# Patient Record
Sex: Female | Born: 1950
Health system: Southern US, Community
[De-identification: ages and names within clinical notes are randomized; demographics above are authoritative.]

## PROBLEM LIST (undated history)

## (undated) DIAGNOSIS — N3946 Mixed incontinence: Secondary | ICD-10-CM

## (undated) DIAGNOSIS — K259 Gastric ulcer, unspecified as acute or chronic, without hemorrhage or perforation: Secondary | ICD-10-CM

## (undated) DIAGNOSIS — R1310 Dysphagia, unspecified: Secondary | ICD-10-CM

## (undated) DIAGNOSIS — F419 Anxiety disorder, unspecified: Secondary | ICD-10-CM

## (undated) DIAGNOSIS — R011 Cardiac murmur, unspecified: Secondary | ICD-10-CM

## (undated) DIAGNOSIS — M4800 Spinal stenosis, site unspecified: Secondary | ICD-10-CM

## (undated) DIAGNOSIS — M1990 Unspecified osteoarthritis, unspecified site: Secondary | ICD-10-CM

## (undated) DIAGNOSIS — Z5189 Encounter for other specified aftercare: Secondary | ICD-10-CM

## (undated) DIAGNOSIS — E7800 Pure hypercholesterolemia, unspecified: Secondary | ICD-10-CM

## (undated) DIAGNOSIS — M5430 Sciatica, unspecified side: Secondary | ICD-10-CM

## (undated) DIAGNOSIS — E785 Hyperlipidemia, unspecified: Secondary | ICD-10-CM

## (undated) DIAGNOSIS — K219 Gastro-esophageal reflux disease without esophagitis: Secondary | ICD-10-CM

## (undated) DIAGNOSIS — I10 Essential (primary) hypertension: Secondary | ICD-10-CM

## (undated) HISTORY — PX: EYE SURGERY: SHX253

## (undated) HISTORY — PX: JOINT REPLACEMENT: SHX530

## (undated) HISTORY — PX: TOTAL HIP ARTHROPLASTY: SHX124

## (undated) HISTORY — DX: Sciatica, unspecified side: M54.30

## (undated) HISTORY — DX: Pure hypercholesterolemia, unspecified: E78.00

## (undated) HISTORY — PX: CARPAL TUNNEL RELEASE: SHX101

## (undated) HISTORY — DX: Hyperlipidemia, unspecified: E78.5

## (undated) HISTORY — DX: Dysphagia, unspecified: R13.10

## (undated) HISTORY — PX: OTHER SURGICAL HISTORY: SHX169

## (undated) HISTORY — DX: Mixed incontinence: N39.46

## (undated) HISTORY — DX: Encounter for other specified aftercare: Z51.89

## (undated) HISTORY — DX: Unspecified osteoarthritis, unspecified site: M19.90

---

## 1980-03-13 DIAGNOSIS — Z5189 Encounter for other specified aftercare: Secondary | ICD-10-CM

## 1980-03-13 HISTORY — DX: Encounter for other specified aftercare: Z51.89

## 1999-01-06 ENCOUNTER — Encounter: Payer: Self-pay | Admitting: Obstetrics and Gynecology

## 1999-01-06 ENCOUNTER — Encounter: Admission: RE | Admit: 1999-01-06 | Discharge: 1999-01-06 | Payer: Self-pay | Admitting: Obstetrics and Gynecology

## 2000-01-20 ENCOUNTER — Encounter: Admission: RE | Admit: 2000-01-20 | Discharge: 2000-01-20 | Payer: Self-pay | Admitting: Obstetrics and Gynecology

## 2000-01-20 ENCOUNTER — Encounter: Payer: Self-pay | Admitting: Obstetrics and Gynecology

## 2001-02-04 ENCOUNTER — Encounter: Admission: RE | Admit: 2001-02-04 | Discharge: 2001-02-04 | Payer: Self-pay | Admitting: Obstetrics and Gynecology

## 2001-02-04 ENCOUNTER — Encounter: Payer: Self-pay | Admitting: Obstetrics and Gynecology

## 2002-06-02 ENCOUNTER — Encounter: Payer: Self-pay | Admitting: Obstetrics and Gynecology

## 2002-06-02 ENCOUNTER — Encounter: Admission: RE | Admit: 2002-06-02 | Discharge: 2002-06-02 | Payer: Self-pay | Admitting: Obstetrics and Gynecology

## 2003-06-10 ENCOUNTER — Encounter: Admission: RE | Admit: 2003-06-10 | Discharge: 2003-06-10 | Payer: Self-pay | Admitting: Family Medicine

## 2004-08-02 ENCOUNTER — Encounter: Admission: RE | Admit: 2004-08-02 | Discharge: 2004-08-02 | Payer: Self-pay | Admitting: Family Medicine

## 2005-08-10 ENCOUNTER — Encounter: Admission: RE | Admit: 2005-08-10 | Discharge: 2005-08-10 | Payer: Self-pay | Admitting: Family Medicine

## 2006-08-13 ENCOUNTER — Encounter: Admission: RE | Admit: 2006-08-13 | Discharge: 2006-08-13 | Payer: Self-pay | Admitting: Family Medicine

## 2006-12-03 ENCOUNTER — Ambulatory Visit: Payer: Self-pay | Admitting: Internal Medicine

## 2006-12-19 ENCOUNTER — Other Ambulatory Visit: Admission: RE | Admit: 2006-12-19 | Discharge: 2006-12-19 | Payer: Self-pay | Admitting: Obstetrics and Gynecology

## 2006-12-25 ENCOUNTER — Ambulatory Visit: Payer: Self-pay | Admitting: Internal Medicine

## 2007-08-14 ENCOUNTER — Encounter: Admission: RE | Admit: 2007-08-14 | Discharge: 2007-08-14 | Payer: Self-pay | Admitting: Family Medicine

## 2007-08-16 ENCOUNTER — Ambulatory Visit (HOSPITAL_COMMUNITY): Admission: RE | Admit: 2007-08-16 | Discharge: 2007-08-16 | Payer: Self-pay | Admitting: Family Medicine

## 2008-02-20 ENCOUNTER — Encounter: Admission: RE | Admit: 2008-02-20 | Discharge: 2008-02-20 | Payer: Self-pay | Admitting: Orthopedic Surgery

## 2008-08-31 ENCOUNTER — Encounter: Admission: RE | Admit: 2008-08-31 | Discharge: 2008-08-31 | Payer: Self-pay | Admitting: Family Medicine

## 2009-01-11 ENCOUNTER — Other Ambulatory Visit: Admission: RE | Admit: 2009-01-11 | Discharge: 2009-01-11 | Payer: Self-pay | Admitting: Obstetrics and Gynecology

## 2009-01-12 ENCOUNTER — Encounter: Admission: RE | Admit: 2009-01-12 | Discharge: 2009-01-12 | Payer: Self-pay | Admitting: Orthopedic Surgery

## 2009-09-01 ENCOUNTER — Encounter: Admission: RE | Admit: 2009-09-01 | Discharge: 2009-09-01 | Payer: Self-pay | Admitting: Family Medicine

## 2010-03-10 ENCOUNTER — Other Ambulatory Visit
Admission: RE | Admit: 2010-03-10 | Discharge: 2010-03-10 | Payer: Self-pay | Source: Home / Self Care | Admitting: Obstetrics and Gynecology

## 2010-04-03 ENCOUNTER — Encounter: Payer: Self-pay | Admitting: Family Medicine

## 2010-10-24 ENCOUNTER — Other Ambulatory Visit: Payer: Self-pay | Admitting: Family Medicine

## 2010-10-24 DIAGNOSIS — Z1231 Encounter for screening mammogram for malignant neoplasm of breast: Secondary | ICD-10-CM

## 2010-11-10 ENCOUNTER — Ambulatory Visit
Admission: RE | Admit: 2010-11-10 | Discharge: 2010-11-10 | Disposition: A | Discharge status: Home / Self Care | Payer: 59 | Source: Ambulatory Visit | Attending: Family Medicine | Admitting: Family Medicine

## 2010-11-10 DIAGNOSIS — Z1231 Encounter for screening mammogram for malignant neoplasm of breast: Secondary | ICD-10-CM

## 2011-01-26 DIAGNOSIS — E669 Obesity, unspecified: Secondary | ICD-10-CM | POA: Insufficient documentation

## 2011-06-09 DIAGNOSIS — M1611 Unilateral primary osteoarthritis, right hip: Secondary | ICD-10-CM | POA: Insufficient documentation

## 2011-08-30 DIAGNOSIS — Z471 Aftercare following joint replacement surgery: Secondary | ICD-10-CM | POA: Insufficient documentation

## 2011-11-27 ENCOUNTER — Encounter: Payer: Self-pay | Admitting: Internal Medicine

## 2011-12-04 ENCOUNTER — Other Ambulatory Visit: Payer: Self-pay | Admitting: Family Medicine

## 2011-12-04 DIAGNOSIS — Z1231 Encounter for screening mammogram for malignant neoplasm of breast: Secondary | ICD-10-CM

## 2011-12-25 ENCOUNTER — Encounter: Payer: Self-pay | Admitting: Internal Medicine

## 2012-01-02 ENCOUNTER — Ambulatory Visit
Admission: RE | Admit: 2012-01-02 | Discharge: 2012-01-02 | Disposition: A | Discharge status: Home / Self Care | Payer: 59 | Source: Ambulatory Visit | Attending: Family Medicine | Admitting: Family Medicine

## 2012-01-02 DIAGNOSIS — Z1231 Encounter for screening mammogram for malignant neoplasm of breast: Secondary | ICD-10-CM

## 2012-01-23 ENCOUNTER — Ambulatory Visit (AMBULATORY_SURGERY_CENTER): Payer: 59 | Admitting: *Deleted

## 2012-01-23 ENCOUNTER — Encounter: Payer: Self-pay | Admitting: Internal Medicine

## 2012-01-23 VITALS — Ht 63.0 in | Wt 176.4 lb

## 2012-01-23 DIAGNOSIS — Z1211 Encounter for screening for malignant neoplasm of colon: Secondary | ICD-10-CM

## 2012-01-23 MED ORDER — MOVIPREP 100 G PO SOLR: ORAL | Status: DC

## 2012-02-13 ENCOUNTER — Encounter: Payer: Self-pay | Admitting: Internal Medicine

## 2012-02-13 ENCOUNTER — Ambulatory Visit (AMBULATORY_SURGERY_CENTER): Payer: 59 | Admitting: Internal Medicine

## 2012-02-13 VITALS — BP 126/64 | HR 65 | Temp 98.2°F | Resp 18 | Ht 66.0 in | Wt 176.0 lb

## 2012-02-13 DIAGNOSIS — Z800 Family history of malignant neoplasm of digestive organs: Secondary | ICD-10-CM

## 2012-02-13 DIAGNOSIS — Z1211 Encounter for screening for malignant neoplasm of colon: Secondary | ICD-10-CM

## 2012-02-13 MED ORDER — SODIUM CHLORIDE 0.9 % IV SOLN: 500.0000 mL | INTRAVENOUS | Status: DC

## 2012-02-13 NOTE — Progress Notes (Signed)
Awake Stable To PACU report to RN

## 2012-02-13 NOTE — Patient Instructions (Signed)
HIGH FIBER DIET  YOU HAD AN ENDOSCOPIC PROCEDURE TODAY AT THE [facility]: Refer to the procedure report that was given to you for any specific questions about what was found during the examination.  If the procedure report does not answer your questions, please call your gastroenterologist to clarify.  If you requested that your care partner not be given the details of your procedure findings, then the procedure report has been included in a sealed envelope for you to review at your convenience later.  YOU SHOULD EXPECT: Some feelings of bloating in the abdomen. Passage of more gas than usual.  Walking can help get rid of the air that was put into your GI tract during the procedure and reduce the bloating. If you had a lower endoscopy (such as a colonoscopy or flexible sigmoidoscopy) you may notice spotting of blood in your stool or on the toilet paper. If you underwent a bowel prep for your procedure, then you may not have a normal bowel movement for a few days.  DIET: Your first meal following the procedure should be a light meal and then it is ok to progress to your normal diet.  A half-sandwich or bowl of soup is an example of a good first meal.  Heavy or fried foods are harder to digest and may make you feel nauseous or bloated.  Likewise meals heavy in dairy and vegetables can cause extra gas to form and this can also increase the bloating.  Drink plenty of fluids but you should avoid alcoholic beverages for 24 hours.  ACTIVITY: Your care partner should take you home directly after the procedure.  You should plan to take it easy, moving slowly for the rest of the day.  You can resume normal activity the day after the procedure however you should NOT DRIVE or use heavy machinery for 24 hours (because of the sedation medicines used during the test).    SYMPTOMS TO REPORT IMMEDIATELY: A gastroenterologist can be reached at any hour.  During normal business hours, 8:30 AM to 5:00 PM Monday  through Friday, call [phone].  After hours and on weekends, please call the GI answering service at [phone] who will take a message and have the physician on call contact you.   Following lower endoscopy (colonoscopy or flexible sigmoidoscopy):  Excessive amounts of blood in the stool  Significant tenderness or worsening of abdominal pains  Swelling of the abdomen that is new, acute  Fever of 100F or higher FOLLOW UP: If any biopsies were taken you will be contacted by phone or by letter within the next 1-3 weeks.  Call your gastroenterologist if you have not heard about the biopsies in 3 weeks.  Our staff will call the home number listed on your records the next business day following your procedure to check on you and address any questions or concerns that you may have at that time regarding the information given to you following your procedure. This is a courtesy call and so if there is no answer at the home number and we have not heard from you through the emergency physician on call, we will assume that you have returned to your regular daily activities without incident.  SIGNATURES/CONFIDENTIALITY: You and/or your care partner have signed paperwork which will be entered into your electronic medical record.  These signatures attest to the fact that that the information above on your After Visit Summary has been reviewed and is understood.  Full responsibility of the   confidentiality of this discharge information lies with you and/or your care-partner.  
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
## 2012-02-13 NOTE — Op Note (Signed)
[facility] [address] [place], [address]   COLONOSCOPY PROCEDURE REPORT  PATIENT: Cynthia Cochran, Cynthia Cochran  MR#: [id number] BIRTHDATE: 13-Apr-1950 , 61  yrs. old GENDER: Female ENDOSCOPIST: Hart Carwin, MD REFERRED BY:  Oval Linsey, M.D. PROCEDURE DATE:  02/13/2012 PROCEDURE:   Colonoscopy, screening ASA CLASS:   Class II INDICATIONS:Patient's immediate family history of colon cancer and mother and a sibbling withy colon cancer, last exam 2008. MEDICATIONS: MAC sedation, administered by CRNA and propofol (Diprivan) 400mg  IV  DESCRIPTION OF PROCEDURE:   After the risks and benefits and of the procedure were explained, informed consent was obtained.  A digital rectal exam revealed no abnormalities of the rectum.    The LB [id number]  endoscope was introduced through the anus and advanced to the cecum, which was identified by both the appendix and ileocecal valve .  The quality of the prep was good, using MoviPrep .  The instrument was then slowly withdrawn as the colon was fully examined.     COLON FINDINGS: Mild diverticulosis was noted throughout the entire examined colon.     Retroflexed views revealed no abnormalities. The scope was then withdrawn from the patient and the procedure completed.  COMPLICATIONS: There were no complications. ENDOSCOPIC IMPRESSION: Mild diverticulosis was noted throughout the entire examined colon  RECOMMENDATIONS: High fiber diet   REPEAT EXAM: In 5 year(s)  for Colonoscopy.  cc:  _______________________________ eSignedHart Carwin, MD 02/13/2012 11:20 AM

## 2012-02-14 ENCOUNTER — Telehealth: Payer: Self-pay | Admitting: *Deleted

## 2012-02-14 NOTE — Telephone Encounter (Signed)
  Follow up Call-  Call back number 02/13/2012  Post procedure Call Back phone  # [phone]  Permission to leave phone message Yes     Patient questions:  Do you have a fever, pain , or abdominal swelling? no Pain Score  0 *  Have you tolerated food without any problems? yes  Have you been able to return to your normal activities? yes  Do you have any questions about your discharge instructions: Diet   no Medications  no Follow up visit  no  Do you have questions or concerns about your Care? no  Actions: * If pain score is 4 or above: No action needed, pain <4.

## 2013-01-29 ENCOUNTER — Other Ambulatory Visit: Payer: Self-pay

## 2013-01-29 DIAGNOSIS — Z1231 Encounter for screening mammogram for malignant neoplasm of breast: Secondary | ICD-10-CM

## 2013-03-03 ENCOUNTER — Ambulatory Visit
Admission: RE | Admit: 2013-03-03 | Discharge: 2013-03-03 | Disposition: A | Discharge status: Home / Self Care | Payer: 59 | Source: Ambulatory Visit

## 2013-03-03 DIAGNOSIS — Z1231 Encounter for screening mammogram for malignant neoplasm of breast: Secondary | ICD-10-CM

## 2013-08-14 ENCOUNTER — Other Ambulatory Visit: Payer: Self-pay | Admitting: Sports Medicine

## 2013-08-14 DIAGNOSIS — M545 Low back pain, unspecified: Secondary | ICD-10-CM

## 2013-08-22 ENCOUNTER — Other Ambulatory Visit: Payer: 59

## 2013-09-02 ENCOUNTER — Ambulatory Visit
Admission: RE | Admit: 2013-09-02 | Discharge: 2013-09-02 | Disposition: A | Discharge status: Home / Self Care | Payer: 59 | Source: Ambulatory Visit | Attending: Sports Medicine | Admitting: Sports Medicine

## 2013-09-02 DIAGNOSIS — M545 Low back pain, unspecified: Secondary | ICD-10-CM

## 2014-05-19 ENCOUNTER — Other Ambulatory Visit: Payer: Self-pay

## 2014-05-19 DIAGNOSIS — Z1231 Encounter for screening mammogram for malignant neoplasm of breast: Secondary | ICD-10-CM

## 2014-06-03 ENCOUNTER — Ambulatory Visit: Payer: Self-pay

## 2014-06-08 ENCOUNTER — Ambulatory Visit
Admission: RE | Admit: 2014-06-08 | Discharge: 2014-06-08 | Disposition: A | Discharge status: Home / Self Care | Payer: 59 | Source: Ambulatory Visit

## 2014-06-08 ENCOUNTER — Other Ambulatory Visit: Payer: Self-pay

## 2014-06-08 DIAGNOSIS — Z1231 Encounter for screening mammogram for malignant neoplasm of breast: Secondary | ICD-10-CM

## 2015-06-28 ENCOUNTER — Other Ambulatory Visit: Payer: Self-pay

## 2015-06-28 DIAGNOSIS — Z1231 Encounter for screening mammogram for malignant neoplasm of breast: Secondary | ICD-10-CM

## 2015-07-15 ENCOUNTER — Ambulatory Visit
Admission: RE | Admit: 2015-07-15 | Discharge: 2015-07-15 | Disposition: A | Discharge status: Home / Self Care | Payer: 59 | Source: Ambulatory Visit

## 2015-07-15 DIAGNOSIS — Z1231 Encounter for screening mammogram for malignant neoplasm of breast: Secondary | ICD-10-CM

## 2015-09-09 ENCOUNTER — Ambulatory Visit (INDEPENDENT_AMBULATORY_CARE_PROVIDER_SITE_OTHER): Payer: 59 | Admitting: Adult Health

## 2015-09-09 ENCOUNTER — Other Ambulatory Visit (HOSPITAL_COMMUNITY)
Admission: RE | Admit: 2015-09-09 | Discharge: 2015-09-09 | Disposition: A | Discharge status: Home / Self Care | Payer: 59 | Source: Ambulatory Visit | Attending: Adult Health | Admitting: Adult Health

## 2015-09-09 ENCOUNTER — Encounter: Payer: Self-pay | Admitting: Adult Health

## 2015-09-09 VITALS — BP 120/80 | HR 58 | Ht 62.25 in | Wt 159.0 lb

## 2015-09-09 DIAGNOSIS — Z01419 Encounter for gynecological examination (general) (routine) without abnormal findings: Secondary | ICD-10-CM

## 2015-09-09 DIAGNOSIS — Z1151 Encounter for screening for human papillomavirus (HPV): Secondary | ICD-10-CM | POA: Insufficient documentation

## 2015-09-09 DIAGNOSIS — Z01411 Encounter for gynecological examination (general) (routine) with abnormal findings: Secondary | ICD-10-CM | POA: Diagnosis not present

## 2015-09-09 DIAGNOSIS — Z1211 Encounter for screening for malignant neoplasm of colon: Secondary | ICD-10-CM | POA: Diagnosis not present

## 2015-09-09 DIAGNOSIS — N3946 Mixed incontinence: Secondary | ICD-10-CM | POA: Diagnosis not present

## 2015-09-09 HISTORY — DX: Mixed incontinence: N39.46

## 2015-09-09 LAB — HEMOCCULT GUIAC POC 1CARD (OFFICE): Fecal Occult Blood, POC: NEGATIVE

## 2015-09-09 NOTE — Patient Instructions (Signed)
Physical in 1 year,pap in 3 if normal Mammogram yearly Labs with PCP Colonoscopy per GI Kegel Exercises The goal of Kegel exercises is to isolate and exercise your pelvic floor muscles. These muscles act as a hammock that supports the rectum, vagina, small intestine, and uterus. As the muscles weaken, the hammock sags and these organs are displaced from their normal positions. Kegel exercises can strengthen your pelvic floor muscles and help you to improve bladder and bowel control, improve sexual response, and help reduce many problems and some discomfort during pregnancy. Kegel exercises can be done anywhere and at any time. HOW TO PERFORM KEGEL EXERCISES 1. Locate your pelvic floor muscles. To do this, squeeze (contract) the muscles that you use when you try to stop the flow of urine. You will feel a tightness in the vaginal area (women) and a tight lift in the rectal area (men and women). 2. When you begin, contract your pelvic muscles tight for 2-5 seconds, then relax them for 2-5 seconds. This is one set. Do 4-5 sets with a short pause in between. 3. Contract your pelvic muscles for 8-10 seconds, then relax them for 8-10 seconds. Do 4-5 sets. If you cannot contract your pelvic muscles for 8-10 seconds, try 5-7 seconds and work your way up to 8-10 seconds. Your goal is 4-5 sets of 10 contractions each day. Keep your stomach, buttocks, and legs relaxed during the exercises. Perform sets of both short and long contractions. Vary your positions. Perform these contractions 3-4 times per day. Perform sets while you are:   Lying in bed in the morning.  Standing at lunch.  Sitting in the late afternoon.  Lying in bed at night. You should do 40-50 contractions per day. Do not perform more Kegel exercises per day than recommended. Overexercising can cause muscle fatigue. Continue these exercises for for at least 15-20 weeks or as directed by your caregiver.   This information is not intended to  replace advice given to you by your health care provider. Make sure you discuss any questions you have with your health care provider.   Document Released: 02/14/2012 Document Revised: 03/20/2014 Document Reviewed: 02/14/2012 Elsevier Interactive Patient Education [employer]2016 Elsevier Inc.

## 2015-09-09 NOTE — Progress Notes (Signed)
Patient ID: Cynthia HumbleKathy K Cochran, female   DOB: 05/27/1950, 65 y.o.   MRN: [id number] History of Present Illness: Cynthia Cochran is a 65 year old white female, widowed, in for a well woman gyn exam and pap.She has some stress and urge incontinence at times, not every day. PCP is Dr Janna ArchonDiego.  Current Medications, Allergies, Past Medical History, Past Surgical History, Family History and Social History were reviewed in [facility] electronic medical record.     Review of Systems: Patient denies any headaches, hearing loss, fatigue, blurred vision, shortness of breath, chest pain, abdominal pain, problems with bowel movements,  or intercourse(nothaving sex).  No joint pain or mood swings. See HPI for positives.   Physical Exam:BP 120/80 mmHg  Pulse 58  Ht 5' 2.25" (1.581 m)  Wt 159 lb (72.122 kg)  BMI 28.85 kg/m2 General:  Well developed, well nourished, no acute distress Skin:  Warm and dry Neck:  Midline trachea, normal thyroid, good ROM, no lymphadenopathy,no carotid bruits heard Lungs; Clear to auscultation bilaterally Breast:  No dominant palpable mass, retraction, or nipple discharge Cardiovascular: Regular rate and rhythm Abdomen:  Soft, non tender, no hepatosplenomegaly Pelvic:  External genitalia is normal in appearance, no lesions.  The vagina is pale with decreased moisture and rugae. Urethra has no lesions or masses. The cervix is smooth, pap with HPV performed.  Uterus is felt to be normal size, shape, and contour.  No adnexal masses or tenderness noted.Bladder is non tender, no masses felt. Rectal: Good sphincter tone, no polyps, or hemorrhoids felt.  Hemoccult negative. Extremities/musculoskeletal:  No swelling or varicosities noted, no clubbing or cyanosis Psych:  No mood changes, alert and cooperative,seems happy   Impression: Well woman gyn exam and pap Mixed UI    Plan: Do kegels,review handout on kegels Physical in 1 year, pap in 3 if normal Mammogram yearly Colonoscopy  per GI Labs with PCP

## 2015-09-10 LAB — CYTOLOGY - PAP

## 2015-11-04 ENCOUNTER — Encounter (HOSPITAL_COMMUNITY): Payer: Self-pay | Admitting: Emergency Medicine

## 2015-11-04 ENCOUNTER — Emergency Department (HOSPITAL_COMMUNITY)
Admission: EM | Admit: 2015-11-04 | Discharge: 2015-11-04 | Disposition: A | Discharge status: Home / Self Care | Payer: 59 | Attending: Emergency Medicine | Admitting: Emergency Medicine

## 2015-11-04 ENCOUNTER — Emergency Department (HOSPITAL_COMMUNITY): Payer: 59

## 2015-11-04 DIAGNOSIS — M549 Dorsalgia, unspecified: Secondary | ICD-10-CM | POA: Insufficient documentation

## 2015-11-04 DIAGNOSIS — Y929 Unspecified place or not applicable: Secondary | ICD-10-CM | POA: Diagnosis not present

## 2015-11-04 DIAGNOSIS — S161XXA Strain of muscle, fascia and tendon at neck level, initial encounter: Secondary | ICD-10-CM | POA: Diagnosis not present

## 2015-11-04 DIAGNOSIS — Z7982 Long term (current) use of aspirin: Secondary | ICD-10-CM | POA: Insufficient documentation

## 2015-11-04 DIAGNOSIS — Y939 Activity, unspecified: Secondary | ICD-10-CM | POA: Diagnosis not present

## 2015-11-04 DIAGNOSIS — Y999 Unspecified external cause status: Secondary | ICD-10-CM | POA: Diagnosis not present

## 2015-11-04 DIAGNOSIS — Z79899 Other long term (current) drug therapy: Secondary | ICD-10-CM | POA: Diagnosis not present

## 2015-11-04 DIAGNOSIS — S199XXA Unspecified injury of neck, initial encounter: Secondary | ICD-10-CM | POA: Diagnosis present

## 2015-11-04 HISTORY — DX: Anxiety disorder, unspecified: F41.9

## 2015-11-04 MED ORDER — METHOCARBAMOL 500 MG PO TABS: 500.0000 mg | ORAL_TABLET | Freq: Two times a day (BID) | ORAL | 0 refills | Status: DC

## 2015-11-04 MED ORDER — NAPROXEN 500 MG PO TABS
500.0000 mg | ORAL_TABLET | Freq: Two times a day (BID) | ORAL | 0 refills | Status: DC
Start: 2015-11-04 — End: 2016-04-25

## 2015-11-04 NOTE — ED Notes (Signed)
Patient with no complaints at this time. Respirations even and unlabored. Skin warm/dry. Discharge instructions reviewed with patient at this time. Patient given opportunity to voice concerns/ask questions. Patient discharged at this time and left Emergency Department with steady gait.   
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
## 2015-11-04 NOTE — ED Notes (Signed)
[facility].

## 2016-03-20 DIAGNOSIS — E784 Other hyperlipidemia: Secondary | ICD-10-CM | POA: Diagnosis not present

## 2016-03-20 DIAGNOSIS — D510 Vitamin B12 deficiency anemia due to intrinsic factor deficiency: Secondary | ICD-10-CM | POA: Diagnosis not present

## 2016-03-20 DIAGNOSIS — E559 Vitamin D deficiency, unspecified: Secondary | ICD-10-CM | POA: Diagnosis not present

## 2016-03-20 DIAGNOSIS — I110 Hypertensive heart disease with heart failure: Secondary | ICD-10-CM | POA: Diagnosis not present

## 2016-04-04 DIAGNOSIS — E559 Vitamin D deficiency, unspecified: Secondary | ICD-10-CM | POA: Diagnosis not present

## 2016-04-04 DIAGNOSIS — E784 Other hyperlipidemia: Secondary | ICD-10-CM | POA: Diagnosis not present

## 2016-04-04 DIAGNOSIS — K219 Gastro-esophageal reflux disease without esophagitis: Secondary | ICD-10-CM | POA: Diagnosis not present

## 2016-04-04 DIAGNOSIS — R5382 Chronic fatigue, unspecified: Secondary | ICD-10-CM | POA: Diagnosis not present

## 2016-04-11 ENCOUNTER — Encounter (INDEPENDENT_AMBULATORY_CARE_PROVIDER_SITE_OTHER): Payer: Self-pay | Admitting: Internal Medicine

## 2016-04-11 ENCOUNTER — Encounter (INDEPENDENT_AMBULATORY_CARE_PROVIDER_SITE_OTHER): Payer: Self-pay

## 2016-04-24 DIAGNOSIS — H02834 Dermatochalasis of left upper eyelid: Secondary | ICD-10-CM | POA: Diagnosis not present

## 2016-04-24 DIAGNOSIS — H02132 Senile ectropion of right lower eyelid: Secondary | ICD-10-CM | POA: Diagnosis not present

## 2016-04-24 DIAGNOSIS — H0279 Other degenerative disorders of eyelid and periocular area: Secondary | ICD-10-CM | POA: Diagnosis not present

## 2016-04-24 DIAGNOSIS — H02831 Dermatochalasis of right upper eyelid: Secondary | ICD-10-CM | POA: Diagnosis not present

## 2016-04-24 DIAGNOSIS — H02135 Senile ectropion of left lower eyelid: Secondary | ICD-10-CM | POA: Diagnosis not present

## 2016-04-24 DIAGNOSIS — H53483 Generalized contraction of visual field, bilateral: Secondary | ICD-10-CM | POA: Diagnosis not present

## 2016-04-24 DIAGNOSIS — H02413 Mechanical ptosis of bilateral eyelids: Secondary | ICD-10-CM | POA: Diagnosis not present

## 2016-04-24 DIAGNOSIS — H02423 Myogenic ptosis of bilateral eyelids: Secondary | ICD-10-CM | POA: Diagnosis not present

## 2016-04-25 ENCOUNTER — Ambulatory Visit (INDEPENDENT_AMBULATORY_CARE_PROVIDER_SITE_OTHER): Payer: Medicare Other | Admitting: Internal Medicine

## 2016-04-25 ENCOUNTER — Encounter (INDEPENDENT_AMBULATORY_CARE_PROVIDER_SITE_OTHER): Payer: Self-pay | Admitting: Internal Medicine

## 2016-04-25 ENCOUNTER — Encounter (INDEPENDENT_AMBULATORY_CARE_PROVIDER_SITE_OTHER): Payer: Self-pay | Admitting: *Deleted

## 2016-04-25 ENCOUNTER — Other Ambulatory Visit (INDEPENDENT_AMBULATORY_CARE_PROVIDER_SITE_OTHER): Payer: Self-pay | Admitting: Internal Medicine

## 2016-04-25 VITALS — BP 160/94 | HR 98 | Temp 97.6°F | Ht 62.0 in | Wt 153.5 lb

## 2016-04-25 DIAGNOSIS — R1319 Other dysphagia: Secondary | ICD-10-CM

## 2016-04-25 DIAGNOSIS — R1310 Dysphagia, unspecified: Secondary | ICD-10-CM | POA: Diagnosis not present

## 2016-04-25 DIAGNOSIS — E7800 Pure hypercholesterolemia, unspecified: Secondary | ICD-10-CM

## 2016-04-25 HISTORY — DX: Pure hypercholesterolemia, unspecified: E78.00

## 2016-04-25 HISTORY — DX: Dysphagia, unspecified: R13.10

## 2016-04-25 NOTE — Patient Instructions (Signed)
The risks and benefits such as perforation, bleeding, and infection were reviewed with the patient and is agreeable. 

## 2016-04-25 NOTE — Progress Notes (Signed)
   Subjective:    Patient ID: Cynthia Cochran, female    DOB: 04/13/1950, 66 y.o.   MRN: [id number]  HPI Referred by Dr. Janna Archondiego for dysphagia. Symptoms for about 2 months. Feels like foods are slow to go down.  If she drinks a coke and takes a large swallow, it burns. The Protonix has helped with the pain. No foods in particular bother her. There is no abdominal pain. She does burp frequently. She has a BM x 1 a day with stool softener or a gentle laxative every few weeks. Family hx of colon cancer in Father and Brother. Her last was 4 yrs ago at [facility] and was normal.  Denies any hx of GERD.  Review of Systems    Past Medical History:  Diagnosis Date  . Anxiety   . Arthritis   . Blood transfusion without reported diagnosis 1982   after c section  . Dysphagia 04/25/2016  . High cholesterol 04/25/2016  . Hyperlipidemia   . Mixed stress and urge urinary incontinence 09/09/2015  . Sciatic nerve pain     Past Surgical History:  Procedure Laterality Date  . CARPAL TUNNEL RELEASE     right; 1998, left 1998  . CESAREAN SECTION  1982  . TOTAL HIP ARTHROPLASTY     right; March 2013; left March 2011    No Known Allergies  Current Outpatient Prescriptions on File Prior to Visit  Medication Sig Dispense Refill  . aspirin 81 MG tablet Take 81 mg by mouth daily.    Marland Kitchen. atorvastatin (LIPITOR) 80 MG tablet Take 80 mg by mouth daily.     . Choline Fenofibrate (FENOFIBRIC ACID) 135 MG CPDR daily.     Marland Kitchen. ezetimibe (ZETIA) 10 MG tablet Take 10 mg by mouth daily.    Marland Kitchen. LORazepam (ATIVAN) 2 MG tablet Take 2 mg by mouth at bedtime.  2  . Multiple Vitamin (MULTIVITAMIN) tablet Take 1 tablet by mouth daily.     No current facility-administered medications on file prior to visit.       Objective:   Physical Exam Blood pressure (!) 160/94, pulse 98, temperature 97.6 F (36.4 C), height 5\' 2"  (1.575 m), weight 153 lb 8 oz (69.6 kg). Alert and oriented. Skin warm and dry. Oral mucosa is moist.    . Sclera anicteric, conjunctivae is pink. Thyroid not enlarged. No cervical lymphadenopathy. Lungs clear. Heart regular rate and rhythm.  Abdomen is soft. Bowel sounds are positive. No hepatomegaly. No abdominal masses felt. No tenderness.  No edema to lower extremities.        Assessment & Plan:  Dysphagia. Esophageal stricture,web needs to be ruled out.  EGD/ED.

## 2016-05-12 ENCOUNTER — Ambulatory Visit (HOSPITAL_COMMUNITY)
Admission: RE | Admit: 2016-05-12 | Discharge: 2016-05-12 | Disposition: A | Discharge status: Home / Self Care | Payer: Medicare Other | Source: Ambulatory Visit | Attending: Internal Medicine | Admitting: Internal Medicine

## 2016-05-12 ENCOUNTER — Encounter (HOSPITAL_COMMUNITY)
Admission: RE | Disposition: A | Discharge status: Home / Self Care | Payer: Self-pay | Source: Ambulatory Visit | Attending: Internal Medicine

## 2016-05-12 ENCOUNTER — Encounter (HOSPITAL_COMMUNITY): Payer: Self-pay | Admitting: *Deleted

## 2016-05-12 DIAGNOSIS — F419 Anxiety disorder, unspecified: Secondary | ICD-10-CM | POA: Insufficient documentation

## 2016-05-12 DIAGNOSIS — E785 Hyperlipidemia, unspecified: Secondary | ICD-10-CM | POA: Diagnosis not present

## 2016-05-12 DIAGNOSIS — E7800 Pure hypercholesterolemia, unspecified: Secondary | ICD-10-CM | POA: Insufficient documentation

## 2016-05-12 DIAGNOSIS — K449 Diaphragmatic hernia without obstruction or gangrene: Secondary | ICD-10-CM | POA: Diagnosis not present

## 2016-05-12 DIAGNOSIS — Z7982 Long term (current) use of aspirin: Secondary | ICD-10-CM | POA: Diagnosis not present

## 2016-05-12 DIAGNOSIS — Z96643 Presence of artificial hip joint, bilateral: Secondary | ICD-10-CM | POA: Diagnosis not present

## 2016-05-12 DIAGNOSIS — K2900 Acute gastritis without bleeding: Secondary | ICD-10-CM | POA: Diagnosis not present

## 2016-05-12 DIAGNOSIS — R1314 Dysphagia, pharyngoesophageal phase: Secondary | ICD-10-CM | POA: Diagnosis not present

## 2016-05-12 DIAGNOSIS — R1310 Dysphagia, unspecified: Secondary | ICD-10-CM

## 2016-05-12 DIAGNOSIS — R1319 Other dysphagia: Secondary | ICD-10-CM | POA: Insufficient documentation

## 2016-05-12 DIAGNOSIS — K3189 Other diseases of stomach and duodenum: Secondary | ICD-10-CM | POA: Diagnosis not present

## 2016-05-12 DIAGNOSIS — K259 Gastric ulcer, unspecified as acute or chronic, without hemorrhage or perforation: Secondary | ICD-10-CM | POA: Diagnosis not present

## 2016-05-12 DIAGNOSIS — Z79899 Other long term (current) drug therapy: Secondary | ICD-10-CM | POA: Insufficient documentation

## 2016-05-12 DIAGNOSIS — K2950 Unspecified chronic gastritis without bleeding: Secondary | ICD-10-CM | POA: Diagnosis not present

## 2016-05-12 HISTORY — PX: ESOPHAGEAL DILATION: SHX303

## 2016-05-12 HISTORY — PX: BIOPSY: SHX5522

## 2016-05-12 HISTORY — PX: ESOPHAGOGASTRODUODENOSCOPY: SHX5428

## 2016-05-12 SURGERY — EGD (ESOPHAGOGASTRODUODENOSCOPY)
Anesthesia: Moderate Sedation

## 2016-05-12 MED ORDER — MEPERIDINE HCL 50 MG/ML IJ SOLN
INTRAMUSCULAR | Status: DC | PRN
  Administered 2016-05-12 (×2): 25 mg via INTRAVENOUS

## 2016-05-12 MED ORDER — LIDOCAINE VISCOUS 2 % MT SOLN
OROMUCOSAL | Status: AC
  Filled 2016-05-12: qty 15

## 2016-05-12 MED ORDER — LIDOCAINE VISCOUS 2 % MT SOLN
OROMUCOSAL | Status: DC | PRN
  Administered 2016-05-12: 5 mL via OROMUCOSAL

## 2016-05-12 MED ORDER — STERILE WATER FOR IRRIGATION IR SOLN
Status: DC | PRN
  Administered 2016-05-12: 10:00:00

## 2016-05-12 MED ORDER — MEPERIDINE HCL 50 MG/ML IJ SOLN
INTRAMUSCULAR | Status: AC
  Filled 2016-05-12: qty 1

## 2016-05-12 MED ORDER — MIDAZOLAM HCL 5 MG/5ML IJ SOLN
INTRAMUSCULAR | Status: DC | PRN
  Administered 2016-05-12 (×6): 2 mg via INTRAVENOUS

## 2016-05-12 MED ORDER — SODIUM CHLORIDE 0.9 % IV SOLN
INTRAVENOUS | Status: DC
  Administered 2016-05-12: 09:00:00 via INTRAVENOUS

## 2016-05-12 MED ORDER — MIDAZOLAM HCL 5 MG/5ML IJ SOLN
INTRAMUSCULAR | Status: AC
  Filled 2016-05-12: qty 5

## 2016-05-12 MED ORDER — MIDAZOLAM HCL 5 MG/5ML IJ SOLN
INTRAMUSCULAR | Status: AC
  Filled 2016-05-12: qty 10

## 2016-05-12 NOTE — Op Note (Signed)
[facility] Patient Name: Cynthia Cochran Procedure Date: 05/12/2016 9:23 AM MRN: [id number] Date of Birth: 05-05-1950 Attending MD: Lionel December , MD CSN: [id number] Age: 66 Admit Type: Outpatient Procedure:                Upper GI endoscopy Indications:              Esophageal dysphagia Providers:                Lionel December, MD, Criselda Peaches. Patsy Lager, RN, Edrick Kins, RN Referring MD:             Duke Salvia. Dondiego, MD Medicines:                Viscous Lidocaine for topical anesthesia,                            Meperidine 50 mg IV, Midazolam 12 mg IV Complications:            No immediate complications. Estimated Blood Loss:     Estimated blood loss was minimal. Estimated blood                            loss was minimal. Procedure:                Pre-Anesthesia Assessment:                           - Prior to the procedure, a History and Physical                            was performed, and patient medications and                            allergies were reviewed. The patient's tolerance of                            previous anesthesia was also reviewed. The risks                            and benefits of the procedure and the sedation                            options and risks were discussed with the patient.                            All questions were answered, and informed consent                            was obtained. Prior Anticoagulants: The patient                            last took aspirin 3 days prior to the procedure.  ASA Grade Assessment: II - A patient with mild                            systemic disease. After reviewing the risks and                            benefits, the patient was deemed in satisfactory                            condition to undergo the procedure.                           After obtaining informed consent, the endoscope was                            passed under direct  vision. Throughout the                            procedure, the patient's blood pressure, pulse, and                            oxygen saturations were monitored continuously. The                            EG-[id number] ([id number]) scope was introduced through the                            mouth, and advanced to the second part of duodenum.                            The upper GI endoscopy was accomplished without                            difficulty. The patient tolerated the procedure                            well. Scope In: 9:56:32 AM Scope Out: 10:08:43 AM Total Procedure Duration: 0 hours 12 minutes 11 seconds  Findings:      The examined esophagus was normal.      The Z-line was regular and was found 35 cm from the incisors.      A 2 cm hiatal hernia was present.      A few dispersed erosions were found in the gastric antrum and in the       prepyloric region of the stomach. Biopsies were taken with a cold       forceps for histology.      A healed ulcer was found on the posterior wall of the gastric antrum.      The exam of the stomach was otherwise normal.      The duodenal bulb and second portion of the duodenum were normal.      No endoscopic abnormality was evident in the esophagus to explain the       patient's complaint of dysphagia. It was decided, however, to proceed       with dilation of the entire  esophagus. The scope was withdrawn. Dilation       was performed with a Maloney dilator with no resistance at 54 Fr. The       dilation site was examined following endoscope reinsertion and showed no       change, no bleeding and no mucosal tear. Impression:               - Normal esophagus.                           - Z-line regular, 35 cm from the incisors.                           - 2 cm hiatal hernia.                           - Erosive gastropathy. Biopsied.                           - Scar in the gastric antrum (posterior wall).                           - Esophagus  dilated by passing 54 French Maloney                            dilator but no mucosal disruption noted.                           - Normal duodenal bulb and second portion of the                            duodenum. Moderate Sedation:      Moderate (conscious) sedation was administered by the endoscopy nurse       and supervised by the endoscopist. The following parameters were       monitored: oxygen saturation, heart rate, blood pressure, CO2       capnography and response to care. Total physician intraservice time was       22 minutes. Recommendation:           - Patient has a contact number available for                            emergencies. The signs and symptoms of potential                            delayed complications were discussed with the                            patient. Return to normal activities tomorrow.                            Written discharge instructions were provided to the                            patient.                           -  Resume previous diet today.                           - Continue present medications.                           - Await pathology results. Procedure Code(s):        --- Professional ---                           [id number], Esophagogastroduodenoscopy, flexible,                            transoral; with biopsy, single or multiple                           43450, Dilation of esophagus, by unguided sound or                            bougie, single or multiple passes                           99152, Moderate sedation services provided by the                            same physician or other qualified health care                            professional performing the diagnostic or                            therapeutic service that the sedation supports,                            requiring the presence of an independent trained                            observer to assist in the monitoring of the                             patient's level of consciousness and physiological                            status; initial 15 minutes of intraservice time,                            patient age 87 years or older Diagnosis Code(s):        --- Professional ---                           K44.9, Diaphragmatic hernia without obstruction or                            gangrene  K31.89, Other diseases of stomach and duodenum                           R13.14, Dysphagia, pharyngoesophageal phase CPT copyright 2016 [employer]. All rights reserved. The codes documented in this report are preliminary and upon coder review may  be revised to meet current compliance requirements. Lionel December, MD Lionel December, MD 05/12/2016 10:20:20 AM This report has been signed electronically. Number of Addenda: 0

## 2016-05-12 NOTE — Discharge Instructions (Signed)
Resume usual medications and diet. Take pantoprazole 30 minutes before breakfast and not at bedtime. No driving for 24 hours. Physician will call with biopsy results.  Esophagogastroduodenoscopy, Care After Refer to this sheet in the next few weeks. These instructions provide you with information about caring for yourself after your procedure. Your health care provider may also give you more specific instructions. Your treatment has been planned according to current medical practices, but problems sometimes occur. Call your health care provider if you have any problems or questions after your procedure. What can I expect after the procedure? After the procedure, it is common to have:  A sore throat.  Nausea.  Bloating.  Dizziness.  Fatigue. Follow these instructions at home:  Do not eat or drink anything until the numbing medicine (local anesthetic) has worn off and your gag reflex has returned. You will know that the local anesthetic has worn off when you can swallow comfortably.  Do not drive for 24 hours if you received a medicine to help you relax (sedative).  If your health care provider took a tissue sample for testing during the procedure, make sure to get your test results. This is your responsibility. Ask your health care provider or the department performing the test when your results will be ready.  Keep all follow-up visits as told by your health care provider. This is important. Contact a health care provider if:  You cannot stop coughing.  You are not urinating.  You are urinating less than usual. Get help right away if:  You have trouble swallowing.  You cannot eat or drink.  You have throat or chest pain that gets worse.  You are dizzy or light-headed.  You faint.  You have nausea or vomiting.  You have chills.  You have a fever.  You have severe abdominal pain.  You have black, tarry, or bloody stools. This information is not intended to  replace advice given to you by your health care provider. Make sure you discuss any questions you have with your health care provider. Document Released: 02/14/2012 Document Revised: 08/05/2015 Document Reviewed: 01/21/2015 Elsevier Interactive Patient Education  2017 [employer]

## 2016-05-12 NOTE — H&P (Signed)
Cynthia Cochran is an 66 y.o. female.   Chief Complaint: Patient is here for EGD and ED. HPI: Patient is 66 year old Caucasian female who presents with two-month history of solid food dysphagia. She points to upper sternal areas side of bolus obstruction. She says food takes a while to go down. If she drinks sip of water liquids. Helps facilitate swallowing. She has never had heartburn. She has noted more burping lately. She has been pantoprazole for about a month can tell a difference. She is taking this medication at bedtime. She denies nausea vomiting melena or rectal bleeding. She states she has lost 50 pounds since she lost her husband 18 months ago. Weight loss nor has leveled off. No history of recent antibiotic use and she does not take any medication for bone loss.  Past Medical History:  Diagnosis Date  . Anxiety   . Arthritis   . Blood transfusion without reported diagnosis 1982   after c section    04/25/2016  . High cholesterol 04/25/2016  . Hyperlipidemia   . Mixed stress and urge urinary incontinence 09/09/2015  . Sciatic nerve pain     Past Surgical History:  Procedure Laterality Date  . CARPAL TUNNEL RELEASE     right; 1998, left 1998  . CESAREAN SECTION  1982  . Colonscopy    . TOTAL HIP ARTHROPLASTY     right; March 2013; left March 2011    Family History  Problem Relation Age of Onset  . Colon cancer Father 4164  . Colon cancer Brother 50  . Other Mother     heavy smoker; died from arterial sclerosis  . Hypertension Sister   . Stomach cancer Neg Hx    Social History:  reports that she has never smoked. She has never used smokeless tobacco. She reports that she drinks alcohol. She reports that she does not use drugs.  Allergies: No Known Allergies  Medications Prior to Admission  Medication Sig Dispense Refill  . aspirin 81 MG tablet Take 81 mg by mouth at bedtime.     Marland Kitchen. atorvastatin (LIPITOR) 80 MG tablet Take 80 mg by mouth at bedtime.     . Biotin 1 MG CAPS  Take 1 tablet by mouth at bedtime.     . Cholecalciferol 5000 units TABS Take 5,000 Units by mouth at bedtime.     . Choline Fenofibrate (FENOFIBRIC ACID) 135 MG CPDR Take 1 tablet by mouth at bedtime.     Marland Kitchen. ezetimibe (ZETIA) 10 MG tablet Take 10 mg by mouth at bedtime.     Marland Kitchen. LORazepam (ATIVAN) 2 MG tablet Take 2 mg by mouth at bedtime.  2  . Multiple Vitamin (MULTIVITAMIN) tablet Take 1 tablet by mouth at bedtime.     . pantoprazole (PROTONIX) 40 MG tablet Take 40 mg by mouth at bedtime.       No results found for this or any previous visit (from the past 48 hour(s)). No results found.  ROS  Blood pressure (!) 140/94, pulse 65, temperature 98.3 F (36.8 C), temperature source Oral, resp. rate 19, height 5\' 2"  (1.575 m), weight 153 lb (69.4 kg), SpO2 100 %. Physical Exam  Constitutional: She appears well-developed and well-nourished.  HENT:  Mouth/Throat: Oropharynx is clear and moist.  Eyes: Conjunctivae are normal. No scleral icterus.  Neck: No thyromegaly present.  Cardiovascular: Normal rate, regular rhythm and normal heart sounds.   No murmur heard. Respiratory: Effort normal and breath sounds normal.  GI: Soft. She exhibits  no distension and no mass. There is no tenderness.  Musculoskeletal: She exhibits no edema.  Lymphadenopathy:    She has no cervical adenopathy.  Neurological: She is alert.  Skin: Skin is warm and dry.     Assessment/Plan  Solid food dysphagia. EGD with ED.  Lionel December, MD 05/12/2016, 9:42 AM

## 2016-05-17 ENCOUNTER — Encounter (HOSPITAL_COMMUNITY): Payer: Self-pay | Admitting: Internal Medicine

## 2016-05-18 ENCOUNTER — Other Ambulatory Visit (INDEPENDENT_AMBULATORY_CARE_PROVIDER_SITE_OTHER): Payer: Self-pay | Admitting: *Deleted

## 2016-05-18 DIAGNOSIS — R1319 Other dysphagia: Secondary | ICD-10-CM

## 2016-05-23 ENCOUNTER — Ambulatory Visit (HOSPITAL_COMMUNITY)
Admission: RE | Admit: 2016-05-23 | Discharge: 2016-05-23 | Disposition: A | Discharge status: Home / Self Care | Payer: Medicare Other | Source: Ambulatory Visit | Attending: Internal Medicine | Admitting: Internal Medicine

## 2016-05-23 DIAGNOSIS — R5382 Chronic fatigue, unspecified: Secondary | ICD-10-CM | POA: Diagnosis not present

## 2016-05-23 DIAGNOSIS — R1319 Other dysphagia: Secondary | ICD-10-CM | POA: Diagnosis not present

## 2016-05-23 DIAGNOSIS — E784 Other hyperlipidemia: Secondary | ICD-10-CM | POA: Diagnosis not present

## 2016-05-23 DIAGNOSIS — K224 Dyskinesia of esophagus: Secondary | ICD-10-CM | POA: Insufficient documentation

## 2016-05-23 DIAGNOSIS — J309 Allergic rhinitis, unspecified: Secondary | ICD-10-CM | POA: Diagnosis not present

## 2016-05-23 DIAGNOSIS — E559 Vitamin D deficiency, unspecified: Secondary | ICD-10-CM | POA: Diagnosis not present

## 2016-06-22 DIAGNOSIS — F4323 Adjustment disorder with mixed anxiety and depressed mood: Secondary | ICD-10-CM | POA: Diagnosis not present

## 2016-07-11 DIAGNOSIS — H02413 Mechanical ptosis of bilateral eyelids: Secondary | ICD-10-CM | POA: Diagnosis not present

## 2016-07-11 DIAGNOSIS — H02132 Senile ectropion of right lower eyelid: Secondary | ICD-10-CM | POA: Diagnosis not present

## 2016-07-11 DIAGNOSIS — H02135 Senile ectropion of left lower eyelid: Secondary | ICD-10-CM | POA: Diagnosis not present

## 2016-07-20 DIAGNOSIS — F4323 Adjustment disorder with mixed anxiety and depressed mood: Secondary | ICD-10-CM | POA: Diagnosis not present

## 2016-08-10 DIAGNOSIS — H02115 Cicatricial ectropion of left lower eyelid: Secondary | ICD-10-CM | POA: Diagnosis not present

## 2016-08-10 DIAGNOSIS — H11421 Conjunctival edema, right eye: Secondary | ICD-10-CM | POA: Diagnosis not present

## 2016-08-10 DIAGNOSIS — H0289 Other specified disorders of eyelid: Secondary | ICD-10-CM | POA: Diagnosis not present

## 2016-08-10 DIAGNOSIS — Z09 Encounter for follow-up examination after completed treatment for conditions other than malignant neoplasm: Secondary | ICD-10-CM | POA: Diagnosis not present

## 2016-08-10 DIAGNOSIS — H02125 Mechanical ectropion of left lower eyelid: Secondary | ICD-10-CM | POA: Diagnosis not present

## 2016-08-17 DIAGNOSIS — F4323 Adjustment disorder with mixed anxiety and depressed mood: Secondary | ICD-10-CM | POA: Diagnosis not present

## 2016-08-21 ENCOUNTER — Other Ambulatory Visit: Payer: Self-pay | Admitting: Family Medicine

## 2016-08-21 DIAGNOSIS — Z1231 Encounter for screening mammogram for malignant neoplasm of breast: Secondary | ICD-10-CM

## 2016-09-01 DIAGNOSIS — F4323 Adjustment disorder with mixed anxiety and depressed mood: Secondary | ICD-10-CM | POA: Diagnosis not present

## 2016-09-04 ENCOUNTER — Ambulatory Visit
Admission: RE | Admit: 2016-09-04 | Discharge: 2016-09-04 | Disposition: A | Discharge status: Home / Self Care | Payer: Medicare Other | Source: Ambulatory Visit | Attending: Family Medicine | Admitting: Family Medicine

## 2016-09-04 DIAGNOSIS — Z1231 Encounter for screening mammogram for malignant neoplasm of breast: Secondary | ICD-10-CM

## 2016-10-05 DIAGNOSIS — F4323 Adjustment disorder with mixed anxiety and depressed mood: Secondary | ICD-10-CM | POA: Diagnosis not present

## 2016-10-20 DIAGNOSIS — F4323 Adjustment disorder with mixed anxiety and depressed mood: Secondary | ICD-10-CM | POA: Diagnosis not present

## 2016-11-02 DIAGNOSIS — F4323 Adjustment disorder with mixed anxiety and depressed mood: Secondary | ICD-10-CM | POA: Diagnosis not present

## 2016-11-06 DIAGNOSIS — H02135 Senile ectropion of left lower eyelid: Secondary | ICD-10-CM | POA: Diagnosis not present

## 2016-11-30 DIAGNOSIS — F4323 Adjustment disorder with mixed anxiety and depressed mood: Secondary | ICD-10-CM | POA: Diagnosis not present

## 2016-12-21 DIAGNOSIS — F4323 Adjustment disorder with mixed anxiety and depressed mood: Secondary | ICD-10-CM | POA: Diagnosis not present

## 2017-01-04 DIAGNOSIS — F4323 Adjustment disorder with mixed anxiety and depressed mood: Secondary | ICD-10-CM | POA: Diagnosis not present

## 2017-01-19 DIAGNOSIS — F4323 Adjustment disorder with mixed anxiety and depressed mood: Secondary | ICD-10-CM | POA: Diagnosis not present

## 2017-02-22 DIAGNOSIS — E559 Vitamin D deficiency, unspecified: Secondary | ICD-10-CM | POA: Diagnosis not present

## 2017-02-22 DIAGNOSIS — D510 Vitamin B12 deficiency anemia due to intrinsic factor deficiency: Secondary | ICD-10-CM | POA: Diagnosis not present

## 2017-02-22 DIAGNOSIS — I110 Hypertensive heart disease with heart failure: Secondary | ICD-10-CM | POA: Diagnosis not present

## 2017-02-22 DIAGNOSIS — E7849 Other hyperlipidemia: Secondary | ICD-10-CM | POA: Diagnosis not present

## 2017-02-22 DIAGNOSIS — D513 Other dietary vitamin B12 deficiency anemia: Secondary | ICD-10-CM | POA: Diagnosis not present

## 2017-02-22 DIAGNOSIS — F4323 Adjustment disorder with mixed anxiety and depressed mood: Secondary | ICD-10-CM | POA: Diagnosis not present

## 2017-02-22 DIAGNOSIS — R5383 Other fatigue: Secondary | ICD-10-CM | POA: Diagnosis not present

## 2017-02-27 DIAGNOSIS — J309 Allergic rhinitis, unspecified: Secondary | ICD-10-CM | POA: Diagnosis not present

## 2017-02-27 DIAGNOSIS — M2550 Pain in unspecified joint: Secondary | ICD-10-CM | POA: Diagnosis not present

## 2017-02-27 DIAGNOSIS — R5382 Chronic fatigue, unspecified: Secondary | ICD-10-CM | POA: Diagnosis not present

## 2017-02-27 DIAGNOSIS — M545 Low back pain: Secondary | ICD-10-CM | POA: Diagnosis not present

## 2017-07-10 DIAGNOSIS — M9902 Segmental and somatic dysfunction of thoracic region: Secondary | ICD-10-CM | POA: Diagnosis not present

## 2017-07-10 DIAGNOSIS — M5442 Lumbago with sciatica, left side: Secondary | ICD-10-CM | POA: Diagnosis not present

## 2017-07-10 DIAGNOSIS — M9905 Segmental and somatic dysfunction of pelvic region: Secondary | ICD-10-CM | POA: Diagnosis not present

## 2017-07-10 DIAGNOSIS — M9903 Segmental and somatic dysfunction of lumbar region: Secondary | ICD-10-CM | POA: Diagnosis not present

## 2017-07-13 DIAGNOSIS — M9903 Segmental and somatic dysfunction of lumbar region: Secondary | ICD-10-CM | POA: Diagnosis not present

## 2017-07-13 DIAGNOSIS — M9905 Segmental and somatic dysfunction of pelvic region: Secondary | ICD-10-CM | POA: Diagnosis not present

## 2017-07-13 DIAGNOSIS — E7849 Other hyperlipidemia: Secondary | ICD-10-CM | POA: Diagnosis not present

## 2017-07-13 DIAGNOSIS — R5382 Chronic fatigue, unspecified: Secondary | ICD-10-CM | POA: Diagnosis not present

## 2017-07-13 DIAGNOSIS — M9902 Segmental and somatic dysfunction of thoracic region: Secondary | ICD-10-CM | POA: Diagnosis not present

## 2017-07-13 DIAGNOSIS — E559 Vitamin D deficiency, unspecified: Secondary | ICD-10-CM | POA: Diagnosis not present

## 2017-07-13 DIAGNOSIS — M545 Low back pain: Secondary | ICD-10-CM | POA: Diagnosis not present

## 2017-07-13 DIAGNOSIS — M5442 Lumbago with sciatica, left side: Secondary | ICD-10-CM | POA: Diagnosis not present

## 2017-07-16 DIAGNOSIS — M9905 Segmental and somatic dysfunction of pelvic region: Secondary | ICD-10-CM | POA: Diagnosis not present

## 2017-07-16 DIAGNOSIS — M9903 Segmental and somatic dysfunction of lumbar region: Secondary | ICD-10-CM | POA: Diagnosis not present

## 2017-07-16 DIAGNOSIS — M5442 Lumbago with sciatica, left side: Secondary | ICD-10-CM | POA: Diagnosis not present

## 2017-07-16 DIAGNOSIS — M9902 Segmental and somatic dysfunction of thoracic region: Secondary | ICD-10-CM | POA: Diagnosis not present

## 2017-07-26 DIAGNOSIS — M9903 Segmental and somatic dysfunction of lumbar region: Secondary | ICD-10-CM | POA: Diagnosis not present

## 2017-07-26 DIAGNOSIS — M5442 Lumbago with sciatica, left side: Secondary | ICD-10-CM | POA: Diagnosis not present

## 2017-07-26 DIAGNOSIS — M9902 Segmental and somatic dysfunction of thoracic region: Secondary | ICD-10-CM | POA: Diagnosis not present

## 2017-07-26 DIAGNOSIS — M9905 Segmental and somatic dysfunction of pelvic region: Secondary | ICD-10-CM | POA: Diagnosis not present

## 2017-09-06 DIAGNOSIS — M9905 Segmental and somatic dysfunction of pelvic region: Secondary | ICD-10-CM | POA: Diagnosis not present

## 2017-09-06 DIAGNOSIS — M9902 Segmental and somatic dysfunction of thoracic region: Secondary | ICD-10-CM | POA: Diagnosis not present

## 2017-09-06 DIAGNOSIS — M5442 Lumbago with sciatica, left side: Secondary | ICD-10-CM | POA: Diagnosis not present

## 2017-09-06 DIAGNOSIS — M9903 Segmental and somatic dysfunction of lumbar region: Secondary | ICD-10-CM | POA: Diagnosis not present

## 2017-09-07 ENCOUNTER — Other Ambulatory Visit (HOSPITAL_COMMUNITY): Payer: Self-pay | Admitting: Family Medicine

## 2017-09-07 ENCOUNTER — Ambulatory Visit (HOSPITAL_COMMUNITY)
Admission: RE | Admit: 2017-09-07 | Discharge: 2017-09-07 | Disposition: A | Discharge status: Home / Self Care | Payer: Medicare Other | Source: Ambulatory Visit | Attending: Family Medicine | Admitting: Family Medicine

## 2017-09-07 DIAGNOSIS — M25552 Pain in left hip: Secondary | ICD-10-CM | POA: Diagnosis present

## 2017-09-07 DIAGNOSIS — Z96643 Presence of artificial hip joint, bilateral: Secondary | ICD-10-CM | POA: Diagnosis not present

## 2017-09-07 DIAGNOSIS — G8929 Other chronic pain: Secondary | ICD-10-CM | POA: Insufficient documentation

## 2017-09-07 DIAGNOSIS — R52 Pain, unspecified: Secondary | ICD-10-CM

## 2017-09-07 DIAGNOSIS — Z471 Aftercare following joint replacement surgery: Secondary | ICD-10-CM | POA: Diagnosis not present

## 2017-09-07 DIAGNOSIS — M5136 Other intervertebral disc degeneration, lumbar region: Secondary | ICD-10-CM | POA: Diagnosis not present

## 2017-09-07 DIAGNOSIS — M25551 Pain in right hip: Secondary | ICD-10-CM

## 2017-09-07 DIAGNOSIS — M8588 Other specified disorders of bone density and structure, other site: Secondary | ICD-10-CM | POA: Insufficient documentation

## 2017-09-17 ENCOUNTER — Other Ambulatory Visit (HOSPITAL_COMMUNITY): Payer: Self-pay | Admitting: Family Medicine

## 2017-09-17 DIAGNOSIS — M545 Low back pain: Secondary | ICD-10-CM

## 2017-10-02 ENCOUNTER — Ambulatory Visit (HOSPITAL_COMMUNITY)
Admission: RE | Admit: 2017-10-02 | Discharge: 2017-10-02 | Disposition: A | Discharge status: Home / Self Care | Payer: Medicare Other | Source: Ambulatory Visit | Attending: Family Medicine | Admitting: Family Medicine

## 2017-10-02 DIAGNOSIS — M48061 Spinal stenosis, lumbar region without neurogenic claudication: Secondary | ICD-10-CM | POA: Insufficient documentation

## 2017-10-02 DIAGNOSIS — M545 Low back pain: Secondary | ICD-10-CM | POA: Diagnosis not present

## 2017-10-02 DIAGNOSIS — I700 Atherosclerosis of aorta: Secondary | ICD-10-CM | POA: Diagnosis not present

## 2017-10-02 LAB — POCT I-STAT CREATININE: Creatinine, Ser: 0.9 mg/dL (ref 0.44–1.00)

## 2017-10-02 MED ORDER — IOHEXOL 300 MG/ML  SOLN: 75.0000 mL | Freq: Once | INTRAMUSCULAR | Status: DC | PRN

## 2017-10-02 MED ORDER — IOHEXOL 300 MG/ML  SOLN: 100.0000 mL | Freq: Once | INTRAMUSCULAR | Status: DC | PRN

## 2017-10-02 MED ORDER — IOPAMIDOL (ISOVUE-300) INJECTION 61%
100.0000 mL | Freq: Once | INTRAVENOUS | Status: AC | PRN
  Administered 2017-10-02: 100 mL via INTRAVENOUS

## 2017-10-05 ENCOUNTER — Other Ambulatory Visit: Payer: Self-pay | Admitting: Family Medicine

## 2017-10-05 DIAGNOSIS — Z1231 Encounter for screening mammogram for malignant neoplasm of breast: Secondary | ICD-10-CM

## 2017-10-05 DIAGNOSIS — M2550 Pain in unspecified joint: Secondary | ICD-10-CM | POA: Diagnosis not present

## 2017-10-05 DIAGNOSIS — M79662 Pain in left lower leg: Secondary | ICD-10-CM | POA: Diagnosis not present

## 2017-10-05 DIAGNOSIS — M79661 Pain in right lower leg: Secondary | ICD-10-CM | POA: Diagnosis not present

## 2017-10-05 DIAGNOSIS — M545 Low back pain: Secondary | ICD-10-CM | POA: Diagnosis not present

## 2017-10-08 ENCOUNTER — Other Ambulatory Visit (HOSPITAL_COMMUNITY): Payer: Self-pay | Admitting: Family Medicine

## 2017-10-08 DIAGNOSIS — Z1231 Encounter for screening mammogram for malignant neoplasm of breast: Secondary | ICD-10-CM

## 2017-10-08 DIAGNOSIS — M818 Other osteoporosis without current pathological fracture: Secondary | ICD-10-CM

## 2017-10-17 ENCOUNTER — Other Ambulatory Visit: Payer: 59 | Admitting: Adult Health

## 2017-10-26 ENCOUNTER — Other Ambulatory Visit (HOSPITAL_COMMUNITY): Payer: Medicare Other

## 2017-10-26 ENCOUNTER — Inpatient Hospital Stay: Admission: RE | Admit: 2017-10-26 | Payer: Medicare Other | Source: Ambulatory Visit

## 2017-10-26 ENCOUNTER — Ambulatory Visit (HOSPITAL_COMMUNITY): Payer: Medicare Other

## 2017-10-29 ENCOUNTER — Ambulatory Visit (HOSPITAL_COMMUNITY)
Admission: RE | Admit: 2017-10-29 | Discharge: 2017-10-29 | Disposition: A | Discharge status: Home / Self Care | Payer: Medicare Other | Source: Ambulatory Visit | Attending: Family Medicine | Admitting: Family Medicine

## 2017-10-29 DIAGNOSIS — Z1231 Encounter for screening mammogram for malignant neoplasm of breast: Secondary | ICD-10-CM

## 2017-10-29 DIAGNOSIS — M818 Other osteoporosis without current pathological fracture: Secondary | ICD-10-CM | POA: Diagnosis not present

## 2017-10-29 DIAGNOSIS — Z780 Asymptomatic menopausal state: Secondary | ICD-10-CM | POA: Diagnosis not present

## 2017-11-06 ENCOUNTER — Ambulatory Visit (INDEPENDENT_AMBULATORY_CARE_PROVIDER_SITE_OTHER): Payer: Medicare Other | Admitting: Adult Health

## 2017-11-06 ENCOUNTER — Encounter: Payer: Self-pay | Admitting: Adult Health

## 2017-11-06 VITALS — BP 150/74 | HR 54 | Ht 61.5 in | Wt 158.0 lb

## 2017-11-06 DIAGNOSIS — Z01419 Encounter for gynecological examination (general) (routine) without abnormal findings: Secondary | ICD-10-CM | POA: Diagnosis not present

## 2017-11-06 DIAGNOSIS — Z1212 Encounter for screening for malignant neoplasm of rectum: Secondary | ICD-10-CM

## 2017-11-06 DIAGNOSIS — Z1211 Encounter for screening for malignant neoplasm of colon: Secondary | ICD-10-CM | POA: Insufficient documentation

## 2017-11-06 LAB — HEMOCCULT GUIAC POC 1CARD (OFFICE): Fecal Occult Blood, POC: NEGATIVE

## 2017-11-06 NOTE — Progress Notes (Signed)
Patient ID: Cynthia Cochran, female   DOB: 01/28/1951, 67 y.o.   MRN: [id number] History of Present Illness: Cynthia Cochran is a 67 year old white female, widowed in for well woman gyn exam,she had a normal pap with negative HPV 09/09/15. PCP is Dr Janna ArchonDiego.   Current Medications, Allergies, Past Medical History, Past Surgical History, Family History and Social History were reviewed in [facility] electronic medical record.     Review of Systems: Patient denies any headaches, hearing loss, fatigue, blurred vision, shortness of breath, chest pain, abdominal pain, problems with bowel movements, urination, or intercourse(not active). No joint pain or mood swings.Has sciatic pain, and will smell cigarette smoke at times(well see neurologist,Der Cabell,in near future), feels anxious at times, but stays active and is traveling    Physical Exam:BP (!) 150/74 (BP Location: Right Arm, Patient Position: Sitting, Cuff Size: Normal)   Pulse (!) 54   Ht 5' 1.5" (1.562 m)   Wt 158 lb (71.7 kg)   BMI 29.37 kg/m  General:  Well developed, well nourished, no acute distress Skin:  Warm and dry Neck:  Midline trachea, normal thyroid, good ROM, no lymphadenopathy,no carotid bruits heard Lungs; Clear to auscultation bilaterally Breast:  No dominant palpable mass, retraction, or nipple discharge Cardiovascular: Regular rate and rhythm Abdomen:  Soft, non tender, no hepatosplenomegaly Pelvic:  External genitalia is normal in appearance, no lesions.  The vagina is normal in appearance for age with loss of color,moisture and rugae. Urethra has no lesions or masses. The cervix is smooth.  Uterus is felt to be normal size, shape, and contour.  No adnexal masses or tenderness noted.Bladder is non tender, no masses felt. Rectal: Good sphincter tone, no polyps, or hemorrhoids felt.  Hemoccult negative. Extremities/musculoskeletal:  No swelling or varicosities noted, no clubbing or cyanosis Psych:  No mood changes, alert and  cooperative,seems happy PHQ 9 score 4. Denies being suicidal, declines meds, Reviewed DEXA with her and was normal.CT showed DJD. Stay active, take MV with calcium and vitamin D, I would not start fosamax at this time.  Impression: 1. Encounter for well woman exam with routine gynecological exam   2. Screening for colorectal cancer       Plan: Pap and physical in 2 years, can stop paps at 70 if desired Labs with PCP Mammogram yearly

## 2017-11-08 DIAGNOSIS — Z6828 Body mass index (BMI) 28.0-28.9, adult: Secondary | ICD-10-CM | POA: Diagnosis not present

## 2017-11-08 DIAGNOSIS — R030 Elevated blood-pressure reading, without diagnosis of hypertension: Secondary | ICD-10-CM | POA: Diagnosis not present

## 2017-11-08 DIAGNOSIS — M4726 Other spondylosis with radiculopathy, lumbar region: Secondary | ICD-10-CM | POA: Diagnosis not present

## 2017-11-08 DIAGNOSIS — I10 Essential (primary) hypertension: Secondary | ICD-10-CM | POA: Diagnosis not present

## 2017-11-08 DIAGNOSIS — Z6829 Body mass index (BMI) 29.0-29.9, adult: Secondary | ICD-10-CM | POA: Diagnosis not present

## 2017-11-08 DIAGNOSIS — R438 Other disturbances of smell and taste: Secondary | ICD-10-CM | POA: Diagnosis not present

## 2017-11-13 ENCOUNTER — Other Ambulatory Visit: Payer: Self-pay | Admitting: Neurosurgery

## 2017-11-13 DIAGNOSIS — M4720 Other spondylosis with radiculopathy, site unspecified: Secondary | ICD-10-CM

## 2017-11-22 DIAGNOSIS — I10 Essential (primary) hypertension: Secondary | ICD-10-CM | POA: Diagnosis not present

## 2017-11-22 DIAGNOSIS — Z6828 Body mass index (BMI) 28.0-28.9, adult: Secondary | ICD-10-CM | POA: Diagnosis not present

## 2017-11-22 DIAGNOSIS — R438 Other disturbances of smell and taste: Secondary | ICD-10-CM | POA: Diagnosis not present

## 2017-11-23 ENCOUNTER — Ambulatory Visit
Admission: RE | Admit: 2017-11-23 | Discharge: 2017-11-23 | Disposition: A | Discharge status: Home / Self Care | Payer: Medicare Other | Source: Ambulatory Visit | Attending: Neurosurgery | Admitting: Neurosurgery

## 2017-11-23 DIAGNOSIS — M48061 Spinal stenosis, lumbar region without neurogenic claudication: Secondary | ICD-10-CM | POA: Diagnosis not present

## 2017-11-23 DIAGNOSIS — M4720 Other spondylosis with radiculopathy, site unspecified: Secondary | ICD-10-CM

## 2017-11-26 DIAGNOSIS — M4726 Other spondylosis with radiculopathy, lumbar region: Secondary | ICD-10-CM | POA: Diagnosis not present

## 2017-11-26 DIAGNOSIS — Z6829 Body mass index (BMI) 29.0-29.9, adult: Secondary | ICD-10-CM | POA: Diagnosis not present

## 2017-11-26 DIAGNOSIS — R030 Elevated blood-pressure reading, without diagnosis of hypertension: Secondary | ICD-10-CM | POA: Diagnosis not present

## 2017-11-27 DIAGNOSIS — E782 Mixed hyperlipidemia: Secondary | ICD-10-CM | POA: Diagnosis not present

## 2017-11-27 DIAGNOSIS — F411 Generalized anxiety disorder: Secondary | ICD-10-CM | POA: Diagnosis not present

## 2017-11-27 DIAGNOSIS — M545 Low back pain: Secondary | ICD-10-CM | POA: Diagnosis not present

## 2017-11-27 DIAGNOSIS — R439 Unspecified disturbances of smell and taste: Secondary | ICD-10-CM | POA: Diagnosis not present

## 2017-11-27 DIAGNOSIS — Z0001 Encounter for general adult medical examination with abnormal findings: Secondary | ICD-10-CM | POA: Diagnosis not present

## 2017-11-27 DIAGNOSIS — Z23 Encounter for immunization: Secondary | ICD-10-CM | POA: Diagnosis not present

## 2017-11-27 DIAGNOSIS — M25552 Pain in left hip: Secondary | ICD-10-CM | POA: Diagnosis not present

## 2017-12-13 ENCOUNTER — Ambulatory Visit (INDEPENDENT_AMBULATORY_CARE_PROVIDER_SITE_OTHER): Payer: Medicare Other | Admitting: Otolaryngology

## 2017-12-13 ENCOUNTER — Other Ambulatory Visit (INDEPENDENT_AMBULATORY_CARE_PROVIDER_SITE_OTHER): Payer: Self-pay | Admitting: Otolaryngology

## 2017-12-13 DIAGNOSIS — R442 Other hallucinations: Secondary | ICD-10-CM

## 2017-12-13 DIAGNOSIS — R438 Other disturbances of smell and taste: Secondary | ICD-10-CM

## 2017-12-17 ENCOUNTER — Other Ambulatory Visit (INDEPENDENT_AMBULATORY_CARE_PROVIDER_SITE_OTHER): Payer: Self-pay | Admitting: Otolaryngology

## 2017-12-17 DIAGNOSIS — R442 Other hallucinations: Secondary | ICD-10-CM

## 2017-12-18 ENCOUNTER — Ambulatory Visit
Admission: RE | Admit: 2017-12-18 | Discharge: 2017-12-18 | Disposition: A | Discharge status: Home / Self Care | Payer: Medicare Other | Source: Ambulatory Visit | Attending: Otolaryngology | Admitting: Otolaryngology

## 2017-12-18 DIAGNOSIS — R430 Anosmia: Secondary | ICD-10-CM | POA: Diagnosis not present

## 2017-12-18 DIAGNOSIS — R442 Other hallucinations: Secondary | ICD-10-CM

## 2017-12-18 MED ORDER — GADOBENATE DIMEGLUMINE 529 MG/ML IV SOLN
15.0000 mL | Freq: Once | INTRAVENOUS | Status: AC | PRN
  Administered 2017-12-18: 15 mL via INTRAVENOUS

## 2018-01-07 DIAGNOSIS — M4726 Other spondylosis with radiculopathy, lumbar region: Secondary | ICD-10-CM | POA: Diagnosis not present

## 2018-01-07 DIAGNOSIS — M5416 Radiculopathy, lumbar region: Secondary | ICD-10-CM | POA: Diagnosis not present

## 2018-01-21 ENCOUNTER — Ambulatory Visit (INDEPENDENT_AMBULATORY_CARE_PROVIDER_SITE_OTHER): Payer: Medicare Other | Admitting: Otolaryngology

## 2018-01-21 DIAGNOSIS — R438 Other disturbances of smell and taste: Secondary | ICD-10-CM | POA: Diagnosis not present

## 2018-01-21 DIAGNOSIS — J342 Deviated nasal septum: Secondary | ICD-10-CM

## 2018-02-01 DIAGNOSIS — M1380 Other specified arthritis, unspecified site: Secondary | ICD-10-CM | POA: Diagnosis not present

## 2018-02-01 DIAGNOSIS — R202 Paresthesia of skin: Secondary | ICD-10-CM | POA: Diagnosis not present

## 2018-02-04 ENCOUNTER — Encounter (INDEPENDENT_AMBULATORY_CARE_PROVIDER_SITE_OTHER): Payer: Self-pay | Admitting: *Deleted

## 2018-02-06 DIAGNOSIS — M5416 Radiculopathy, lumbar region: Secondary | ICD-10-CM | POA: Diagnosis not present

## 2018-02-06 DIAGNOSIS — M4726 Other spondylosis with radiculopathy, lumbar region: Secondary | ICD-10-CM | POA: Diagnosis not present

## 2018-02-22 DIAGNOSIS — M1380 Other specified arthritis, unspecified site: Secondary | ICD-10-CM | POA: Diagnosis not present

## 2018-02-22 DIAGNOSIS — R202 Paresthesia of skin: Secondary | ICD-10-CM | POA: Diagnosis not present

## 2018-02-22 DIAGNOSIS — M5430 Sciatica, unspecified side: Secondary | ICD-10-CM | POA: Diagnosis not present

## 2018-02-22 DIAGNOSIS — R439 Unspecified disturbances of smell and taste: Secondary | ICD-10-CM | POA: Diagnosis not present

## 2018-02-25 DIAGNOSIS — Z6829 Body mass index (BMI) 29.0-29.9, adult: Secondary | ICD-10-CM | POA: Diagnosis not present

## 2018-02-25 DIAGNOSIS — M4726 Other spondylosis with radiculopathy, lumbar region: Secondary | ICD-10-CM | POA: Diagnosis not present

## 2018-02-25 DIAGNOSIS — R030 Elevated blood-pressure reading, without diagnosis of hypertension: Secondary | ICD-10-CM | POA: Diagnosis not present

## 2018-02-27 ENCOUNTER — Other Ambulatory Visit (INDEPENDENT_AMBULATORY_CARE_PROVIDER_SITE_OTHER): Payer: Self-pay | Admitting: *Deleted

## 2018-02-27 DIAGNOSIS — Z800 Family history of malignant neoplasm of digestive organs: Secondary | ICD-10-CM | POA: Insufficient documentation

## 2018-04-07 ENCOUNTER — Other Ambulatory Visit: Payer: Self-pay

## 2018-04-07 ENCOUNTER — Encounter (HOSPITAL_COMMUNITY): Payer: Self-pay

## 2018-04-07 ENCOUNTER — Inpatient Hospital Stay (HOSPITAL_COMMUNITY)
Admission: EM | Admit: 2018-04-07 | Discharge: 2018-04-10 | DRG: 392 | Disposition: A | Discharge status: Home / Self Care | Payer: Medicare Other | Attending: Internal Medicine | Admitting: Internal Medicine

## 2018-04-07 ENCOUNTER — Emergency Department (HOSPITAL_COMMUNITY): Payer: Medicare Other

## 2018-04-07 DIAGNOSIS — E7800 Pure hypercholesterolemia, unspecified: Secondary | ICD-10-CM | POA: Diagnosis present

## 2018-04-07 DIAGNOSIS — F419 Anxiety disorder, unspecified: Secondary | ICD-10-CM | POA: Diagnosis not present

## 2018-04-07 DIAGNOSIS — R1310 Dysphagia, unspecified: Secondary | ICD-10-CM | POA: Diagnosis present

## 2018-04-07 DIAGNOSIS — E785 Hyperlipidemia, unspecified: Secondary | ICD-10-CM | POA: Diagnosis not present

## 2018-04-07 DIAGNOSIS — R109 Unspecified abdominal pain: Secondary | ICD-10-CM | POA: Diagnosis present

## 2018-04-07 DIAGNOSIS — R030 Elevated blood-pressure reading, without diagnosis of hypertension: Secondary | ICD-10-CM | POA: Diagnosis present

## 2018-04-07 DIAGNOSIS — E876 Hypokalemia: Secondary | ICD-10-CM | POA: Diagnosis present

## 2018-04-07 DIAGNOSIS — R1084 Generalized abdominal pain: Principal | ICD-10-CM | POA: Diagnosis present

## 2018-04-07 DIAGNOSIS — R51 Headache: Secondary | ICD-10-CM | POA: Diagnosis present

## 2018-04-07 DIAGNOSIS — Z8711 Personal history of peptic ulcer disease: Secondary | ICD-10-CM

## 2018-04-07 DIAGNOSIS — M1990 Unspecified osteoarthritis, unspecified site: Secondary | ICD-10-CM | POA: Diagnosis not present

## 2018-04-07 DIAGNOSIS — Z96643 Presence of artificial hip joint, bilateral: Secondary | ICD-10-CM | POA: Diagnosis present

## 2018-04-07 DIAGNOSIS — R112 Nausea with vomiting, unspecified: Secondary | ICD-10-CM | POA: Diagnosis not present

## 2018-04-07 DIAGNOSIS — K7689 Other specified diseases of liver: Secondary | ICD-10-CM | POA: Diagnosis not present

## 2018-04-07 DIAGNOSIS — G8929 Other chronic pain: Secondary | ICD-10-CM | POA: Diagnosis present

## 2018-04-07 DIAGNOSIS — R1033 Periumbilical pain: Secondary | ICD-10-CM | POA: Diagnosis present

## 2018-04-07 DIAGNOSIS — D649 Anemia, unspecified: Secondary | ICD-10-CM | POA: Diagnosis present

## 2018-04-07 DIAGNOSIS — R1032 Left lower quadrant pain: Secondary | ICD-10-CM | POA: Diagnosis not present

## 2018-04-07 DIAGNOSIS — M5430 Sciatica, unspecified side: Secondary | ICD-10-CM | POA: Diagnosis present

## 2018-04-07 DIAGNOSIS — Z7982 Long term (current) use of aspirin: Secondary | ICD-10-CM

## 2018-04-07 DIAGNOSIS — Z79899 Other long term (current) drug therapy: Secondary | ICD-10-CM

## 2018-04-07 DIAGNOSIS — Z8249 Family history of ischemic heart disease and other diseases of the circulatory system: Secondary | ICD-10-CM

## 2018-04-07 LAB — CBC WITH DIFFERENTIAL/PLATELET
Abs Immature Granulocytes: 0.04 10*3/uL (ref 0.00–0.07)
Basophils Absolute: 0 10*3/uL (ref 0.0–0.1)
Basophils Relative: 0 %
Eosinophils Absolute: 0 10*3/uL (ref 0.0–0.5)
Eosinophils Relative: 0 %
HCT: 37.9 % (ref 36.0–46.0)
Hemoglobin: 12.5 g/dL (ref 12.0–15.0)
Immature Granulocytes: 0 %
Lymphocytes Relative: 7 %
Lymphs Abs: 0.8 10*3/uL (ref 0.7–4.0)
MCH: 29.9 pg (ref 26.0–34.0)
MCHC: 33 g/dL (ref 30.0–36.0)
MCV: 90.7 fL (ref 80.0–100.0)
Monocytes Absolute: 0.9 10*3/uL (ref 0.1–1.0)
Monocytes Relative: 8 %
Neutro Abs: 9.8 10*3/uL — ABNORMAL HIGH (ref 1.7–7.7)
Neutrophils Relative %: 85 %
Platelets: 242 10*3/uL (ref 150–400)
RBC: 4.18 MIL/uL (ref 3.87–5.11)
RDW: 12.9 % (ref 11.5–15.5)
WBC: 11.6 10*3/uL — ABNORMAL HIGH (ref 4.0–10.5)
nRBC: 0 % (ref 0.0–0.2)

## 2018-04-07 LAB — COMPREHENSIVE METABOLIC PANEL
ALT: 15 U/L (ref 0–44)
AST: 20 U/L (ref 15–41)
Albumin: 4.3 g/dL (ref 3.5–5.0)
Alkaline Phosphatase: 31 U/L — ABNORMAL LOW (ref 38–126)
Anion gap: 10 (ref 5–15)
BUN: 14 mg/dL (ref 8–23)
CO2: 23 mmol/L (ref 22–32)
Calcium: 9.5 mg/dL (ref 8.9–10.3)
Chloride: 106 mmol/L (ref 98–111)
Creatinine, Ser: 0.74 mg/dL (ref 0.44–1.00)
GFR calc Af Amer: >60 mL/min (ref >60)
GFR calc non Af Amer: >60 mL/min (ref >60)
Glucose, Bld: 124 mg/dL — ABNORMAL HIGH (ref 70–99)
Potassium: 3.3 mmol/L — ABNORMAL LOW (ref 3.5–5.1)
Sodium: 139 mmol/L (ref 135–145)
Total Bilirubin: 1.2 mg/dL (ref 0.3–1.2)
Total Protein: 7.7 g/dL (ref 6.5–8.1)

## 2018-04-07 LAB — LIPASE, BLOOD: Lipase: 26 U/L (ref 11–51)

## 2018-04-07 LAB — URINALYSIS, ROUTINE W REFLEX MICROSCOPIC
Bilirubin Urine: NEGATIVE
Glucose, UA: NEGATIVE mg/dL
Hgb urine dipstick: NEGATIVE
Ketones, ur: NEGATIVE mg/dL
Leukocytes, UA: NEGATIVE
Nitrite: NEGATIVE
Protein, ur: NEGATIVE mg/dL
Specific Gravity, Urine: >1.046 — ABNORMAL HIGH (ref 1.005–1.030)
pH: 7 (ref 5.0–8.0)

## 2018-04-07 LAB — TROPONIN I: Troponin I: <0.03 ng/mL (ref <0.03)

## 2018-04-07 MED ORDER — FENTANYL CITRATE (PF) 100 MCG/2ML IJ SOLN
50.0000 ug | Freq: Once | INTRAMUSCULAR | Status: AC
  Administered 2018-04-07: 50 ug via INTRAVENOUS
  Filled 2018-04-07: qty 2

## 2018-04-07 MED ORDER — SODIUM CHLORIDE 0.9 % IV BOLUS
500.0000 mL | Freq: Once | INTRAVENOUS | Status: AC
  Administered 2018-04-07: 500 mL via INTRAVENOUS

## 2018-04-07 MED ORDER — ONDANSETRON HCL 4 MG/2ML IJ SOLN
4.0000 mg | Freq: Once | INTRAMUSCULAR | Status: AC
  Administered 2018-04-07: 4 mg via INTRAVENOUS
  Filled 2018-04-07: qty 2

## 2018-04-07 MED ORDER — PANTOPRAZOLE SODIUM 40 MG IV SOLR
40.0000 mg | Freq: Once | INTRAVENOUS | Status: DC
  Filled 2018-04-07: qty 40

## 2018-04-07 MED ORDER — FENTANYL CITRATE (PF) 100 MCG/2ML IJ SOLN
50.0000 ug | INTRAMUSCULAR | Status: DC | PRN
  Administered 2018-04-07 – 2018-04-08 (×4): 50 ug via INTRAVENOUS
  Filled 2018-04-07 (×4): qty 2

## 2018-04-07 MED ORDER — NITROGLYCERIN 0.4 MG SL SUBL: 0.4000 mg | SUBLINGUAL_TABLET | SUBLINGUAL | Status: DC | PRN

## 2018-04-07 MED ORDER — ACETAMINOPHEN 650 MG RE SUPP: 650.0000 mg | Freq: Four times a day (QID) | RECTAL | Status: DC | PRN

## 2018-04-07 MED ORDER — MORPHINE SULFATE (PF) 2 MG/ML IV SOLN
2.0000 mg | Freq: Once | INTRAVENOUS | Status: DC
  Filled 2018-04-07: qty 1

## 2018-04-07 MED ORDER — ONDANSETRON HCL 4 MG/2ML IJ SOLN
4.0000 mg | Freq: Four times a day (QID) | INTRAMUSCULAR | Status: DC | PRN
  Administered 2018-04-07 (×2): 4 mg via INTRAVENOUS
  Filled 2018-04-07 (×2): qty 2

## 2018-04-07 MED ORDER — ACETAMINOPHEN 325 MG PO TABS
650.0000 mg | ORAL_TABLET | Freq: Four times a day (QID) | ORAL | Status: DC | PRN
  Administered 2018-04-08 – 2018-04-10 (×5): 650 mg via ORAL
  Filled 2018-04-07 (×4): qty 2

## 2018-04-07 MED ORDER — PANTOPRAZOLE SODIUM 40 MG IV SOLR
40.0000 mg | Freq: Two times a day (BID) | INTRAVENOUS | Status: DC
  Administered 2018-04-07 – 2018-04-10 (×7): 40 mg via INTRAVENOUS
  Filled 2018-04-07 (×6): qty 40

## 2018-04-07 MED ORDER — IOPAMIDOL (ISOVUE-300) INJECTION 61%
100.0000 mL | Freq: Once | INTRAVENOUS | Status: AC | PRN
  Administered 2018-04-07: 100 mL via INTRAVENOUS

## 2018-04-07 MED ORDER — LORAZEPAM 2 MG/ML IJ SOLN
1.0000 mg | Freq: Every evening | INTRAMUSCULAR | Status: DC | PRN
  Administered 2018-04-07 – 2018-04-09 (×3): 1 mg via INTRAVENOUS
  Filled 2018-04-07 (×3): qty 1

## 2018-04-07 MED ORDER — POTASSIUM CHLORIDE 10 MEQ/100ML IV SOLN
10.0000 meq | Freq: Once | INTRAVENOUS | Status: AC
  Administered 2018-04-07: 10 meq via INTRAVENOUS
  Filled 2018-04-07: qty 100

## 2018-04-07 MED ORDER — SODIUM CHLORIDE 0.9 % IV BOLUS
1000.0000 mL | Freq: Once | INTRAVENOUS | Status: AC
  Administered 2018-04-07: 1000 mL via INTRAVENOUS

## 2018-04-07 MED ORDER — SODIUM CHLORIDE 0.9 % IV SOLN
INTRAVENOUS | Status: DC
  Administered 2018-04-07: 11:00:00 via INTRAVENOUS

## 2018-04-07 MED ORDER — SODIUM CHLORIDE 0.9 % IV SOLN
INTRAVENOUS | Status: DC
  Administered 2018-04-07 – 2018-04-08 (×2): via INTRAVENOUS
  Filled 2018-04-07 (×5): qty 1000

## 2018-04-07 NOTE — ED Triage Notes (Signed)
Pt reports mid-abdominal pain and vomiting that started yesterday. Pt reports vomiting about three times and unable to keep down food/liquids. Denies diarrhea. No fever. Last BM was yesterday.

## 2018-04-07 NOTE — H&P (Signed)
History and Physical    Cynthia Cochran:[id number] DOB: 1950/03/31 DOA: 04/07/2018  PCP: Benita Stabile, MD  Patient coming from: Home  I have personally briefly reviewed patient's old medical records in [facility]  Chief Complaint: Abdominal pain  HPI: Cynthia Cochran is a 68 y.o. female with medical history significant of chronic back pain and arthritis who was previously taking NSAIDs, presents to the hospital with complaints of nausea, periumbilical abdominal pain and 2-3 episodes of vomiting preceding the pain.  She denies any fever.  She had normal bowel movement last night.  She is not had any dysuria.  She is unable to eat or drink anything.  She is feeling generally weak.  She does not have any shortness of breath.  She does describe some left-sided chest pain, feels this usually occurs when she gets stressed out.  Upon arrival to the emergency room, she received a dose of IV fentanyl which did improve some of her symptoms.  She underwent CT scanning of the abdomen pelvis that showed pneumatosis in the gastric wall.  Gastroenterology was consulted who recommended admission to the hospital for observation.  Review of Systems: As per HPI otherwise 10 point review of systems negative.    Past Medical History:  Diagnosis Date  . Anxiety   . Arthritis   . Blood transfusion without reported diagnosis 1982   after c section  . Dysphagia 04/25/2016  . High cholesterol 04/25/2016  . Hyperlipidemia   . Mixed stress and urge urinary incontinence 09/09/2015  . Sciatic nerve pain     Past Surgical History:  Procedure Laterality Date  . BIOPSY  05/12/2016   Procedure: BIOPSY;  Surgeon: Malissa Hippo, MD;  [facility];  Service: Endoscopy;;  gastric  . CARPAL TUNNEL RELEASE     right; 1998, left 1998  . CESAREAN SECTION  1982  . Colonscopy    . ESOPHAGEAL DILATION N/A 05/12/2016   Procedure: ESOPHAGEAL DILATION;  Surgeon: Malissa Hippo, MD;  [facility];   Service: Endoscopy;  Laterality: N/A;  . ESOPHAGOGASTRODUODENOSCOPY N/A 05/12/2016   Procedure: ESOPHAGOGASTRODUODENOSCOPY (EGD);  Surgeon: Malissa Hippo, MD;  [facility];  Service: Endoscopy;  Laterality: N/A;  9:30  . TOTAL HIP ARTHROPLASTY     right; March 2013; left March 2011    Social History:  reports that she has never smoked. She has never used smokeless tobacco. She reports current alcohol use. She reports that she does not use drugs.  No Known Allergies  Family History  Problem Relation Age of Onset  . Colon cancer Father 26  . Colon cancer Brother 50  . Other Mother        heavy smoker; died from arterial sclerosis  . Hypertension Sister   . Stomach cancer Neg Hx     Prior to Admission medications   Medication Sig Start Date End Date Taking? Authorizing Provider  acetaminophen (TYLENOL) 325 MG tablet Take 650 mg by mouth every 6 (six) hours as needed.   Yes [provider]  aspirin 81 MG tablet Take 81 mg by mouth at bedtime.    Yes [provider]  atorvastatin (LIPITOR) 80 MG tablet Take 80 mg by mouth at bedtime.  12/14/11  Yes [provider]  Biotin 1 MG CAPS Take 1 tablet by mouth at bedtime.    Yes [provider]  Calcium Carb-Cholecalciferol (CALCIUM 500+D3 PO) Take by mouth daily.   Yes [provider]  celecoxib (CELEBREX) 200 MG capsule Take 200 mg by mouth every morning.   Yes [provider]  Cholecalciferol 5000 units TABS Take 5,000 Units by mouth at bedtime. Vit D3   Yes [provider]  Docusate Sodium (COLACE PO) Take by mouth daily.   Yes [provider]  ezetimibe (ZETIA) 10 MG tablet Take 10 mg by mouth at bedtime.    Yes [provider]  LORazepam (ATIVAN) 2 MG tablet Take 2 mg by mouth at bedtime. 08/18/15  Yes [provider]  fenofibrate 160 MG tablet Take 160 mg by mouth daily.    [provider]    Physical Exam: Vitals:   04/07/18  0623 04/07/18 0624 04/07/18 1146 04/07/18 1200  BP: (!) 159/64  (!) 185/75 (!) 190/87  Pulse: (!) 57  (!) 55 65  Resp: 18  19   Temp: (!) 97.5 F (36.4 C)     TempSrc: Oral     SpO2: 99%  99% 99%  Weight:  68 kg    Height:  5\' 2"  (1.575 m)      Constitutional: NAD, calm, comfortable Eyes: PERRL, lids and conjunctivae normal ENMT: Mucous membranes are moist. Posterior pharynx clear of any exudate or lesions.Normal dentition.  Neck: normal, supple, no masses, no thyromegaly Respiratory: clear to auscultation bilaterally, no wheezing, no crackles. Normal respiratory effort. No accessory muscle use.  Cardiovascular: Regular rate and rhythm, no murmurs / rubs / gallops. No extremity edema. 2+ pedal pulses. No carotid bruits.  Abdomen: Tenderness in left lower quadrant around the waistline, no guarding, no rebound no masses palpated. No hepatosplenomegaly. Bowel sounds positive.  Musculoskeletal: no clubbing / cyanosis. No joint deformity upper and lower extremities. Good ROM, no contractures. Normal muscle tone.  Skin: no rashes, lesions, ulcers. No induration Neurologic: CN 2-12 grossly intact. Sensation intact, DTR normal. Strength 5/5 in all 4.  Psychiatric: Normal judgment and insight. Alert and oriented x 3. Normal mood.    Labs on Admission: I have personally reviewed following labs and imaging studies  CBC: Recent Labs  Lab 04/07/18 0645  WBC 11.6*  NEUTROABS 9.8*  HGB 12.5  HCT 37.9  MCV 90.7  PLT 242   Basic Metabolic Panel: Recent Labs  Lab 04/07/18 0645  NA 139  K 3.3*  CL 106  CO2 23  GLUCOSE 124*  BUN 14  CREATININE 0.74  CALCIUM 9.5   GFR: Estimated Creatinine Clearance: 61.7 mL/min (by C-G formula based on SCr of 0.74 mg/dL). Liver Function Tests: Recent Labs  Lab 04/07/18 0645  AST 20  ALT 15  ALKPHOS 31*  BILITOT 1.2  PROT 7.7  ALBUMIN 4.3   Recent Labs  Lab 04/07/18 0645  LIPASE 26   No results for input(s): AMMONIA in the last 168  hours. Coagulation Profile: No results for input(s): INR, PROTIME in the last 168 hours. Cardiac Enzymes: No results for input(s): CKTOTAL, CKMB, CKMBINDEX, TROPONINI in the last 168 hours. BNP (last 3 results) No results for input(s): PROBNP in the last 8760 hours. HbA1C: No results for input(s): HGBA1C in the last 72 hours. CBG: No results for input(s): GLUCAP in the last 168 hours. Lipid Profile: No results for input(s): CHOL, HDL, LDLCALC, TRIG, CHOLHDL, LDLDIRECT in the last 72 hours. Thyroid Function Tests: No results for input(s): TSH, T4TOTAL, FREET4, T3FREE, THYROIDAB in the last 72 hours. Anemia Panel: No results for input(s): VITAMINB12, FOLATE, FERRITIN, TIBC, IRON, RETICCTPCT in the last 72 hours. Urine analysis:  Component Value Date/Time   COLORURINE STRAW (A) 04/07/2018 0638   APPEARANCEUR CLEAR 04/07/2018 0638   LABSPEC >1.046 (H) 04/07/2018 0638   PHURINE 7.0 04/07/2018 0638   GLUCOSEU NEGATIVE 04/07/2018 0638   HGBUR NEGATIVE 04/07/2018 0638   BILIRUBINUR NEGATIVE 04/07/2018 0638   KETONESUR NEGATIVE 04/07/2018 0638   PROTEINUR NEGATIVE 04/07/2018 0638   NITRITE NEGATIVE 04/07/2018 0638   LEUKOCYTESUR NEGATIVE 04/07/2018 9741    Radiological Exams on Admission: Ct Abdomen Pelvis W Contrast  Result Date: 04/07/2018 CLINICAL DATA:  Periumbilical pain with vomiting EXAM: CT ABDOMEN AND PELVIS WITH CONTRAST TECHNIQUE: Multidetector CT imaging of the abdomen and pelvis was performed using the standard protocol following bolus administration of intravenous contrast. CONTRAST:  ISOVUE-300 IOPAMIDOL (ISOVUE-300) INJECTION 61% COMPARISON:  None. FINDINGS: Lower chest:  No acute finding.  Lad atherosclerotic calcification. Hepatobiliary: Small hepatic cystic densities.No evidence of biliary obstruction or stone. Pancreas: Unremarkable. Spleen: Unremarkable. Adrenals/Urinary Tract: Negative adrenals. No hydronephrosis or stone. Unremarkable bladder. Stomach/Bowel:  Marked submucosal low-density thickening of the stomach wall with patchy submucosal pneumatosis. Suspect this is peptic ulcer disease, there was scarring probably from prior peptic ulcer disease on a EGD in 2018. Updated EGD or follow-up imaging will be needed to exclude neoplastic process. There is mild fat haziness around the affected area with enlarged Peri antral gastric lymph nodes. Vascular/Lymphatic: Atherosclerotic calcification. No acute vascular finding . Reproductive:Benign right adnexal calcification. 15 mm right adnexal simple appearing cystic density, stable from lumbar MRI 09/02/2013 Other: No ascites or pneumoperitoneum. Musculoskeletal: Diffuse advanced degenerative disease of the lumbar in thoracic spine. There is bulky facet spurring at T9-10 and T10-11, with presumed cord impingement. Bilateral hip arthroplasty. IMPRESSION: 1. Marked submucosal low-density thickening of the distal stomach with pneumatosis, likely inflammatory and from peptic ulcer disease. No extraluminal perforation. Recommend updated EGD or CT after convalescence. 2. Notably advanced spinal degeneration. Facet spurs at T9-10 and T10-11 likely compress the cord. Electronically Signed   By: Marnee Spring M.D.   On: 04/07/2018 09:32    Assessment/Plan Principal Problem:   Abdominal pain Active Problems:   High cholesterol   Hypokalemia   Osteoarthritis   Elevated blood pressure reading     1. Abdominal pain/abnormal CT findings.  Patient noted to have gastric wall thickening and pneumatosis on CT abdomen.  Concern for peptic ulcer disease with impending perforation.  Recommendations are for PPI twice daily, bowel rest with n.p.o. status, serial exams.  If she deteriorates, will need surgical evaluation.  Gastroenterology will follow.  Hold any further NSAIDs. 2. Upper lipidemia.  Chronically on statin/Zetia.  Resume when able. 3. Hypokalemia.  Replace 4. Elevated blood pressure.  Suspect this is related to  pain/situational.  Continue to monitor.  DVT prophylaxis: SCDs Code Status: Full code Family Communication: Discussed with family at the bedside Disposition Plan: Discharge home once improved Consults called: Gastroenterology Admission status: Observation, MedSurg  Erick Blinks MD [facility]   If 7PM-7AM, please contact night-coverage [email or web address]   04/07/2018, 4:41 PM

## 2018-04-07 NOTE — Consult Note (Signed)
Referring Provider: No ref. provider found Primary Care Physician:  Benita StabileHall, John Z, MD Primary Gastroenterologist:  Dr.Rehman  Reason for Consultation: Abnormal CT; abdominal pain  HPI: Cynthia 68 year old Cochran presents the emergency department today with acute onset nausea, vomiting and periumbilical abdominal pain yesterday.  Evaluation in ED including a CT scan diffuse demonstrated significant thickening of the antrum and pneumatosis (gasric wall).  No pneumoperitoneum seen. Patient readily admits to taking nearly a 4-week course of meloxicam recently for neck and back pain.  She developed similar, mild symptoms about 3 or 4 weeks ago.  Meloxicam was changed to Celebrex while continuing a baby aspirin daily.  Protonix 40 mg once daily was added to her regimen along with Celebrex.  States she did okay until yesterday when her acute symptoms developed. She has been receiving periodic IV fentanyl in the ED which blunts but does not totally relieve her pain which she states at times is severe 7-8/ 10. She has not had any melena or hematochezia.  No hematemesis.  She underwent an EGD for dysphagia 2 years ago (Dr. Karilyn Cotaehman); empiric dilation performed.  Gastric erosions and scar suggesting old peptic ulcer disease also seen at that time.  Biopsies demonstrated nonspecific inflammation.  No evidence of H. Pylori.  Persisting dysphagia led to a barium pill esophagram which revealed only presbyesophagus. Other pertinent GI history significant for family history of colon cancer in both her father and brother.  Her last colonoscopy was reported to be negative at [facility] (Dr. Juanda ChanceBrodie) about 5 years ago.  She is scheduled to see Dr. Karilyn Cotaehman at some point in the near future for an updated examination. Aside from above-mentioned stigmata of peptic ulcer disease suggested on prior EGD, she denies ever having a diagnosis of active peptic ulcer disease. Other recent past medical history significant for chronic  Phantosomia (odor of cigarette smoke) for which he is followed by ENT.   Past Medical History:  Diagnosis Date  . Anxiety   . Arthritis   . Blood transfusion without reported diagnosis 1982   after c section  . Dysphagia 04/25/2016  . High cholesterol 04/25/2016  . Hyperlipidemia   . Mixed stress and urge urinary incontinence 09/09/2015  . Sciatic nerve pain     Past Surgical History:  Procedure Laterality Date  . BIOPSY  05/12/2016   Procedure: BIOPSY;  Surgeon: Malissa HippoNajeeb U Rehman, MD;  [facility];  Service: Endoscopy;;  gastric  . CARPAL TUNNEL RELEASE     right; 1998, left 1998  . CESAREAN SECTION  1982  . Colonscopy    . ESOPHAGEAL DILATION N/A 05/12/2016   Procedure: ESOPHAGEAL DILATION;  Surgeon: Malissa HippoNajeeb U Rehman, MD;  [facility];  Service: Endoscopy;  Laterality: N/A;  . ESOPHAGOGASTRODUODENOSCOPY N/A 05/12/2016   Procedure: ESOPHAGOGASTRODUODENOSCOPY (EGD);  Surgeon: Malissa HippoNajeeb U Rehman, MD;  [facility];  Service: Endoscopy;  Laterality: N/A;  9:30  . TOTAL HIP ARTHROPLASTY     right; March 2013; left March 2011    Prior to Admission medications   Medication Sig Start Date End Date Taking? Authorizing Provider  acetaminophen (TYLENOL) 325 MG tablet Take 650 mg by mouth every 6 (six) hours as needed.   Yes [provider]  aspirin 81 MG tablet Take 81 mg by mouth at bedtime.    Yes [provider]  atorvastatin (LIPITOR) 80 MG tablet Take 80 mg by mouth at bedtime.  12/14/11  Yes [provider]  Biotin 1 MG  CAPS Take 1 tablet by mouth at bedtime.    Yes [provider]  Calcium Carb-Cholecalciferol (CALCIUM 500+D3 PO) Take by mouth daily.   Yes [provider]  celecoxib (CELEBREX) 200 MG capsule Take 200 mg by mouth every morning.   Yes [provider]  Cholecalciferol 5000 units TABS Take 5,000 Units by mouth at bedtime. Vit D3   Yes [provider]  Docusate Sodium (COLACE PO) Take  by mouth daily.   Yes [provider]  ezetimibe (ZETIA) 10 MG tablet Take 10 mg by mouth at bedtime.    Yes [provider]  LORazepam (ATIVAN) 2 MG tablet Take 2 mg by mouth at bedtime. 08/18/15  Yes [provider]  fenofibrate 160 MG tablet Take 160 mg by mouth daily.    [provider]    Current Facility-Administered Medications  Medication Dose Route Frequency Provider Last Rate Last Dose  . 0.9 %  sodium chloride infusion   Intravenous Continuous Doug Sou, MD 125 mL/hr at 04/07/18 1249    . fentaNYL (SUBLIMAZE) injection 50 mcg  50 mcg Intravenous Q3H PRN Erick Blinks, MD   50 mcg at 04/07/18 1151  . ondansetron (ZOFRAN) injection 4 mg  4 mg Intravenous Q6H PRN Erick Blinks, MD      . pantoprazole (PROTONIX) injection 40 mg  40 mg Intravenous Once Doug Sou, MD      . pantoprazole (PROTONIX) injection 40 mg  40 mg Intravenous Q12H Erick Blinks, MD   40 mg at 04/07/18 1046   Current Outpatient Medications  Medication Sig Dispense Refill  . acetaminophen (TYLENOL) 325 MG tablet Take 650 mg by mouth every 6 (six) hours as needed.    Marland Kitchen aspirin 81 MG tablet Take 81 mg by mouth at bedtime.     Marland Kitchen atorvastatin (LIPITOR) 80 MG tablet Take 80 mg by mouth at bedtime.     . Biotin 1 MG CAPS Take 1 tablet by mouth at bedtime.     . Calcium Carb-Cholecalciferol (CALCIUM 500+D3 PO) Take by mouth daily.    . celecoxib (CELEBREX) 200 MG capsule Take 200 mg by mouth every morning.    . Cholecalciferol 5000 units TABS Take 5,000 Units by mouth at bedtime. Vit D3    . Docusate Sodium (COLACE PO) Take by mouth daily.    Marland Kitchen ezetimibe (ZETIA) 10 MG tablet Take 10 mg by mouth at bedtime.     Marland Kitchen LORazepam (ATIVAN) 2 MG tablet Take 2 mg by mouth at bedtime.  2  . fenofibrate 160 MG tablet Take 160 mg by mouth daily.      Allergies as of 04/07/2018  . (No Known Allergies)    Family History  Problem Relation Age of Onset  . Colon cancer Father 66    . Colon cancer Brother 50  . Other Mother        heavy smoker; died from arterial sclerosis  . Hypertension Sister   . Stomach cancer Neg Hx     Social History   Socioeconomic History  . Marital status: Widowed    Spouse name: Not on file  . Number of children: Not on file  . Years of education: Not on file  . Highest education level: Not on file  Occupational History  . Not on file  Social Needs  . Financial resource strain: Not on file  . Food insecurity:    Worry: Not on file    Inability: Not on file  . Transportation  needs:    Medical: Not on file    Non-medical: Not on file  Tobacco Use  . Smoking status: Never Smoker  . Smokeless tobacco: Never Used  Substance and Sexual Activity  . Alcohol use: Yes    Comment: wine once a week  . Drug use: No  . Sexual activity: Not Currently    Birth control/protection: Post-menopausal  Lifestyle  . Physical activity:    Days per week: Not on file    Minutes per session: Not on file  . Stress: Not on file  Relationships  . Social connections:    Talks on phone: Not on file    Gets together: Not on file    Attends religious service: Not on file    Active member of club or organization: Not on file    Attends meetings of clubs or organizations: Not on file    Relationship status: Not on file  . Intimate partner violence:    Fear of current or ex partner: Not on file    Emotionally abused: Not on file    Physically abused: Not on file    Forced sexual activity: Not on file  Other Topics Concern  . Not on file  Social History Narrative  . Not on file    Review of Systems: As in history of present illness.   Physical Exam: Vital signs in last 24 hours: Temp:  [97.5 F (36.4 C)] 97.5 F (36.4 C) (01/26 0623) Pulse Rate:  [55-57] 55 (01/26 1146) Resp:  [18-19] 19 (01/26 1146) BP: (159-185)/(64-75) 185/75 (01/26 1146) SpO2:  [99 %] 99 % (01/26 1146) Weight:  [68 kg] 68 kg (01/26 0624)   General:   Patient is  alert and comfortable.  Accompanied by her nephew.  She was seen and examined in ED room 19   Eyes:  Sclera clear, no icterus.   Conjunctiva pink. Lungs:  Clear throughout to auscultation.   No wheezes, crackles, or rhonchi. No acute distress. Heart:  Regular rate and rhythm; no murmurs, clicks, rubs,  or gallops. Abdomen: Nondistended.  Positive bowel sounds.  She has localized tenderness to the left of the umbilicus and in the medial left lower quadrant.  No guarding or rebound at this time.  No obvious mass organomegaly.  Intake/Output from previous day: No intake/output data recorded. Intake/Output this shift: Total I/O In: 363.6 [I.V.:263.3; IV Piggyback:100.2] Out: -   Lab Results: Recent Labs    04/07/18 0645  WBC 11.6*  HGB 12.5  HCT 37.9  PLT 242   BMET Recent Labs    04/07/18 0645  NA 139  K 3.3*  CL 106  CO2 23  GLUCOSE 124*  BUN 14  CREATININE 0.74  CALCIUM 9.5   LFT Recent Labs    04/07/18 0645  PROT 7.7  ALBUMIN 4.3  AST 20  ALT 15  ALKPHOS 31*  BILITOT 1.2   PT/INR No results for input(s): LABPROT, INR in the last 72 hours. Hepatitis Panel No results for input(s): HEPBSAG, HCVAB, HEPAIGM, HEPBIGM in the last 72 hours. C-Diff No results for input(s): CDIFFTOX in the last 72 hours.  Studies/Results: Ct Abdomen Pelvis W Contrast  Result Date: 04/07/2018 CLINICAL DATA:  Periumbilical pain with vomiting EXAM: CT ABDOMEN AND PELVIS WITH CONTRAST TECHNIQUE: Multidetector CT imaging of the abdomen and pelvis was performed using the standard protocol following bolus administration of intravenous contrast. CONTRAST:  ISOVUE-300 IOPAMIDOL (ISOVUE-300) INJECTION 61% COMPARISON:  None. FINDINGS: Lower chest:  No acute  finding.  Lad atherosclerotic calcification. Hepatobiliary: Small hepatic cystic densities.No evidence of biliary obstruction or stone. Pancreas: Unremarkable. Spleen: Unremarkable. Adrenals/Urinary Tract: Negative adrenals. No  hydronephrosis or stone. Unremarkable bladder. Stomach/Bowel: Marked submucosal low-density thickening of the stomach wall with patchy submucosal pneumatosis. Suspect this is peptic ulcer disease, there was scarring probably from prior peptic ulcer disease on a EGD in 2018. Updated EGD or follow-up imaging will be needed to exclude neoplastic process. There is mild fat haziness around the affected area with enlarged Peri antral gastric lymph nodes. Vascular/Lymphatic: Atherosclerotic calcification. No acute vascular finding . Reproductive:Benign right adnexal calcification. 15 mm right adnexal simple appearing cystic density, stable from lumbar MRI 09/02/2013 Other: No ascites or pneumoperitoneum. Musculoskeletal: Diffuse advanced degenerative disease of the lumbar in thoracic spine. There is bulky facet spurring at T9-10 and T10-11, with presumed cord impingement. Bilateral hip arthroplasty. IMPRESSION: 1. Marked submucosal low-density thickening of the distal stomach with pneumatosis, likely inflammatory and from peptic ulcer disease. No extraluminal perforation. Recommend updated EGD or CT after convalescence. 2. Notably advanced spinal degeneration. Facet spurs at T9-10 and T10-11 likely compress the cord. Electronically Signed   By: Marnee SpringJonathon  Watts M.D.   On: 04/07/2018 09:32   Impression: Cynthia 68 year old Cochran presenting with acute onset of nausea, vomiting and abdominal pain.  Significant NSAID exposure.  I personally reviewed CT scan images which reveals an abnormal stomach with thickening of the gastric wall and pneumatosis.  In the context of this clinical presentation, we are likely dealing with active peptic ulcer disease with impending perforation.  However, no free air seen on the scan.  Less likely would be a chronic infiltrating process.  Recommendations:   Admit.  N.p.o.  Twice daily IV PPI therapy.  Will hold off on NG tube and antibiotics at this time.  Follow closely clinically.  Withhold  all NSAIDs.  Repeat CBC tomorrow morning.  Judicious use of analgesics taking care not to mask degree of abdominal pain.  If she deteriorates clinically, would go directly to surgical consultation.  EGD relatively contraindicated at this time (although she will need one at some point in the future).  Replete potassium per attending.  She will be seen in the morning by Dr. Karilyn Cotaehman.  I have discussed with Dr. Kerry HoughMemon earlier today.      Notice:  This dictation was prepared with Dragon dictation along with smaller phrase technology. Any transcriptional errors that result from this process are unintentional and may not be corrected upon review.

## 2018-04-07 NOTE — Progress Notes (Signed)
Pt refuses SCD's.  Pt educated on risks involved, continues to refuse.  SCD's not placed per pt request.  [id number]

## 2018-04-07 NOTE — ED Provider Notes (Signed)
MSE was initiated and I personally evaluated the patient and placed orders (if any) at  6:21 AM on April 07, 2018.  The patient appears stable so that the remainder of the MSE may be completed by another provider.  Patient states at noon on January 25 she started having periumbilical abdominal pain that would come and go but mainly it was constant.  She states when it went away it was only brief.  She states the pain is aching and has intermittent episodes of sharpness.  She has had persistent nausea but is only vomited 3 times.  She denies diarrhea.  She denies feeling bloated.  She states she cannot stand to have the pants waistband around her abdomen.  She denies anything different yesterday as far as what she ate or what she was exposed to.  She states she is never had this pain before.  She does states she had a C-section in the past but no other abdominal surgery.  Patient is awake and looks distress, she is pale.  Her tongue is dry.  When I examine her abdomen she has bowel sounds, her abdomen is soft without guarding or rebound.  She has her hand over her umbilicus area.  She does not appear to be distended.  IV fluids, IV pain and nausea medicine was ordered, lab work was ordered and CT abdomen was done to further evaluate her complaints of pain.   Devoria Albe, MD 04/07/18 [phone]

## 2018-04-07 NOTE — Progress Notes (Signed)
Patient states pain is still 8/10 but does not want to take morphine at this time.

## 2018-04-07 NOTE — ED Provider Notes (Signed)
[facility] EMERGENCY DEPARTMENT Provider Note   CSN: [id number] Arrival date & time: 04/07/18  0609     History   Chief Complaint Chief Complaint  Patient presents with  . Abdominal Pain    vomiting    HPI Cynthia Cochran is a 68 y.o. female.  Complains of mid abdominal pain at approximately her waistline started 11:30 AM yesterday preceded by 3 episodes of vomiting a few minutes prior to onset of pain.  Last bowel movement was yesterday, normal she denies any urinary symptoms she treated herself with Pepto-Bismol without relief.  On arrival she complained of nausea and abdominal pain.  Nothing makes symptoms better or worse.  She feels much improved since treatment with intravenous fentanyl, intravenous Zofran and IV normal saline prior to my exam.  HPI  Past Medical History:  Diagnosis Date  . Anxiety   . Arthritis   . Blood transfusion without reported diagnosis 1982   after c section  . Dysphagia 04/25/2016  . High cholesterol 04/25/2016  . Hyperlipidemia   . Mixed stress and urge urinary incontinence 09/09/2015  . Sciatic nerve pain     Patient Active Problem List   Diagnosis Date Noted  . Family hx of colon cancer 02/27/2018  . Screening for colorectal cancer 11/06/2017  . Encounter for well woman exam with routine gynecological exam 11/06/2017  . High cholesterol 04/25/2016  . Dysphagia 04/25/2016  . Esophageal dysphagia 04/25/2016  . Mixed stress and urge urinary incontinence 09/09/2015    Past Surgical History:  Procedure Laterality Date  . BIOPSY  05/12/2016   Procedure: BIOPSY;  Surgeon: Malissa Hippo, MD;  Location: [facility];  Service: Endoscopy;;  gastric  . CARPAL TUNNEL RELEASE     right; 1998, left 1998  . CESAREAN SECTION  1982  . Colonscopy    . ESOPHAGEAL DILATION N/A 05/12/2016   Procedure: ESOPHAGEAL DILATION;  Surgeon: Malissa Hippo, MD;  Location: [facility];  Service: Endoscopy;  Laterality: N/A;  . ESOPHAGOGASTRODUODENOSCOPY N/A  05/12/2016   Procedure: ESOPHAGOGASTRODUODENOSCOPY (EGD);  Surgeon: Malissa Hippo, MD;  Location: [facility];  Service: Endoscopy;  Laterality: N/A;  9:30  . TOTAL HIP ARTHROPLASTY     right; March 2013; left March 2011     OB History    Gravida  3   Para  1   Term      Preterm  1   AB  2   Living  0     SAB  2   TAB      Ectopic      Multiple      Live Births               Home Medications    Prior to Admission medications   Medication Sig Start Date End Date Taking? Authorizing Provider  aspirin 81 MG tablet Take 81 mg by mouth at bedtime.     [provider]  atorvastatin (LIPITOR) 80 MG tablet Take 80 mg by mouth at bedtime.  12/14/11   [provider]  Biotin 1 MG CAPS Take 1 tablet by mouth at bedtime.     [provider]  Calcium Carb-Cholecalciferol (CALCIUM 500+D3 PO) Take by mouth daily.    [provider]  Cholecalciferol 5000 units TABS Take 5,000 Units by mouth at bedtime. Vit D3    [provider]  Docusate Sodium (COLACE PO) Take by mouth daily.    [provider]  ezetimibe (  ZETIA) 10 MG tablet Take 10 mg by mouth at bedtime.     [provider]  fenofibrate 160 MG tablet Take 160 mg by mouth daily.    [provider]  LORazepam (ATIVAN) 2 MG tablet Take 2 mg by mouth at bedtime. 08/18/15   [provider]  Multiple Vitamin (MULTIVITAMIN) tablet Take 1 tablet by mouth at bedtime.     [provider]    Family History Family History  Problem Relation Age of Onset  . Colon cancer Father 5164  . Colon cancer Brother 50  . Other Mother        heavy smoker; died from arterial sclerosis  . Hypertension Sister   . Stomach cancer Neg Hx     Social History Social History   Tobacco Use  . Smoking status: Never Smoker  . Smokeless tobacco: Never Used  Substance Use Topics  . Alcohol use: Yes    Comment: wine once a week  . Drug use: No     Allergies    Patient has no known allergies.   Review of Systems Review of Systems  Constitutional: Negative.   HENT: Negative.   Respiratory: Negative.   Cardiovascular: Negative.   Gastrointestinal: Positive for abdominal pain, nausea and vomiting.  Musculoskeletal: Negative.   Skin: Negative.   Neurological: Negative.   Psychiatric/Behavioral: Negative.   All other systems reviewed and are negative.    Physical Exam Updated Vital Signs BP (!) 159/64 (BP Location: Right Arm)   Pulse (!) 57   Temp (!) 97.5 F (36.4 C) (Oral)   Resp 18   Ht 5\' 2"  (1.575 m)   Wt 68 kg   SpO2 99%   BMI 27.44 kg/m   Physical Exam Vitals signs and nursing note reviewed.  Constitutional:      Appearance: Normal appearance. She is well-developed. She is not ill-appearing.  HENT:     Head: Normocephalic and atraumatic.  Eyes:     Conjunctiva/sclera: Conjunctivae normal.     Pupils: Pupils are equal, round, and reactive to light.  Neck:     Musculoskeletal: Neck supple.     Thyroid: No thyromegaly.     Trachea: No tracheal deviation.  Cardiovascular:     Rate and Rhythm: Normal rate and regular rhythm.     Heart sounds: No murmur.  Pulmonary:     Effort: Pulmonary effort is normal.     Breath sounds: Normal breath sounds.  Abdominal:     General: Bowel sounds are normal. There is no distension.     Palpations: Abdomen is soft. There is no mass.     Tenderness: There is abdominal tenderness. There is no guarding.     Comments: Minimally tender at periumbilical area  Musculoskeletal: Normal range of motion.        General: No tenderness.  Skin:    General: Skin is warm and dry.     Findings: No rash.  Neurological:     Mental Status: She is alert.     Coordination: Coordination normal.    Results for orders placed or performed during the hospital encounter of 04/07/18  Comprehensive metabolic panel  Result Value Ref Range   Sodium 139 135 - 145 mmol/L   Potassium 3.3 (L) 3.5 - 5.1  mmol/L   Chloride 106 98 - 111 mmol/L   CO2 23 22 - 32 mmol/L   Glucose, Bld 124 (H) 70 - 99 mg/dL   BUN 14 8 - 23 mg/dL  Creatinine, Ser 0.74 0.44 - 1.00 mg/dL   Calcium 9.5 8.9 - 19.1 mg/dL   Total Protein 7.7 6.5 - 8.1 g/dL   Albumin 4.3 3.5 - 5.0 g/dL   AST 20 15 - 41 U/L   ALT 15 0 - 44 U/L   Alkaline Phosphatase 31 (L) 38 - 126 U/L   Total Bilirubin 1.2 0.3 - 1.2 mg/dL   GFR calc non Af Amer >60 >60 mL/min   GFR calc Af Amer >60 >60 mL/min   Anion gap 10 5 - 15  CBC with Differential  Result Value Ref Range   WBC 11.6 (H) 4.0 - 10.5 K/uL   RBC 4.18 3.87 - 5.11 MIL/uL   Hemoglobin 12.5 12.0 - 15.0 g/dL   HCT 47.8 29.5 - 62.1 %   MCV 90.7 80.0 - 100.0 fL   MCH 29.9 26.0 - 34.0 pg   MCHC 33.0 30.0 - 36.0 g/dL   RDW 30.8 65.7 - 84.6 %   Platelets 242 150 - 400 K/uL   nRBC 0.0 0.0 - 0.2 %   Neutrophils Relative % 85 %   Neutro Abs 9.8 (H) 1.7 - 7.7 K/uL   Lymphocytes Relative 7 %   Lymphs Abs 0.8 0.7 - 4.0 K/uL   Monocytes Relative 8 %   Monocytes Absolute 0.9 0.1 - 1.0 K/uL   Eosinophils Relative 0 %   Eosinophils Absolute 0.0 0.0 - 0.5 K/uL   Basophils Relative 0 %   Basophils Absolute 0.0 0.0 - 0.1 K/uL   Immature Granulocytes 0 %   Abs Immature Granulocytes 0.04 0.00 - 0.07 K/uL  Lipase, blood  Result Value Ref Range   Lipase 26 11 - 51 U/L  Urinalysis, Routine w reflex microscopic  Result Value Ref Range   Color, Urine STRAW (A) YELLOW   APPearance CLEAR CLEAR   Specific Gravity, Urine >1.046 (H) 1.005 - 1.030   pH 7.0 5.0 - 8.0   Glucose, UA NEGATIVE NEGATIVE mg/dL   Hgb urine dipstick NEGATIVE NEGATIVE   Bilirubin Urine NEGATIVE NEGATIVE   Ketones, ur NEGATIVE NEGATIVE mg/dL   Protein, ur NEGATIVE NEGATIVE mg/dL   Nitrite NEGATIVE NEGATIVE   Leukocytes, UA NEGATIVE NEGATIVE   Ct Abdomen Pelvis W Contrast  Result Date: 04/07/2018 CLINICAL DATA:  Periumbilical pain with vomiting EXAM: CT ABDOMEN AND PELVIS WITH CONTRAST TECHNIQUE: Multidetector CT  imaging of the abdomen and pelvis was performed using the standard protocol following bolus administration of intravenous contrast. CONTRAST:  ISOVUE-300 IOPAMIDOL (ISOVUE-300) INJECTION 61% COMPARISON:  None. FINDINGS: Lower chest:  No acute finding.  Lad atherosclerotic calcification. Hepatobiliary: Small hepatic cystic densities.No evidence of biliary obstruction or stone. Pancreas: Unremarkable. Spleen: Unremarkable. Adrenals/Urinary Tract: Negative adrenals. No hydronephrosis or stone. Unremarkable bladder. Stomach/Bowel: Marked submucosal low-density thickening of the stomach wall with patchy submucosal pneumatosis. Suspect this is peptic ulcer disease, there was scarring probably from prior peptic ulcer disease on a EGD in 2018. Updated EGD or follow-up imaging will be needed to exclude neoplastic process. There is mild fat haziness around the affected area with enlarged Peri antral gastric lymph nodes. Vascular/Lymphatic: Atherosclerotic calcification. No acute vascular finding . Reproductive:Benign right adnexal calcification. 15 mm right adnexal simple appearing cystic density, stable from lumbar MRI 09/02/2013 Other: No ascites or pneumoperitoneum. Musculoskeletal: Diffuse advanced degenerative disease of the lumbar in thoracic spine. There is bulky facet spurring at T9-10 and T10-11, with presumed cord impingement. Bilateral hip arthroplasty. IMPRESSION: 1. Marked submucosal low-density thickening of the distal stomach  with pneumatosis, likely inflammatory and from peptic ulcer disease. No extraluminal perforation. Recommend updated EGD or CT after convalescence. 2. Notably advanced spinal degeneration. Facet spurs at T9-10 and T10-11 likely compress the cord. Electronically Signed   By: Marnee SpringJonathon  Watts M.D.   On: 04/07/2018 09:32  Lab work consistent with mild hypokalemia and mild leukocytosis otherwise normal  ED Treatments / Results  Labs (all labs ordered are listed, but only abnormal  results are displayed) Labs Reviewed  COMPREHENSIVE METABOLIC PANEL - Abnormal; Notable for the following components:      Result Value   Potassium 3.3 (*)    Glucose, Bld 124 (*)    Alkaline Phosphatase 31 (*)    All other components within normal limits  CBC WITH DIFFERENTIAL/PLATELET - Abnormal; Notable for the following components:   WBC 11.6 (*)    Neutro Abs 9.8 (*)    All other components within normal limits  LIPASE, BLOOD  URINALYSIS, ROUTINE W REFLEX MICROSCOPIC    EKG None  Radiology No results found.  Procedures Procedures (including critical care time) Intravenous potassium supplementation ordered Medications Ordered in ED Medications  sodium chloride 0.9 % bolus 1,000 mL (1,000 mLs Intravenous New Bag/Given 04/07/18 0650)  sodium chloride 0.9 % bolus 500 mL (has no administration in time range)  fentaNYL (SUBLIMAZE) injection 50 mcg (50 mcg Intravenous Given 04/07/18 0651)  ondansetron (ZOFRAN) injection 4 mg (4 mg Intravenous Given 04/07/18 0650)     Initial Impression / Assessment and Plan / ED Course  I have reviewed the triage vital signs and the nursing notes.  Pertinent labs & imaging results that were available during my care of the patient were reviewed by me and considered in my medical decision making (see chart for details).     930 am. patient requesting additional pain medicine.  Additional IV fentanyl ordered.  At 10:15 AM pain is well controlled.  I consulted Dr.Rourk patient on CT scan findings and clinical findings.  He is suggesting n.p.o.  Except for ice chips IV Protonix 40 mg twice daily.  He will consult on patient and see patient in the hospital.  He is requesting 23-hour observation with hospitalist service I consulted Dr. Kerry HoughMemon from hospitalist service who will arrange for overnight stay Final Clinical Impressions(s) / ED Diagnoses  Diagnoses #1 generalized abdominal pain Final diagnoses:  None  #2 hypokalemia  ED Discharge Orders      None       Doug SouJacubowitz, Kialee Kham, MD 04/07/18 1047

## 2018-04-08 DIAGNOSIS — D649 Anemia, unspecified: Secondary | ICD-10-CM | POA: Diagnosis present

## 2018-04-08 DIAGNOSIS — Z8711 Personal history of peptic ulcer disease: Secondary | ICD-10-CM | POA: Diagnosis not present

## 2018-04-08 DIAGNOSIS — E7800 Pure hypercholesterolemia, unspecified: Secondary | ICD-10-CM | POA: Diagnosis present

## 2018-04-08 DIAGNOSIS — G8929 Other chronic pain: Secondary | ICD-10-CM | POA: Diagnosis present

## 2018-04-08 DIAGNOSIS — R51 Headache: Secondary | ICD-10-CM | POA: Diagnosis present

## 2018-04-08 DIAGNOSIS — Z8249 Family history of ischemic heart disease and other diseases of the circulatory system: Secondary | ICD-10-CM | POA: Diagnosis not present

## 2018-04-08 DIAGNOSIS — R1033 Periumbilical pain: Secondary | ICD-10-CM | POA: Diagnosis present

## 2018-04-08 DIAGNOSIS — R030 Elevated blood-pressure reading, without diagnosis of hypertension: Secondary | ICD-10-CM | POA: Diagnosis present

## 2018-04-08 DIAGNOSIS — Z96643 Presence of artificial hip joint, bilateral: Secondary | ICD-10-CM | POA: Diagnosis present

## 2018-04-08 DIAGNOSIS — R1310 Dysphagia, unspecified: Secondary | ICD-10-CM | POA: Diagnosis present

## 2018-04-08 DIAGNOSIS — R1032 Left lower quadrant pain: Secondary | ICD-10-CM | POA: Diagnosis not present

## 2018-04-08 DIAGNOSIS — F419 Anxiety disorder, unspecified: Secondary | ICD-10-CM | POA: Diagnosis present

## 2018-04-08 DIAGNOSIS — Z79899 Other long term (current) drug therapy: Secondary | ICD-10-CM | POA: Diagnosis not present

## 2018-04-08 DIAGNOSIS — M1990 Unspecified osteoarthritis, unspecified site: Secondary | ICD-10-CM | POA: Diagnosis present

## 2018-04-08 DIAGNOSIS — M5430 Sciatica, unspecified side: Secondary | ICD-10-CM | POA: Diagnosis present

## 2018-04-08 DIAGNOSIS — Z7982 Long term (current) use of aspirin: Secondary | ICD-10-CM | POA: Diagnosis not present

## 2018-04-08 DIAGNOSIS — E785 Hyperlipidemia, unspecified: Secondary | ICD-10-CM | POA: Diagnosis present

## 2018-04-08 DIAGNOSIS — R1084 Generalized abdominal pain: Secondary | ICD-10-CM | POA: Diagnosis present

## 2018-04-08 DIAGNOSIS — E876 Hypokalemia: Secondary | ICD-10-CM | POA: Diagnosis present

## 2018-04-08 LAB — COMPREHENSIVE METABOLIC PANEL
ALT: 14 U/L (ref 0–44)
AST: 17 U/L (ref 15–41)
Albumin: 3.5 g/dL (ref 3.5–5.0)
Alkaline Phosphatase: 27 U/L — ABNORMAL LOW (ref 38–126)
Anion gap: 6 (ref 5–15)
BUN: 8 mg/dL (ref 8–23)
CO2: 25 mmol/L (ref 22–32)
Calcium: 8.8 mg/dL — ABNORMAL LOW (ref 8.9–10.3)
Chloride: 108 mmol/L (ref 98–111)
Creatinine, Ser: 0.71 mg/dL (ref 0.44–1.00)
GFR calc Af Amer: >60 mL/min (ref >60)
GFR calc non Af Amer: >60 mL/min (ref >60)
Glucose, Bld: 112 mg/dL — ABNORMAL HIGH (ref 70–99)
Potassium: 3.8 mmol/L (ref 3.5–5.1)
Sodium: 139 mmol/L (ref 135–145)
Total Bilirubin: 1 mg/dL (ref 0.3–1.2)
Total Protein: 6.5 g/dL (ref 6.5–8.1)

## 2018-04-08 LAB — TROPONIN I
Troponin I: <0.03 ng/mL (ref <0.03)
Troponin I: <0.03 ng/mL (ref <0.03)

## 2018-04-08 LAB — CBC
HCT: 35.5 % — ABNORMAL LOW (ref 36.0–46.0)
Hemoglobin: 11.2 g/dL — ABNORMAL LOW (ref 12.0–15.0)
MCH: 29.6 pg (ref 26.0–34.0)
MCHC: 31.5 g/dL (ref 30.0–36.0)
MCV: 93.7 fL (ref 80.0–100.0)
Platelets: 187 10*3/uL (ref 150–400)
RBC: 3.79 MIL/uL — ABNORMAL LOW (ref 3.87–5.11)
RDW: 13.1 % (ref 11.5–15.5)
WBC: 7.6 10*3/uL (ref 4.0–10.5)
nRBC: 0 % (ref 0.0–0.2)

## 2018-04-08 MED ORDER — POTASSIUM CHLORIDE IN NACL 40-0.9 MEQ/L-% IV SOLN
INTRAVENOUS | Status: DC
  Administered 2018-04-08 – 2018-04-09 (×2): 100 mL/h via INTRAVENOUS

## 2018-04-08 NOTE — Care Management Obs Status (Signed)
MEDICARE OBSERVATION STATUS NOTIFICATION   Patient Details  Name: Cynthia Cochran MRN: [id number] Date of Birth: 09/22/1950   Medicare Observation Status Notification Given:  Other (see comment)    Renie Ora 04/08/2018, 10:48 AM

## 2018-04-08 NOTE — Progress Notes (Signed)
  Subjective:  Patient complains of headache.  She denies abdominal pain.  She also denies nausea.  She has been passing flatus but has not had a bowel movement.  She states she was fine the evening of 04/05/2018.  All of her pain started on the morning of 04/06/2018.  She had multiple episodes of vomiting but no hematemesis.  Objective: Blood pressure (!) 156/72, pulse (!) 58, temperature 99.1 F (37.3 C), temperature source Oral, resp. rate 18, height '5\' 2"'$  (1.575 m), weight 72 kg, SpO2 100 %. Patient is alert and in no acute distress. Abdomen is symmetrical.  Bowel sounds are normal.  On palpation she has mild midepigastric tenderness without guarding or rebound.  Labs/studies Results:  CBC Latest Ref Rng & Units 04/08/2018 04/07/2018  WBC 4.0 - 10.5 K/uL 7.6 11.6(H)  Hemoglobin 12.0 - 15.0 g/dL 11.2(L) 12.5  Hematocrit 36.0 - 46.0 % 35.5(L) 37.9  Platelets 150 - 400 K/uL 187 242    CMP Latest Ref Rng & Units 04/08/2018 04/07/2018 10/02/2017  Glucose 70 - 99 mg/dL 112(H) 124(H) -  BUN 8 - 23 mg/dL 8 14 -  Creatinine 0.44 - 1.00 mg/dL 0.71 0.74 0.90  Sodium 135 - 145 mmol/L 139 139 -  Potassium 3.5 - 5.1 mmol/L 3.8 3.3(L) -  Chloride 98 - 111 mmol/L 108 106 -  CO2 22 - 32 mmol/L 25 23 -  Calcium 8.9 - 10.3 mg/dL 8.8(L) 9.5 -  Total Protein 6.5 - 8.1 g/dL 6.5 7.7 -  Total Bilirubin 0.3 - 1.2 mg/dL 1.0 1.2 -  Alkaline Phos 38 - 126 U/L 27(L) 31(L) -  AST 15 - 41 U/L 17 20 -  ALT 0 - 44 U/L 14 15 -    Hepatic Function Latest Ref Rng & Units 04/08/2018 04/07/2018  Total Protein 6.5 - 8.1 g/dL 6.5 7.7  Albumin 3.5 - 5.0 g/dL 3.5 4.3  AST 15 - 41 U/L 17 20  ALT 0 - 44 U/L 14 15  Alk Phosphatase 38 - 126 U/L 27(L) 31(L)  Total Bilirubin 0.3 - 1.2 mg/dL 1.0 1.2     Assessment:  #1.  Epigastric pain secondary to continued/penetrating gastric ulcer.  CT reveals antral gastric wall thickening with pneumatosis but no extraluminal gas.  Patient had a EGD in March 2018 revealing erosive  gastritis and biopsy was negative for H. pylori infection.  Peptic ulcer disease felt to be secondary to Celebrex and aspirin use.  Will reevaluate for H. pylori infection.  Symptomatic improvement in last 24 hours.  Since there is no doubt  about diagnosis may delay EGD until 10 weeks or so.  #2.  Mild anemia.  No evidence of active bleed.  Will do Hemoccults.  Recommendations:  Clear liquids today. H. pylori serology. Hemoccults x1. Continue pantoprazole 40 mg IV every 12 hours. CBC in a.m.

## 2018-04-08 NOTE — Progress Notes (Signed)
PROGRESS NOTE    Cynthia Cochran  JGO:[id number] DOB: 01-May-1950 DOA: 04/07/2018 PCP: Benita Stabile, MD    Brief Narrative:  68 year old female admitted to the hospital with abdominal pain.  Imaging indicated gastric wall thickening and pneumatosis on CT abdomen.  There was concern for gastric ulcer with impending perforation.  Started on proton pump inhibitors.  Gastroenterology following.  Advancing diet as tolerated.   Assessment & Plan:   Principal Problem:   Abdominal pain Active Problems:   High cholesterol   Hypokalemia   Osteoarthritis   Elevated blood pressure reading   1. Abdominal pain secondary to penetrating gastric ulcer.  CT of the abdomen indicated gastric wall thickening with pneumatosis but no extraluminal gas.  She is currently on PPI.  She was taking aspirin and Celebrex prior to admission.  She is currently on clear liquids and appears to be tolerating this.  Gastroenterology following.  Advance diet tomorrow if she continues to improve. 2. Hyperlipidemia.  Continue on statin/Zetia. 3. Hypokalemia.  Replaced 4. Elevated blood pressure.  She is not chronically on antihypertensive medication.  Continue to monitor for now.   DVT prophylaxis: SCDs Code Status: Full code Family Communication: Discussed with daughter at the bedside Disposition Plan: Discharge home once improved   Consultants:   Gastroenterology  Procedures:     Antimicrobials:       Subjective: Less abdominal pain today.  No nausea or vomiting.  Objective: Vitals:   04/07/18 1500 04/07/18 1648 04/08/18 0931 04/08/18 1422  BP:  (!) 156/72  (!) 158/75  Pulse:  (!) 58  (!) 58  Resp:  18  18  Temp:  99.1 F (37.3 C)  98.3 F (36.8 C)  TempSrc:  Oral  Oral  SpO2:  99% 100% 99%  Weight: 72 kg     Height:        Intake/Output Summary (Last 24 hours) at 04/08/2018 1909 Last data filed at 04/08/2018 1700 Gross per 24 hour  Intake 1178.33 ml  Output -  Net 1178.33 ml   Filed  Weights   04/07/18 0624 04/07/18 1500  Weight: 68 kg 72 kg    Examination:  General exam: Appears calm and comfortable  Respiratory system: Clear to auscultation. Respiratory effort normal. Cardiovascular system: S1 & S2 heard, RRR. No JVD, murmurs, rubs, gallops or clicks. No pedal edema. Gastrointestinal system: Abdomen is nondistended, soft and nontender. No organomegaly or masses felt. Normal bowel sounds heard. Central nervous system: Alert and oriented. No focal neurological deficits. Extremities: Symmetric 5 x 5 power. Skin: No rashes, lesions or ulcers Psychiatry: Judgement and insight appear normal. Mood & affect appropriate.     Data Reviewed: I have personally reviewed following labs and imaging studies  CBC: Recent Labs  Lab 04/07/18 0645 04/08/18 0415  WBC 11.6* 7.6  NEUTROABS 9.8*  --   HGB 12.5 11.2*  HCT 37.9 35.5*  MCV 90.7 93.7  PLT 242 187   Basic Metabolic Panel: Recent Labs  Lab 04/07/18 0645 04/08/18 0415  NA 139 139  K 3.3* 3.8  CL 106 108  CO2 23 25  GLUCOSE 124* 112*  BUN 14 8  CREATININE 0.74 0.71  CALCIUM 9.5 8.8*   GFR: Estimated Creatinine Clearance: 63.5 mL/min (by C-G formula based on SCr of 0.71 mg/dL). Liver Function Tests: Recent Labs  Lab 04/07/18 0645 04/08/18 0415  AST 20 17  ALT 15 14  ALKPHOS 31* 27*  BILITOT 1.2 1.0  PROT 7.7 6.5  ALBUMIN 4.3  3.5   Recent Labs  Lab 04/07/18 0645  LIPASE 26   No results for input(s): AMMONIA in the last 168 hours. Coagulation Profile: No results for input(s): INR, PROTIME in the last 168 hours. Cardiac Enzymes: Recent Labs  Lab 04/07/18 2158 04/08/18 0415 04/08/18 0925  TROPONINI <0.03 <0.03 <0.03   BNP (last 3 results) No results for input(s): PROBNP in the last 8760 hours. HbA1C: No results for input(s): HGBA1C in the last 72 hours. CBG: No results for input(s): GLUCAP in the last 168 hours. Lipid Profile: No results for input(s): CHOL, HDL, LDLCALC, TRIG,  CHOLHDL, LDLDIRECT in the last 72 hours. Thyroid Function Tests: No results for input(s): TSH, T4TOTAL, FREET4, T3FREE, THYROIDAB in the last 72 hours. Anemia Panel: No results for input(s): VITAMINB12, FOLATE, FERRITIN, TIBC, IRON, RETICCTPCT in the last 72 hours. Sepsis Labs: No results for input(s): PROCALCITON, LATICACIDVEN in the last 168 hours.  No results found for this or any previous visit (from the past 240 hour(s)).       Radiology Studies: Ct Abdomen Pelvis W Contrast  Result Date: 04/07/2018 CLINICAL DATA:  Periumbilical pain with vomiting EXAM: CT ABDOMEN AND PELVIS WITH CONTRAST TECHNIQUE: Multidetector CT imaging of the abdomen and pelvis was performed using the standard protocol following bolus administration of intravenous contrast. CONTRAST:  ISOVUE-300 IOPAMIDOL (ISOVUE-300) INJECTION 61% COMPARISON:  None. FINDINGS: Lower chest:  No acute finding.  Lad atherosclerotic calcification. Hepatobiliary: Small hepatic cystic densities.No evidence of biliary obstruction or stone. Pancreas: Unremarkable. Spleen: Unremarkable. Adrenals/Urinary Tract: Negative adrenals. No hydronephrosis or stone. Unremarkable bladder. Stomach/Bowel: Marked submucosal low-density thickening of the stomach wall with patchy submucosal pneumatosis. Suspect this is peptic ulcer disease, there was scarring probably from prior peptic ulcer disease on a EGD in 2018. Updated EGD or follow-up imaging will be needed to exclude neoplastic process. There is mild fat haziness around the affected area with enlarged Peri antral gastric lymph nodes. Vascular/Lymphatic: Atherosclerotic calcification. No acute vascular finding . Reproductive:Benign right adnexal calcification. 15 mm right adnexal simple appearing cystic density, stable from lumbar MRI 09/02/2013 Other: No ascites or pneumoperitoneum. Musculoskeletal: Diffuse advanced degenerative disease of the lumbar in thoracic spine. There is bulky facet spurring  at T9-10 and T10-11, with presumed cord impingement. Bilateral hip arthroplasty. IMPRESSION: 1. Marked submucosal low-density thickening of the distal stomach with pneumatosis, likely inflammatory and from peptic ulcer disease. No extraluminal perforation. Recommend updated EGD or CT after convalescence. 2. Notably advanced spinal degeneration. Facet spurs at T9-10 and T10-11 likely compress the cord. Electronically Signed   By: Marnee Spring M.D.   On: 04/07/2018 09:32        Scheduled Meds: .  morphine injection  2 mg Intravenous Once  . pantoprazole (PROTONIX) IV  40 mg Intravenous Once  . pantoprazole (PROTONIX) IV  40 mg Intravenous Q12H   Continuous Infusions: . 0.9 % NaCl with KCl 40 mEq / L 100 mL/hr (04/08/18 0737)     LOS: 0 days    Time spent:    Erick Blinks, MD [facility]   If 7PM-7AM, please contact night-coverage [email or web address]  04/08/2018, 7:09 PM

## 2018-04-09 LAB — H. PYLORI ANTIBODY, IGG: H Pylori IgG: 0.25 Index Value (ref 0.00–0.79)

## 2018-04-09 LAB — CBC
HCT: 32.5 % — ABNORMAL LOW (ref 36.0–46.0)
Hemoglobin: 10.3 g/dL — ABNORMAL LOW (ref 12.0–15.0)
MCH: 30 pg (ref 26.0–34.0)
MCHC: 31.7 g/dL (ref 30.0–36.0)
MCV: 94.8 fL (ref 80.0–100.0)
Platelets: 180 10*3/uL (ref 150–400)
RBC: 3.43 MIL/uL — ABNORMAL LOW (ref 3.87–5.11)
RDW: 12.8 % (ref 11.5–15.5)
WBC: 4.9 10*3/uL (ref 4.0–10.5)
nRBC: 0 % (ref 0.0–0.2)

## 2018-04-09 LAB — HIV ANTIBODY (ROUTINE TESTING W REFLEX): HIV Screen 4th Generation wRfx: NONREACTIVE

## 2018-04-09 NOTE — Progress Notes (Signed)
Patient had no pain or nausea with full liquids. H. pylori serology is negative. We will advance diet to heart healthy. IV fluid rate changed to 50 mL/h.

## 2018-04-09 NOTE — Progress Notes (Signed)
  Subjective:  Patient continues to complain of headache for which she is getting Tylenol.  She is trying to eat full liquids.  She has noted mild discomfort but no nausea.  She continues to complain of left sided chest pain.  She states pain eases when she receives pantoprazole infusion.  Objective: Blood pressure (!) 150/72, pulse (!) 58, temperature 98.6 F (37 C), temperature source Oral, resp. rate 18, height _0  (1.575 m), weight 72 kg, SpO2 98 %. Patient is alert and in no acute distress. Cardiac exam with regular rhythm normal S1 and S2.  She has faint systolic murmur best heard at left sternal border. Auscultation lungs reveal vesicular breath sounds bilaterally. Abdomen is symmetrical.  Bowel sounds are normal.  On palpation abdomen is soft.  She has mild midepigastric tenderness.  No organomegaly or masses  Labs/studies Results:   CBC Latest Ref Rng & Units 04/09/2018 04/08/2018 04/07/2018  WBC 4.0 - 10.5 K/uL 4.9 7.6 11.6(H)  Hemoglobin 12.0 - 15.0 g/dL 10.3(L) 11.2(L) 12.5  Hematocrit 36.0 - 46.0 % 32.5(L) 35.5(L) 37.9  Platelets 150 - 400 K/uL 180 187 242    CMP Latest Ref Rng & Units 04/08/2018 04/07/2018 10/02/2017  Glucose 70 - 99 mg/dL 112(H) 124(H) -  BUN 8 - 23 mg/dL 8 14 -  Creatinine 0.44 - 1.00 mg/dL 0.71 0.74 0.90  Sodium 135 - 145 mmol/L 139 139 -  Potassium 3.5 - 5.1 mmol/L 3.8 3.3(L) -  Chloride 98 - 111 mmol/L 108 106 -  CO2 22 - 32 mmol/L 25 23 -  Calcium 8.9 - 10.3 mg/dL 8.8(L) 9.5 -  Total Protein 6.5 - 8.1 g/dL 6.5 7.7 -  Total Bilirubin 0.3 - 1.2 mg/dL 1.0 1.2 -  Alkaline Phos 38 - 126 U/L 27(L) 31(L) -  AST 15 - 41 U/L 17 20 -  ALT 0 - 44 U/L 14 15 -    Hepatic Function Latest Ref Rng & Units 04/08/2018 04/07/2018  Total Protein 6.5 - 8.1 g/dL 6.5 7.7  Albumin 3.5 - 5.0 g/dL 3.5 4.3  AST 15 - 41 U/L 17 20  ALT 0 - 44 U/L 14 15  Alk Phosphatase 38 - 126 U/L 27(L) 31(L)  Total Bilirubin 0.3 - 1.2 mg/dL 1.0 1.2     Assessment:  #1.  Acute onset  of epigastric pain with nausea and vomiting secondary to deep penetrating antral ulcer with pneumatosis but no free perforation.  She is tolerating clear liquids.  H. pylori serology is pending.  Gastric ulcer suspected to be due to NSAID use.  She has been on celecoxib and low-dose aspirin until this admission.  #2.  Mild anemia.  No evidence of active bleed.  Hemoccult is still pending.  #3.  Atypical chest pain felt to be due to peptic ulcer disease and secondary GERD.  Troponin levels have been normal.  Recommendations:  Diet advanced to full liquids.  If she does well will advance diet to heart healthy diet this evening. Continue pantoprazole 40 mg IV every 12 hours while hospitalized.  She will be transitioned to oral route at the time of discharge.  CBC in a.m. If she does well she should be able to go home tomorrow.  H Will plan EGD in 10 weeks or so to document complete healing.

## 2018-04-09 NOTE — Progress Notes (Signed)
PROGRESS NOTE    Cynthia Cochran  CBJ:[id number] DOB: [birth year] DOA: 04/07/2018 PCP: Benita Stabile, MD    Brief Narrative:  68 year old female admitted to the hospital with abdominal pain.  Imaging indicated gastric wall thickening and pneumatosis on CT abdomen.  There was concern for gastric ulcer with impending perforation.  Started on proton pump inhibitors.  Gastroenterology following.  Advancing diet as tolerated.   Assessment & Plan:   Principal Problem:   Abdominal pain Active Problems:   High cholesterol   Hypokalemia   Osteoarthritis   Elevated blood pressure reading   1. Abdominal pain secondary to penetrating gastric ulcer.  CT of the abdomen indicated gastric wall thickening with pneumatosis but no extraluminal gas.  She is currently on PPI.  She was taking aspirin and Celebrex prior to admission.  She is currently on full liquids and appears to be tolerating this.  Gastroenterology following.  Advance diet to solid food, and if she tolerates this, can likely discharge home in a.m. 2. Hyperlipidemia.  Continue on statin/Zetia. 3. Hypokalemia.  Replaced 4. Elevated blood pressure.  She is not chronically on antihypertensive medication.  Continue to monitor for now.   DVT prophylaxis: SCDs Code Status: Full code Family Communication: Discussed patient Disposition Plan: Discharge home once improved, likely in a.m.   Consultants:   Gastroenterology  Procedures:     Antimicrobials:       Subjective: Tolerating full liquids.  No vomiting.  Abdominal pain is better.  Objective: Vitals:   04/08/18 1422 04/08/18 2100 04/08/18 2131 04/09/18 0538  BP: (!) 158/75  (!) 161/78 (!) 150/72  Pulse: (!) 58  (!) 59 (!) 58  Resp: 18     Temp: 98.3 F (36.8 C)  98.4 F (36.9 C) 98.6 F (37 C)  TempSrc: Oral  Oral Oral  SpO2: 99% 96% 98% 98%  Weight:      Height:        Intake/Output Summary (Last 24 hours) at 04/09/2018 1847 Last data filed at 04/09/2018  1700 Gross per 24 hour  Intake 960 ml  Output -  Net 960 ml   Filed Weights   04/07/18 0624 04/07/18 1500  Weight: 68 kg 72 kg    Examination:  General exam: Alert, awake, oriented x 3 Respiratory system: Clear to auscultation. Respiratory effort normal. Cardiovascular system:RRR. No murmurs, rubs, gallops. Gastrointestinal system: Abdomen is nondistended, soft and nontender. No organomegaly or masses felt. Normal bowel sounds heard. Central nervous system: Alert and oriented. No focal neurological deficits. Extremities: No C/C/E, +pedal pulses Skin: No rashes, lesions or ulcers Psychiatry: Judgement and insight appear normal. Mood & affect appropriate.    Data Reviewed: I have personally reviewed following labs and imaging studies  CBC: Recent Labs  Lab 04/07/18 0645 04/08/18 0415 04/09/18 0452  WBC 11.6* 7.6 4.9  NEUTROABS 9.8*  --   --   HGB 12.5 11.2* 10.3*  HCT 37.9 35.5* 32.5*  MCV 90.7 93.7 94.8  PLT 242 187 180   Basic Metabolic Panel: Recent Labs  Lab 04/07/18 0645 04/08/18 0415  NA 139 139  K 3.3* 3.8  CL 106 108  CO2 23 25  GLUCOSE 124* 112*  BUN 14 8  CREATININE 0.74 0.71  CALCIUM 9.5 8.8*   GFR: Estimated Creatinine Clearance: 63.5 mL/min (by C-G formula based on SCr of 0.71 mg/dL). Liver Function Tests: Recent Labs  Lab 04/07/18 0645 04/08/18 0415  AST 20 17  ALT 15 14  ALKPHOS 31* 27*  BILITOT 1.2 1.0  PROT 7.7 6.5  ALBUMIN 4.3 3.5   Recent Labs  Lab 04/07/18 0645  LIPASE 26   No results for input(s): AMMONIA in the last 168 hours. Coagulation Profile: No results for input(s): INR, PROTIME in the last 168 hours. Cardiac Enzymes: Recent Labs  Lab 04/07/18 2158 04/08/18 0415 04/08/18 0925  TROPONINI <0.03 <0.03 <0.03   BNP (last 3 results) No results for input(s): PROBNP in the last 8760 hours. HbA1C: No results for input(s): HGBA1C in the last 72 hours. CBG: No results for input(s): GLUCAP in the last 168  hours. Lipid Profile: No results for input(s): CHOL, HDL, LDLCALC, TRIG, CHOLHDL, LDLDIRECT in the last 72 hours. Thyroid Function Tests: No results for input(s): TSH, T4TOTAL, FREET4, T3FREE, THYROIDAB in the last 72 hours. Anemia Panel: No results for input(s): VITAMINB12, FOLATE, FERRITIN, TIBC, IRON, RETICCTPCT in the last 72 hours. Sepsis Labs: No results for input(s): PROCALCITON, LATICACIDVEN in the last 168 hours.  No results found for this or any previous visit (from the past 240 hour(s)).       Radiology Studies: No results found.      Scheduled Meds: .  morphine injection  2 mg Intravenous Once  . pantoprazole (PROTONIX) IV  40 mg Intravenous Once  . pantoprazole (PROTONIX) IV  40 mg Intravenous Q12H   Continuous Infusions: . 0.9 % NaCl with KCl 40 mEq / L 50 mL/hr (04/09/18 1440)     LOS: 1 day    Time spent:    Erick Blinks, MD [facility]   If 7PM-7AM, please contact night-coverage [email or web address]  04/09/2018, 6:47 PM

## 2018-04-10 DIAGNOSIS — R1084 Generalized abdominal pain: Principal | ICD-10-CM

## 2018-04-10 MED ORDER — PANTOPRAZOLE SODIUM 40 MG PO TBEC: 40.0000 mg | DELAYED_RELEASE_TABLET | Freq: Two times a day (BID) | ORAL | 1 refills | Status: DC

## 2018-04-10 MED ORDER — DICLOFENAC SODIUM 1 % TD GEL: 2.0000 g | Freq: Four times a day (QID) | TRANSDERMAL | 0 refills | Status: DC

## 2018-04-10 MED ORDER — FAMOTIDINE 20 MG PO TABS: 20.0000 mg | ORAL_TABLET | Freq: Two times a day (BID) | ORAL | 1 refills | Status: DC | PRN

## 2018-04-10 NOTE — Progress Notes (Addendum)
Patient ID: Cynthia Cochran, female   DOB: Aug 05, 1950, 68 y.o.   MRN: [id number] Sitting up in chair. States feels better. Protonix is helping.  Mild epigastric tenderness. May be discharged from our stand point. Will need Protonix 40mg  BID. OV in 4 weeks.   GI attending note:  Patient has no complaints. Patient advised to call office if she has epigastric pain nausea vomiting. Patient is aware not to take aspirin or other NSAIDs until further notice. We will plan EGD in about 10 weeks.

## 2018-04-10 NOTE — Discharge Summary (Signed)
Physician Discharge Summary  Cynthia HumbleKathy K Cochran WUJ:[id number] DOB: 11/02/1950 DOA: 04/07/2018  PCP: Cynthia StabileHall, John Z, MD  Admit date: 04/07/2018 Discharge date: 04/10/2018  Admitted From: Home Disposition: Home  Recommendations for Outpatient Follow-up:  1. Follow up with PCP in 1-2 weeks 2. Please obtain BMP/CBC in one week 3. Patient will follow-up with GI in the next 4 weeks.  Plan is for EGD in the next 10 weeks.  Discharge Condition: Stable CODE STATUS: Full code Diet recommendation: Heart healthy  Brief/Interim Summary: 68 year old female admitted to the hospital with abdominal pain.  She had been taking aspirin and Celebrex as an outpatient.  Imaging indicated gastric wall thickening and pneumatosis on CT abdomen.  There was concern for underlying gastric ulcer with impending perforation.  Patient was kept n.p.o. and started on proton pump inhibitors.  Her overall abdominal pain has improved.  She was seen by GI.  H. pylori antigen was found to be negative.  Her diet was slowly advanced to solid food and she appears to be tolerating this.  Overall abdominal pain is better.  She is felt stable for discharge home.  She will follow-up with GI in the next 4 weeks with plans for EGD in 10 weeks to ensure improvement of gastric ulcer.  Discharge Diagnoses:  Principal Problem:   Abdominal pain Active Problems:   High cholesterol   Hypokalemia   Osteoarthritis   Elevated blood pressure reading    Discharge Instructions  Discharge Instructions    Diet - low sodium heart healthy   Complete by:  As directed    Increase activity slowly   Complete by:  As directed      Allergies as of 04/10/2018   No Known Allergies     Medication List    STOP taking these medications   aspirin 81 MG tablet   Biotin 1 MG Caps   CALCIUM 500+D3 PO   celecoxib 200 MG capsule Commonly known as:  CELEBREX   Cholecalciferol 125 MCG (5000 UT) Tabs     TAKE these medications   acetaminophen 325 MG  tablet Commonly known as:  TYLENOL Take 650 mg by mouth every 6 (six) hours as needed.   atorvastatin 80 MG tablet Commonly known as:  LIPITOR Take 80 mg by mouth at bedtime.   COLACE PO Take by mouth daily.   diclofenac sodium 1 % Gel Commonly known as:  VOLTAREN Apply 2 g topically 4 (four) times daily.   ezetimibe 10 MG tablet Commonly known as:  ZETIA Take 10 mg by mouth at bedtime.   famotidine 20 MG tablet Commonly known as:  PEPCID Take 1 tablet (20 mg total) by mouth 2 (two) times daily as needed for heartburn or indigestion.   fenofibrate 160 MG tablet Take 160 mg by mouth daily.   LORazepam 2 MG tablet Commonly known as:  ATIVAN Take 2 mg by mouth at bedtime.   pantoprazole 40 MG tablet Commonly known as:  PROTONIX Take 1 tablet (40 mg total) by mouth 2 (two) times daily before a meal.       No Known Allergies  Consultations:  Gastroenterology   Procedures/Studies: Ct Abdomen Pelvis W Contrast  Result Date: 04/07/2018 CLINICAL DATA:  Periumbilical pain with vomiting EXAM: CT ABDOMEN AND PELVIS WITH CONTRAST TECHNIQUE: Multidetector CT imaging of the abdomen and pelvis was performed using the standard protocol following bolus administration of intravenous contrast. CONTRAST:  100mL ISOVUE-300 IOPAMIDOL (ISOVUE-300) INJECTION 61% COMPARISON:  None. FINDINGS: Lower chest:  No  acute finding.  Lad atherosclerotic calcification. Hepatobiliary: Small hepatic cystic densities.No evidence of biliary obstruction or stone. Pancreas: Unremarkable. Spleen: Unremarkable. Adrenals/Urinary Tract: Negative adrenals. No hydronephrosis or stone. Unremarkable bladder. Stomach/Bowel: Marked submucosal low-density thickening of the stomach wall with patchy submucosal pneumatosis. Suspect this is peptic ulcer disease, there was scarring probably from prior peptic ulcer disease on a EGD in 2018. Updated EGD or follow-up imaging will be needed to exclude neoplastic process. There is  mild fat haziness around the affected area with enlarged Peri antral gastric lymph nodes. Vascular/Lymphatic: Atherosclerotic calcification. No acute vascular finding . Reproductive:Benign right adnexal calcification. 15 mm right adnexal simple appearing cystic density, stable from lumbar MRI 09/02/2013 Other: No ascites or pneumoperitoneum. Musculoskeletal: Diffuse advanced degenerative disease of the lumbar in thoracic spine. There is bulky facet spurring at T9-10 and T10-11, with presumed cord impingement. Bilateral hip arthroplasty. IMPRESSION: 1. Marked submucosal low-density thickening of the distal stomach with pneumatosis, likely inflammatory and from peptic ulcer disease. No extraluminal perforation. Recommend updated EGD or CT after convalescence. 2. Notably advanced spinal degeneration. Facet spurs at T9-10 and T10-11 likely compress the cord. Electronically Signed   By: Cynthia Cochran M.D.   On: 04/07/2018 09:32       Subjective: Abdominal pain is better.  No vomiting.  Discharge Exam: Vitals:   04/09/18 0538 04/09/18 2029 04/09/18 2109 04/10/18 0507  BP: (!) 150/72  (!) 168/78 136/72  Pulse: (!) 58  (!) 59 63  Resp:    16  Temp: 98.6 F (37 C)  98.5 F (36.9 C) 98.1 F (36.7 C)  TempSrc: Oral  Oral Oral  SpO2: 98% 97% 98% 99%  Weight:      Height:        General: Pt is alert, awake, not in acute distress Cardiovascular: RRR, S1/S2 +, no rubs, no gallops Respiratory: CTA bilaterally, no wheezing, no rhonchi Abdominal: Soft, NT, ND, bowel sounds + Extremities: no edema, no cyanosis    The results of significant diagnostics from this hospitalization (including imaging, microbiology, ancillary and laboratory) are listed below for reference.     Microbiology: No results found for this or any previous visit (from the past 240 hour(s)).   Labs: BNP (last 3 results) No results for input(s): BNP in the last 8760 hours. Basic Metabolic Panel: Recent Labs  Lab  04/07/18 0645 04/08/18 0415  NA 139 139  K 3.3* 3.8  CL 106 108  CO2 23 25  GLUCOSE 124* 112*  BUN 14 8  CREATININE 0.74 0.71  CALCIUM 9.5 8.8*   Liver Function Tests: Recent Labs  Lab 04/07/18 0645 04/08/18 0415  AST 20 17  ALT 15 14  ALKPHOS 31* 27*  BILITOT 1.2 1.0  PROT 7.7 6.5  ALBUMIN 4.3 3.5   Recent Labs  Lab 04/07/18 0645  LIPASE 26   No results for input(s): AMMONIA in the last 168 hours. CBC: Recent Labs  Lab 04/07/18 0645 04/08/18 0415 04/09/18 0452  WBC 11.6* 7.6 4.9  NEUTROABS 9.8*  --   --   HGB 12.5 11.2* 10.3*  HCT 37.9 35.5* 32.5*  MCV 90.7 93.7 94.8  PLT 242 187 180   Cardiac Enzymes: Recent Labs  Lab 04/07/18 2158 04/08/18 0415 04/08/18 0925  TROPONINI <0.03 <0.03 <0.03   BNP: Invalid input(s): POCBNP CBG: No results for input(s): GLUCAP in the last 168 hours. D-Dimer No results for input(s): DDIMER in the last 72 hours. Hgb A1c No results for input(s): HGBA1C in the last  72 hours. Lipid Profile No results for input(s): CHOL, HDL, LDLCALC, TRIG, CHOLHDL, LDLDIRECT in the last 72 hours. Thyroid function studies No results for input(s): TSH, T4TOTAL, T3FREE, THYROIDAB in the last 72 hours.  Invalid input(s): FREET3 Anemia work up No results for input(s): VITAMINB12, FOLATE, FERRITIN, TIBC, IRON, RETICCTPCT in the last 72 hours. Urinalysis    Component Value Date/Time   COLORURINE STRAW (A) 04/07/2018 0638   APPEARANCEUR CLEAR 04/07/2018 0638   LABSPEC >1.046 (H) 04/07/2018 0638   PHURINE 7.0 04/07/2018 0638   GLUCOSEU NEGATIVE 04/07/2018 0638   HGBUR NEGATIVE 04/07/2018 0638   BILIRUBINUR NEGATIVE 04/07/2018 0638   KETONESUR NEGATIVE 04/07/2018 0638   PROTEINUR NEGATIVE 04/07/2018 0638   NITRITE NEGATIVE 04/07/2018 0638   LEUKOCYTESUR NEGATIVE 04/07/2018 0638   Sepsis Labs Invalid input(s): PROCALCITONIN,  WBC,  LACTICIDVEN Microbiology No results found for this or any previous visit (from the past 240  hour(s)).   Time coordinating discharge:  SIGNED:   Erick Blinks, MD  [facility] 04/10/2018, 11:36 AM   If 7PM-7AM, please contact night-coverage [email or web address]

## 2018-04-10 NOTE — Progress Notes (Signed)
Pt discharged to home in stable condition, all discharge instructions reviewed with and given to pt.  Pt transported off unit via wheelchair with transporters x 1 at chairside.  [email or web address] 

## 2018-04-15 ENCOUNTER — Telehealth (INDEPENDENT_AMBULATORY_CARE_PROVIDER_SITE_OTHER): Payer: Self-pay | Admitting: *Deleted

## 2018-04-15 ENCOUNTER — Encounter (INDEPENDENT_AMBULATORY_CARE_PROVIDER_SITE_OTHER): Payer: Self-pay | Admitting: *Deleted

## 2018-04-15 NOTE — Telephone Encounter (Signed)
Patient needs trilyte 

## 2018-04-17 DIAGNOSIS — K259 Gastric ulcer, unspecified as acute or chronic, without hemorrhage or perforation: Secondary | ICD-10-CM | POA: Diagnosis not present

## 2018-04-18 MED ORDER — PEG 3350-KCL-NA BICARB-NACL 420 G PO SOLR: 4000.0000 mL | Freq: Once | ORAL | 0 refills | Status: AC

## 2018-04-19 ENCOUNTER — Encounter (INDEPENDENT_AMBULATORY_CARE_PROVIDER_SITE_OTHER): Payer: Self-pay | Admitting: Internal Medicine

## 2018-04-29 ENCOUNTER — Encounter (INDEPENDENT_AMBULATORY_CARE_PROVIDER_SITE_OTHER): Payer: Self-pay

## 2018-05-09 ENCOUNTER — Ambulatory Visit (INDEPENDENT_AMBULATORY_CARE_PROVIDER_SITE_OTHER): Payer: Medicare Other | Admitting: Internal Medicine

## 2018-05-09 ENCOUNTER — Encounter (INDEPENDENT_AMBULATORY_CARE_PROVIDER_SITE_OTHER): Payer: Self-pay | Admitting: Internal Medicine

## 2018-05-09 VITALS — BP 160/88 | HR 80 | Temp 98.3°F | Resp 18 | Ht 62.0 in | Wt 155.2 lb

## 2018-05-09 DIAGNOSIS — D649 Anemia, unspecified: Secondary | ICD-10-CM | POA: Diagnosis not present

## 2018-05-09 DIAGNOSIS — K279 Peptic ulcer, site unspecified, unspecified as acute or chronic, without hemorrhage or perforation: Secondary | ICD-10-CM

## 2018-05-09 NOTE — Progress Notes (Signed)
Presenting complaint;  Follow-up to recent hospitalization for peptic ulcer disease.  Database sent subjective:  Patient is 68 year old Caucasian female who was hospitalized last month at [facility] for 3 days with acute onset of abdominal pain.  CT revealed gastric wall thickening and pneumatosis in the wall.  It was felt patient had deep penetrating ulcer.  Patient had been on Celebrex.  She was treated with IV PPI and rapidly improved.  Recommended that she should have follow-up EGD in 8 to 10 weeks to make sure that ulcer has healed.  He has been scheduled for high rescreening colonoscopy on 05/22/2018.  It was recommended to delay colonoscopy so that she can have both procedures at the same time.  She is here for scheduled visit.  She feels much better.  At times she has mild epigastric periumbilical pain which she describes to be hunger pains.  She generally feels better with food.  She has noted some burping and at times she feels as if fullness in her chest.  She feels as if food is sitting there.  She denies dysphagia.  When she has the sensation in retrosternal area she may have very mild discomfort in left pectoral region.  Since she has been on a PPI both of these symptoms have decrease in frequency.  She denies nausea vomiting melena or rectal bleeding.  Her appetite is good and her weight has been stable.  She states her bowels have always been irregular.  She is using Colace on as-needed basis.  She has not taken NSAIDs since recent hospitalization. Hemoglobin at the time of discharge was 10.3  Current Medications: Outpatient Encounter Medications as of 05/09/2018  Medication Sig  . acetaminophen (TYLENOL) 325 MG tablet Take 650 mg by mouth every 6 (six) hours as needed.  Marland Kitchen atorvastatin (LIPITOR) 80 MG tablet Take 80 mg by mouth at bedtime.   . diclofenac sodium (VOLTAREN) 1 % GEL Apply 2 g topically 4 (four) times daily.  Mariane Baumgarten Sodium (COLACE PO) Take by mouth as needed.   . ezetimibe  (ZETIA) 10 MG tablet Take 10 mg by mouth at bedtime.   . famotidine (PEPCID) 20 MG tablet Take 1 tablet (20 mg total) by mouth 2 (two) times daily as needed for heartburn or indigestion.  . fenofibrate 160 MG tablet Take 160 mg by mouth daily.  Marland Kitchen LORazepam (ATIVAN) 2 MG tablet Take 2 mg by mouth at bedtime.  . pantoprazole (PROTONIX) 40 MG tablet Take 1 tablet (40 mg total) by mouth 2 (two) times daily before a meal.   No facility-administered encounter medications on file as of 05/09/2018.      Objective: Blood pressure (!) 160/88, pulse 80, temperature 98.3 F (36.8 C), temperature source Oral, resp. rate 18, height '5\' 2"'  (1.575 m), weight 155 lb 3.2 oz (70.4 kg). Patient is  alert and in no acute distress. Conjunctiva is pink. Sclera is nonicteric Oropharyngeal mucosa is normal. No neck masses or thyromegaly noted. Cardiac exam with regular rhythm normal S1 and S2. No murmur or gallop noted. Lungs are clear to auscultation. Abdomen is symmetrical soft and nontender with organomegaly or masses. No LE edema or clubbing noted.  Labs/studies Results:  CBC Latest Ref Rng & Units 04/09/2018 04/08/2018 04/07/2018  WBC 4.0 - 10.5 K/uL 4.9 7.6 11.6(H)  Hemoglobin 12.0 - 15.0 g/dL 10.3(L) 11.2(L) 12.5  Hematocrit 36.0 - 46.0 % 32.5(L) 35.5(L) 37.9  Platelets 150 - 400 K/uL 180 187 242    CMP Latest Ref Rng &  Units 04/08/2018 04/07/2018 10/02/2017  Glucose 70 - 99 mg/dL 112(H) 124(H) -  BUN 8 - 23 mg/dL 8 14 -  Creatinine 0.44 - 1.00 mg/dL 0.71 0.74 0.90  Sodium 135 - 145 mmol/L 139 139 -  Potassium 3.5 - 5.1 mmol/L 3.8 3.3(L) -  Chloride 98 - 111 mmol/L 108 106 -  CO2 22 - 32 mmol/L 25 23 -  Calcium 8.9 - 10.3 mg/dL 8.8(L) 9.5 -  Total Protein 6.5 - 8.1 g/dL 6.5 7.7 -  Total Bilirubin 0.3 - 1.2 mg/dL 1.0 1.2 -  Alkaline Phos 38 - 126 U/L 27(L) 31(L) -  AST 15 - 41 U/L 17 20 -  ALT 0 - 44 U/L 14 15 -    Hepatic Function Latest Ref Rng & Units 04/08/2018 04/07/2018  Total Protein 6.5 -  8.1 g/dL 6.5 7.7  Albumin 3.5 - 5.0 g/dL 3.5 4.3  AST 15 - 41 U/L 17 20  ALT 0 - 44 U/L 14 15  Alk Phosphatase 38 - 126 U/L 27(L) 31(L)  Total Bilirubin 0.3 - 1.2 mg/dL 1.0 1.2      Assessment:  #1.  Peptic ulcer disease.  She was recently hospitalized for acute pain and had pneumatosis and felt to be due to deep peptic ulcer disease.  She responded to IV PPI.  She had a EGD back in March 2018 for dysphagia and she was noted to have gastric scar but H. pylori testing was negative.  Peptic ulcer disease appears to be due to NSAID use which she is not taking anymore.    #2.  Patient is high risk for colorectal carcinoma.  Last colonoscopy was in December 2013.  Family history is positive for CRC in father who was in his 80s and brother was 53 at the time of diagnosis.  She will undergo colonoscopy at the time of EGD.  #3.  Anemia.  He developed mild anemia during recent hospitalization.  He did not experience GI bleed.  Drop may have been due to acute illness.  Plan:  Continue pantoprazole at 40 mg by mouth twice daily. Patient will go to the lab for CBC. Diagnostic EGD and higher screening colonoscopy to be performed and 1 month or so. Office visit on as-needed basis.

## 2018-05-09 NOTE — Patient Instructions (Addendum)
Physician will call with results of blood test when completed.  Esophagogastroduodenoscopy and colonoscopy to be scheduled in  4 weeks.

## 2018-05-10 ENCOUNTER — Other Ambulatory Visit (INDEPENDENT_AMBULATORY_CARE_PROVIDER_SITE_OTHER): Payer: Self-pay | Admitting: *Deleted

## 2018-05-10 ENCOUNTER — Encounter (INDEPENDENT_AMBULATORY_CARE_PROVIDER_SITE_OTHER): Payer: Self-pay | Admitting: *Deleted

## 2018-05-10 DIAGNOSIS — D649 Anemia, unspecified: Secondary | ICD-10-CM | POA: Insufficient documentation

## 2018-05-10 DIAGNOSIS — K279 Peptic ulcer, site unspecified, unspecified as acute or chronic, without hemorrhage or perforation: Secondary | ICD-10-CM

## 2018-05-10 DIAGNOSIS — Z1211 Encounter for screening for malignant neoplasm of colon: Secondary | ICD-10-CM

## 2018-05-10 LAB — CBC
HCT: 38.4 % (ref 35.0–45.0)
Hemoglobin: 12.6 g/dL (ref 11.7–15.5)
MCH: 29.3 pg (ref 27.0–33.0)
MCHC: 32.8 g/dL (ref 32.0–36.0)
MCV: 89.3 fL (ref 80.0–100.0)
MPV: 12.5 fL (ref 7.5–12.5)
Platelets: 211 10*3/uL (ref 140–400)
RBC: 4.3 10*6/uL (ref 3.80–5.10)
RDW: 12.2 % (ref 11.0–15.0)
WBC: 4.4 10*3/uL (ref 3.8–10.8)

## 2018-05-22 ENCOUNTER — Encounter (HOSPITAL_COMMUNITY): Payer: Self-pay

## 2018-05-22 ENCOUNTER — Ambulatory Visit (HOSPITAL_COMMUNITY): Admit: 2018-05-22 | Payer: Medicare Other | Admitting: Internal Medicine

## 2018-05-22 SURGERY — COLONOSCOPY
Anesthesia: Moderate Sedation

## 2018-05-28 DIAGNOSIS — I10 Essential (primary) hypertension: Secondary | ICD-10-CM | POA: Diagnosis not present

## 2018-05-28 DIAGNOSIS — Z6828 Body mass index (BMI) 28.0-28.9, adult: Secondary | ICD-10-CM | POA: Diagnosis not present

## 2018-05-28 DIAGNOSIS — E782 Mixed hyperlipidemia: Secondary | ICD-10-CM | POA: Diagnosis not present

## 2018-05-30 ENCOUNTER — Other Ambulatory Visit (INDEPENDENT_AMBULATORY_CARE_PROVIDER_SITE_OTHER): Payer: Self-pay | Admitting: *Deleted

## 2018-05-30 DIAGNOSIS — K279 Peptic ulcer, site unspecified, unspecified as acute or chronic, without hemorrhage or perforation: Secondary | ICD-10-CM

## 2018-05-31 ENCOUNTER — Encounter (HOSPITAL_COMMUNITY): Admission: RE | Admit: 2018-05-31 | Payer: Medicare Other | Source: Ambulatory Visit

## 2018-06-05 ENCOUNTER — Other Ambulatory Visit: Payer: Self-pay

## 2018-06-05 ENCOUNTER — Encounter (HOSPITAL_COMMUNITY)
Admission: RE | Admit: 2018-06-05 | Discharge: 2018-06-05 | Disposition: A | Discharge status: Home / Self Care | Payer: Medicare Other | Source: Ambulatory Visit | Attending: Internal Medicine | Admitting: Internal Medicine

## 2018-06-05 ENCOUNTER — Other Ambulatory Visit (HOSPITAL_COMMUNITY): Payer: Medicare Other

## 2018-06-05 DIAGNOSIS — K277 Chronic peptic ulcer, site unspecified, without hemorrhage or perforation: Secondary | ICD-10-CM | POA: Diagnosis not present

## 2018-06-05 DIAGNOSIS — M545 Low back pain: Secondary | ICD-10-CM | POA: Diagnosis not present

## 2018-06-05 DIAGNOSIS — E782 Mixed hyperlipidemia: Secondary | ICD-10-CM | POA: Diagnosis not present

## 2018-06-05 DIAGNOSIS — M25552 Pain in left hip: Secondary | ICD-10-CM | POA: Diagnosis not present

## 2018-06-05 DIAGNOSIS — F411 Generalized anxiety disorder: Secondary | ICD-10-CM | POA: Diagnosis not present

## 2018-06-05 DIAGNOSIS — R7301 Impaired fasting glucose: Secondary | ICD-10-CM | POA: Diagnosis not present

## 2018-06-05 DIAGNOSIS — R945 Abnormal results of liver function studies: Secondary | ICD-10-CM | POA: Diagnosis not present

## 2018-06-07 ENCOUNTER — Encounter (HOSPITAL_COMMUNITY): Admission: RE | Payer: Self-pay | Source: Home / Self Care

## 2018-06-07 ENCOUNTER — Ambulatory Visit (HOSPITAL_COMMUNITY): Admission: RE | Admit: 2018-06-07 | Payer: Medicare Other | Source: Home / Self Care | Admitting: Internal Medicine

## 2018-06-07 SURGERY — ESOPHAGOGASTRODUODENOSCOPY (EGD) WITH PROPOFOL
Anesthesia: Monitor Anesthesia Care

## 2018-08-19 DIAGNOSIS — M5416 Radiculopathy, lumbar region: Secondary | ICD-10-CM | POA: Diagnosis not present

## 2018-09-02 ENCOUNTER — Encounter (INDEPENDENT_AMBULATORY_CARE_PROVIDER_SITE_OTHER): Payer: Self-pay | Admitting: *Deleted

## 2018-09-02 ENCOUNTER — Ambulatory Visit (INDEPENDENT_AMBULATORY_CARE_PROVIDER_SITE_OTHER): Payer: Medicare Other | Admitting: Internal Medicine

## 2018-09-02 ENCOUNTER — Other Ambulatory Visit (INDEPENDENT_AMBULATORY_CARE_PROVIDER_SITE_OTHER): Payer: Self-pay | Admitting: Internal Medicine

## 2018-09-02 ENCOUNTER — Other Ambulatory Visit: Payer: Self-pay

## 2018-09-02 ENCOUNTER — Encounter (INDEPENDENT_AMBULATORY_CARE_PROVIDER_SITE_OTHER): Payer: Self-pay | Admitting: Internal Medicine

## 2018-09-02 VITALS — BP 158/74 | HR 69 | Temp 98.5°F | Ht 62.0 in | Wt 153.0 lb

## 2018-09-02 DIAGNOSIS — K279 Peptic ulcer, site unspecified, unspecified as acute or chronic, without hemorrhage or perforation: Secondary | ICD-10-CM

## 2018-09-02 DIAGNOSIS — Z1211 Encounter for screening for malignant neoplasm of colon: Secondary | ICD-10-CM

## 2018-09-02 DIAGNOSIS — Z800 Family history of malignant neoplasm of digestive organs: Secondary | ICD-10-CM

## 2018-09-02 DIAGNOSIS — R1310 Dysphagia, unspecified: Secondary | ICD-10-CM

## 2018-09-02 NOTE — Progress Notes (Signed)
Subjective:    Patient ID: Cynthia Cochran, female    DOB: 07/06/1950, 68 y.o.   MRN: [id number]  HPIHere today for f/u. Last seen b y Dr. Karilyn Cotaehman in February.  She was scheduled for an EGD/Colonoscopy but was cancelled due to COVID-19 virus. Hx of PUD. She was recently hospitalized for acute abdominal pain and had a pneumatosis felt to be due to PUD in January of this year.   She had a EGD back in 2018 for dysphagia and she was noted to have gastric scar but H. Pylori was negative. Family hx of CRC in father who was in his 4360s and a brother was in his 4450s at the time of diagnosis.  Her last colonoscopy was in 2013.  She tells me she earlier this year, when her ulcer flared up. Had been on Celebrex. Off of the Celebrex now. Sometimes when she eats, it feels like foods sit in her chest.  GERD controlled with Protonix. Appetite is okay.  No weight loss.  BMs move okay.     Review of Systems Past Medical History:  Diagnosis Date  . Anxiety   . Arthritis   . Blood transfusion without reported diagnosis 1982   after c section  . Dysphagia 04/25/2016  . High cholesterol 04/25/2016  . Hyperlipidemia   . Mixed stress and urge urinary incontinence 09/09/2015  . Sciatic nerve pain     Past Surgical History:  Procedure Laterality Date  . BIOPSY  05/12/2016   Procedure: BIOPSY;  Surgeon: Malissa HippoNajeeb U Rehman, MD;  Location: [facility];  Service: Endoscopy;;  gastric  . CARPAL TUNNEL RELEASE     right; 1998, left 1998  . CESAREAN SECTION  1982  . Colonscopy    . ESOPHAGEAL DILATION N/A 05/12/2016   Procedure: ESOPHAGEAL DILATION;  Surgeon: Malissa HippoNajeeb U Rehman, MD;  Location: [facility];  Service: Endoscopy;  Laterality: N/A;  . ESOPHAGOGASTRODUODENOSCOPY N/A 05/12/2016   Procedure: ESOPHAGOGASTRODUODENOSCOPY (EGD);  Surgeon: Malissa HippoNajeeb U Rehman, MD;  Location: [facility];  Service: Endoscopy;  Laterality: N/A;  9:30  . TOTAL HIP ARTHROPLASTY     right; March 2013; left March 2011    Allergies   Allergen Reactions  . Nsaids     Irritates stomach  *MOBIC      Current Outpatient Medications on File Prior to Visit  Medication Sig Dispense Refill  . acetaminophen (TYLENOL) 650 MG CR tablet Take 1,300 mg by mouth 2 (two) times daily as needed for pain.    Marland Kitchen. atorvastatin (LIPITOR) 80 MG tablet Take 80 mg by mouth at bedtime.     . diclofenac sodium (VOLTAREN) 1 % GEL Apply 2 g topically 4 (four) times daily. (Patient taking differently: Apply 2 g topically 3 (three) times daily as needed (pain). ) 100 g 0  . docusate sodium (COLACE) 100 MG capsule Take 100 mg by mouth daily as needed for moderate constipation.     Marland Kitchen. ezetimibe (ZETIA) 10 MG tablet Take 10 mg by mouth at bedtime.     . famotidine (PEPCID) 20 MG tablet Take 1 tablet (20 mg total) by mouth 2 (two) times daily as needed for heartburn or indigestion. 60 tablet 1  . LORazepam (ATIVAN) 2 MG tablet Take 2 mg by mouth at bedtime.  2  . OVER THE COUNTER MEDICATION Take 12 drops by mouth at bedtime. CBD Oil    . pantoprazole (PROTONIX) 40 MG tablet Take 1 tablet (40 mg total) by mouth 2 (two)  times daily before a meal. 60 tablet 1   No current facility-administered medications on file prior to visit.         Objective:   Physical Exam Blood pressure (!) 158/74, pulse 69, temperature 98.5 F (36.9 C), height 5\' 2"  (1.575 m), weight 153 lb (69.4 kg). Alert and oriented. Skin warm and dry. Oral mucosa is moist.   . Sclera anicteric, conjunctivae is pink. Thyroid not enlarged. No cervical lymphadenopathy. Lungs clear. Heart regular rate and rhythm.  Abdomen is soft. Bowel sounds are positive. No hepatomegaly. No abdominal masses felt. No tenderness.  No edema to lower extremities.         Assessment & Plan:  PUD, Dysphagia. Will schedule and EGD possible ED with propofol.  Family hx of colon cancer: Screening colonoscopy with propofol.  The risks of bleeding, perforation and infection were reviewed with patient.

## 2018-09-02 NOTE — Patient Instructions (Signed)
The risks of bleeding, perforation and infection were reviewed with patient.  

## 2018-09-11 DIAGNOSIS — M5442 Lumbago with sciatica, left side: Secondary | ICD-10-CM | POA: Diagnosis not present

## 2018-09-11 DIAGNOSIS — Z8711 Personal history of peptic ulcer disease: Secondary | ICD-10-CM | POA: Diagnosis not present

## 2018-09-23 DIAGNOSIS — M4726 Other spondylosis with radiculopathy, lumbar region: Secondary | ICD-10-CM | POA: Diagnosis not present

## 2018-10-01 ENCOUNTER — Encounter (HOSPITAL_COMMUNITY): Payer: Self-pay

## 2018-10-01 ENCOUNTER — Encounter (HOSPITAL_COMMUNITY)
Admission: RE | Admit: 2018-10-01 | Discharge: 2018-10-01 | Disposition: A | Discharge status: Home / Self Care | Payer: Medicare Other | Source: Ambulatory Visit | Attending: Internal Medicine | Admitting: Internal Medicine

## 2018-10-01 ENCOUNTER — Other Ambulatory Visit: Payer: Self-pay

## 2018-10-01 ENCOUNTER — Other Ambulatory Visit (HOSPITAL_COMMUNITY)
Admission: RE | Admit: 2018-10-01 | Discharge: 2018-10-01 | Disposition: A | Discharge status: Home / Self Care | Payer: Medicare Other | Source: Ambulatory Visit | Attending: Internal Medicine | Admitting: Internal Medicine

## 2018-10-01 DIAGNOSIS — Z1159 Encounter for screening for other viral diseases: Secondary | ICD-10-CM | POA: Insufficient documentation

## 2018-10-01 LAB — SARS CORONAVIRUS 2 (TAT 6-24 HRS): SARS Coronavirus 2: NEGATIVE

## 2018-10-04 ENCOUNTER — Encounter (HOSPITAL_COMMUNITY)
Admission: RE | Disposition: A | Discharge status: Home / Self Care | Payer: Self-pay | Source: Home / Self Care | Attending: Internal Medicine

## 2018-10-04 ENCOUNTER — Ambulatory Visit (HOSPITAL_COMMUNITY)
Admission: RE | Admit: 2018-10-04 | Discharge: 2018-10-04 | Disposition: A | Discharge status: Home / Self Care | Payer: Medicare Other | Attending: Internal Medicine | Admitting: Internal Medicine

## 2018-10-04 ENCOUNTER — Ambulatory Visit (HOSPITAL_COMMUNITY): Payer: Medicare Other | Admitting: Anesthesiology

## 2018-10-04 ENCOUNTER — Other Ambulatory Visit: Payer: Self-pay

## 2018-10-04 ENCOUNTER — Encounter (HOSPITAL_COMMUNITY): Payer: Self-pay | Admitting: Anesthesiology

## 2018-10-04 DIAGNOSIS — K449 Diaphragmatic hernia without obstruction or gangrene: Secondary | ICD-10-CM | POA: Diagnosis not present

## 2018-10-04 DIAGNOSIS — K319 Disease of stomach and duodenum, unspecified: Secondary | ICD-10-CM | POA: Insufficient documentation

## 2018-10-04 DIAGNOSIS — Z800 Family history of malignant neoplasm of digestive organs: Secondary | ICD-10-CM | POA: Diagnosis not present

## 2018-10-04 DIAGNOSIS — F419 Anxiety disorder, unspecified: Secondary | ICD-10-CM | POA: Insufficient documentation

## 2018-10-04 DIAGNOSIS — E7800 Pure hypercholesterolemia, unspecified: Secondary | ICD-10-CM | POA: Insufficient documentation

## 2018-10-04 DIAGNOSIS — E785 Hyperlipidemia, unspecified: Secondary | ICD-10-CM | POA: Diagnosis not present

## 2018-10-04 DIAGNOSIS — K3189 Other diseases of stomach and duodenum: Secondary | ICD-10-CM | POA: Diagnosis not present

## 2018-10-04 DIAGNOSIS — K6289 Other specified diseases of anus and rectum: Secondary | ICD-10-CM | POA: Diagnosis not present

## 2018-10-04 DIAGNOSIS — Z1211 Encounter for screening for malignant neoplasm of colon: Secondary | ICD-10-CM

## 2018-10-04 DIAGNOSIS — Z8719 Personal history of other diseases of the digestive system: Secondary | ICD-10-CM | POA: Diagnosis not present

## 2018-10-04 DIAGNOSIS — K644 Residual hemorrhoidal skin tags: Secondary | ICD-10-CM | POA: Diagnosis not present

## 2018-10-04 DIAGNOSIS — R0789 Other chest pain: Secondary | ICD-10-CM | POA: Insufficient documentation

## 2018-10-04 DIAGNOSIS — Z09 Encounter for follow-up examination after completed treatment for conditions other than malignant neoplasm: Secondary | ICD-10-CM | POA: Diagnosis not present

## 2018-10-04 DIAGNOSIS — Z79899 Other long term (current) drug therapy: Secondary | ICD-10-CM | POA: Diagnosis not present

## 2018-10-04 DIAGNOSIS — K5730 Diverticulosis of large intestine without perforation or abscess without bleeding: Secondary | ICD-10-CM | POA: Diagnosis not present

## 2018-10-04 DIAGNOSIS — K279 Peptic ulcer, site unspecified, unspecified as acute or chronic, without hemorrhage or perforation: Secondary | ICD-10-CM | POA: Insufficient documentation

## 2018-10-04 DIAGNOSIS — K228 Other specified diseases of esophagus: Secondary | ICD-10-CM | POA: Diagnosis not present

## 2018-10-04 DIAGNOSIS — K259 Gastric ulcer, unspecified as acute or chronic, without hemorrhage or perforation: Secondary | ICD-10-CM | POA: Diagnosis not present

## 2018-10-04 DIAGNOSIS — K2970 Gastritis, unspecified, without bleeding: Secondary | ICD-10-CM

## 2018-10-04 DIAGNOSIS — R1310 Dysphagia, unspecified: Secondary | ICD-10-CM

## 2018-10-04 HISTORY — PX: BIOPSY: SHX5522

## 2018-10-04 HISTORY — PX: ESOPHAGOGASTRODUODENOSCOPY (EGD) WITH PROPOFOL: SHX5813

## 2018-10-04 HISTORY — PX: COLONOSCOPY WITH PROPOFOL: SHX5780

## 2018-10-04 SURGERY — COLONOSCOPY WITH PROPOFOL
Anesthesia: General

## 2018-10-04 MED ORDER — PROPOFOL 10 MG/ML IV BOLUS
INTRAVENOUS | Status: DC | PRN
  Administered 2018-10-04: 15 mg via INTRAVENOUS

## 2018-10-04 MED ORDER — PROMETHAZINE HCL 25 MG/ML IJ SOLN: 6.2500 mg | INTRAMUSCULAR | Status: DC | PRN

## 2018-10-04 MED ORDER — HYDROCODONE-ACETAMINOPHEN 7.5-325 MG PO TABS: 1.0000 | ORAL_TABLET | Freq: Once | ORAL | Status: DC | PRN

## 2018-10-04 MED ORDER — PROPOFOL 10 MG/ML IV BOLUS
INTRAVENOUS | Status: AC
  Filled 2018-10-04: qty 40

## 2018-10-04 MED ORDER — HYDROMORPHONE HCL 1 MG/ML IJ SOLN: 0.2500 mg | INTRAMUSCULAR | Status: DC | PRN

## 2018-10-04 MED ORDER — CHLORHEXIDINE GLUCONATE CLOTH 2 % EX PADS: 6.0000 | MEDICATED_PAD | Freq: Once | CUTANEOUS | Status: DC

## 2018-10-04 MED ORDER — KETAMINE HCL 10 MG/ML IJ SOLN
INTRAMUSCULAR | Status: DC | PRN
  Administered 2018-10-04: 15 mg via INTRAVENOUS
  Administered 2018-10-04: 10 mg via INTRAVENOUS

## 2018-10-04 MED ORDER — MIDAZOLAM HCL 2 MG/2ML IJ SOLN
INTRAMUSCULAR | Status: AC
  Filled 2018-10-04: qty 2

## 2018-10-04 MED ORDER — PROPOFOL 500 MG/50ML IV EMUL
INTRAVENOUS | Status: DC | PRN
  Administered 2018-10-04: 08:00:00 via INTRAVENOUS
  Administered 2018-10-04: 125 ug/kg/min via INTRAVENOUS

## 2018-10-04 MED ORDER — MIDAZOLAM HCL 5 MG/5ML IJ SOLN
INTRAMUSCULAR | Status: DC | PRN
  Administered 2018-10-04: 2 mg via INTRAVENOUS

## 2018-10-04 MED ORDER — LACTATED RINGERS IV SOLN
INTRAVENOUS | Status: DC
  Administered 2018-10-04: 07:00:00 via INTRAVENOUS

## 2018-10-04 MED ORDER — KETAMINE HCL 50 MG/5ML IJ SOSY
PREFILLED_SYRINGE | INTRAMUSCULAR | Status: AC
  Filled 2018-10-04: qty 5

## 2018-10-04 MED ORDER — MIDAZOLAM HCL 2 MG/2ML IJ SOLN: 0.5000 mg | Freq: Once | INTRAMUSCULAR | Status: DC | PRN

## 2018-10-04 NOTE — Anesthesia Postprocedure Evaluation (Signed)
Anesthesia Post Note  Patient: RASHANDA MAGLOIRE  Procedure(s) Performed: COLONOSCOPY WITH PROPOFOL (N/A ) ESOPHAGOGASTRODUODENOSCOPY (EGD) WITH PROPOFOL (N/A ) BIOPSY  Patient location during evaluation: PACU Anesthesia Type: General Level of consciousness: awake, oriented and patient cooperative Pain management: pain level controlled Vital Signs Assessment: post-procedure vital signs reviewed and stable Respiratory status: spontaneous breathing, respiratory function stable and nonlabored ventilation Cardiovascular status: blood pressure returned to baseline Postop Assessment: no apparent nausea or vomiting Anesthetic complications: no     Last Vitals:  Vitals:   10/04/18 0810 10/04/18 0815  BP: (!) 126/59 137/69  Pulse: (!) 57 (!) 53  Resp: 18 12  Temp: 36.6 C   SpO2: 98% 98%    Last Pain:  Vitals:   10/04/18 0810  TempSrc:   PainSc: 0-No pain                 Avalyn Molino J

## 2018-10-04 NOTE — H&P (Addendum)
Cynthia Cochran is an 68 y.o. female.   Chief Complaint: Patient is here for EGD and colonoscopy. HPI: Patient is 68 year old Caucasian female who was admitted to this facility in January 2020 with epigastric pain and found to have a large deep gastric ulcer.  This diagnosis was based on CT.  She had been taking NSAIDs.  She has been treated with PPI.  H. pylori serology was negative.  Patient states lately she has had postprandial chest pressure which does not last long.  She says she is really not having swallowing difficulty as is mentioned in the consent.  She also does not have heartburn.  She denies nausea vomiting epigastric pain melena or rectal bleeding.  Patient states she has been taking pantoprazole only once a day.  She did not realize she is supposed to be on a twice a day. Patient is also undergoing higher screening colonoscopy.  Her last exam was in December 2013.  Her father was diagnosed with colon carcinoma at 68 and died within few months of metastatic disease.  Her brother was in his 9s at the time of diagnosis and only received chemotherapy because his disease was too far gone. Patient is not taking OTC NSAIDs anymore.  Past Medical History:  Diagnosis Date  . Anxiety   . Arthritis   . Blood transfusion without reported diagnosis 1982   after c section  . Dysphagia 04/25/2016  . High cholesterol 04/25/2016  . Hyperlipidemia   . Mixed stress and urge urinary incontinence 09/09/2015  . Sciatic nerve pain     Past Surgical History:  Procedure Laterality Date  . BIOPSY  05/12/2016   Procedure: BIOPSY;  Surgeon: Rogene Houston, MD;  Location: [facility];  Service: Endoscopy;;  gastric  . CARPAL TUNNEL RELEASE     right; 1998, left 1998  . Media  . Colonscopy    . ESOPHAGEAL DILATION N/A 05/12/2016   Procedure: ESOPHAGEAL DILATION;  Surgeon: Rogene Houston, MD;  Location: [facility];  Service: Endoscopy;  Laterality: N/A;  . ESOPHAGOGASTRODUODENOSCOPY  N/A 05/12/2016   Procedure: ESOPHAGOGASTRODUODENOSCOPY (EGD);  Surgeon: Rogene Houston, MD;  Location: [facility];  Service: Endoscopy;  Laterality: N/A;  9:30  . TOTAL HIP ARTHROPLASTY     right; March 2013; left March 2011    Family History  Problem Relation Age of Onset  . Colon cancer Father 36  . Colon cancer Brother 62  . Other Mother        heavy smoker; died from arterial sclerosis  . Hypertension Sister   . Stomach cancer Neg Hx    Social History:  reports that she has never smoked. She has never used smokeless tobacco. She reports current alcohol use. She reports that she does not use drugs.  Allergies:  Allergies  Allergen Reactions  . Gabapentin Other (See Comments)    Feet went numb  . Mobic [Meloxicam] Other (See Comments)    Irritates stomach  . Nsaids Other (See Comments)    Irritates stomach - MOBIC      Medications Prior to Admission  Medication Sig Dispense Refill  . acetaminophen (TYLENOL) 650 MG CR tablet Take 1,300 mg by mouth 2 (two) times daily as needed for pain.    Marland Kitchen atorvastatin (LIPITOR) 80 MG tablet Take 80 mg by mouth at bedtime.     Marland Kitchen ezetimibe (ZETIA) 10 MG tablet Take 10 mg by mouth at bedtime.     Marland Kitchen LORazepam (ATIVAN)  2 MG tablet Take 2 mg by mouth at bedtime.  2  . pantoprazole (PROTONIX) 40 MG tablet Take 1 tablet (40 mg total) by mouth 2 (two) times daily before a meal. 60 tablet 1  . pregabalin (LYRICA) 50 MG capsule Take 50 mg by mouth 3 (three) times daily.    . traMADol (ULTRAM) 50 MG tablet Take 50 mg by mouth every 6 (six) hours as needed for moderate pain.    Marland Kitchen. diclofenac sodium (VOLTAREN) 1 % GEL Apply 2 g topically 4 (four) times daily. (Patient not taking: Reported on 09/25/2018) 100 g 0  . famotidine (PEPCID) 20 MG tablet Take 1 tablet (20 mg total) by mouth 2 (two) times daily as needed for heartburn or indigestion. (Patient not taking: Reported on 09/25/2018) 60 tablet 1    No results found for this or any previous visit  (from the past 48 hour(s)). No results found.  ROS  Blood pressure (!) 162/72, pulse 60, temperature 98.2 F (36.8 C), temperature source Oral, resp. rate 18, height 5\' 2"  (1.575 m), weight 68 kg, SpO2 95 %. Physical Exam  Constitutional: She appears well-developed and well-nourished.  HENT:  Mouth/Throat: Oropharynx is clear and moist.  Eyes: Conjunctivae are normal.  Neck: No thyromegaly present.  Cardiovascular: Normal rate, regular rhythm and normal heart sounds.  No murmur heard. Respiratory: Effort normal and breath sounds normal.  GI: Soft. She exhibits no distension and no mass. There is no abdominal tenderness.  She has lower midline scar.  Musculoskeletal:        General: No edema.  Lymphadenopathy:    She has no cervical adenopathy.  Neurological: She is alert.  Skin: Skin is warm.     Assessment/Plan History of gastric ulcer. Diagnostic EGD and high risk screening colonoscopy.  Lionel DecemberNajeeb Meriam Chojnowski, MD 10/04/2018, 7:16 AM

## 2018-10-04 NOTE — Op Note (Signed)
[facility] Patient Name: Cynthia BaltimoreKathy Cochran Procedure Date: 10/04/2018 7:45 AM MRN: [id number] Date of Birth: 05/07/1950 Attending MD: Cynthia DecemberNajeeb Jillian Cochran , MD CSN: [id number] Age: [age] Admit Type: Outpatient Procedure:                Colonoscopy Indications:              Screening in patient at increased risk: Colorectal                            cancer in father 8860 or older, Screening in patient                            at increased risk: Colorectal cancer in brother 6868                            or older Providers:                Cynthia DecemberNajeeb Cynthia Beaudin, MD, Cynthia MessingLurae B. Patsy LagerAlbert RN, Cochran, Cynthia Leiteravid                            Cochran Tech., Technician, Cynthia Ruddleonya Cochran Referring MD:             Cynthia PizzaZach Hall, MD Medicines:                Propofol per Anesthesia Complications:            No immediate complications. Estimated Blood Loss:     Estimated blood loss: none. Procedure:                Pre-Anesthesia Assessment:                           - Prior to the procedure, a History and Physical                            was performed, and patient medications and                            allergies were reviewed. The patient's tolerance of                            previous anesthesia was also reviewed. The risks                            and benefits of the procedure and the sedation                            options and risks were discussed with the patient.                            All questions were answered, and informed consent                            was obtained. Prior Anticoagulants: The patient has  taken no previous anticoagulant or antiplatelet                            agents. ASA Grade Assessment: II - A patient with                            mild systemic disease. After reviewing the risks                            and benefits, the patient was deemed in                            satisfactory condition to undergo the procedure.                           - Prior to  the procedure, a History and Physical                            was performed, and patient medications and                            allergies were reviewed. The patient's tolerance of                            previous anesthesia was also reviewed. The risks                            and benefits of the procedure and the sedation                            options and risks were discussed with the patient.                            All questions were answered, and informed consent                            was obtained. ASA Grade Assessment: II - A patient                            with mild systemic disease. After reviewing the                            risks and benefits, the patient was deemed in                            satisfactory condition to undergo the procedure.                           After obtaining informed consent, the colonoscope                            was passed under direct vision. Throughout the  procedure, the patient's blood pressure, pulse, and                            oxygen saturations were monitored continuously. The                            [id number]-[id number] ([id number] scope was introduced through                            the anus and advanced to the the cecum, identified                            by appendiceal orifice and ileocecal valve. The                            colonoscopy was performed without difficulty. The                            patient tolerated the procedure well. The quality                            of the bowel preparation was excellent. The                            ileocecal valve, appendiceal orifice, and rectum                            were photographed. Scope In: 7:48:00 AM Scope Out: 8:02:08 AM Scope Withdrawal Time: 0 hours 6 minutes 33 seconds  Total Procedure Duration: 0 hours 14 minutes 8 seconds  Findings:      The perianal and digital rectal examinations were normal.      Scattered  diverticula were found in the sigmoid colon and hepatic       flexure.      The exam was otherwise normal throughout the examined colon.      External hemorrhoids were found during retroflexion. The hemorrhoids       were small.      Anal papilla(e) were hypertrophied. Impression:               - Diverticulosis in the sigmoid colon and at the                            hepatic flexure.                           - External hemorrhoids.                           - Anal papilla(e) were hypertrophied.                           - No specimens collected. Moderate Sedation:      Per Anesthesia Care Recommendation:           - Patient has a contact number available for  emergencies. The signs and symptoms of potential                            delayed complications were discussed with the                            patient. Return to normal activities tomorrow.                            Written discharge instructions were provided to the                            patient.                           - High fiber diet today.                           - Continue present medications.                           - Repeat colonoscopy in 5 years for screening                            purposes. Procedure Code(s):        --- Professional ---                           [id number], Colonoscopy, flexible; diagnostic, including                            collection of specimen(s) by brushing or washing,                            when performed (separate procedure) Diagnosis Code(s):        --- Professional ---                           K64.4, Residual hemorrhoidal skin tags                           K62.89, Other specified diseases of anus and rectum                           Z80.0, Family history of malignant neoplasm of                            digestive organs                           K57.30, Diverticulosis of large intestine without                            perforation or  abscess without bleeding CPT copyright 2019 American Medical Association. All rights reserved. The codes documented in this report are preliminary and upon coder review may  be revised to meet current compliance requirements.  Cynthia December, MD Cynthia December, MD 10/04/2018 8:18:03 AM This report has been signed electronically. Number of Addenda: 0

## 2018-10-04 NOTE — Transfer of Care (Signed)
Immediate Anesthesia Transfer of Care Note  Patient: AZARIE CORIZ  Procedure(s) Performed: COLONOSCOPY WITH PROPOFOL (N/A ) ESOPHAGOGASTRODUODENOSCOPY (EGD) WITH PROPOFOL (N/A ) BIOPSY  Patient Location: PACU  Anesthesia Type:General  Level of Consciousness: awake and patient cooperative  Airway & Oxygen Therapy: Patient Spontanous Breathing and Patient connected to nasal cannula oxygen  Post-op Assessment: Report given to RN, Post -op Vital signs reviewed and stable and Patient moving all extremities  Post vital signs: Reviewed and stable  Last Vitals:  Vitals Value Taken Time  BP    Temp    Pulse 75 10/04/18 0808  Resp    SpO2 81 % 10/04/18 0808  Vitals shown include unvalidated device data.  Last Pain:  Vitals:   10/04/18 0729  TempSrc:   PainSc: 0-No pain         Complications: No apparent anesthesia complications

## 2018-10-04 NOTE — Discharge Instructions (Signed)
No aspirin or NSAIDs for 24 hours. Resume other medications as before. Resume usual medications. No driving for 24 hours. Physician will call with biopsy results. Next colonoscopy in 5 years.    Colonoscopy, Adult, Care After This sheet gives you information about how to care for yourself after your procedure. Your doctor may also give you more specific instructions. If you have problems or questions, call your doctor. What can I expect after the procedure? After the procedure, it is common to have:  A small amount of blood in your poop for 24 hours.  Some gas.  Mild cramping or bloating in your belly. Follow these instructions at home: General instructions  For the first 24 hours after the procedure: ? Do not drive or use machinery. ? Do not sign important documents. ? Do not drink alcohol. ? Do your daily activities more slowly than normal. ? Eat foods that are soft and easy to digest.  Take over-the-counter or prescription medicines only as told by your doctor. To help cramping and bloating:   Try walking around.  Put heat on your belly (abdomen) as told by your doctor. Use a heat source that your doctor recommends, such as a moist heat pack or a heating pad. ? Put a towel between your skin and the heat source. ? Leave the heat on for 20-30 minutes. ? Remove the heat if your skin turns bright red. This is especially important if you cannot feel pain, heat, or cold. You can get burned. Eating and drinking   Drink enough fluid to keep your pee (urine) clear or pale yellow.  Return to your normal diet as told by your doctor. Avoid heavy or fried foods that are hard to digest.  Avoid drinking alcohol for as long as told by your doctor. Contact a doctor if:  You have blood in your poop (stool) 2-3 days after the procedure. Get help right away if:  You have more than a small amount of blood in your poop.  You see large clumps of tissue (blood clots) in your  poop.  Your belly is swollen.  You feel sick to your stomach (nauseous).  You throw up (vomit).  You have a fever.  You have belly pain that gets worse, and medicine does not help your pain. Summary  After the procedure, it is common to have a small amount of blood in your poop. You may also have mild cramping and bloating in your belly.  For the first 24 hours after the procedure, do not drive or use machinery, do not sign important documents, and do not drink alcohol.  Get help right away if you have a lot of blood in your poop, feel sick to your stomach, have a fever, or have more belly pain. This information is not intended to replace advice given to you by your health care provider. Make sure you discuss any questions you have with your health care provider. Document Released: 04/01/2010 Document Revised: 12/28/2016 Document Reviewed: 11/22/2015 Elsevier Patient Education  2020 [place] After These instructions provide you with information about caring for yourself after your procedure. Your health care provider may also give you more specific instructions. Your treatment has been planned according to current medical practices, but problems sometimes occur. Call your health care provider if you have any problems or questions after your procedure. What can I expect after the procedure? After your procedure, you may:  Feel sleepy for several hours.  Feel  clumsy and have poor balance for several hours.  Feel forgetful about what happened after the procedure.  Have poor judgment for several hours.  Feel nauseous or vomit.  Have a sore throat if you had a breathing tube during the procedure. Follow these instructions at home: For at least 24 hours after the procedure:      Have a responsible adult stay with you. It is important to have someone help care for you until you are awake and alert.  Rest as needed.  Do  not: ? Participate in activities in which you could fall or become injured. ? Drive. ? Use heavy machinery. ? Drink alcohol. ? Take sleeping pills or medicines that cause drowsiness. ? Make important decisions or sign legal documents. ? Take care of children on your own. Eating and drinking  Follow the diet that is recommended by your health care provider.  If you vomit, drink water, juice, or soup when you can drink without vomiting.  Make sure you have little or no nausea before eating solid foods. General instructions  Take over-the-counter and prescription medicines only as told by your health care provider.  If you have sleep apnea, surgery and certain medicines can increase your risk for breathing problems. Follow instructions from your health care provider about wearing your sleep device: ? Anytime you are sleeping, including during daytime naps. ? While taking prescription pain medicines, sleeping medicines, or medicines that make you drowsy.  If you smoke, do not smoke without supervision.  Keep all follow-up visits as told by your health care provider. This is important. Contact a health care provider if:  You keep feeling nauseous or you keep vomiting.  You feel light-headed.  You develop a rash.  You have a fever. Get help right away if:  You have trouble breathing. Summary  For several hours after your procedure, you may feel sleepy and have poor judgment.  Have a responsible adult stay with you for at least 24 hours or until you are awake and alert. This information is not intended to replace advice given to you by your health care provider. Make sure you discuss any questions you have with your health care provider. Document Released: 06/20/2015 Document Revised: 05/28/2017 Document Reviewed: 06/20/2015 Elsevier Patient Education  2020 [employer]

## 2018-10-04 NOTE — Anesthesia Preprocedure Evaluation (Signed)
Anesthesia Evaluation  Patient identified by MRN, date of birth, ID band Patient awake    Reviewed: Allergy & Precautions, NPO status , Patient's Chart, lab work & pertinent test results  Airway Mallampati: III  TM Distance: >3 FB Neck ROM: Full    Dental no notable dental hx. (+) Teeth Intact   Pulmonary neg pulmonary ROS,    Pulmonary exam normal breath sounds clear to auscultation       Cardiovascular Exercise Tolerance: Good negative cardio ROS Normal cardiovascular examI Rhythm:Regular Rate:Normal     Neuro/Psych Anxiety Chronic sciatica issues on meds   Neuromuscular disease negative psych ROS   GI/Hepatic negative GI ROS, Neg liver ROS, Reports h/o Gastric ulcer in past -never had bleeding Denies GERD  Or Sx today  On protonix q day   Endo/Other  negative endocrine ROS  Renal/GU negative Renal ROS  negative genitourinary   Musculoskeletal  (+) Arthritis , Osteoarthritis,    Abdominal   Peds negative pediatric ROS (+)  Hematology negative hematology ROS (+) anemia ,   Anesthesia Other Findings   Reproductive/Obstetrics negative OB ROS                             Anesthesia Physical Anesthesia Plan  ASA: II  Anesthesia Plan: General   Post-op Pain Management:    Induction: Intravenous  PONV Risk Score and Plan: 3 and TIVA, Propofol infusion, Ondansetron and Treatment may vary due to age or medical condition  Airway Management Planned: Nasal Cannula and Simple Face Mask  Additional Equipment:   Intra-op Plan:   Post-operative Plan:   Informed Consent: I have reviewed the patients History and Physical, chart, labs and discussed the procedure including the risks, benefits and alternatives for the proposed anesthesia with the patient or authorized representative who has indicated his/her understanding and acceptance.     Dental advisory given  Plan Discussed with:  CRNA  Anesthesia Plan Comments: (Plan Full PPE use  Plan GA with GETA as needed -d/w pt -WTP with same after Q&A)        Anesthesia Quick Evaluation

## 2018-10-04 NOTE — Op Note (Signed)
[facility] Patient Name: Cynthia Cochran Procedure Date: 10/04/2018 7:02 AM MRN: [id number] Date of Birth: 12/31/1950 Attending MD: Hildred Laser , MD CSN: [id number] Age: 68 Admit Type: Outpatient Procedure:                Upper GI endoscopy Indications:              Follow-up of gastric ulcer Providers:                Hildred Laser, MD, Otis Peak B. Sharon Seller, RN, Raphael Gibney, Technician Referring MD:             Delphina Cahill, MD Medicines:                Propofol per Anesthesia Complications:            No immediate complications. Estimated Blood Loss:     Estimated blood loss was minimal. Procedure:                Pre-Anesthesia Assessment:                           - Prior to the procedure, a History and Physical                            was performed, and patient medications and                            allergies were reviewed. The patient's tolerance of                            previous anesthesia was also reviewed. The risks                            and benefits of the procedure and the sedation                            options and risks were discussed with the patient.                            All questions were answered, and informed consent                            was obtained. Prior Anticoagulants: The patient has                            taken no previous anticoagulant or antiplatelet                            agents. ASA Grade Assessment: II - A patient with                            mild systemic disease. After reviewing the risks  and benefits, the patient was deemed in                            satisfactory condition to undergo the procedure.                           After obtaining informed consent, the endoscope was                            passed under direct vision. Throughout the                            procedure, the patient's blood pressure, pulse, and                            oxygen  saturations were monitored continuously. The                            [id number]-H190 ([id number] scope was introduced through the                            mouth, and advanced to the second part of duodenum.                            The upper GI endoscopy was accomplished without                            difficulty. The patient tolerated the procedure                            well. Scope In: 7:36:24 AM Scope Out: 7:43:57 AM Total Procedure Duration: 0 hours 7 minutes 33 seconds  Findings:      The examined esophagus was normal.      The Z-line was irregular and was found 33 cm from the incisors.      A 2 cm hiatal hernia was present.      The cardia, gastric fundus, gastric body and pylorus were normal.      A healed ulcer was found on the posterior wall of the gastric antrum.      Patchy mild inflammation characterized by congestion (edema), erosions       and erythema was found in the gastric antrum. Biopsies were taken with a       cold forceps for histology.      The duodenal bulb was normal.      Patchy mildly erythematous mucosa was found at angle of duodenum.      The second portion of the duodenum was normal. Impression:               - Normal esophagus.                           - Z-line irregular, 33 cm from the incisors.                           - 2 cm hiatal hernia.                           -  Normal cardia, gastric fundus, gastric body and                            pylorus.                           - Scar in the gastric antrum (posterior wall).                            Healed ulcer.                           - Gastritis. Biopsied.                           - Normal duodenal bulb.                           - Post-bulbar erythema..                           - Normal second portion of the duodenum. Moderate Sedation:      Per Anesthesia Care Recommendation:           - Patient has a contact number available for                            emergencies. The signs and  symptoms of potential                            delayed complications were discussed with the                            patient. Return to normal activities tomorrow.                            Written discharge instructions were provided to the                            patient.                           - Resume previous diet today.                           - Continue present medications.                           - No aspirin, ibuprofen, naproxen, or other                            non-steroidal anti-inflammatory drugs for 1 day.                           - Await pathology results. Procedure Code(s):        --- Professional ---  [id number], Esophagogastroduodenoscopy, flexible,                            transoral; with biopsy, single or multiple Diagnosis Code(s):        --- Professional ---                           K22.8, Other specified diseases of esophagus                           K44.9, Diaphragmatic hernia without obstruction or                            gangrene                           K31.89, Other diseases of stomach and duodenum                           K29.70, Gastritis, unspecified, without bleeding                           K25.9, Gastric ulcer, unspecified as acute or                            chronic, without hemorrhage or perforation CPT copyright 2019 American Medical Association. All rights reserved. The codes documented in this report are preliminary and upon coder review may  be revised to meet current compliance requirements. Lionel DecemberNajeeb Joice Nazario, MD Lionel DecemberNajeeb Danzig Macgregor, MD 10/04/2018 8:14:29 AM This report has been signed electronically. Number of Addenda: 0

## 2018-10-11 ENCOUNTER — Encounter (HOSPITAL_COMMUNITY): Payer: Self-pay | Admitting: Internal Medicine

## 2018-10-17 ENCOUNTER — Other Ambulatory Visit (INDEPENDENT_AMBULATORY_CARE_PROVIDER_SITE_OTHER): Payer: Self-pay | Admitting: *Deleted

## 2018-10-17 ENCOUNTER — Other Ambulatory Visit (INDEPENDENT_AMBULATORY_CARE_PROVIDER_SITE_OTHER): Payer: Self-pay | Admitting: Internal Medicine

## 2018-10-17 MED ORDER — PANTOPRAZOLE SODIUM 40 MG PO TBEC: 40.0000 mg | DELAYED_RELEASE_TABLET | Freq: Two times a day (BID) | ORAL | 1 refills | Status: DC

## 2018-10-28 DIAGNOSIS — M5416 Radiculopathy, lumbar region: Secondary | ICD-10-CM | POA: Diagnosis not present

## 2018-10-30 DIAGNOSIS — F330 Major depressive disorder, recurrent, mild: Secondary | ICD-10-CM | POA: Diagnosis not present

## 2018-10-30 DIAGNOSIS — E785 Hyperlipidemia, unspecified: Secondary | ICD-10-CM | POA: Diagnosis not present

## 2018-10-30 DIAGNOSIS — M545 Low back pain: Secondary | ICD-10-CM | POA: Diagnosis not present

## 2018-10-30 DIAGNOSIS — R438 Other disturbances of smell and taste: Secondary | ICD-10-CM | POA: Diagnosis not present

## 2018-10-30 DIAGNOSIS — R2689 Other abnormalities of gait and mobility: Secondary | ICD-10-CM | POA: Diagnosis not present

## 2018-10-30 DIAGNOSIS — R945 Abnormal results of liver function studies: Secondary | ICD-10-CM | POA: Diagnosis not present

## 2018-10-30 DIAGNOSIS — I10 Essential (primary) hypertension: Secondary | ICD-10-CM | POA: Diagnosis not present

## 2018-10-30 DIAGNOSIS — M5442 Lumbago with sciatica, left side: Secondary | ICD-10-CM | POA: Diagnosis not present

## 2018-10-30 DIAGNOSIS — R413 Other amnesia: Secondary | ICD-10-CM | POA: Diagnosis not present

## 2018-10-30 DIAGNOSIS — F411 Generalized anxiety disorder: Secondary | ICD-10-CM | POA: Diagnosis not present

## 2018-10-30 DIAGNOSIS — Z8711 Personal history of peptic ulcer disease: Secondary | ICD-10-CM | POA: Diagnosis not present

## 2018-10-30 DIAGNOSIS — R42 Dizziness and giddiness: Secondary | ICD-10-CM | POA: Diagnosis not present

## 2018-10-30 DIAGNOSIS — E782 Mixed hyperlipidemia: Secondary | ICD-10-CM | POA: Diagnosis not present

## 2018-10-30 DIAGNOSIS — M25552 Pain in left hip: Secondary | ICD-10-CM | POA: Diagnosis not present

## 2018-10-30 DIAGNOSIS — R7301 Impaired fasting glucose: Secondary | ICD-10-CM | POA: Diagnosis not present

## 2018-10-30 DIAGNOSIS — Z6828 Body mass index (BMI) 28.0-28.9, adult: Secondary | ICD-10-CM | POA: Diagnosis not present

## 2018-10-30 DIAGNOSIS — R202 Paresthesia of skin: Secondary | ICD-10-CM | POA: Diagnosis not present

## 2018-11-04 ENCOUNTER — Telehealth: Payer: Self-pay | Admitting: Neurology

## 2018-11-04 ENCOUNTER — Encounter: Payer: Self-pay | Admitting: Neurology

## 2018-11-04 ENCOUNTER — Other Ambulatory Visit: Payer: Self-pay

## 2018-11-04 ENCOUNTER — Ambulatory Visit (INDEPENDENT_AMBULATORY_CARE_PROVIDER_SITE_OTHER): Payer: Medicare Other | Admitting: Neurology

## 2018-11-04 VITALS — BP 139/78 | HR 68 | Temp 97.1°F | Ht 62.0 in | Wt 151.5 lb

## 2018-11-04 DIAGNOSIS — R202 Paresthesia of skin: Secondary | ICD-10-CM | POA: Diagnosis not present

## 2018-11-04 DIAGNOSIS — R200 Anesthesia of skin: Secondary | ICD-10-CM | POA: Diagnosis not present

## 2018-11-04 DIAGNOSIS — R32 Unspecified urinary incontinence: Secondary | ICD-10-CM

## 2018-11-04 DIAGNOSIS — M5416 Radiculopathy, lumbar region: Secondary | ICD-10-CM | POA: Diagnosis not present

## 2018-11-04 DIAGNOSIS — M4802 Spinal stenosis, cervical region: Secondary | ICD-10-CM | POA: Insufficient documentation

## 2018-11-04 DIAGNOSIS — R269 Unspecified abnormalities of gait and mobility: Secondary | ICD-10-CM | POA: Insufficient documentation

## 2018-11-04 NOTE — Progress Notes (Signed)
[facility]  PATIENT: Cynthia HumbleKathy K Cochran DOB: 06/06/1950  REFERRING DOCTOR OR PCP: Cynthia SellsJohn Hall, Cochran SOURCE: Patient, notes from primary care  _________________________________   HISTORICAL  CHIEF COMPLAINT:  Chief Complaint  Patient presents with  . New Patient (Initial Visit)    RM 12, alone. Paper referral from Dr. Margo Cochran for loss of balance, numbness/tingling in extremities.     HISTORY OF PRESENT ILLNESS:  I had the pleasure of seeing your patient, Cynthia Cochran, [facility] for [facility] neurologic consultation regarding loss of balance, numbness and tingling.  She is a 68 year old woman who has noted issues with stumbling over the last 2 weeks.   She notes that she was swaying some when she was tested at her PCP's office.   She has noted reduced sensation in both feet since last year.  Of note, she had been placed on gabapentin for olfactory symptoms (phantosmia).    However, when the dose was increased, she began to note numbness and tingling in her feet and that has persisted.     More recently she was placed on Lyrica and she notes that many symptoms seemed to worsen since it was started in mid-July.  Last week, she had the onset of left arm numbness and tingling.   This was worse last Wednesday and Thursday but is still bothering her some now.   She also has some pain in the left shoulder.   She has left lumbar radiculopathy and had a lumbar ESI last week by Dr. Ollen Cochran.    She has had some urinary incontinence a couple times while on Lyrica but not other times (still has some urgency).  She also noted that she was not retaining information or processing as well/  This occured while she was on Lyrica.  These symptoms are better but not yet at baseline.   I personally reviewed multiple MRI scans:  MRI of the brain from 12/18/2017 shows some scattered subcortical and deep white matter T2/flair hyperintense foci consistent with mild chronic microvascular  ischemic changes.  There is no atrophy., MRI of the lumbar spine 11/23/2017 and MRI of the cervical spine from 01/12/2009.  MRI of the lumbar spine shows spinal stenosis at T10-T11 due to congenitally short pedicles and a synovial cyst and disc bulge.  There is spinal stenosis and severe lateral recess stenosis at L3-L4 with possible compression of the L4 nerve roots, worse on the left, and encroachment upon the L3 nerve roots.  At L4-L5, there is right greater than left foraminal and lateral recess stenosis and possible right L4 nerve root compression.  MRI of the cervical spine dated 01/12/2009 shows spinal stenosis at C4-C5, C5-C6 and C6-C7.  At C4-C5, there is a right paramedian disc herniation indenting the thecal sac and impinging on both C5 nerve roots    REVIEW OF SYSTEMS: Constitutional: No fevers, chills, sweats, or change in appetite Eyes: No visual changes, double vision, eye pain Ear, nose and throat: No hearing loss, ear pain, nasal congestion, sore throat Cardiovascular: No chest pain, palpitations Respiratory: No shortness of breath at rest or with exertion.   No wheezes GastrointestinaI: No nausea, vomiting, diarrhea, abdominal pain, fecal incontinence Genitourinary: No dysuria, urinary retention or frequency.  No nocturia. Musculoskeletal:As above  integumentary: No rash, pruritus, skin lesions Neurological: as above Psychiatric: No depression at this time.  No anxiety Endocrine: No palpitations, diaphoresis, change in appetite, change in weigh or increased thirst Hematologic/Lymphatic: No anemia, purpura, petechiae. Allergic/Immunologic:  No itchy/runny eyes, nasal congestion, recent allergic reactions, rashes  ALLERGIES: Allergies  Allergen Reactions  . Gabapentin Other (See Comments)    Feet went numb  . Lyrica [Pregabalin]   . Mobic [Meloxicam] Other (See Comments)    Irritates stomach  . Nsaids Other (See Comments)    Irritates stomach - MOBIC      HOME  MEDICATIONS:  Current Outpatient Medications:  .  acetaminophen (TYLENOL) 650 MG CR tablet, Take 1,300 mg by mouth 2 (two) times daily as needed for pain., Disp: , Rfl:  .  atorvastatin (LIPITOR) 80 MG tablet, Take 80 mg by mouth at bedtime. , Disp: , Rfl:  .  docusate sodium (COLACE) 100 MG capsule, Take 100 mg by mouth daily., Disp: , Rfl:  .  ezetimibe (ZETIA) 10 MG tablet, Take 10 mg by mouth at bedtime. , Disp: , Rfl:  .  LORazepam (ATIVAN) 2 MG tablet, Take 2 mg by mouth at bedtime., Disp: , Rfl: 2 .  pantoprazole (PROTONIX) 40 MG tablet, TAKE 1 TABLET(40 MG) BY MOUTH TWICE DAILY BEFORE A MEAL, Disp: 180 tablet, Rfl: 3  PAST MEDICAL HISTORY: Past Medical History:  Diagnosis Date  . Anxiety   . Arthritis   . Blood transfusion without reported diagnosis 1982   after c section  . Dysphagia 04/25/2016  . High cholesterol 04/25/2016  . Hyperlipidemia   . Mixed stress and urge urinary incontinence 09/09/2015  . Sciatic nerve pain     PAST SURGICAL HISTORY: Past Surgical History:  Procedure Laterality Date  . BIOPSY  05/12/2016   Procedure: BIOPSY;  Surgeon: Cynthia HippoNajeeb Cochran Cynthia Cochran;  [facility];  Service: Endoscopy;;  gastric  . BIOPSY  10/04/2018   Procedure: BIOPSY;  Surgeon: Cynthia Hippoehman, Cynthia Cochran, Cochran;  [facility];  Service: Endoscopy;;  gastric   . CARPAL TUNNEL RELEASE     right; 1998, left 1998  . CESAREAN SECTION  1982  . COLONOSCOPY WITH PROPOFOL N/A 10/04/2018   Procedure: COLONOSCOPY WITH PROPOFOL;  Surgeon: Cynthia Hippoehman, Cynthia Cochran, Cochran;  [facility];  Service: Endoscopy;  Laterality: N/A;  7:30  . Colonscopy    . ESOPHAGEAL DILATION N/A 05/12/2016   Procedure: ESOPHAGEAL DILATION;  Surgeon: Cynthia HippoNajeeb Cochran Cynthia Cochran;  [facility];  Service: Endoscopy;  Laterality: N/A;  . ESOPHAGOGASTRODUODENOSCOPY N/A 05/12/2016   Procedure: ESOPHAGOGASTRODUODENOSCOPY (EGD);  Surgeon: Cynthia HippoNajeeb Cochran Cynthia Cochran;  [facility];  Service: Endoscopy;  Laterality:  N/A;  9:30  . ESOPHAGOGASTRODUODENOSCOPY (EGD) WITH PROPOFOL N/A 10/04/2018   Procedure: ESOPHAGOGASTRODUODENOSCOPY (EGD) WITH PROPOFOL;  Surgeon: Cynthia Hippoehman, Cynthia Cochran, Cochran;  [facility];  Service: Endoscopy;  Laterality: N/A;  . TOTAL HIP ARTHROPLASTY     right; March 2013; left March 2011    FAMILY HISTORY: Family History  Problem Relation Age of Onset  . Colon cancer Father 5064  . Colon cancer Brother 50  . Other Mother        heavy smoker; died from arterial sclerosis  . Hypertension Sister   . Stomach cancer Neg Hx     SOCIAL HISTORY:  Social History   Socioeconomic History  . Marital status: Widowed    Spouse name: Not on file  . Number of children: 0  . Years of education: some college  . Highest education level: Not on file  Occupational History  . Occupation: Retired  Engineer, productionocial Needs  . Financial resource strain: Not on file  . Food insecurity  Worry: Not on file    Inability: Not on file  . Transportation needs    Medical: Not on file    Non-medical: Not on file  Tobacco Use  . Smoking status: Never Smoker  . Smokeless tobacco: Never Used  Substance and Sexual Activity  . Alcohol use: Yes    Comment: wine once a week  . Drug use: No  . Sexual activity: Not Currently    Birth control/protection: Post-menopausal  Lifestyle  . Physical activity    Days per week: Not on file    Minutes per session: Not on file  . Stress: Not on file  Relationships  . Social Musicianconnections    Talks on phone: Not on file    Gets together: Not on file    Attends religious service: Not on file    Active member of club or organization: Not on file    Attends meetings of clubs or organizations: Not on file    Relationship status: Not on file  . Intimate partner violence    Fear of current or ex partner: Not on file    Emotionally abused: Not on file    Physically abused: Not on file    Forced sexual activity: Not on file  Other Topics Concern  . Not on file  Social  History Narrative   Lives alone   Caffeine use: no more than 2 cups per day    16 oz soda per day   Right handed      PHYSICAL EXAM  Vitals:   11/04/18 0812  BP: 139/78  Pulse: 68  Temp: (!) 97.1 F (36.2 C)  Weight: 151 lb 8 oz (68.7 kg)  Height: 5\' 2"  (1.575 m)    Body mass index is 27.71 kg/m.   General: The patient is well-developed and well-nourished and in no acute distress  HEENT:  Head is [place]/AT.  Sclera are anicteric.  Funduscopic exam shows normal optic discs and retinal vessels.  Neck: No carotid bruits are noted.  The neck is nontender.  Cardiovascular: The heart has a regular rate and rhythm with a normal S1 and S2. There were no murmurs, gallops or rubs.    Skin: Extremities are without rash or  edema.  Musculoskeletal:  Back is nontender  Neurologic Exam  Mental status: The patient is alert and oriented x 3 at the time of the examination. The patient has apparent normal recent and remote memory, with an apparently normal attention span and concentration ability.   Speech is normal.  Cranial nerves: Extraocular movements are full. Pupils are equal, round, and reactive to light and accomodation.  Visual fields are full.  Facial symmetry is present. There is good facial sensation to soft touch bilaterally.Facial strength is normal.  Trapezius and sternocleidomastoid strength is normal. No dysarthria is noted.  The tongue is midline, and the patient has symmetric elevation of the soft palate. No obvious hearing deficits are noted.  Motor:  Muscle bulk is normal.   Tone is normal. Strength is  5 / 5 in all 4 extremities.   Sensory: Sensory testing is intact to pinprick, soft touch and vibration sensation in all 4 extremities except for mild reduction of vibratory sensation in the toes.  Coordination: Cerebellar testing reveals good finger-nose-finger and heel-to-shin bilaterally.  Gait and station: Station is normal.   Gait is normal.  Tandem gait is wide..  Romberg is negative.   Reflexes: Deep tendon reflexes are symmetric and increased at knees.  Plantar responses are flexor.    DIAGNOSTIC DATA (LABS, IMAGING, TESTING) - I reviewed patient records, labs, notes, testing and imaging myself where available.  Lab Results  Component Value Date   WBC 4.4 05/10/2018   HGB 12.6 05/10/2018   HCT 38.4 05/10/2018   MCV 89.3 05/10/2018   PLT 211 05/10/2018      Component Value Date/Time   NA 139 04/08/2018 0415   K 3.8 04/08/2018 0415   CL 108 04/08/2018 0415   CO2 25 04/08/2018 0415   GLUCOSE 112 (H) 04/08/2018 0415   BUN 8 04/08/2018 0415   CREATININE 0.71 04/08/2018 0415   CALCIUM 8.8 (L) 04/08/2018 0415   PROT 6.5 04/08/2018 0415   ALBUMIN 3.5 04/08/2018 0415   AST 17 04/08/2018 0415   ALT 14 04/08/2018 0415   ALKPHOS 27 (L) 04/08/2018 0415   BILITOT 1.0 04/08/2018 0415   GFRNONAA >60 04/08/2018 0415   GFRAA >60 04/08/2018 0415       ASSESSMENT AND PLAN  1. Gait disturbance   2. Urinary incontinence, unspecified type   3. Numbness and tingling   4. Lumbar radiculopathy   5. Cervical stenosis of spinal canal     In summary, Ms. Hara is a 68 year old woman with gait/balance disturbance and increased numbness over the past couple of weeks.  On examination, tandem gait was wide and reflexes were increased at the knees.  Additionally, she had some urinary incontinence recently.  I am most concerned about the possibility of a significant cervical spine stenosis.  MRI 10 years ago showed spinal stenosis at 3 levels with a small disc herniation at C4-C5.  The degenerative changes might have worsened over the last decade.  We will check an MRI of the cervical spine.  She would prefer this to be on the wide bore magnet.  Based on the results consider referral to neurosurgery or physical therapy.  She will return to see me based on the results of the MRI.  She should call sooner if any new or worsening neurologic symptoms.  Thank  you for asking me to see Ms. Zettlemoyer.  Please let me know if I can be of further assistance with her or other patients in the future.  Richard A. Felecia Shelling, Cochran, [facility] 7/82/4235, 3:61 PM Certified in Neurology, Clinical Neurophysiology, Sleep Medicine and Neuroimaging  [facility] [address] [place], [place] [address] [phone]

## 2018-11-04 NOTE — Telephone Encounter (Signed)
Medicare/aarp order sent to GI. No auth they will reach out to the patient to schedule.  

## 2018-11-06 ENCOUNTER — Telehealth: Payer: Self-pay | Admitting: Neurology

## 2018-11-06 NOTE — Telephone Encounter (Signed)
I spoke to the patient and informed her I can send the order to [facility] for the open MRI. She asked for their number and I gave it to her of [phone] because she wanted to see what they had available.  She stated she would get back to me.

## 2018-11-06 NOTE — Telephone Encounter (Signed)
Pt called stating that she is wanting to have an open MRI and [facility] is booked till late Sept. She would like to know if she can be referred to another location. Please advise.

## 2018-11-06 NOTE — Telephone Encounter (Signed)
I spoke to patient she would like to go to [facility]. I faxed the order they will reach out to the patient to schedule.

## 2018-11-06 NOTE — Telephone Encounter (Signed)
Pt is returning the call to [facility], please call

## 2018-11-06 NOTE — Telephone Encounter (Signed)
Patient is scheduled at [facility] for 11/07/18.

## 2018-11-06 NOTE — Telephone Encounter (Signed)
Patient is scheduled at [facility] for 11/07/18.  °

## 2018-11-07 DIAGNOSIS — M47812 Spondylosis without myelopathy or radiculopathy, cervical region: Secondary | ICD-10-CM | POA: Diagnosis not present

## 2018-11-07 DIAGNOSIS — M50321 Other cervical disc degeneration at C4-C5 level: Secondary | ICD-10-CM | POA: Diagnosis not present

## 2018-11-11 ENCOUNTER — Telehealth: Payer: Self-pay | Admitting: Neurology

## 2018-11-11 DIAGNOSIS — M4802 Spinal stenosis, cervical region: Secondary | ICD-10-CM

## 2018-11-11 NOTE — Telephone Encounter (Signed)
Pt has called asking that RN Terrence [name] calls her re: the results of her MRI

## 2018-11-11 NOTE — Addendum Note (Signed)
Addended by: Hope Pigeon on: 11/11/2018 04:25 PM   Modules accepted: Orders

## 2018-11-11 NOTE — Telephone Encounter (Signed)
I called to let her know MRI results.    Went to Vm and I left message  She has spinal stenosis at C4C5, C5C6 and C6C7 and potential for nerve root compression at these levels.      These levels are all more progressed since 2010 (last cervical spine MRI) and the changes may be contribution to some of her symptoms  I would like her to see Neurosurgery

## 2018-11-11 NOTE — Telephone Encounter (Signed)
I called pt back. Relayed results per Dr. Felecia Shelling note. She already follows with Ashok Pall, MD at [facility]. She would like to be referred back to him. I placed referral. She will call to schedule appt if she does not hear within the next 1-2 weeks to schedule.

## 2018-11-19 DIAGNOSIS — I10 Essential (primary) hypertension: Secondary | ICD-10-CM | POA: Diagnosis not present

## 2018-11-19 DIAGNOSIS — M4722 Other spondylosis with radiculopathy, cervical region: Secondary | ICD-10-CM | POA: Diagnosis not present

## 2018-11-19 DIAGNOSIS — Z6828 Body mass index (BMI) 28.0-28.9, adult: Secondary | ICD-10-CM | POA: Diagnosis not present

## 2018-11-22 DIAGNOSIS — M545 Low back pain: Secondary | ICD-10-CM | POA: Diagnosis not present

## 2018-11-22 DIAGNOSIS — M6281 Muscle weakness (generalized): Secondary | ICD-10-CM | POA: Diagnosis not present

## 2018-11-22 DIAGNOSIS — M5412 Radiculopathy, cervical region: Secondary | ICD-10-CM | POA: Diagnosis not present

## 2018-11-22 DIAGNOSIS — M79622 Pain in left upper arm: Secondary | ICD-10-CM | POA: Diagnosis not present

## 2018-11-22 DIAGNOSIS — R2681 Unsteadiness on feet: Secondary | ICD-10-CM | POA: Diagnosis not present

## 2018-11-22 DIAGNOSIS — M79652 Pain in left thigh: Secondary | ICD-10-CM | POA: Diagnosis not present

## 2018-11-22 DIAGNOSIS — M2560 Stiffness of unspecified joint, not elsewhere classified: Secondary | ICD-10-CM | POA: Diagnosis not present

## 2018-11-22 DIAGNOSIS — Z96643 Presence of artificial hip joint, bilateral: Secondary | ICD-10-CM | POA: Diagnosis not present

## 2018-11-25 DIAGNOSIS — M2560 Stiffness of unspecified joint, not elsewhere classified: Secondary | ICD-10-CM | POA: Diagnosis not present

## 2018-11-25 DIAGNOSIS — R2681 Unsteadiness on feet: Secondary | ICD-10-CM | POA: Diagnosis not present

## 2018-11-25 DIAGNOSIS — M6281 Muscle weakness (generalized): Secondary | ICD-10-CM | POA: Diagnosis not present

## 2018-11-25 DIAGNOSIS — M545 Low back pain: Secondary | ICD-10-CM | POA: Diagnosis not present

## 2018-11-25 DIAGNOSIS — M79622 Pain in left upper arm: Secondary | ICD-10-CM | POA: Diagnosis not present

## 2018-11-25 DIAGNOSIS — M5412 Radiculopathy, cervical region: Secondary | ICD-10-CM | POA: Diagnosis not present

## 2018-11-25 DIAGNOSIS — M79652 Pain in left thigh: Secondary | ICD-10-CM | POA: Diagnosis not present

## 2018-11-25 DIAGNOSIS — Z96643 Presence of artificial hip joint, bilateral: Secondary | ICD-10-CM | POA: Diagnosis not present

## 2018-11-26 ENCOUNTER — Other Ambulatory Visit (HOSPITAL_COMMUNITY): Payer: Self-pay | Admitting: Internal Medicine

## 2018-11-26 DIAGNOSIS — Z1231 Encounter for screening mammogram for malignant neoplasm of breast: Secondary | ICD-10-CM

## 2018-11-27 DIAGNOSIS — M4726 Other spondylosis with radiculopathy, lumbar region: Secondary | ICD-10-CM | POA: Diagnosis not present

## 2018-11-27 DIAGNOSIS — M4722 Other spondylosis with radiculopathy, cervical region: Secondary | ICD-10-CM | POA: Diagnosis not present

## 2018-11-28 DIAGNOSIS — M79652 Pain in left thigh: Secondary | ICD-10-CM | POA: Diagnosis not present

## 2018-11-28 DIAGNOSIS — M2560 Stiffness of unspecified joint, not elsewhere classified: Secondary | ICD-10-CM | POA: Diagnosis not present

## 2018-11-28 DIAGNOSIS — M545 Low back pain: Secondary | ICD-10-CM | POA: Diagnosis not present

## 2018-11-28 DIAGNOSIS — Z96643 Presence of artificial hip joint, bilateral: Secondary | ICD-10-CM | POA: Diagnosis not present

## 2018-11-28 DIAGNOSIS — M6281 Muscle weakness (generalized): Secondary | ICD-10-CM | POA: Diagnosis not present

## 2018-11-28 DIAGNOSIS — R2681 Unsteadiness on feet: Secondary | ICD-10-CM | POA: Diagnosis not present

## 2018-11-28 DIAGNOSIS — M79622 Pain in left upper arm: Secondary | ICD-10-CM | POA: Diagnosis not present

## 2018-11-28 DIAGNOSIS — M5412 Radiculopathy, cervical region: Secondary | ICD-10-CM | POA: Diagnosis not present

## 2018-11-29 DIAGNOSIS — M79652 Pain in left thigh: Secondary | ICD-10-CM | POA: Diagnosis not present

## 2018-11-29 DIAGNOSIS — M2560 Stiffness of unspecified joint, not elsewhere classified: Secondary | ICD-10-CM | POA: Diagnosis not present

## 2018-11-29 DIAGNOSIS — Z96643 Presence of artificial hip joint, bilateral: Secondary | ICD-10-CM | POA: Diagnosis not present

## 2018-11-29 DIAGNOSIS — M545 Low back pain: Secondary | ICD-10-CM | POA: Diagnosis not present

## 2018-11-29 DIAGNOSIS — M79622 Pain in left upper arm: Secondary | ICD-10-CM | POA: Diagnosis not present

## 2018-11-29 DIAGNOSIS — M5412 Radiculopathy, cervical region: Secondary | ICD-10-CM | POA: Diagnosis not present

## 2018-11-29 DIAGNOSIS — R2681 Unsteadiness on feet: Secondary | ICD-10-CM | POA: Diagnosis not present

## 2018-11-29 DIAGNOSIS — M6281 Muscle weakness (generalized): Secondary | ICD-10-CM | POA: Diagnosis not present

## 2018-11-30 ENCOUNTER — Other Ambulatory Visit: Payer: Medicare Other

## 2018-12-02 DIAGNOSIS — M2560 Stiffness of unspecified joint, not elsewhere classified: Secondary | ICD-10-CM | POA: Diagnosis not present

## 2018-12-02 DIAGNOSIS — M6281 Muscle weakness (generalized): Secondary | ICD-10-CM | POA: Diagnosis not present

## 2018-12-02 DIAGNOSIS — R2681 Unsteadiness on feet: Secondary | ICD-10-CM | POA: Diagnosis not present

## 2018-12-02 DIAGNOSIS — M5412 Radiculopathy, cervical region: Secondary | ICD-10-CM | POA: Diagnosis not present

## 2018-12-02 DIAGNOSIS — M79652 Pain in left thigh: Secondary | ICD-10-CM | POA: Diagnosis not present

## 2018-12-02 DIAGNOSIS — M545 Low back pain: Secondary | ICD-10-CM | POA: Diagnosis not present

## 2018-12-02 DIAGNOSIS — Z96643 Presence of artificial hip joint, bilateral: Secondary | ICD-10-CM | POA: Diagnosis not present

## 2018-12-02 DIAGNOSIS — M79622 Pain in left upper arm: Secondary | ICD-10-CM | POA: Diagnosis not present

## 2018-12-04 DIAGNOSIS — M79622 Pain in left upper arm: Secondary | ICD-10-CM | POA: Diagnosis not present

## 2018-12-04 DIAGNOSIS — R2681 Unsteadiness on feet: Secondary | ICD-10-CM | POA: Diagnosis not present

## 2018-12-04 DIAGNOSIS — M2560 Stiffness of unspecified joint, not elsewhere classified: Secondary | ICD-10-CM | POA: Diagnosis not present

## 2018-12-04 DIAGNOSIS — M545 Low back pain: Secondary | ICD-10-CM | POA: Diagnosis not present

## 2018-12-04 DIAGNOSIS — M6281 Muscle weakness (generalized): Secondary | ICD-10-CM | POA: Diagnosis not present

## 2018-12-04 DIAGNOSIS — M5412 Radiculopathy, cervical region: Secondary | ICD-10-CM | POA: Diagnosis not present

## 2018-12-04 DIAGNOSIS — Z96643 Presence of artificial hip joint, bilateral: Secondary | ICD-10-CM | POA: Diagnosis not present

## 2018-12-04 DIAGNOSIS — M79652 Pain in left thigh: Secondary | ICD-10-CM | POA: Diagnosis not present

## 2018-12-06 DIAGNOSIS — M79622 Pain in left upper arm: Secondary | ICD-10-CM | POA: Diagnosis not present

## 2018-12-06 DIAGNOSIS — Z96643 Presence of artificial hip joint, bilateral: Secondary | ICD-10-CM | POA: Diagnosis not present

## 2018-12-06 DIAGNOSIS — E782 Mixed hyperlipidemia: Secondary | ICD-10-CM | POA: Diagnosis not present

## 2018-12-06 DIAGNOSIS — R2681 Unsteadiness on feet: Secondary | ICD-10-CM | POA: Diagnosis not present

## 2018-12-06 DIAGNOSIS — R7301 Impaired fasting glucose: Secondary | ICD-10-CM | POA: Diagnosis not present

## 2018-12-06 DIAGNOSIS — M5412 Radiculopathy, cervical region: Secondary | ICD-10-CM | POA: Diagnosis not present

## 2018-12-06 DIAGNOSIS — M79652 Pain in left thigh: Secondary | ICD-10-CM | POA: Diagnosis not present

## 2018-12-06 DIAGNOSIS — M2560 Stiffness of unspecified joint, not elsewhere classified: Secondary | ICD-10-CM | POA: Diagnosis not present

## 2018-12-06 DIAGNOSIS — M6281 Muscle weakness (generalized): Secondary | ICD-10-CM | POA: Diagnosis not present

## 2018-12-06 DIAGNOSIS — E785 Hyperlipidemia, unspecified: Secondary | ICD-10-CM | POA: Diagnosis not present

## 2018-12-06 DIAGNOSIS — M545 Low back pain: Secondary | ICD-10-CM | POA: Diagnosis not present

## 2018-12-06 DIAGNOSIS — I10 Essential (primary) hypertension: Secondary | ICD-10-CM | POA: Diagnosis not present

## 2018-12-09 ENCOUNTER — Ambulatory Visit (HOSPITAL_COMMUNITY): Payer: Medicare Other

## 2018-12-10 DIAGNOSIS — M2560 Stiffness of unspecified joint, not elsewhere classified: Secondary | ICD-10-CM | POA: Diagnosis not present

## 2018-12-10 DIAGNOSIS — M5412 Radiculopathy, cervical region: Secondary | ICD-10-CM | POA: Diagnosis not present

## 2018-12-10 DIAGNOSIS — Z96643 Presence of artificial hip joint, bilateral: Secondary | ICD-10-CM | POA: Diagnosis not present

## 2018-12-10 DIAGNOSIS — M79652 Pain in left thigh: Secondary | ICD-10-CM | POA: Diagnosis not present

## 2018-12-10 DIAGNOSIS — M545 Low back pain: Secondary | ICD-10-CM | POA: Diagnosis not present

## 2018-12-10 DIAGNOSIS — M6281 Muscle weakness (generalized): Secondary | ICD-10-CM | POA: Diagnosis not present

## 2018-12-10 DIAGNOSIS — M79622 Pain in left upper arm: Secondary | ICD-10-CM | POA: Diagnosis not present

## 2018-12-10 DIAGNOSIS — R2681 Unsteadiness on feet: Secondary | ICD-10-CM | POA: Diagnosis not present

## 2018-12-11 DIAGNOSIS — M4802 Spinal stenosis, cervical region: Secondary | ICD-10-CM | POA: Diagnosis not present

## 2018-12-11 DIAGNOSIS — M25552 Pain in left hip: Secondary | ICD-10-CM | POA: Diagnosis not present

## 2018-12-11 DIAGNOSIS — Z23 Encounter for immunization: Secondary | ICD-10-CM | POA: Diagnosis not present

## 2018-12-11 DIAGNOSIS — M545 Low back pain: Secondary | ICD-10-CM | POA: Diagnosis not present

## 2018-12-11 DIAGNOSIS — K277 Chronic peptic ulcer, site unspecified, without hemorrhage or perforation: Secondary | ICD-10-CM | POA: Diagnosis not present

## 2018-12-11 DIAGNOSIS — E782 Mixed hyperlipidemia: Secondary | ICD-10-CM | POA: Diagnosis not present

## 2018-12-11 DIAGNOSIS — Z0001 Encounter for general adult medical examination with abnormal findings: Secondary | ICD-10-CM | POA: Diagnosis not present

## 2018-12-11 DIAGNOSIS — F411 Generalized anxiety disorder: Secondary | ICD-10-CM | POA: Diagnosis not present

## 2018-12-12 ENCOUNTER — Ambulatory Visit (HOSPITAL_COMMUNITY)
Admission: RE | Admit: 2018-12-12 | Discharge: 2018-12-12 | Disposition: A | Discharge status: Home / Self Care | Payer: Medicare Other | Source: Ambulatory Visit | Attending: Internal Medicine | Admitting: Internal Medicine

## 2018-12-12 ENCOUNTER — Other Ambulatory Visit: Payer: Self-pay

## 2018-12-12 DIAGNOSIS — M5412 Radiculopathy, cervical region: Secondary | ICD-10-CM | POA: Diagnosis not present

## 2018-12-12 DIAGNOSIS — Z1231 Encounter for screening mammogram for malignant neoplasm of breast: Secondary | ICD-10-CM | POA: Diagnosis not present

## 2018-12-12 DIAGNOSIS — Z96643 Presence of artificial hip joint, bilateral: Secondary | ICD-10-CM | POA: Diagnosis not present

## 2018-12-12 DIAGNOSIS — M79652 Pain in left thigh: Secondary | ICD-10-CM | POA: Diagnosis not present

## 2018-12-12 DIAGNOSIS — M545 Low back pain: Secondary | ICD-10-CM | POA: Diagnosis not present

## 2018-12-12 DIAGNOSIS — R2681 Unsteadiness on feet: Secondary | ICD-10-CM | POA: Diagnosis not present

## 2018-12-12 DIAGNOSIS — M6281 Muscle weakness (generalized): Secondary | ICD-10-CM | POA: Diagnosis not present

## 2018-12-12 DIAGNOSIS — M79622 Pain in left upper arm: Secondary | ICD-10-CM | POA: Diagnosis not present

## 2018-12-12 DIAGNOSIS — M2560 Stiffness of unspecified joint, not elsewhere classified: Secondary | ICD-10-CM | POA: Diagnosis not present

## 2018-12-13 DIAGNOSIS — M545 Low back pain: Secondary | ICD-10-CM | POA: Diagnosis not present

## 2018-12-13 DIAGNOSIS — M79652 Pain in left thigh: Secondary | ICD-10-CM | POA: Diagnosis not present

## 2018-12-13 DIAGNOSIS — M2560 Stiffness of unspecified joint, not elsewhere classified: Secondary | ICD-10-CM | POA: Diagnosis not present

## 2018-12-13 DIAGNOSIS — M79622 Pain in left upper arm: Secondary | ICD-10-CM | POA: Diagnosis not present

## 2018-12-13 DIAGNOSIS — M5412 Radiculopathy, cervical region: Secondary | ICD-10-CM | POA: Diagnosis not present

## 2018-12-13 DIAGNOSIS — R2681 Unsteadiness on feet: Secondary | ICD-10-CM | POA: Diagnosis not present

## 2018-12-13 DIAGNOSIS — Z96643 Presence of artificial hip joint, bilateral: Secondary | ICD-10-CM | POA: Diagnosis not present

## 2018-12-13 DIAGNOSIS — M6281 Muscle weakness (generalized): Secondary | ICD-10-CM | POA: Diagnosis not present

## 2018-12-16 DIAGNOSIS — M5412 Radiculopathy, cervical region: Secondary | ICD-10-CM | POA: Diagnosis not present

## 2018-12-16 DIAGNOSIS — M545 Low back pain: Secondary | ICD-10-CM | POA: Diagnosis not present

## 2018-12-16 DIAGNOSIS — R2681 Unsteadiness on feet: Secondary | ICD-10-CM | POA: Diagnosis not present

## 2018-12-16 DIAGNOSIS — M79622 Pain in left upper arm: Secondary | ICD-10-CM | POA: Diagnosis not present

## 2018-12-16 DIAGNOSIS — M2560 Stiffness of unspecified joint, not elsewhere classified: Secondary | ICD-10-CM | POA: Diagnosis not present

## 2018-12-16 DIAGNOSIS — M6281 Muscle weakness (generalized): Secondary | ICD-10-CM | POA: Diagnosis not present

## 2018-12-16 DIAGNOSIS — Z96643 Presence of artificial hip joint, bilateral: Secondary | ICD-10-CM | POA: Diagnosis not present

## 2018-12-16 DIAGNOSIS — M79652 Pain in left thigh: Secondary | ICD-10-CM | POA: Diagnosis not present

## 2018-12-17 ENCOUNTER — Other Ambulatory Visit (HOSPITAL_COMMUNITY): Payer: Self-pay | Admitting: Internal Medicine

## 2018-12-17 ENCOUNTER — Other Ambulatory Visit: Payer: Self-pay | Admitting: Internal Medicine

## 2018-12-17 DIAGNOSIS — R928 Other abnormal and inconclusive findings on diagnostic imaging of breast: Secondary | ICD-10-CM

## 2018-12-18 DIAGNOSIS — M79652 Pain in left thigh: Secondary | ICD-10-CM | POA: Diagnosis not present

## 2018-12-18 DIAGNOSIS — M2560 Stiffness of unspecified joint, not elsewhere classified: Secondary | ICD-10-CM | POA: Diagnosis not present

## 2018-12-18 DIAGNOSIS — M79622 Pain in left upper arm: Secondary | ICD-10-CM | POA: Diagnosis not present

## 2018-12-18 DIAGNOSIS — M6281 Muscle weakness (generalized): Secondary | ICD-10-CM | POA: Diagnosis not present

## 2018-12-18 DIAGNOSIS — M5412 Radiculopathy, cervical region: Secondary | ICD-10-CM | POA: Diagnosis not present

## 2018-12-18 DIAGNOSIS — R2681 Unsteadiness on feet: Secondary | ICD-10-CM | POA: Diagnosis not present

## 2018-12-18 DIAGNOSIS — M545 Low back pain: Secondary | ICD-10-CM | POA: Diagnosis not present

## 2018-12-18 DIAGNOSIS — Z96643 Presence of artificial hip joint, bilateral: Secondary | ICD-10-CM | POA: Diagnosis not present

## 2018-12-19 ENCOUNTER — Other Ambulatory Visit: Payer: Self-pay

## 2018-12-19 ENCOUNTER — Other Ambulatory Visit: Payer: Self-pay | Admitting: Internal Medicine

## 2018-12-19 ENCOUNTER — Ambulatory Visit
Admission: RE | Admit: 2018-12-19 | Discharge: 2018-12-19 | Disposition: A | Discharge status: Home / Self Care | Payer: Medicare Other | Source: Ambulatory Visit | Attending: Internal Medicine | Admitting: Internal Medicine

## 2018-12-19 DIAGNOSIS — N6489 Other specified disorders of breast: Secondary | ICD-10-CM | POA: Diagnosis not present

## 2018-12-19 DIAGNOSIS — R928 Other abnormal and inconclusive findings on diagnostic imaging of breast: Secondary | ICD-10-CM

## 2018-12-19 DIAGNOSIS — R922 Inconclusive mammogram: Secondary | ICD-10-CM | POA: Diagnosis not present

## 2018-12-23 ENCOUNTER — Other Ambulatory Visit: Payer: Self-pay | Admitting: Internal Medicine

## 2018-12-23 ENCOUNTER — Other Ambulatory Visit: Payer: Self-pay

## 2018-12-23 ENCOUNTER — Ambulatory Visit
Admission: RE | Admit: 2018-12-23 | Discharge: 2018-12-23 | Disposition: A | Discharge status: Home / Self Care | Payer: Medicare Other | Source: Ambulatory Visit | Attending: Internal Medicine | Admitting: Internal Medicine

## 2018-12-23 DIAGNOSIS — M5412 Radiculopathy, cervical region: Secondary | ICD-10-CM | POA: Diagnosis not present

## 2018-12-23 DIAGNOSIS — Z96643 Presence of artificial hip joint, bilateral: Secondary | ICD-10-CM | POA: Diagnosis not present

## 2018-12-23 DIAGNOSIS — R928 Other abnormal and inconclusive findings on diagnostic imaging of breast: Secondary | ICD-10-CM | POA: Diagnosis not present

## 2018-12-23 DIAGNOSIS — R2681 Unsteadiness on feet: Secondary | ICD-10-CM | POA: Diagnosis not present

## 2018-12-23 DIAGNOSIS — M545 Low back pain: Secondary | ICD-10-CM | POA: Diagnosis not present

## 2018-12-23 DIAGNOSIS — M79652 Pain in left thigh: Secondary | ICD-10-CM | POA: Diagnosis not present

## 2018-12-23 DIAGNOSIS — M6281 Muscle weakness (generalized): Secondary | ICD-10-CM | POA: Diagnosis not present

## 2018-12-23 DIAGNOSIS — M2560 Stiffness of unspecified joint, not elsewhere classified: Secondary | ICD-10-CM | POA: Diagnosis not present

## 2018-12-23 DIAGNOSIS — M79622 Pain in left upper arm: Secondary | ICD-10-CM | POA: Diagnosis not present

## 2018-12-25 DIAGNOSIS — M6281 Muscle weakness (generalized): Secondary | ICD-10-CM | POA: Diagnosis not present

## 2018-12-25 DIAGNOSIS — R2681 Unsteadiness on feet: Secondary | ICD-10-CM | POA: Diagnosis not present

## 2018-12-25 DIAGNOSIS — Z96643 Presence of artificial hip joint, bilateral: Secondary | ICD-10-CM | POA: Diagnosis not present

## 2018-12-25 DIAGNOSIS — M5412 Radiculopathy, cervical region: Secondary | ICD-10-CM | POA: Diagnosis not present

## 2018-12-25 DIAGNOSIS — M2560 Stiffness of unspecified joint, not elsewhere classified: Secondary | ICD-10-CM | POA: Diagnosis not present

## 2018-12-25 DIAGNOSIS — M79652 Pain in left thigh: Secondary | ICD-10-CM | POA: Diagnosis not present

## 2018-12-25 DIAGNOSIS — M79622 Pain in left upper arm: Secondary | ICD-10-CM | POA: Diagnosis not present

## 2018-12-25 DIAGNOSIS — M545 Low back pain: Secondary | ICD-10-CM | POA: Diagnosis not present

## 2018-12-27 DIAGNOSIS — M79652 Pain in left thigh: Secondary | ICD-10-CM | POA: Diagnosis not present

## 2018-12-27 DIAGNOSIS — M6281 Muscle weakness (generalized): Secondary | ICD-10-CM | POA: Diagnosis not present

## 2018-12-27 DIAGNOSIS — M79622 Pain in left upper arm: Secondary | ICD-10-CM | POA: Diagnosis not present

## 2018-12-27 DIAGNOSIS — M5412 Radiculopathy, cervical region: Secondary | ICD-10-CM | POA: Diagnosis not present

## 2018-12-27 DIAGNOSIS — Z96643 Presence of artificial hip joint, bilateral: Secondary | ICD-10-CM | POA: Diagnosis not present

## 2018-12-27 DIAGNOSIS — R2681 Unsteadiness on feet: Secondary | ICD-10-CM | POA: Diagnosis not present

## 2018-12-27 DIAGNOSIS — M545 Low back pain: Secondary | ICD-10-CM | POA: Diagnosis not present

## 2018-12-27 DIAGNOSIS — M2560 Stiffness of unspecified joint, not elsewhere classified: Secondary | ICD-10-CM | POA: Diagnosis not present

## 2018-12-30 DIAGNOSIS — M6281 Muscle weakness (generalized): Secondary | ICD-10-CM | POA: Diagnosis not present

## 2018-12-30 DIAGNOSIS — M545 Low back pain: Secondary | ICD-10-CM | POA: Diagnosis not present

## 2018-12-30 DIAGNOSIS — M79652 Pain in left thigh: Secondary | ICD-10-CM | POA: Diagnosis not present

## 2018-12-30 DIAGNOSIS — Z96643 Presence of artificial hip joint, bilateral: Secondary | ICD-10-CM | POA: Diagnosis not present

## 2018-12-30 DIAGNOSIS — M79622 Pain in left upper arm: Secondary | ICD-10-CM | POA: Diagnosis not present

## 2018-12-30 DIAGNOSIS — R2681 Unsteadiness on feet: Secondary | ICD-10-CM | POA: Diagnosis not present

## 2018-12-30 DIAGNOSIS — M2560 Stiffness of unspecified joint, not elsewhere classified: Secondary | ICD-10-CM | POA: Diagnosis not present

## 2018-12-30 DIAGNOSIS — M5412 Radiculopathy, cervical region: Secondary | ICD-10-CM | POA: Diagnosis not present

## 2019-01-01 DIAGNOSIS — M5412 Radiculopathy, cervical region: Secondary | ICD-10-CM | POA: Diagnosis not present

## 2019-01-01 DIAGNOSIS — M6281 Muscle weakness (generalized): Secondary | ICD-10-CM | POA: Diagnosis not present

## 2019-01-01 DIAGNOSIS — M79652 Pain in left thigh: Secondary | ICD-10-CM | POA: Diagnosis not present

## 2019-01-01 DIAGNOSIS — R2681 Unsteadiness on feet: Secondary | ICD-10-CM | POA: Diagnosis not present

## 2019-01-01 DIAGNOSIS — M2560 Stiffness of unspecified joint, not elsewhere classified: Secondary | ICD-10-CM | POA: Diagnosis not present

## 2019-01-01 DIAGNOSIS — M79622 Pain in left upper arm: Secondary | ICD-10-CM | POA: Diagnosis not present

## 2019-01-01 DIAGNOSIS — Z96643 Presence of artificial hip joint, bilateral: Secondary | ICD-10-CM | POA: Diagnosis not present

## 2019-01-01 DIAGNOSIS — M545 Low back pain: Secondary | ICD-10-CM | POA: Diagnosis not present

## 2019-01-07 DIAGNOSIS — M545 Low back pain: Secondary | ICD-10-CM | POA: Diagnosis not present

## 2019-01-07 DIAGNOSIS — Z96643 Presence of artificial hip joint, bilateral: Secondary | ICD-10-CM | POA: Diagnosis not present

## 2019-01-07 DIAGNOSIS — M79652 Pain in left thigh: Secondary | ICD-10-CM | POA: Diagnosis not present

## 2019-01-07 DIAGNOSIS — M5412 Radiculopathy, cervical region: Secondary | ICD-10-CM | POA: Diagnosis not present

## 2019-01-07 DIAGNOSIS — M2560 Stiffness of unspecified joint, not elsewhere classified: Secondary | ICD-10-CM | POA: Diagnosis not present

## 2019-01-07 DIAGNOSIS — M6281 Muscle weakness (generalized): Secondary | ICD-10-CM | POA: Diagnosis not present

## 2019-01-07 DIAGNOSIS — R2681 Unsteadiness on feet: Secondary | ICD-10-CM | POA: Diagnosis not present

## 2019-01-07 DIAGNOSIS — M79622 Pain in left upper arm: Secondary | ICD-10-CM | POA: Diagnosis not present

## 2019-01-13 DIAGNOSIS — R2681 Unsteadiness on feet: Secondary | ICD-10-CM | POA: Diagnosis not present

## 2019-01-13 DIAGNOSIS — M2560 Stiffness of unspecified joint, not elsewhere classified: Secondary | ICD-10-CM | POA: Diagnosis not present

## 2019-01-13 DIAGNOSIS — M79622 Pain in left upper arm: Secondary | ICD-10-CM | POA: Diagnosis not present

## 2019-01-13 DIAGNOSIS — M79652 Pain in left thigh: Secondary | ICD-10-CM | POA: Diagnosis not present

## 2019-01-13 DIAGNOSIS — M5412 Radiculopathy, cervical region: Secondary | ICD-10-CM | POA: Diagnosis not present

## 2019-01-13 DIAGNOSIS — Z96643 Presence of artificial hip joint, bilateral: Secondary | ICD-10-CM | POA: Diagnosis not present

## 2019-01-13 DIAGNOSIS — M545 Low back pain: Secondary | ICD-10-CM | POA: Diagnosis not present

## 2019-01-13 DIAGNOSIS — M6281 Muscle weakness (generalized): Secondary | ICD-10-CM | POA: Diagnosis not present

## 2019-01-16 DIAGNOSIS — R2681 Unsteadiness on feet: Secondary | ICD-10-CM | POA: Diagnosis not present

## 2019-01-16 DIAGNOSIS — Z96643 Presence of artificial hip joint, bilateral: Secondary | ICD-10-CM | POA: Diagnosis not present

## 2019-01-16 DIAGNOSIS — M5412 Radiculopathy, cervical region: Secondary | ICD-10-CM | POA: Diagnosis not present

## 2019-01-16 DIAGNOSIS — M2560 Stiffness of unspecified joint, not elsewhere classified: Secondary | ICD-10-CM | POA: Diagnosis not present

## 2019-01-16 DIAGNOSIS — M6281 Muscle weakness (generalized): Secondary | ICD-10-CM | POA: Diagnosis not present

## 2019-01-16 DIAGNOSIS — M79622 Pain in left upper arm: Secondary | ICD-10-CM | POA: Diagnosis not present

## 2019-01-16 DIAGNOSIS — M79652 Pain in left thigh: Secondary | ICD-10-CM | POA: Diagnosis not present

## 2019-01-16 DIAGNOSIS — M545 Low back pain: Secondary | ICD-10-CM | POA: Diagnosis not present

## 2019-01-27 DIAGNOSIS — Z96643 Presence of artificial hip joint, bilateral: Secondary | ICD-10-CM | POA: Diagnosis not present

## 2019-01-27 DIAGNOSIS — M79622 Pain in left upper arm: Secondary | ICD-10-CM | POA: Diagnosis not present

## 2019-01-27 DIAGNOSIS — M5412 Radiculopathy, cervical region: Secondary | ICD-10-CM | POA: Diagnosis not present

## 2019-01-27 DIAGNOSIS — M545 Low back pain: Secondary | ICD-10-CM | POA: Diagnosis not present

## 2019-01-27 DIAGNOSIS — M79652 Pain in left thigh: Secondary | ICD-10-CM | POA: Diagnosis not present

## 2019-01-27 DIAGNOSIS — M2560 Stiffness of unspecified joint, not elsewhere classified: Secondary | ICD-10-CM | POA: Diagnosis not present

## 2019-01-27 DIAGNOSIS — R2681 Unsteadiness on feet: Secondary | ICD-10-CM | POA: Diagnosis not present

## 2019-01-27 DIAGNOSIS — M6281 Muscle weakness (generalized): Secondary | ICD-10-CM | POA: Diagnosis not present

## 2019-01-29 DIAGNOSIS — M79652 Pain in left thigh: Secondary | ICD-10-CM | POA: Diagnosis not present

## 2019-01-29 DIAGNOSIS — Z96643 Presence of artificial hip joint, bilateral: Secondary | ICD-10-CM | POA: Diagnosis not present

## 2019-01-29 DIAGNOSIS — Z23 Encounter for immunization: Secondary | ICD-10-CM | POA: Diagnosis not present

## 2019-01-29 DIAGNOSIS — R2681 Unsteadiness on feet: Secondary | ICD-10-CM | POA: Diagnosis not present

## 2019-01-29 DIAGNOSIS — M2560 Stiffness of unspecified joint, not elsewhere classified: Secondary | ICD-10-CM | POA: Diagnosis not present

## 2019-01-29 DIAGNOSIS — M79622 Pain in left upper arm: Secondary | ICD-10-CM | POA: Diagnosis not present

## 2019-01-29 DIAGNOSIS — M6281 Muscle weakness (generalized): Secondary | ICD-10-CM | POA: Diagnosis not present

## 2019-01-29 DIAGNOSIS — M545 Low back pain: Secondary | ICD-10-CM | POA: Diagnosis not present

## 2019-01-29 DIAGNOSIS — M5412 Radiculopathy, cervical region: Secondary | ICD-10-CM | POA: Diagnosis not present

## 2019-01-30 DIAGNOSIS — M4722 Other spondylosis with radiculopathy, cervical region: Secondary | ICD-10-CM | POA: Diagnosis not present

## 2019-01-30 DIAGNOSIS — R030 Elevated blood-pressure reading, without diagnosis of hypertension: Secondary | ICD-10-CM | POA: Diagnosis not present

## 2019-01-30 DIAGNOSIS — Z6828 Body mass index (BMI) 28.0-28.9, adult: Secondary | ICD-10-CM | POA: Diagnosis not present

## 2019-02-03 DIAGNOSIS — M6281 Muscle weakness (generalized): Secondary | ICD-10-CM | POA: Diagnosis not present

## 2019-02-03 DIAGNOSIS — M79622 Pain in left upper arm: Secondary | ICD-10-CM | POA: Diagnosis not present

## 2019-02-03 DIAGNOSIS — M2560 Stiffness of unspecified joint, not elsewhere classified: Secondary | ICD-10-CM | POA: Diagnosis not present

## 2019-02-03 DIAGNOSIS — M79652 Pain in left thigh: Secondary | ICD-10-CM | POA: Diagnosis not present

## 2019-02-03 DIAGNOSIS — Z96643 Presence of artificial hip joint, bilateral: Secondary | ICD-10-CM | POA: Diagnosis not present

## 2019-02-03 DIAGNOSIS — M545 Low back pain: Secondary | ICD-10-CM | POA: Diagnosis not present

## 2019-02-03 DIAGNOSIS — R2681 Unsteadiness on feet: Secondary | ICD-10-CM | POA: Diagnosis not present

## 2019-02-03 DIAGNOSIS — M5412 Radiculopathy, cervical region: Secondary | ICD-10-CM | POA: Diagnosis not present

## 2019-02-04 ENCOUNTER — Ambulatory Visit: Payer: Self-pay | Admitting: Family Medicine

## 2019-04-22 NOTE — Telephone Encounter (Signed)
This encounter was created in error - please disregard.

## 2019-05-06 ENCOUNTER — Telehealth (INDEPENDENT_AMBULATORY_CARE_PROVIDER_SITE_OTHER): Payer: Self-pay | Admitting: Internal Medicine

## 2019-05-06 NOTE — Telephone Encounter (Signed)
Patient called regarding refill on pantoprazole - stated pharmacy had faxed it on 2-22 - please advise - ph# [phone]

## 2019-05-07 ENCOUNTER — Other Ambulatory Visit: Payer: Self-pay | Admitting: Internal Medicine

## 2019-05-07 ENCOUNTER — Encounter (INDEPENDENT_AMBULATORY_CARE_PROVIDER_SITE_OTHER): Payer: Self-pay

## 2019-05-07 DIAGNOSIS — N6489 Other specified disorders of breast: Secondary | ICD-10-CM

## 2019-05-07 NOTE — Telephone Encounter (Signed)
Please ask the patient to have the pharmacy to resend the medication request. We have not seen it.

## 2019-06-10 DIAGNOSIS — F330 Major depressive disorder, recurrent, mild: Secondary | ICD-10-CM | POA: Diagnosis not present

## 2019-06-10 DIAGNOSIS — M25552 Pain in left hip: Secondary | ICD-10-CM | POA: Diagnosis not present

## 2019-06-10 DIAGNOSIS — F411 Generalized anxiety disorder: Secondary | ICD-10-CM | POA: Diagnosis not present

## 2019-06-10 DIAGNOSIS — D519 Vitamin B12 deficiency anemia, unspecified: Secondary | ICD-10-CM | POA: Diagnosis not present

## 2019-06-10 DIAGNOSIS — E782 Mixed hyperlipidemia: Secondary | ICD-10-CM | POA: Diagnosis not present

## 2019-06-10 DIAGNOSIS — R7301 Impaired fasting glucose: Secondary | ICD-10-CM | POA: Diagnosis not present

## 2019-06-10 DIAGNOSIS — E785 Hyperlipidemia, unspecified: Secondary | ICD-10-CM | POA: Diagnosis not present

## 2019-06-10 DIAGNOSIS — I10 Essential (primary) hypertension: Secondary | ICD-10-CM | POA: Diagnosis not present

## 2019-06-10 DIAGNOSIS — K277 Chronic peptic ulcer, site unspecified, without hemorrhage or perforation: Secondary | ICD-10-CM | POA: Diagnosis not present

## 2019-06-10 DIAGNOSIS — M4802 Spinal stenosis, cervical region: Secondary | ICD-10-CM | POA: Diagnosis not present

## 2019-06-10 DIAGNOSIS — R202 Paresthesia of skin: Secondary | ICD-10-CM | POA: Diagnosis not present

## 2019-06-10 DIAGNOSIS — E559 Vitamin D deficiency, unspecified: Secondary | ICD-10-CM | POA: Diagnosis not present

## 2019-06-10 DIAGNOSIS — M545 Low back pain: Secondary | ICD-10-CM | POA: Diagnosis not present

## 2019-06-10 DIAGNOSIS — M5442 Lumbago with sciatica, left side: Secondary | ICD-10-CM | POA: Diagnosis not present

## 2019-06-18 DIAGNOSIS — F321 Major depressive disorder, single episode, moderate: Secondary | ICD-10-CM | POA: Diagnosis not present

## 2019-06-18 DIAGNOSIS — R32 Unspecified urinary incontinence: Secondary | ICD-10-CM | POA: Diagnosis not present

## 2019-06-18 DIAGNOSIS — K277 Chronic peptic ulcer, site unspecified, without hemorrhage or perforation: Secondary | ICD-10-CM | POA: Diagnosis not present

## 2019-06-18 DIAGNOSIS — F411 Generalized anxiety disorder: Secondary | ICD-10-CM | POA: Diagnosis not present

## 2019-06-18 DIAGNOSIS — M4802 Spinal stenosis, cervical region: Secondary | ICD-10-CM | POA: Diagnosis not present

## 2019-06-18 DIAGNOSIS — G629 Polyneuropathy, unspecified: Secondary | ICD-10-CM | POA: Diagnosis not present

## 2019-06-18 DIAGNOSIS — E782 Mixed hyperlipidemia: Secondary | ICD-10-CM | POA: Diagnosis not present

## 2019-06-18 DIAGNOSIS — M25552 Pain in left hip: Secondary | ICD-10-CM | POA: Diagnosis not present

## 2019-06-18 DIAGNOSIS — M545 Low back pain: Secondary | ICD-10-CM | POA: Diagnosis not present

## 2019-06-23 ENCOUNTER — Other Ambulatory Visit (HOSPITAL_COMMUNITY): Payer: Self-pay | Admitting: Internal Medicine

## 2019-06-23 ENCOUNTER — Other Ambulatory Visit: Payer: Self-pay | Admitting: Internal Medicine

## 2019-06-23 DIAGNOSIS — M545 Low back pain, unspecified: Secondary | ICD-10-CM

## 2019-06-24 ENCOUNTER — Other Ambulatory Visit: Payer: Self-pay

## 2019-06-24 ENCOUNTER — Ambulatory Visit
Admission: RE | Admit: 2019-06-24 | Discharge: 2019-06-24 | Disposition: A | Discharge status: Home / Self Care | Payer: Medicare Other | Source: Ambulatory Visit | Attending: Internal Medicine | Admitting: Internal Medicine

## 2019-06-24 DIAGNOSIS — R928 Other abnormal and inconclusive findings on diagnostic imaging of breast: Secondary | ICD-10-CM | POA: Diagnosis not present

## 2019-06-24 DIAGNOSIS — N6489 Other specified disorders of breast: Secondary | ICD-10-CM

## 2019-06-30 ENCOUNTER — Other Ambulatory Visit: Payer: Self-pay | Admitting: Internal Medicine

## 2019-06-30 DIAGNOSIS — M545 Low back pain, unspecified: Secondary | ICD-10-CM

## 2019-07-16 ENCOUNTER — Telehealth (INDEPENDENT_AMBULATORY_CARE_PROVIDER_SITE_OTHER): Payer: Self-pay | Admitting: Internal Medicine

## 2019-07-16 NOTE — Telephone Encounter (Signed)
Patient called stated she is having severe abdominal pain and needs to be seen - please ask Dr Karilyn Cota when can he see her -

## 2019-07-18 NOTE — Telephone Encounter (Signed)
Patient was called back and was told to go to the ED - patient stated she wasn't going to the ED - she doesn't trust them and stated it looks like she will have to find another doctor

## 2019-07-18 NOTE — Telephone Encounter (Signed)
This was sent to Dr.Rehman. He said that the patient needs to go to the ED with this severe Abdominal Pain. Please advise.

## 2019-07-21 ENCOUNTER — Ambulatory Visit (INDEPENDENT_AMBULATORY_CARE_PROVIDER_SITE_OTHER): Payer: Medicare Other | Admitting: Internal Medicine

## 2019-07-21 ENCOUNTER — Encounter (INDEPENDENT_AMBULATORY_CARE_PROVIDER_SITE_OTHER): Payer: Self-pay | Admitting: Internal Medicine

## 2019-07-21 ENCOUNTER — Other Ambulatory Visit: Payer: Self-pay

## 2019-07-21 ENCOUNTER — Encounter (INDEPENDENT_AMBULATORY_CARE_PROVIDER_SITE_OTHER): Payer: Self-pay | Admitting: *Deleted

## 2019-07-21 VITALS — BP 165/88 | HR 59 | Temp 97.4°F | Ht 62.0 in | Wt 158.7 lb

## 2019-07-21 DIAGNOSIS — R109 Unspecified abdominal pain: Secondary | ICD-10-CM | POA: Diagnosis not present

## 2019-07-21 DIAGNOSIS — K279 Peptic ulcer, site unspecified, unspecified as acute or chronic, without hemorrhage or perforation: Secondary | ICD-10-CM

## 2019-07-21 MED ORDER — DICYCLOMINE HCL 10 MG PO CAPS: 10.0000 mg | ORAL_CAPSULE | Freq: Two times a day (BID) | ORAL | 2 refills | Status: DC | PRN

## 2019-07-21 NOTE — Patient Instructions (Signed)
Decrease pantoprazole to once daily.  Take it 30 minutes before breakfast. Physician will call with results of abdominal pelvic CT when completed. Dicyclomine 10 mg by mouth 30 minutes before breakfast and lunch or 30 minutes before breakfast and evening meal.

## 2019-07-21 NOTE — Progress Notes (Signed)
Presenting complaint;   Mid and lower abdominal pain.  Database and subjective:  Patient is 69 year old Caucasian female who has a history of gastric ulcer called last week and requested to be seen.  Patient was hospitalized in January last year with excruciating periumbilical pain with nausea and vomiting.  Patient had been on NSAIDs.  CT revealed deep gastric ulcer with pneumatosis.  H. pylori serology was negative.  She was treated with IV pantoprazole and kept n.p.o. for for about 2 days and improved.  She underwent esophagogastroduodenoscopy in July 2020 which revealed antral scar consistent with healed ulcer.  Patient's PPI dose was decreased to pantoprazole 40 mg daily.  She also underwent high rescreening colonoscopy at the time of EGD revealing colonic diverticulosis and hemorrhoids.  Patient called her office about 10 weeks ago for refill on pantoprazole and double dose.  That is when her pain started.  She says this pain is mild and nothing like what she had in January 2020.  Even though pain is not intense but she has noted it to be more pronounced over the last 2 weeks.  She is unable to sleep at night she reports no improvement since she has been on double dose PPI.  She points to her upper and lower abdomen as a site of pain.  She says she is not constipated.  She takes Colace every day.  She is also using MiraLAX no more than once a week.  She has not experienced nausea vomiting melena or rectal bleeding.  However she has noted fullness in left pectoral region after her meals and this last for 10 minutes or so.  She is not having diaphoreses or shortness of breath.  She also denies dysphagia or heartburn.  She has not lost any weight since the symptoms started.  She is not taking any NSAIDs. She denies extremity or muscle pain.  She says abdominal pain does not change with physical activity when she eats her meals or has a bowel movement. She says she has been on Lipitor same dose for  several years. She saw Dr. Delphina Cahill few weeks ago and had blood work and was all normal.  It is reviewed below. Review of the systems is positive for chronic neck pain resulting from spinal stenosis.  She generally takes pain medication at bedtime so she can rest.  She also has low back pain.  Current Medications: Outpatient Encounter Medications as of 07/21/2019  Medication Sig  . atorvastatin (LIPITOR) 80 MG tablet Take 80 mg by mouth at bedtime.   Marland Kitchen BIOTIN 5000 PO Take 5,000 mg by mouth.  . docusate sodium (COLACE) 100 MG capsule Take 100 mg by mouth daily.  Marland Kitchen escitalopram (LEXAPRO) 5 MG tablet Take 5 mg by mouth at bedtime.   Marland Kitchen ezetimibe (ZETIA) 10 MG tablet Take 10 mg by mouth at bedtime.   Marland Kitchen HYDROcodone-acetaminophen (NORCO/VICODIN) 5-325 MG tablet Take 1 tablet by mouth as needed.   Marland Kitchen LORazepam (ATIVAN) 2 MG tablet Take 2 mg by mouth at bedtime.  . mirabegron ER (MYRBETRIQ) 25 MG TB24 tablet Take 25 mg by mouth daily.  . Multiple Vitamin (MULTIVITAMIN ADULT PO) Take by mouth daily.  . pantoprazole (PROTONIX) 40 MG tablet TAKE 1 TABLET(40 MG) BY MOUTH TWICE DAILY BEFORE A MEAL  . Turmeric 500 MG CAPS Take 500 mg by mouth daily.  Marland Kitchen VITAMIN D PO Take 5,000 Units by mouth daily.  . [DISCONTINUED] acetaminophen (TYLENOL) 650 MG CR tablet Take 1,300 mg by  mouth 2 (two) times daily as needed for pain.   No facility-administered encounter medications on file as of 07/21/2019.     Objective: Blood pressure (!) 165/88, pulse (!) 59, temperature (!) 97.4 F (36.3 C), temperature source Temporal, height 5\' 2"  (1.575 m), weight 158 lb 11.2 oz (72 kg). Patient is alert and in no acute distress. She is wearing a facial mask. Conjunctiva is pink. Sclera is nonicteric Oropharyngeal mucosa is normal. No neck masses or thyromegaly noted. Cardiac exam with regular rhythm normal S1 and S2.  She has faint systolic murmur best heard at aortic area. Lungs are clear to auscultation. Abdomen is  symmetrical.  Bowel sounds are hyperactive.  On palpation abdomen is soft.  She does not have tenderness at epigastrium below the costal margins.  She has mild tenderness across mid and lower abdomen.  Tenderness is deep and not superficial.  There is no guarding or rebound.  No organomegaly or masses. No LE edema or clubbing noted.  Labs/studies Results: Lab data from 06/10/2019 WBC 4.1 H&H 14.3 and 42.0 Platelet count 219K  Glucose 89 BUN 17 and creatinine 0.80 Serum sodium 144, potassium 4.5, chloride 105, CO2 25 Serum calcium 9.1 Bilirubin 0.9, AP 84, AST 31, ALT 32, total protein 7.1 and albumin 4.3.  Vitamin D2 level 39.4   Assessment:  #1.  Lower and mid abdominal pain in a patient with history of deep gastric ulcer requiring hospitalization about 16 months ago.  This pain is different than the pain she had.  She has no associated symptoms such as nausea vomiting diarrhea melena or rectal bleeding.  Pain does not appear to be superficial or musculoskeletal.  Source of this pain is not clear.  Symptoms are not typical of IBS.  #2.  History of gastric ulcer diagnosed on CT of January 2020 and follow-up EGD in July 2020 revealed complete healing.  H. pylori negative.  Peptic ulcer was felt to be due to NSAID therapy and she is not taking NSAIDs anymore.  I do not believe current pain is due to peptic ulcer disease as I would expect her to do better with double dose PPI.  Plan:  Decrease pantoprazole to 40 mg by mouth 30 minutes before breakfast daily. Dicyclomine 10 mg by mouth before meals.  Take it before breakfast and lunch or before breakfast and evening meal. Patient informed of potential side effects particularly dry mouth and constipation. Proceed with abdominal pelvic CT. Timing of follow-up visit to be determined after CT completed and reviewed.

## 2019-07-22 ENCOUNTER — Ambulatory Visit (HOSPITAL_COMMUNITY)
Admission: RE | Admit: 2019-07-22 | Discharge: 2019-07-22 | Disposition: A | Discharge status: Home / Self Care | Payer: Medicare Other | Source: Ambulatory Visit | Attending: Internal Medicine | Admitting: Internal Medicine

## 2019-07-22 DIAGNOSIS — R109 Unspecified abdominal pain: Secondary | ICD-10-CM

## 2019-07-22 DIAGNOSIS — K5730 Diverticulosis of large intestine without perforation or abscess without bleeding: Secondary | ICD-10-CM | POA: Diagnosis not present

## 2019-07-22 LAB — POCT I-STAT CREATININE: Creatinine, Ser: 0.8 mg/dL (ref 0.44–1.00)

## 2019-07-22 MED ORDER — IOHEXOL 300 MG/ML  SOLN
100.0000 mL | Freq: Once | INTRAMUSCULAR | Status: AC | PRN
  Administered 2019-07-22: 100 mL via INTRAVENOUS

## 2019-07-26 ENCOUNTER — Ambulatory Visit
Admission: RE | Admit: 2019-07-26 | Discharge: 2019-07-26 | Disposition: A | Discharge status: Home / Self Care | Payer: Medicare Other | Source: Ambulatory Visit | Attending: Internal Medicine | Admitting: Internal Medicine

## 2019-07-26 ENCOUNTER — Other Ambulatory Visit: Payer: Self-pay

## 2019-07-26 DIAGNOSIS — M545 Low back pain, unspecified: Secondary | ICD-10-CM

## 2019-07-26 DIAGNOSIS — M4807 Spinal stenosis, lumbosacral region: Secondary | ICD-10-CM | POA: Diagnosis not present

## 2019-07-26 MED ORDER — GADOBENATE DIMEGLUMINE 529 MG/ML IV SOLN
17.0000 mL | Freq: Once | INTRAVENOUS | Status: AC | PRN
  Administered 2019-07-26: 17 mL via INTRAVENOUS

## 2019-07-29 DIAGNOSIS — G629 Polyneuropathy, unspecified: Secondary | ICD-10-CM | POA: Diagnosis not present

## 2019-07-29 DIAGNOSIS — M4726 Other spondylosis with radiculopathy, lumbar region: Secondary | ICD-10-CM | POA: Diagnosis not present

## 2019-07-29 DIAGNOSIS — F411 Generalized anxiety disorder: Secondary | ICD-10-CM | POA: Diagnosis not present

## 2019-07-29 DIAGNOSIS — M4802 Spinal stenosis, cervical region: Secondary | ICD-10-CM | POA: Diagnosis not present

## 2019-07-29 DIAGNOSIS — M4722 Other spondylosis with radiculopathy, cervical region: Secondary | ICD-10-CM | POA: Diagnosis not present

## 2019-07-29 DIAGNOSIS — R32 Unspecified urinary incontinence: Secondary | ICD-10-CM | POA: Diagnosis not present

## 2019-07-29 DIAGNOSIS — F321 Major depressive disorder, single episode, moderate: Secondary | ICD-10-CM | POA: Diagnosis not present

## 2019-08-13 ENCOUNTER — Other Ambulatory Visit: Payer: Self-pay

## 2019-08-13 ENCOUNTER — Encounter: Payer: Self-pay | Admitting: Emergency Medicine

## 2019-08-13 ENCOUNTER — Encounter (HOSPITAL_COMMUNITY): Payer: Self-pay | Admitting: *Deleted

## 2019-08-13 ENCOUNTER — Ambulatory Visit (INDEPENDENT_AMBULATORY_CARE_PROVIDER_SITE_OTHER)
Admission: EM | Admit: 2019-08-13 | Discharge: 2019-08-13 | Disposition: A | Discharge status: Home / Self Care | Payer: Medicare Other | Source: Home / Self Care

## 2019-08-13 ENCOUNTER — Emergency Department (HOSPITAL_COMMUNITY)
Admission: EM | Admit: 2019-08-13 | Discharge: 2019-08-13 | Disposition: A | Discharge status: Home / Self Care | Payer: Medicare Other | Attending: Emergency Medicine | Admitting: Emergency Medicine

## 2019-08-13 DIAGNOSIS — Z5321 Procedure and treatment not carried out due to patient leaving prior to being seen by health care provider: Secondary | ICD-10-CM | POA: Insufficient documentation

## 2019-08-13 DIAGNOSIS — M25552 Pain in left hip: Secondary | ICD-10-CM | POA: Insufficient documentation

## 2019-08-13 DIAGNOSIS — Z96642 Presence of left artificial hip joint: Secondary | ICD-10-CM | POA: Diagnosis not present

## 2019-08-13 DIAGNOSIS — M5442 Lumbago with sciatica, left side: Secondary | ICD-10-CM | POA: Diagnosis not present

## 2019-08-13 HISTORY — DX: Spinal stenosis, site unspecified: M48.00

## 2019-08-13 HISTORY — DX: Gastric ulcer, unspecified as acute or chronic, without hemorrhage or perforation: K25.9

## 2019-08-13 MED ORDER — CYCLOBENZAPRINE HCL 5 MG PO TABS
5.0000 mg | ORAL_TABLET | Freq: Every day | ORAL | 0 refills | Status: DC
Start: 2019-08-13 — End: 2020-12-21

## 2019-08-13 MED ORDER — DEXAMETHASONE SODIUM PHOSPHATE 10 MG/ML IJ SOLN
10.0000 mg | Freq: Once | INTRAMUSCULAR | Status: AC
  Administered 2019-08-13: 10 mg via INTRAMUSCULAR

## 2019-08-13 MED ORDER — PREDNISONE 10 MG (21) PO TBPK
ORAL_TABLET | Freq: Every day | ORAL | 0 refills | Status: DC
Start: 2019-08-13 — End: 2020-08-06

## 2019-08-13 NOTE — Discharge Instructions (Addendum)
Steroid shot given in office Continue conservative management of rest, ice, and gentle stretches Prednisone prescribed.  Take as directed and to completion Take cyclobenzaprine at nighttime for symptomatic relief. Avoid driving or operating heavy machinery while using medication. Follow up with PCP if symptoms persist Return or go to the ER if you have any new or worsening symptoms (fever, chills, chest pain, abdominal pain, changes in bowel or bladder habits, pain radiating into lower legs, etc...)  

## 2019-08-13 NOTE — ED Provider Notes (Signed)
[facility]   [id number] 08/13/19 Arrival Time: 1652  TM:HDQQI PAIN  SUBJECTIVE: History from: patient. Cynthia Cochran is a 69 y.o. female complains of low back and LT hip pain that began this morning.  Denies a precipitating event or specific injury.  Localizes the pain to the LT low back and radiates into outside of left hip.  Describes the pain as constant and sharp in character.  Has tried hydrocodone with relief.  Symptoms are made worse with activity.  Reports similar symptoms in the past with sciatica.  Denies fever, chills, erythema, ecchymosis, effusion, weakness, numbness and tingling, saddle paresthesias, loss of bowel or bladder function.      ROS: As per HPI.  All other pertinent ROS negative.     Past Medical History:  Diagnosis Date  . Anxiety   . Arthritis   . Blood transfusion without reported diagnosis 1982   after c section  . Dysphagia 04/25/2016  . Gastric ulcer   . High cholesterol 04/25/2016  . Hyperlipidemia   . Mixed stress and urge urinary incontinence 09/09/2015  . Sciatic nerve pain   . Spinal stenosis    Past Surgical History:  Procedure Laterality Date  . BIOPSY  05/12/2016   Procedure: BIOPSY;  Surgeon: Rogene Houston, MD;  [facility];  Service: Endoscopy;;  gastric  . BIOPSY  10/04/2018   Procedure: BIOPSY;  Surgeon: Rogene Houston, MD;  [facility];  Service: Endoscopy;;  gastric   . CARPAL TUNNEL RELEASE     right; 1998, left 1998  . [place]  . COLONOSCOPY WITH PROPOFOL N/A 10/04/2018   Procedure: COLONOSCOPY WITH PROPOFOL;  Surgeon: Rogene Houston, MD;  [facility]: [facility];  Service: Endoscopy;  Laterality: N/A;  7:30  . Colonscopy    . ESOPHAGEAL DILATION N/A 05/12/2016   Procedure: ESOPHAGEAL DILATION;  Surgeon: Rogene Houston, MD;  [facility];  Service: Endoscopy;  Laterality: N/A;  . ESOPHAGOGASTRODUODENOSCOPY N/A 05/12/2016   Procedure: ESOPHAGOGASTRODUODENOSCOPY (EGD);   Surgeon: Rogene Houston, MD;  [facility];  Service: Endoscopy;  Laterality: N/A;  9:30  . ESOPHAGOGASTRODUODENOSCOPY (EGD) WITH PROPOFOL N/A 10/04/2018   Procedure: ESOPHAGOGASTRODUODENOSCOPY (EGD) WITH PROPOFOL;  Surgeon: Rogene Houston, MD;  [facility]: [facility];  Service: Endoscopy;  Laterality: N/A;  . TOTAL HIP ARTHROPLASTY     right; March 2013; left March 2011   Allergies  Allergen Reactions  . Gabapentin Other (See Comments)    Feet went numb  . Lyrica [Pregabalin]   . Mobic [Meloxicam] Other (See Comments)    Irritates stomach  . Nsaids Other (See Comments)    Irritates stomach - MOBIC     No current facility-administered medications on file prior to encounter.   Current Outpatient Medications on File Prior to Encounter  Medication Sig Dispense Refill  . atorvastatin (LIPITOR) 80 MG tablet Take 80 mg by mouth at bedtime.     Marland Kitchen BIOTIN 5000 PO Take 5,000 mg by mouth.    . dicyclomine (BENTYL) 10 MG capsule Take 1 capsule (10 mg total) by mouth 2 (two) times daily as needed for spasms. 60 capsule 2  . docusate sodium (COLACE) 100 MG capsule Take 100 mg by mouth daily.    Marland Kitchen escitalopram (LEXAPRO) 5 MG tablet Take 5 mg by mouth at bedtime.     Marland Kitchen ezetimibe (ZETIA) 10 MG tablet Take 10 mg by mouth at bedtime.     Marland Kitchen  HYDROcodone-acetaminophen (NORCO/VICODIN) 5-325 MG tablet Take 1 tablet by mouth as needed.     Marland Kitchen LORazepam (ATIVAN) 2 MG tablet Take 2 mg by mouth at bedtime.  2  . mirabegron ER (MYRBETRIQ) 25 MG TB24 tablet Take 25 mg by mouth daily.    . Multiple Vitamin (MULTIVITAMIN ADULT PO) Take by mouth daily.    . pantoprazole (PROTONIX) 40 MG tablet Take 1 tablet (40 mg total) by mouth daily before breakfast. 90 tablet 3  . polyethylene glycol (MIRALAX / GLYCOLAX) 17 g packet Take 17 g by mouth daily as needed.    . Turmeric 500 MG CAPS Take 500 mg by mouth daily.    Marland Kitchen VITAMIN D PO Take 5,000 Units by mouth daily.     Social History   Socioeconomic  History  . Marital status: Widowed    Spouse name: Not on file  . Number of children: 0  . Years of education: some college  . Highest education level: Not on file  Occupational History  . Occupation: Retired  Tobacco Use  . Smoking status: Never Smoker  . Smokeless tobacco: Never Used  Substance and Sexual Activity  . Alcohol use: Yes    Comment: wine once a week  . Drug use: No  . Sexual activity: Not Currently    Birth control/protection: Post-menopausal  Other Topics Concern  . Not on file  Social History Narrative   Lives alone   Caffeine use: no more than 2 cups per day    16 oz soda per day   Right handed    Social Determinants of Health   Financial Resource Strain:   . Difficulty of Paying Living Expenses:   Food Insecurity:   . Worried About Programme researcher, broadcasting/film/video in the Last Year:   . Barista in the Last Year:   Transportation Needs:   . Freight forwarder (Medical):   Marland Kitchen Lack of Transportation (Non-Medical):   Physical Activity:   . Days of Exercise per Week:   . Minutes of Exercise per Session:   Stress:   . Feeling of Stress :   Social Connections:   . Frequency of Communication with Friends and Family:   . Frequency of Social Gatherings with Friends and Family:   . Attends Religious Services:   . Active Member of Clubs or Organizations:   . Attends Banker Meetings:   Marland Kitchen Marital Status:   Intimate Partner Violence:   . Fear of Current or Ex-Partner:   . Emotionally Abused:   Marland Kitchen Physically Abused:   . Sexually Abused:    Family History  Problem Relation Age of Onset  . Colon cancer Father 82  . Colon cancer Brother 50  . Other Mother        heavy smoker; died from arterial sclerosis  . Hypertension Sister   . Stomach cancer Neg Hx     OBJECTIVE:  Vitals:   08/13/19 1703 08/13/19 1704 08/13/19 1722  BP:  (!) 188/102   Pulse:  (!) 46 (!) 52  Resp:  18   Temp:  98.4 F (36.9 C)   TempSrc:  Oral   SpO2:  96%     Weight: 155 lb (70.3 kg)    Height: 5\' 2"  (1.575 m)      General appearance: ALERT; in no acute distress.  Head: NCAT Lungs: Normal respiratory effort; CTAB CV: bradycardic Musculoskeletal: Back Inspection: Skin warm, dry, clear and intact without obvious erythema, effusion, or  ecchymosis.  Palpation: TTP over lateral LT hip ROM: FROM active and passive Strength: 5/5 shld abduction, 5/5 shld adduction, 5/5 elbow flexion, 5/5 elbow extension, 5/5 grip strength, 5/5 hip flexion, 5/5 hip extension, 5/5 knee abduction, 5/5 knee adduction, 5/5 knee flexion, 5/5 knee extension Skin: warm and dry Neurologic: Ambulates without difficulty; Sensation intact about the upper/ lower extremities Psychological: alert and cooperative; normal mood and affect   ASSESSMENT & PLAN:  1. Acute low back pain with left-sided sciatica, unspecified back pain laterality   2. Left hip pain      Meds ordered this encounter  Medications  . dexamethasone (DECADRON) injection 10 mg  . predniSONE (STERAPRED UNI-PAK 21 TAB) 10 MG (21) TBPK tablet    Sig: Take by mouth daily. Take 6 tabs by mouth daily  for 2 days, then 5 tabs for 2 days, then 4 tabs for 2 days, then 3 tabs for 2 days, 2 tabs for 2 days, then 1 tab by mouth daily for 2 days    Dispense:  42 tablet    Refill:  0    Order Specific Question:   Supervising Provider    Answer:   Eustace Moore [[id number]]  . cyclobenzaprine (FLEXERIL) 5 MG tablet    Sig: Take 1 tablet (5 mg total) by mouth at bedtime.    Dispense:  12 tablet    Refill:  0    Order Specific Question:   Supervising Provider    Answer:   Eustace Moore [[id number]]   Steroid shot given in office Continue conservative management of rest, ice, and gentle stretches Prednisone prescribed.  Take as directed and to completion Take cyclobenzaprine at nighttime for symptomatic relief. Avoid driving or operating heavy machinery while using medication. Follow up with PCP if symptoms  persist Return or go to the ER if you have any new or worsening symptoms (fever, chills, chest pain, abdominal pain, changes in bowel or bladder habits, pain radiating into lower legs, etc...)   Reviewed expectations re: course of current medical issues. Questions answered. Outlined signs and symptoms indicating need for more acute intervention. Patient verbalized understanding. After Visit Summary given.    Rennis Harding, PA-C 08/13/19 1722

## 2019-08-13 NOTE — ED Triage Notes (Signed)
Pt c/o left hip pain that started this morning. Pt reports the pain started in her lower back and then moved to her left hip. Pt has had left hip replacement in the past. Denies recent injury. Pt reports she has taken 2 Hydrocodone and used analgesic spray without any pain relief.

## 2019-08-13 NOTE — ED Triage Notes (Signed)
LT hip pain that started this morning. Hx of lower back pain.  Took 1 5/325mg  hydrocodone at 1pm and 1 at 4pm. Has had some relief after second dose.

## 2019-10-01 ENCOUNTER — Telehealth (INDEPENDENT_AMBULATORY_CARE_PROVIDER_SITE_OTHER): Payer: Self-pay | Admitting: *Deleted

## 2019-10-02 NOTE — Telephone Encounter (Signed)
A PA was completed. Awaiting the response from [facility].

## 2019-10-27 ENCOUNTER — Other Ambulatory Visit (INDEPENDENT_AMBULATORY_CARE_PROVIDER_SITE_OTHER): Payer: Self-pay | Admitting: Internal Medicine

## 2019-12-01 DIAGNOSIS — M9902 Segmental and somatic dysfunction of thoracic region: Secondary | ICD-10-CM | POA: Diagnosis not present

## 2019-12-01 DIAGNOSIS — M9903 Segmental and somatic dysfunction of lumbar region: Secondary | ICD-10-CM | POA: Diagnosis not present

## 2019-12-01 DIAGNOSIS — M546 Pain in thoracic spine: Secondary | ICD-10-CM | POA: Diagnosis not present

## 2019-12-01 DIAGNOSIS — M545 Low back pain: Secondary | ICD-10-CM | POA: Diagnosis not present

## 2019-12-01 DIAGNOSIS — M9905 Segmental and somatic dysfunction of pelvic region: Secondary | ICD-10-CM | POA: Diagnosis not present

## 2019-12-01 DIAGNOSIS — M9901 Segmental and somatic dysfunction of cervical region: Secondary | ICD-10-CM | POA: Diagnosis not present

## 2019-12-01 DIAGNOSIS — M542 Cervicalgia: Secondary | ICD-10-CM | POA: Diagnosis not present

## 2019-12-17 DIAGNOSIS — Z23 Encounter for immunization: Secondary | ICD-10-CM | POA: Diagnosis not present

## 2019-12-23 DIAGNOSIS — Z23 Encounter for immunization: Secondary | ICD-10-CM | POA: Diagnosis not present

## 2019-12-25 DIAGNOSIS — Z6828 Body mass index (BMI) 28.0-28.9, adult: Secondary | ICD-10-CM | POA: Diagnosis not present

## 2019-12-25 DIAGNOSIS — R945 Abnormal results of liver function studies: Secondary | ICD-10-CM | POA: Diagnosis not present

## 2019-12-25 DIAGNOSIS — F411 Generalized anxiety disorder: Secondary | ICD-10-CM | POA: Diagnosis not present

## 2019-12-25 DIAGNOSIS — M5442 Lumbago with sciatica, left side: Secondary | ICD-10-CM | POA: Diagnosis not present

## 2019-12-25 DIAGNOSIS — R2689 Other abnormalities of gait and mobility: Secondary | ICD-10-CM | POA: Diagnosis not present

## 2019-12-25 DIAGNOSIS — M25552 Pain in left hip: Secondary | ICD-10-CM | POA: Diagnosis not present

## 2019-12-25 DIAGNOSIS — Z8711 Personal history of peptic ulcer disease: Secondary | ICD-10-CM | POA: Diagnosis not present

## 2019-12-25 DIAGNOSIS — I10 Essential (primary) hypertension: Secondary | ICD-10-CM | POA: Diagnosis not present

## 2019-12-25 DIAGNOSIS — E782 Mixed hyperlipidemia: Secondary | ICD-10-CM | POA: Diagnosis not present

## 2019-12-25 DIAGNOSIS — R42 Dizziness and giddiness: Secondary | ICD-10-CM | POA: Diagnosis not present

## 2019-12-25 DIAGNOSIS — R202 Paresthesia of skin: Secondary | ICD-10-CM | POA: Diagnosis not present

## 2019-12-25 DIAGNOSIS — R438 Other disturbances of smell and taste: Secondary | ICD-10-CM | POA: Diagnosis not present

## 2019-12-30 ENCOUNTER — Other Ambulatory Visit: Payer: Self-pay | Admitting: Internal Medicine

## 2019-12-30 DIAGNOSIS — E663 Overweight: Secondary | ICD-10-CM | POA: Diagnosis not present

## 2019-12-30 DIAGNOSIS — F411 Generalized anxiety disorder: Secondary | ICD-10-CM | POA: Diagnosis not present

## 2019-12-30 DIAGNOSIS — Z6828 Body mass index (BMI) 28.0-28.9, adult: Secondary | ICD-10-CM | POA: Diagnosis not present

## 2019-12-30 DIAGNOSIS — K277 Chronic peptic ulcer, site unspecified, without hemorrhage or perforation: Secondary | ICD-10-CM | POA: Diagnosis not present

## 2019-12-30 DIAGNOSIS — M5442 Lumbago with sciatica, left side: Secondary | ICD-10-CM | POA: Diagnosis not present

## 2019-12-30 DIAGNOSIS — N6489 Other specified disorders of breast: Secondary | ICD-10-CM

## 2019-12-30 DIAGNOSIS — M4802 Spinal stenosis, cervical region: Secondary | ICD-10-CM | POA: Diagnosis not present

## 2019-12-30 DIAGNOSIS — Z0001 Encounter for general adult medical examination with abnormal findings: Secondary | ICD-10-CM | POA: Diagnosis not present

## 2019-12-30 DIAGNOSIS — M542 Cervicalgia: Secondary | ICD-10-CM | POA: Diagnosis not present

## 2019-12-30 DIAGNOSIS — F321 Major depressive disorder, single episode, moderate: Secondary | ICD-10-CM | POA: Diagnosis not present

## 2019-12-30 DIAGNOSIS — M545 Low back pain, unspecified: Secondary | ICD-10-CM | POA: Diagnosis not present

## 2019-12-30 DIAGNOSIS — G629 Polyneuropathy, unspecified: Secondary | ICD-10-CM | POA: Diagnosis not present

## 2019-12-30 DIAGNOSIS — E782 Mixed hyperlipidemia: Secondary | ICD-10-CM | POA: Diagnosis not present

## 2019-12-30 DIAGNOSIS — R32 Unspecified urinary incontinence: Secondary | ICD-10-CM | POA: Diagnosis not present

## 2020-01-14 DIAGNOSIS — M5413 Radiculopathy, cervicothoracic region: Secondary | ICD-10-CM | POA: Diagnosis not present

## 2020-01-14 DIAGNOSIS — M546 Pain in thoracic spine: Secondary | ICD-10-CM | POA: Diagnosis not present

## 2020-01-14 DIAGNOSIS — M9903 Segmental and somatic dysfunction of lumbar region: Secondary | ICD-10-CM | POA: Diagnosis not present

## 2020-01-14 DIAGNOSIS — M5442 Lumbago with sciatica, left side: Secondary | ICD-10-CM | POA: Diagnosis not present

## 2020-01-14 DIAGNOSIS — M9902 Segmental and somatic dysfunction of thoracic region: Secondary | ICD-10-CM | POA: Diagnosis not present

## 2020-01-14 DIAGNOSIS — M9905 Segmental and somatic dysfunction of pelvic region: Secondary | ICD-10-CM | POA: Diagnosis not present

## 2020-01-14 DIAGNOSIS — M9901 Segmental and somatic dysfunction of cervical region: Secondary | ICD-10-CM | POA: Diagnosis not present

## 2020-01-21 ENCOUNTER — Ambulatory Visit: Payer: Medicare Other

## 2020-01-21 ENCOUNTER — Ambulatory Visit
Admission: RE | Admit: 2020-01-21 | Discharge: 2020-01-21 | Disposition: A | Discharge status: Home / Self Care | Payer: Medicare Other | Source: Ambulatory Visit | Attending: Internal Medicine | Admitting: Internal Medicine

## 2020-01-21 ENCOUNTER — Other Ambulatory Visit: Payer: Self-pay

## 2020-01-21 DIAGNOSIS — R928 Other abnormal and inconclusive findings on diagnostic imaging of breast: Secondary | ICD-10-CM | POA: Diagnosis not present

## 2020-01-21 DIAGNOSIS — N6489 Other specified disorders of breast: Secondary | ICD-10-CM

## 2020-01-27 DIAGNOSIS — G609 Hereditary and idiopathic neuropathy, unspecified: Secondary | ICD-10-CM | POA: Diagnosis not present

## 2020-02-09 DIAGNOSIS — M5416 Radiculopathy, lumbar region: Secondary | ICD-10-CM | POA: Diagnosis not present

## 2020-02-17 DIAGNOSIS — R200 Anesthesia of skin: Secondary | ICD-10-CM | POA: Diagnosis not present

## 2020-02-24 DIAGNOSIS — M4722 Other spondylosis with radiculopathy, cervical region: Secondary | ICD-10-CM | POA: Diagnosis not present

## 2020-03-22 DIAGNOSIS — R21 Rash and other nonspecific skin eruption: Secondary | ICD-10-CM | POA: Diagnosis not present

## 2020-05-03 DIAGNOSIS — M9905 Segmental and somatic dysfunction of pelvic region: Secondary | ICD-10-CM | POA: Diagnosis not present

## 2020-05-03 DIAGNOSIS — M9903 Segmental and somatic dysfunction of lumbar region: Secondary | ICD-10-CM | POA: Diagnosis not present

## 2020-05-03 DIAGNOSIS — M9902 Segmental and somatic dysfunction of thoracic region: Secondary | ICD-10-CM | POA: Diagnosis not present

## 2020-05-03 DIAGNOSIS — M5413 Radiculopathy, cervicothoracic region: Secondary | ICD-10-CM | POA: Diagnosis not present

## 2020-05-03 DIAGNOSIS — M9901 Segmental and somatic dysfunction of cervical region: Secondary | ICD-10-CM | POA: Diagnosis not present

## 2020-05-03 DIAGNOSIS — M5442 Lumbago with sciatica, left side: Secondary | ICD-10-CM | POA: Diagnosis not present

## 2020-05-03 DIAGNOSIS — M546 Pain in thoracic spine: Secondary | ICD-10-CM | POA: Diagnosis not present

## 2020-05-12 DIAGNOSIS — M5442 Lumbago with sciatica, left side: Secondary | ICD-10-CM | POA: Diagnosis not present

## 2020-05-12 DIAGNOSIS — M9905 Segmental and somatic dysfunction of pelvic region: Secondary | ICD-10-CM | POA: Diagnosis not present

## 2020-05-12 DIAGNOSIS — M9901 Segmental and somatic dysfunction of cervical region: Secondary | ICD-10-CM | POA: Diagnosis not present

## 2020-05-12 DIAGNOSIS — M9902 Segmental and somatic dysfunction of thoracic region: Secondary | ICD-10-CM | POA: Diagnosis not present

## 2020-05-12 DIAGNOSIS — M5413 Radiculopathy, cervicothoracic region: Secondary | ICD-10-CM | POA: Diagnosis not present

## 2020-05-12 DIAGNOSIS — M9903 Segmental and somatic dysfunction of lumbar region: Secondary | ICD-10-CM | POA: Diagnosis not present

## 2020-05-12 DIAGNOSIS — M546 Pain in thoracic spine: Secondary | ICD-10-CM | POA: Diagnosis not present

## 2020-05-13 DIAGNOSIS — M7552 Bursitis of left shoulder: Secondary | ICD-10-CM | POA: Diagnosis not present

## 2020-05-13 DIAGNOSIS — M7582 Other shoulder lesions, left shoulder: Secondary | ICD-10-CM | POA: Diagnosis not present

## 2020-05-25 DIAGNOSIS — M5417 Radiculopathy, lumbosacral region: Secondary | ICD-10-CM | POA: Diagnosis not present

## 2020-06-09 DIAGNOSIS — M7582 Other shoulder lesions, left shoulder: Secondary | ICD-10-CM | POA: Diagnosis not present

## 2020-06-09 DIAGNOSIS — M7552 Bursitis of left shoulder: Secondary | ICD-10-CM | POA: Diagnosis not present

## 2020-06-21 ENCOUNTER — Other Ambulatory Visit: Payer: Self-pay | Admitting: Neurosurgery

## 2020-06-21 DIAGNOSIS — M48061 Spinal stenosis, lumbar region without neurogenic claudication: Secondary | ICD-10-CM | POA: Diagnosis not present

## 2020-06-21 DIAGNOSIS — M5416 Radiculopathy, lumbar region: Secondary | ICD-10-CM | POA: Diagnosis not present

## 2020-06-27 ENCOUNTER — Ambulatory Visit
Admission: RE | Admit: 2020-06-27 | Discharge: 2020-06-27 | Disposition: A | Discharge status: Home / Self Care | Payer: Medicare Other | Source: Ambulatory Visit | Attending: Neurosurgery | Admitting: Neurosurgery

## 2020-06-27 ENCOUNTER — Other Ambulatory Visit: Payer: Medicare Other

## 2020-06-27 ENCOUNTER — Other Ambulatory Visit: Payer: Self-pay

## 2020-06-27 DIAGNOSIS — M48061 Spinal stenosis, lumbar region without neurogenic claudication: Secondary | ICD-10-CM | POA: Diagnosis not present

## 2020-06-27 DIAGNOSIS — M545 Low back pain, unspecified: Secondary | ICD-10-CM | POA: Diagnosis not present

## 2020-06-30 DIAGNOSIS — M5416 Radiculopathy, lumbar region: Secondary | ICD-10-CM | POA: Diagnosis not present

## 2020-06-30 DIAGNOSIS — M48061 Spinal stenosis, lumbar region without neurogenic claudication: Secondary | ICD-10-CM | POA: Diagnosis not present

## 2020-07-09 DIAGNOSIS — M9903 Segmental and somatic dysfunction of lumbar region: Secondary | ICD-10-CM | POA: Diagnosis not present

## 2020-07-09 DIAGNOSIS — M5413 Radiculopathy, cervicothoracic region: Secondary | ICD-10-CM | POA: Diagnosis not present

## 2020-07-09 DIAGNOSIS — M546 Pain in thoracic spine: Secondary | ICD-10-CM | POA: Diagnosis not present

## 2020-07-09 DIAGNOSIS — M9901 Segmental and somatic dysfunction of cervical region: Secondary | ICD-10-CM | POA: Diagnosis not present

## 2020-07-09 DIAGNOSIS — M9902 Segmental and somatic dysfunction of thoracic region: Secondary | ICD-10-CM | POA: Diagnosis not present

## 2020-07-09 DIAGNOSIS — M9905 Segmental and somatic dysfunction of pelvic region: Secondary | ICD-10-CM | POA: Diagnosis not present

## 2020-07-09 DIAGNOSIS — M5136 Other intervertebral disc degeneration, lumbar region: Secondary | ICD-10-CM | POA: Diagnosis not present

## 2020-07-16 DIAGNOSIS — M5413 Radiculopathy, cervicothoracic region: Secondary | ICD-10-CM | POA: Diagnosis not present

## 2020-07-16 DIAGNOSIS — M546 Pain in thoracic spine: Secondary | ICD-10-CM | POA: Diagnosis not present

## 2020-07-16 DIAGNOSIS — M9901 Segmental and somatic dysfunction of cervical region: Secondary | ICD-10-CM | POA: Diagnosis not present

## 2020-07-16 DIAGNOSIS — M9903 Segmental and somatic dysfunction of lumbar region: Secondary | ICD-10-CM | POA: Diagnosis not present

## 2020-07-16 DIAGNOSIS — M5136 Other intervertebral disc degeneration, lumbar region: Secondary | ICD-10-CM | POA: Diagnosis not present

## 2020-07-16 DIAGNOSIS — M9905 Segmental and somatic dysfunction of pelvic region: Secondary | ICD-10-CM | POA: Diagnosis not present

## 2020-07-16 DIAGNOSIS — M9902 Segmental and somatic dysfunction of thoracic region: Secondary | ICD-10-CM | POA: Diagnosis not present

## 2020-07-19 DIAGNOSIS — R030 Elevated blood-pressure reading, without diagnosis of hypertension: Secondary | ICD-10-CM | POA: Diagnosis not present

## 2020-07-19 DIAGNOSIS — Z6828 Body mass index (BMI) 28.0-28.9, adult: Secondary | ICD-10-CM | POA: Diagnosis not present

## 2020-07-19 DIAGNOSIS — M4714 Other spondylosis with myelopathy, thoracic region: Secondary | ICD-10-CM | POA: Diagnosis not present

## 2020-07-19 DIAGNOSIS — M5416 Radiculopathy, lumbar region: Secondary | ICD-10-CM | POA: Diagnosis not present

## 2020-07-21 ENCOUNTER — Other Ambulatory Visit: Payer: Self-pay | Admitting: Neurosurgery

## 2020-07-21 DIAGNOSIS — M4714 Other spondylosis with myelopathy, thoracic region: Secondary | ICD-10-CM

## 2020-07-24 ENCOUNTER — Other Ambulatory Visit: Payer: Self-pay

## 2020-07-24 ENCOUNTER — Ambulatory Visit
Admission: RE | Admit: 2020-07-24 | Discharge: 2020-07-24 | Disposition: A | Discharge status: Home / Self Care | Payer: Medicare Other | Source: Ambulatory Visit | Attending: Neurosurgery | Admitting: Neurosurgery

## 2020-07-24 DIAGNOSIS — R200 Anesthesia of skin: Secondary | ICD-10-CM | POA: Diagnosis not present

## 2020-07-24 DIAGNOSIS — R531 Weakness: Secondary | ICD-10-CM | POA: Diagnosis not present

## 2020-07-24 DIAGNOSIS — M4714 Other spondylosis with myelopathy, thoracic region: Secondary | ICD-10-CM

## 2020-07-24 DIAGNOSIS — M48061 Spinal stenosis, lumbar region without neurogenic claudication: Secondary | ICD-10-CM | POA: Diagnosis not present

## 2020-07-24 DIAGNOSIS — G8220 Paraplegia, unspecified: Secondary | ICD-10-CM | POA: Diagnosis not present

## 2020-07-27 DIAGNOSIS — Z6828 Body mass index (BMI) 28.0-28.9, adult: Secondary | ICD-10-CM | POA: Diagnosis not present

## 2020-07-27 DIAGNOSIS — M4714 Other spondylosis with myelopathy, thoracic region: Secondary | ICD-10-CM | POA: Diagnosis not present

## 2020-07-27 DIAGNOSIS — R030 Elevated blood-pressure reading, without diagnosis of hypertension: Secondary | ICD-10-CM | POA: Diagnosis not present

## 2020-07-29 ENCOUNTER — Other Ambulatory Visit: Payer: Self-pay | Admitting: Neurosurgery

## 2020-07-29 DIAGNOSIS — L811 Chloasma: Secondary | ICD-10-CM | POA: Diagnosis not present

## 2020-07-29 DIAGNOSIS — L820 Inflamed seborrheic keratosis: Secondary | ICD-10-CM | POA: Diagnosis not present

## 2020-08-03 NOTE — Pre-Procedure Instructions (Signed)
Surgical Instructions    Your procedure is scheduled on Thursday, May 26th.  Report to [facility] Main Entrance "A" at 10:30 A.M., then check in with the Admitting office.  Call this number if you have problems the morning of surgery:  [phone]   If you have any questions prior to your surgery date call [phone]: Open Monday-Friday 8am-4pm    Remember:  Do not eat or drink after midnight the night before your surgery   There are no medications that you need to take the morning of surgery.   As of today, STOP taking any Aspirin (unless otherwise instructed by your surgeon) Aleve, Naproxen, Ibuprofen, Motrin, Advil, Goody's, BC's, all herbal medications, fish oil, and all vitamins. This includes: diclofenac Sodium (VOLTAREN) 1 % GEL.                    Do NOT Smoke (Tobacco/Vaping) or drink Alcohol 24 hours prior to your procedure.  If you use a CPAP at night, you may bring all equipment for your overnight stay.   Contacts, glasses, piercing's, hearing aid's, dentures or partials may not be worn into surgery, please bring cases for these belongings.    For patients admitted to the hospital, discharge time will be determined by your treatment team.   Patients discharged the day of surgery will not be allowed to drive home, and someone needs to stay with them for 24 hours.    Special instructions:   [facility]- Preparing For Surgery  Before surgery, you can play an important role. Because skin is not sterile, your skin needs to be as free of germs as possible. You can reduce the number of germs on your skin by washing with CHG (chlorahexidine gluconate) Soap before surgery.  CHG is an antiseptic cleaner which kills germs and bonds with the skin to continue killing germs even after washing.    Oral Hygiene is also important to reduce your risk of infection.  Remember - BRUSH YOUR TEETH THE MORNING OF SURGERY WITH YOUR REGULAR TOOTHPASTE  Please do not use if you have an  allergy to CHG or antibacterial soaps. If your skin becomes reddened/irritated stop using the CHG.  Do not shave (including legs and underarms) for at least 48 hours prior to first CHG shower. It is OK to shave your face.  Please follow these instructions carefully.   1. Shower the NIGHT BEFORE SURGERY and the MORNING OF SURGERY  2. If you chose to wash your hair, wash your hair first as usual with your normal shampoo.  3. After you shampoo, rinse your hair and body thoroughly to remove the shampoo.  4. Use CHG Soap as you would any other liquid soap. You can apply CHG directly to the skin and wash gently with a scrungie or a clean washcloth.   5. Apply the CHG Soap to your body ONLY FROM THE NECK DOWN.  Do not use on open wounds or open sores. Avoid contact with your eyes, ears, mouth and genitals (private parts). Wash Face and genitals (private parts)  with your normal soap.   6. Wash thoroughly, paying special attention to the area where your surgery will be performed.  7. Thoroughly rinse your body with warm water from the neck down.  8. DO NOT shower/wash with your normal soap after using and rinsing off the CHG Soap.  9. Pat yourself dry with a CLEAN TOWEL.  10. Wear CLEAN PAJAMAS to bed the night before surgery  11.  Place CLEAN SHEETS on your bed the night before your surgery  12. DO NOT SLEEP WITH PETS.   Day of Surgery: Shower with CHG soap. Do not wear jewelry, make up, or nail polish Do not wear lotions, powders, perfumes, or deodorant. Do not shave 48 hours prior to surgery.  Do not bring valuables to the hospital. [facility] is not responsible for any belongings or valuables. Wear Clean/Comfortable clothing the morning of surgery Remember to brush your teeth WITH YOUR REGULAR TOOTHPASTE.   Please read over the following fact sheets that you were given.

## 2020-08-04 ENCOUNTER — Encounter (HOSPITAL_COMMUNITY)
Admission: RE | Admit: 2020-08-04 | Discharge: 2020-08-04 | Disposition: A | Discharge status: Home / Self Care | Payer: Medicare Other | Source: Ambulatory Visit | Attending: Neurosurgery | Admitting: Neurosurgery

## 2020-08-04 ENCOUNTER — Other Ambulatory Visit: Payer: Self-pay

## 2020-08-04 ENCOUNTER — Encounter (HOSPITAL_COMMUNITY): Payer: Self-pay

## 2020-08-04 DIAGNOSIS — Z20822 Contact with and (suspected) exposure to covid-19: Secondary | ICD-10-CM | POA: Diagnosis not present

## 2020-08-04 DIAGNOSIS — Z01818 Encounter for other preprocedural examination: Secondary | ICD-10-CM | POA: Insufficient documentation

## 2020-08-04 HISTORY — DX: Gastro-esophageal reflux disease without esophagitis: K21.9

## 2020-08-04 HISTORY — DX: Cardiac murmur, unspecified: R01.1

## 2020-08-04 LAB — CBC
HCT: 42.1 % (ref 36.0–46.0)
Hemoglobin: 14 g/dL (ref 12.0–15.0)
MCH: 31.2 pg (ref 26.0–34.0)
MCHC: 33.3 g/dL (ref 30.0–36.0)
MCV: 93.8 fL (ref 80.0–100.0)
Platelets: 236 10*3/uL (ref 150–400)
RBC: 4.49 MIL/uL (ref 3.87–5.11)
RDW: 12.2 % (ref 11.5–15.5)
WBC: 5.4 10*3/uL (ref 4.0–10.5)
nRBC: 0 % (ref 0.0–0.2)

## 2020-08-04 LAB — BASIC METABOLIC PANEL
Anion gap: 5 (ref 5–15)
BUN: 9 mg/dL (ref 8–23)
CO2: 25 mmol/L (ref 22–32)
Calcium: 8.7 mg/dL — ABNORMAL LOW (ref 8.9–10.3)
Chloride: 106 mmol/L (ref 98–111)
Creatinine, Ser: 0.65 mg/dL (ref 0.44–1.00)
GFR, Estimated: >60 mL/min (ref >60)
Glucose, Bld: 98 mg/dL (ref 70–99)
Potassium: 3.7 mmol/L (ref 3.5–5.1)
Sodium: 136 mmol/L (ref 135–145)

## 2020-08-04 LAB — SARS CORONAVIRUS 2 (TAT 6-24 HRS): SARS Coronavirus 2: NEGATIVE

## 2020-08-04 LAB — SURGICAL PCR SCREEN
MRSA, PCR: NEGATIVE
Staphylococcus aureus: NEGATIVE

## 2020-08-04 NOTE — Progress Notes (Addendum)
PCP - Nita Sells Gastroenterologist: Lionel December Cardiologist - denies  PPM/ICD - denies   Chest x-ray - n/a EKG - 08/04/20 Stress Test - denies ECHO - denies Cardiac Cath - denies  Sleep Study - denies  No diabetes  Patient instructed to hold all Aspirin, NSAID's, herbal medications, fish oil and vitamins 7 days prior to surgery.   ERAS Protcol -no  COVID TEST-yes, scheduled as outpatient in bed (08/04/20)    Anesthesia review: no  Patient denies shortness of breath, fever, cough and chest pain at PAT appointment   All instructions explained to the patient, with a verbal understanding of the material. Patient agrees to go over the instructions while at home for a better understanding. Patient also instructed to self quarantine after being tested for COVID-19. The opportunity to ask questions was provided.

## 2020-08-05 ENCOUNTER — Observation Stay (HOSPITAL_COMMUNITY)
Admission: RE | Admit: 2020-08-05 | Discharge: 2020-08-06 | Disposition: A | Discharge status: Home / Self Care | Payer: Medicare Other | Attending: Neurosurgery | Admitting: Neurosurgery

## 2020-08-05 ENCOUNTER — Encounter (HOSPITAL_COMMUNITY)
Admission: RE | Disposition: A | Discharge status: Home / Self Care | Payer: Self-pay | Source: Home / Self Care | Attending: Neurosurgery

## 2020-08-05 ENCOUNTER — Ambulatory Visit (HOSPITAL_COMMUNITY): Payer: Medicare Other | Admitting: Certified Registered"

## 2020-08-05 ENCOUNTER — Encounter (HOSPITAL_COMMUNITY): Payer: Self-pay | Admitting: Neurosurgery

## 2020-08-05 ENCOUNTER — Ambulatory Visit (HOSPITAL_COMMUNITY): Payer: Medicare Other

## 2020-08-05 DIAGNOSIS — M4804 Spinal stenosis, thoracic region: Secondary | ICD-10-CM | POA: Diagnosis not present

## 2020-08-05 DIAGNOSIS — E7800 Pure hypercholesterolemia, unspecified: Secondary | ICD-10-CM | POA: Diagnosis not present

## 2020-08-05 DIAGNOSIS — I10 Essential (primary) hypertension: Secondary | ICD-10-CM | POA: Diagnosis not present

## 2020-08-05 DIAGNOSIS — Z981 Arthrodesis status: Secondary | ICD-10-CM | POA: Diagnosis not present

## 2020-08-05 DIAGNOSIS — M4714 Other spondylosis with myelopathy, thoracic region: Secondary | ICD-10-CM | POA: Diagnosis not present

## 2020-08-05 DIAGNOSIS — Z96643 Presence of artificial hip joint, bilateral: Secondary | ICD-10-CM | POA: Diagnosis not present

## 2020-08-05 DIAGNOSIS — Z79899 Other long term (current) drug therapy: Secondary | ICD-10-CM | POA: Insufficient documentation

## 2020-08-05 DIAGNOSIS — E876 Hypokalemia: Secondary | ICD-10-CM | POA: Diagnosis not present

## 2020-08-05 DIAGNOSIS — Z419 Encounter for procedure for purposes other than remedying health state, unspecified: Secondary | ICD-10-CM

## 2020-08-05 HISTORY — PX: LUMBAR LAMINECTOMY/DECOMPRESSION MICRODISCECTOMY: SHX5026

## 2020-08-05 SURGERY — LUMBAR LAMINECTOMY/DECOMPRESSION MICRODISCECTOMY
Anesthesia: General | Site: Back

## 2020-08-05 MED ORDER — CHLORHEXIDINE GLUCONATE 0.12 % MT SOLN
15.0000 mL | Freq: Once | OROMUCOSAL | Status: AC
  Administered 2020-08-05: 15 mL via OROMUCOSAL
  Filled 2020-08-05: qty 15

## 2020-08-05 MED ORDER — ESCITALOPRAM OXALATE 10 MG PO TABS
10.0000 mg | ORAL_TABLET | Freq: Every day | ORAL | Status: DC
  Administered 2020-08-05: 10 mg via ORAL
  Filled 2020-08-05 (×2): qty 1

## 2020-08-05 MED ORDER — EPHEDRINE SULFATE 50 MG/ML IJ SOLN
INTRAMUSCULAR | Status: DC | PRN
  Administered 2020-08-05: 5 mg via INTRAVENOUS
  Administered 2020-08-05: 10 mg via INTRAVENOUS
  Administered 2020-08-05: 5 mg via INTRAVENOUS

## 2020-08-05 MED ORDER — ROCURONIUM BROMIDE 10 MG/ML (PF) SYRINGE
PREFILLED_SYRINGE | INTRAVENOUS | Status: AC
  Filled 2020-08-05: qty 20

## 2020-08-05 MED ORDER — MEPERIDINE HCL 25 MG/ML IJ SOLN: 6.2500 mg | INTRAMUSCULAR | Status: DC | PRN

## 2020-08-05 MED ORDER — ACETAMINOPHEN 325 MG PO TABS: 650.0000 mg | ORAL_TABLET | ORAL | Status: DC | PRN

## 2020-08-05 MED ORDER — THROMBIN 5000 UNITS EX SOLR
CUTANEOUS | Status: DC | PRN
  Administered 2020-08-05 (×2): 5000 [IU] via TOPICAL

## 2020-08-05 MED ORDER — FENTANYL CITRATE (PF) 250 MCG/5ML IJ SOLN
INTRAMUSCULAR | Status: DC | PRN
  Administered 2020-08-05 (×2): 50 ug via INTRAVENOUS
  Administered 2020-08-05: 25 ug via INTRAVENOUS
  Administered 2020-08-05: 50 ug via INTRAVENOUS
  Administered 2020-08-05: 25 ug via INTRAVENOUS
  Administered 2020-08-05: 50 ug via INTRAVENOUS

## 2020-08-05 MED ORDER — LIDOCAINE-EPINEPHRINE 1 %-1:100000 IJ SOLN
INTRAMUSCULAR | Status: AC
  Filled 2020-08-05: qty 1

## 2020-08-05 MED ORDER — MIDAZOLAM HCL 5 MG/5ML IJ SOLN
INTRAMUSCULAR | Status: DC | PRN
  Administered 2020-08-05: 2 mg via INTRAVENOUS

## 2020-08-05 MED ORDER — ONDANSETRON HCL 4 MG/2ML IJ SOLN: 4.0000 mg | Freq: Four times a day (QID) | INTRAMUSCULAR | Status: DC | PRN

## 2020-08-05 MED ORDER — BUPIVACAINE-EPINEPHRINE 0.5% -1:200000 IJ SOLN
INTRAMUSCULAR | Status: DC | PRN
  Administered 2020-08-05: 30 mL

## 2020-08-05 MED ORDER — DEXAMETHASONE SODIUM PHOSPHATE 10 MG/ML IJ SOLN
INTRAMUSCULAR | Status: AC
  Filled 2020-08-05: qty 2

## 2020-08-05 MED ORDER — ATORVASTATIN CALCIUM 80 MG PO TABS
80.0000 mg | ORAL_TABLET | Freq: Every day | ORAL | Status: DC
  Administered 2020-08-05: 80 mg via ORAL
  Filled 2020-08-05: qty 1

## 2020-08-05 MED ORDER — CHLORHEXIDINE GLUCONATE CLOTH 2 % EX PADS: 6.0000 | MEDICATED_PAD | Freq: Once | CUTANEOUS | Status: DC

## 2020-08-05 MED ORDER — MENTHOL 3 MG MT LOZG: 1.0000 | LOZENGE | OROMUCOSAL | Status: DC | PRN

## 2020-08-05 MED ORDER — DIAZEPAM 5 MG PO TABS
5.0000 mg | ORAL_TABLET | Freq: Four times a day (QID) | ORAL | Status: DC | PRN
  Administered 2020-08-05: 5 mg via ORAL
  Filled 2020-08-05: qty 1

## 2020-08-05 MED ORDER — EPHEDRINE 5 MG/ML INJ
INTRAVENOUS | Status: AC
  Filled 2020-08-05: qty 20

## 2020-08-05 MED ORDER — HYDRALAZINE HCL 20 MG/ML IJ SOLN
INTRAMUSCULAR | Status: AC
  Filled 2020-08-05: qty 1

## 2020-08-05 MED ORDER — LORAZEPAM 0.5 MG PO TABS
2.0000 mg | ORAL_TABLET | Freq: Every day | ORAL | Status: DC
  Administered 2020-08-05: 2 mg via ORAL
  Filled 2020-08-05: qty 4

## 2020-08-05 MED ORDER — DOCUSATE SODIUM 100 MG PO CAPS
100.0000 mg | ORAL_CAPSULE | Freq: Two times a day (BID) | ORAL | Status: DC
  Administered 2020-08-05 – 2020-08-06 (×2): 100 mg via ORAL
  Filled 2020-08-05 (×2): qty 1

## 2020-08-05 MED ORDER — ONDANSETRON HCL 4 MG PO TABS: 4.0000 mg | ORAL_TABLET | Freq: Four times a day (QID) | ORAL | Status: DC | PRN

## 2020-08-05 MED ORDER — FENTANYL CITRATE (PF) 250 MCG/5ML IJ SOLN
INTRAMUSCULAR | Status: AC
  Filled 2020-08-05: qty 5

## 2020-08-05 MED ORDER — SENNOSIDES-DOCUSATE SODIUM 8.6-50 MG PO TABS: 1.0000 | ORAL_TABLET | Freq: Every evening | ORAL | Status: DC | PRN

## 2020-08-05 MED ORDER — CEFAZOLIN SODIUM-DEXTROSE 2-4 GM/100ML-% IV SOLN
2.0000 g | INTRAVENOUS | Status: AC
Start: 2020-08-11 — End: 2020-08-05
  Administered 2020-08-05: 2 g via INTRAVENOUS

## 2020-08-05 MED ORDER — LIDOCAINE 2% (20 MG/ML) 5 ML SYRINGE
INTRAMUSCULAR | Status: DC | PRN
  Administered 2020-08-05: 60 mg via INTRAVENOUS

## 2020-08-05 MED ORDER — LACTATED RINGERS IV SOLN: INTRAVENOUS | Status: DC

## 2020-08-05 MED ORDER — EZETIMIBE 10 MG PO TABS
10.0000 mg | ORAL_TABLET | Freq: Every day | ORAL | Status: DC
  Administered 2020-08-05: 10 mg via ORAL
  Filled 2020-08-05 (×2): qty 1

## 2020-08-05 MED ORDER — ARTIFICIAL TEARS OPHTHALMIC OINT
TOPICAL_OINTMENT | OPHTHALMIC | Status: DC | PRN
  Administered 2020-08-05: 1 via OPHTHALMIC

## 2020-08-05 MED ORDER — OXYCODONE HCL 5 MG/5ML PO SOLN: 5.0000 mg | Freq: Once | ORAL | Status: DC | PRN

## 2020-08-05 MED ORDER — ONDANSETRON HCL 4 MG/2ML IJ SOLN
INTRAMUSCULAR | Status: DC | PRN
  Administered 2020-08-05: 4 mg via INTRAVENOUS

## 2020-08-05 MED ORDER — PROMETHAZINE HCL 25 MG/ML IJ SOLN: 6.2500 mg | INTRAMUSCULAR | Status: DC | PRN

## 2020-08-05 MED ORDER — LIDOCAINE 2% (20 MG/ML) 5 ML SYRINGE
INTRAMUSCULAR | Status: AC
  Filled 2020-08-05: qty 10

## 2020-08-05 MED ORDER — ZOLPIDEM TARTRATE 5 MG PO TABS: 5.0000 mg | ORAL_TABLET | Freq: Every evening | ORAL | Status: DC | PRN

## 2020-08-05 MED ORDER — CEFAZOLIN SODIUM-DEXTROSE 2-4 GM/100ML-% IV SOLN
INTRAVENOUS | Status: AC
  Filled 2020-08-05: qty 100

## 2020-08-05 MED ORDER — ESMOLOL HCL 100 MG/10ML IV SOLN
INTRAVENOUS | Status: DC | PRN
  Administered 2020-08-05: 20 mg via INTRAVENOUS
  Administered 2020-08-05: 30 mg via INTRAVENOUS
  Administered 2020-08-05: 20 mg via INTRAVENOUS

## 2020-08-05 MED ORDER — SODIUM CHLORIDE 0.9% FLUSH: 3.0000 mL | INTRAVENOUS | Status: DC | PRN

## 2020-08-05 MED ORDER — DEXAMETHASONE SODIUM PHOSPHATE 10 MG/ML IJ SOLN
INTRAMUSCULAR | Status: DC | PRN
  Administered 2020-08-05: 5 mg via INTRAVENOUS

## 2020-08-05 MED ORDER — FENTANYL CITRATE (PF) 100 MCG/2ML IJ SOLN
INTRAMUSCULAR | Status: AC
  Filled 2020-08-05: qty 2

## 2020-08-05 MED ORDER — ACETAMINOPHEN 650 MG RE SUPP: 650.0000 mg | RECTAL | Status: DC | PRN

## 2020-08-05 MED ORDER — PHENOL 1.4 % MT LIQD: 1.0000 | OROMUCOSAL | Status: DC | PRN

## 2020-08-05 MED ORDER — MORPHINE SULFATE (PF) 2 MG/ML IV SOLN
1.0000 mg | INTRAVENOUS | Status: DC | PRN
  Filled 2020-08-05: qty 1

## 2020-08-05 MED ORDER — BUPIVACAINE HCL (PF) 0.5 % IJ SOLN
INTRAMUSCULAR | Status: AC
  Filled 2020-08-05: qty 30

## 2020-08-05 MED ORDER — ROCURONIUM BROMIDE 10 MG/ML (PF) SYRINGE
PREFILLED_SYRINGE | INTRAVENOUS | Status: DC | PRN
  Administered 2020-08-05: 100 mg via INTRAVENOUS

## 2020-08-05 MED ORDER — 0.9 % SODIUM CHLORIDE (POUR BTL) OPTIME
TOPICAL | Status: DC | PRN
  Administered 2020-08-05: 1000 mL

## 2020-08-05 MED ORDER — SUCCINYLCHOLINE CHLORIDE 200 MG/10ML IV SOSY
PREFILLED_SYRINGE | INTRAVENOUS | Status: AC
  Filled 2020-08-05: qty 10

## 2020-08-05 MED ORDER — HYDRALAZINE HCL 20 MG/ML IJ SOLN
5.0000 mg | INTRAMUSCULAR | Status: DC | PRN
  Administered 2020-08-05 (×2): 5 mg via INTRAVENOUS

## 2020-08-05 MED ORDER — MIDAZOLAM HCL 2 MG/2ML IJ SOLN
INTRAMUSCULAR | Status: AC
  Filled 2020-08-05: qty 2

## 2020-08-05 MED ORDER — SODIUM CHLORIDE 0.9% FLUSH: 3.0000 mL | Freq: Two times a day (BID) | INTRAVENOUS | Status: DC

## 2020-08-05 MED ORDER — ACETAMINOPHEN 500 MG PO TABS
1000.0000 mg | ORAL_TABLET | Freq: Four times a day (QID) | ORAL | Status: DC
  Administered 2020-08-05: 1000 mg via ORAL
  Filled 2020-08-05: qty 2

## 2020-08-05 MED ORDER — ORAL CARE MOUTH RINSE: 15.0000 mL | Freq: Once | OROMUCOSAL | Status: AC

## 2020-08-05 MED ORDER — HYDROMORPHONE HCL 1 MG/ML IJ SOLN
0.2500 mg | INTRAMUSCULAR | Status: DC | PRN
  Administered 2020-08-05 (×3): 0.25 mg via INTRAVENOUS

## 2020-08-05 MED ORDER — SODIUM CHLORIDE 0.9 % IV SOLN: 250.0000 mL | INTRAVENOUS | Status: DC

## 2020-08-05 MED ORDER — BUPIVACAINE-EPINEPHRINE 0.5% -1:200000 IJ SOLN
INTRAMUSCULAR | Status: AC
  Filled 2020-08-05: qty 1

## 2020-08-05 MED ORDER — PHENYLEPHRINE 40 MCG/ML (10ML) SYRINGE FOR IV PUSH (FOR BLOOD PRESSURE SUPPORT)
PREFILLED_SYRINGE | INTRAVENOUS | Status: AC
  Filled 2020-08-05: qty 10

## 2020-08-05 MED ORDER — ONDANSETRON HCL 4 MG/2ML IJ SOLN
INTRAMUSCULAR | Status: AC
  Filled 2020-08-05: qty 4

## 2020-08-05 MED ORDER — HYDROMORPHONE HCL 1 MG/ML IJ SOLN
INTRAMUSCULAR | Status: AC
  Filled 2020-08-05: qty 1

## 2020-08-05 MED ORDER — PROPOFOL 10 MG/ML IV BOLUS
INTRAVENOUS | Status: DC | PRN
  Administered 2020-08-05: 150 mg via INTRAVENOUS

## 2020-08-05 MED ORDER — SUGAMMADEX SODIUM 200 MG/2ML IV SOLN
INTRAVENOUS | Status: DC | PRN
  Administered 2020-08-05: 250 mg via INTRAVENOUS

## 2020-08-05 MED ORDER — HYDROCODONE-ACETAMINOPHEN 5-325 MG PO TABS
1.0000 | ORAL_TABLET | ORAL | Status: DC | PRN
  Administered 2020-08-05 – 2020-08-06 (×4): 2 via ORAL
  Filled 2020-08-05 (×4): qty 2

## 2020-08-05 MED ORDER — POTASSIUM CHLORIDE IN NACL 20-0.9 MEQ/L-% IV SOLN: INTRAVENOUS | Status: DC

## 2020-08-05 MED ORDER — THROMBIN 5000 UNITS EX SOLR
CUTANEOUS | Status: AC
  Filled 2020-08-05: qty 10000

## 2020-08-05 MED ORDER — HYDRALAZINE HCL 20 MG/ML IJ SOLN: 5.0000 mg | INTRAMUSCULAR | Status: DC

## 2020-08-05 MED ORDER — HEMOSTATIC AGENTS (NO CHARGE) OPTIME
TOPICAL | Status: DC | PRN
  Administered 2020-08-05: 1 via TOPICAL

## 2020-08-05 MED ORDER — OXYCODONE HCL 5 MG PO TABS
5.0000 mg | ORAL_TABLET | Freq: Once | ORAL | Status: DC | PRN
Start: 2020-08-05 — End: 2020-08-05

## 2020-08-05 MED ORDER — OXYCODONE HCL 5 MG PO TABS: 5.0000 mg | ORAL_TABLET | ORAL | Status: DC | PRN

## 2020-08-05 MED ORDER — OXYCODONE HCL 5 MG PO TABS
10.0000 mg | ORAL_TABLET | ORAL | Status: DC | PRN
  Administered 2020-08-05: 10 mg via ORAL
  Filled 2020-08-05: qty 2

## 2020-08-05 SURGICAL SUPPLY — 48 items
BAND RUBBER #18 3X1/16 STRL (MISCELLANEOUS) ×6 IMPLANT
BENZOIN TINCTURE PRP APPL 2/3 (GAUZE/BANDAGES/DRESSINGS) IMPLANT
BLADE CLIPPER SURG (BLADE) IMPLANT
BUR MATCHSTICK NEURO 3.0 LAGG (BURR) ×3 IMPLANT
BUR PRECISION FLUTE 5.0 (BURR) IMPLANT
CANISTER SUCT 3000ML PPV (MISCELLANEOUS) ×3 IMPLANT
CARTRIDGE OIL MAESTRO DRILL (MISCELLANEOUS) ×2 IMPLANT
CORD BIPOLAR FORCEPS 12FT (ELECTRODE) ×3 IMPLANT
COVER WAND RF STERILE (DRAPES) IMPLANT
DECANTER SPIKE VIAL GLASS SM (MISCELLANEOUS) ×3 IMPLANT
DERMABOND ADVANCED (GAUZE/BANDAGES/DRESSINGS) ×1
DERMABOND ADVANCED .7 DNX12 (GAUZE/BANDAGES/DRESSINGS) ×2 IMPLANT
DIFFUSER DRILL AIR PNEUMATIC (MISCELLANEOUS) ×3 IMPLANT
DRAPE INCISE IOBAN 66X45 STRL (DRAPES) ×3 IMPLANT
DRAPE LAPAROTOMY 100X72X124 (DRAPES) ×6 IMPLANT
DRAPE MICROSCOPE LEICA (MISCELLANEOUS) ×3 IMPLANT
DRAPE SURG 17X23 STRL (DRAPES) ×3 IMPLANT
DURAPREP 26ML APPLICATOR (WOUND CARE) ×6 IMPLANT
ELECT REM PT RETURN 9FT ADLT (ELECTROSURGICAL) ×3
ELECTRODE REM PT RTRN 9FT ADLT (ELECTROSURGICAL) ×2 IMPLANT
GAUZE 4X4 16PLY RFD (DISPOSABLE) IMPLANT
GAUZE SPONGE 4X4 12PLY STRL (GAUZE/BANDAGES/DRESSINGS) IMPLANT
GLOVE ECLIPSE 6.5 STRL STRAW (GLOVE) ×3 IMPLANT
GLOVE EXAM NITRILE XL STR (GLOVE) IMPLANT
GOWN STRL REUS W/ TWL LRG LVL3 (GOWN DISPOSABLE) ×4 IMPLANT
GOWN STRL REUS W/ TWL XL LVL3 (GOWN DISPOSABLE) IMPLANT
GOWN STRL REUS W/TWL 2XL LVL3 (GOWN DISPOSABLE) IMPLANT
GOWN STRL REUS W/TWL LRG LVL3 (GOWN DISPOSABLE) ×6
GOWN STRL REUS W/TWL XL LVL3 (GOWN DISPOSABLE)
KIT BASIN OR (CUSTOM PROCEDURE TRAY) ×3 IMPLANT
KIT TURNOVER KIT B (KITS) ×3 IMPLANT
NEEDLE HYPO 25X1 1.5 SAFETY (NEEDLE) ×3 IMPLANT
NEEDLE SPNL 18GX3.5 QUINCKE PK (NEEDLE) IMPLANT
NS IRRIG 1000ML POUR BTL (IV SOLUTION) ×3 IMPLANT
OIL CARTRIDGE MAESTRO DRILL (MISCELLANEOUS) ×3
PACK LAMINECTOMY NEURO (CUSTOM PROCEDURE TRAY) ×3 IMPLANT
PAD ARMBOARD 7.5X6 YLW CONV (MISCELLANEOUS) ×9 IMPLANT
PENCIL BUTTON HOLSTER BLD 10FT (ELECTRODE) ×3 IMPLANT
SPONGE LAP 4X18 RFD (DISPOSABLE) IMPLANT
SPONGE SURGIFOAM ABS GEL SZ50 (HEMOSTASIS) ×3 IMPLANT
STRIP CLOSURE SKIN 1/2X4 (GAUZE/BANDAGES/DRESSINGS) IMPLANT
SUT VIC AB 0 CT1 18XCR BRD8 (SUTURE) ×2 IMPLANT
SUT VIC AB 0 CT1 8-18 (SUTURE) ×3
SUT VIC AB 2-0 CT1 18 (SUTURE) ×3 IMPLANT
SUT VIC AB 3-0 SH 8-18 (SUTURE) ×6 IMPLANT
TOWEL GREEN STERILE (TOWEL DISPOSABLE) ×3 IMPLANT
TOWEL GREEN STERILE FF (TOWEL DISPOSABLE) ×3 IMPLANT
WATER STERILE IRR 1000ML POUR (IV SOLUTION) ×3 IMPLANT

## 2020-08-05 NOTE — Anesthesia Procedure Notes (Signed)
Procedure Name: Intubation Date/Time: 08/05/2020 2:08 PM Performed by: Elliot Dally, CRNA Pre-anesthesia Checklist: Patient identified, Emergency Drugs available, Suction available and Patient being monitored Patient Re-evaluated:Patient Re-evaluated prior to induction Oxygen Delivery Method: Circle System Utilized Preoxygenation: Pre-oxygenation with 100% oxygen Induction Type: IV induction Ventilation: Mask ventilation without difficulty Laryngoscope Size: 2 Grade View: Grade I Tube type: Oral Tube size: 7.0 mm Number of attempts: 1 Airway Equipment and Method: Stylet and Oral airway Placement Confirmation: ETT inserted through vocal cords under direct vision,  positive ETCO2 and breath sounds checked- equal and bilateral Secured at: 21 cm Tube secured with: Tape Dental Injury: Teeth and Oropharynx as per pre-operative assessment

## 2020-08-05 NOTE — Op Note (Signed)
08/05/2020  4:43 PM  PATIENT:  Cynthia Cochran  70 y.o. female  PRE-OPERATIVE DIAGNOSIS:  THORACIC SPONDYLOSIS WITH MYELOPATHY  POST-OPERATIVE DIAGNOSIS:  THORACIC SPONDYLOSIS WITH MYELOPATHY  PROCEDURE:  Procedure(s): LAMINECTOMY THORACIC TEN-ELEVEN  SURGEON: Surgeon(s): Coletta Memos, MD  ASSISTANTS:none  ANESTHESIA:   local and general  EBL:  No intake/output data recorded.  BLOOD ADMINISTERED:none  CELL SAVER GIVEN:none  COUNT:per nursing  DRAINS: none   SPECIMEN:  No Specimen  DICTATION: [name] was taken to the operating room, intubated, and placed under a general anesthetic without difficulty. She was positioned prone on a wilson frame with all pressure points padded. Her back was prepped and draped in a sterile manner. Using fluoroscopy I planned and made the incision in the thoracic region. I exposed the lamina of what was confirmed as T10. I placed a retractor then performed a laminectomy of T10 and partial laminectomy of T11. I decompressed the spinal canal and the spinal cord with the laminectomy. I controlled the bleeding, irrigated, then closed the wound. I used vicryls to approximated the thoracolumbar plane, and subcutaneous, and subcuticular planes. I used Dermabond for a sterile dressing.  PLAN OF CARE: Admit for overnight observation  PATIENT DISPOSITION:  PACU - hemodynamically stable.   Delay start of Pharmacological VTE agent (>24hrs) due to surgical blood loss or risk of bleeding:  no

## 2020-08-05 NOTE — H&P (Signed)
BP (!) 197/80   Pulse (!) 56   Temp 98.1 F (36.7 C) (Oral)   Resp 18   Ht 5\' 2"  (1.575 m)   Wt 69.4 kg   SpO2 100%   BMI 27.98 kg/m  Cynthia Cochran returns today with an MRI done in April 2022 on the 17th, I believe, and what it shows at the very edge of this lumbar scan at T10-11 is cord compression with altered cord signal.  She certainly presents with a picture of thoracic myelopathy.  She has a spastic gait hyperreflexic at the knees and ankles.  She is having problems with bowel movements.  She is having strange sensations in and around her rectum.  It is numb.  She says it feels like she is sitting on a ball.  She does not have coordination.  She has been falling and has multiple bruises on the left leg, both upper extremities, and forearms.  She again presents with a classic picture of thoracic myelopathy.  However, I needed full characterization of the thoracic spine.  This was just simply at the age of the lumbar film.  I will, therefore, order an MRI of the thoracic spine ASAP.  I will also, as I have told her, plan on taking her to the operating room once we get this information back.  She is deteriorating and it seems fairly quickly.  This has been an ongoing problem over the last 2 years, but the walking certainly has progressed significantly since I saw her last in January.  She was not bouncing, nor gave the appearance of being inebriated then. Cynthia Cochran comes in today to go over the MRI results.  She had a lumbar spine MRI that showed something at T10-11, but it was not fully characterized.  Today's scan flushes that out.  She has significant spinal stenosis with cord signal present at T10-11, mainly from posterior elements.  A small disc bulge, but that disc certainly can be left in place, and she certainly can be decompressed adequately.  At T11-12, also a little bit of stenosis mainly coming from the left side posteriorly.  I think she needs a two-level decompression from T10-11 to  T11-12.  She does not need hardware.  She does not need a fusion.  We discussed this at length, explaining the rationale.  She has numbness in the legs.  She has problems with her gait.  She has been falling and I think this is due to the thoracic stenosis and thoracic myelopathy.  Risks and benefits were explained.  She understands. Neuro:  She is alert, oriented by 4, answers all questions appropriately.  Memory, language, attention span, and fund of knowledge are normal. Strength 5/5 in the upper and lower extremities.  Normal muscle tone, bulk, coordination.  Gait is normal.  Speech is clear and fluent. Allergies  Allergen Reactions  . Gabapentin Other (See Comments)    Feet went numb  . Lyrica [Pregabalin]   . Mobic [Meloxicam] Other (See Comments)    Irritates stomach  . Nsaids Other (See Comments)    Irritates stomach - MOBIC     Past Medical History:  Diagnosis Date  . Anxiety   . Arthritis   . Blood transfusion without reported diagnosis 1982   after c section  . Dysphagia 04/25/2016  . Gastric ulcer   . GERD (gastroesophageal reflux disease)   . Heart murmur   . High cholesterol 04/25/2016  . Hyperlipidemia   . Mixed stress and urge  urinary incontinence 09/09/2015  . Sciatic nerve pain   . Spinal stenosis    Past Surgical History:  Procedure Laterality Date  . BIOPSY  05/12/2016   Procedure: BIOPSY;  Surgeon: Malissa Hippo, MD;  [facility]: [facility];  Service: Endoscopy;;  gastric  . BIOPSY  10/04/2018   Procedure: BIOPSY;  Surgeon: Malissa Hippo, MD;  [facility]: [facility];  Service: Endoscopy;;  gastric   . CARPAL TUNNEL RELEASE     right; 1998, left 1998  . CESAREAN SECTION  1982  . COLONOSCOPY WITH PROPOFOL N/A 10/04/2018   Procedure: COLONOSCOPY WITH PROPOFOL;  Surgeon: Malissa Hippo, MD;  Location: [facility];  Service: Endoscopy;  Laterality: N/A;  7:30  . Colonscopy    . ESOPHAGEAL DILATION N/A 05/12/2016   Procedure: ESOPHAGEAL DILATION;  Surgeon:  Malissa Hippo, MD;  [facility]: [facility];  Service: Endoscopy;  Laterality: N/A;  . ESOPHAGOGASTRODUODENOSCOPY N/A 05/12/2016   Procedure: ESOPHAGOGASTRODUODENOSCOPY (EGD);  Surgeon: Malissa Hippo, MD;  [facility]: [facility];  Service: Endoscopy;  Laterality: N/A;  9:30  . ESOPHAGOGASTRODUODENOSCOPY (EGD) WITH PROPOFOL N/A 10/04/2018   Procedure: ESOPHAGOGASTRODUODENOSCOPY (EGD) WITH PROPOFOL;  Surgeon: Malissa Hippo, MD;  Location: [facility];  Service: Endoscopy;  Laterality: N/A;  . EYE SURGERY     lasix  . JOINT REPLACEMENT     both hips  . TOTAL HIP ARTHROPLASTY     right; March 2013; left March 2011   Family History  Problem Relation Age of Onset  . Colon cancer Father 70  . Colon cancer Brother 50  . Other Mother        heavy smoker; died from arterial sclerosis  . Hypertension Sister   . Stomach cancer Neg Hx    Social History   Socioeconomic History  . Marital status: Widowed    Spouse name: Not on file  . Number of children: 0  . Years of education: some college  . Highest education level: Not on file  Occupational History  . Occupation: Retired  Tobacco Use  . Smoking status: Never Smoker  . Smokeless tobacco: Never Used  Vaping Use  . Vaping Use: Never used  Substance and Sexual Activity  . Alcohol use: Yes    Comment: wine once a week  . Drug use: No  . Sexual activity: Not Currently    Birth control/protection: Post-menopausal  Other Topics Concern  . Not on file  Social History Narrative   Lives alone   Caffeine use: no more than 2 cups per day    16 oz soda per day   Right handed    Social Determinants of Health   Financial Resource Strain: Not on file  Food Insecurity: Not on file  Transportation Needs: Not on file  Physical Activity: Not on file  Stress: Not on file  Social Connections: Not on file  Intimate Partner Violence: Not on file   Prior to Admission medications   Medication Sig Start Date End Date Taking? Authorizing  Provider  atorvastatin (LIPITOR) 80 MG tablet Take 80 mg by mouth at bedtime.  12/14/11  Yes [provider]  diclofenac Sodium (VOLTAREN) 1 % GEL Apply 2 g topically daily as needed (pain).   Yes [provider]  escitalopram (LEXAPRO) 10 MG tablet Take 10 mg by mouth at bedtime. 07/16/19  Yes [provider]  ezetimibe (ZETIA) 10 MG tablet Take 10 mg by mouth at bedtime.    Yes [provider]  LORazepam (ATIVAN) 2 MG tablet Take 2 mg by mouth at bedtime. 08/18/15  Yes [provider]  cyclobenzaprine (FLEXERIL) 5 MG tablet Take 1 tablet (5 mg total) by mouth at bedtime. Patient not taking: Reported on 08/02/2020 08/13/19   Alvino Chapel, [place], PA-C  dicyclomine (BENTYL) 10 MG capsule Take 1 capsule (10 mg total) by mouth 2 (two) times daily as needed for spasms. Patient not taking: Reported on 08/02/2020 07/21/19   Malissa Hippo, MD  pantoprazole (PROTONIX) 40 MG tablet Take 1 tablet (40 mg total) by mouth daily. Patient not taking: Reported on 08/02/2020 10/27/19 11/26/19  Marguerita Merles, Reuel Boom, MD  predniSONE (STERAPRED UNI-PAK 21 TAB) 10 MG (21) TBPK tablet Take by mouth daily. Take 6 tabs by mouth daily  for 2 days, then 5 tabs for 2 days, then 4 tabs for 2 days, then 3 tabs for 2 days, 2 tabs for 2 days, then 1 tab by mouth daily for 2 days Patient not taking: No sig reported 08/13/19   Alvino Chapel [place], PA-C

## 2020-08-05 NOTE — Transfer of Care (Signed)
Immediate Anesthesia Transfer of Care Note  Patient: Cynthia Cochran  Procedure(s) Performed: LAMINECTOMY THORACIC TEN-ELEVEN (N/A Back)  Patient Location: PACU  Anesthesia Type:General  Level of Consciousness: awake and alert   Airway & Oxygen Therapy: Patient Spontanous Breathing and Patient connected to nasal cannula oxygen  Post-op Assessment: Report given to RN and Post -op Vital signs reviewed and stable  Post vital signs: Reviewed and stable  Last Vitals:  Vitals Value Taken Time  BP 209/94 08/05/20 1608  Temp    Pulse 68 08/05/20 1612  Resp 9 08/05/20 1613  SpO2 98 % 08/05/20 1612  Vitals shown include unvalidated device data.  Last Pain:  Vitals:   08/05/20 1135  TempSrc:   PainSc: 0-No pain         Complications: No complications documented.

## 2020-08-05 NOTE — Anesthesia Preprocedure Evaluation (Signed)
Anesthesia Evaluation    Reviewed: Allergy & Precautions, Patient's Chart, lab work & pertinent test results  Airway Mallampati: II  TM Distance: >3 FB Neck ROM: Full    Dental no notable dental hx. (+) Dental Advisory Given, Teeth Intact   Pulmonary neg pulmonary ROS,    Pulmonary exam normal breath sounds clear to auscultation       Cardiovascular hypertension (per pt she does not have HTN, usually normotensive except for when she comes in for surgeries. 170-200SBP in preop ), Normal cardiovascular exam Rhythm:Regular Rate:Normal     Neuro/Psych PSYCHIATRIC DISORDERS Anxiety  Neuromuscular disease (leg weakness, numb from waist down 2/2 thoracic myelopathy, poor balance )    GI/Hepatic Neg liver ROS, PUD, GERD  Controlled,  Endo/Other  negative endocrine ROS  Renal/GU negative Renal ROS Bladder dysfunction (urge incontinence )      Musculoskeletal  (+) Arthritis , Osteoarthritis,    Abdominal   Peds  Hematology negative hematology ROS (+) hct 42.1   Anesthesia Other Findings   Reproductive/Obstetrics negative OB ROS                             Anesthesia Physical Anesthesia Plan  ASA: III  Anesthesia Plan: General   Post-op Pain Management:    Induction: Intravenous  PONV Risk Score and Plan: 3 and Ondansetron, Dexamethasone, Midazolam and Treatment may vary due to age or medical condition  Airway Management Planned: Oral ETT  Additional Equipment: None  Intra-op Plan:   Post-operative Plan: Extubation in OR  Informed Consent: I have reviewed the patients History and Physical, chart, labs and discussed the procedure including the risks, benefits and alternatives for the proposed anesthesia with the patient or authorized representative who has indicated his/her understanding and acceptance.     Dental advisory given  Plan Discussed with: CRNA  Anesthesia Plan Comments:          Anesthesia Quick Evaluation

## 2020-08-05 NOTE — Anesthesia Postprocedure Evaluation (Signed)
Anesthesia Post Note  Patient: Cynthia Cochran  Procedure(s) Performed: LAMINECTOMY THORACIC TEN-ELEVEN (N/A Back)     Patient location during evaluation: PACU Anesthesia Type: General Level of consciousness: awake and alert, oriented and patient cooperative Pain management: pain level controlled Vital Signs Assessment: post-procedure vital signs reviewed and stable Respiratory status: spontaneous breathing, nonlabored ventilation and respiratory function stable Cardiovascular status: blood pressure returned to baseline and stable Postop Assessment: no apparent nausea or vomiting Anesthetic complications: no Comments: Pt very hypertensive at baseline, d/w her in PACU that she needs to followup with her PCP regarding starting antihypertensives. Treated with hydralazine to get SBPs <180 in PACU.    No complications documented.  Last Vitals:  Vitals:   08/05/20 1621 08/05/20 1636  BP: (!) 203/83 (!) 190/92  Pulse: 67 73  Resp: (!) 9 13  Temp:    SpO2: 100% 95%    Last Pain:  Vitals:   08/05/20 1606  TempSrc:   PainSc: 0-No pain                 Lannie Fields

## 2020-08-06 ENCOUNTER — Encounter (HOSPITAL_COMMUNITY): Payer: Self-pay | Admitting: Neurosurgery

## 2020-08-06 ENCOUNTER — Inpatient Hospital Stay (HOSPITAL_COMMUNITY): Admission: RE | Admit: 2020-08-06 | Payer: Medicare Other | Source: Ambulatory Visit

## 2020-08-06 DIAGNOSIS — M4714 Other spondylosis with myelopathy, thoracic region: Secondary | ICD-10-CM | POA: Diagnosis not present

## 2020-08-06 DIAGNOSIS — Z79899 Other long term (current) drug therapy: Secondary | ICD-10-CM | POA: Diagnosis not present

## 2020-08-06 DIAGNOSIS — I10 Essential (primary) hypertension: Secondary | ICD-10-CM | POA: Diagnosis not present

## 2020-08-06 DIAGNOSIS — Z96643 Presence of artificial hip joint, bilateral: Secondary | ICD-10-CM | POA: Diagnosis not present

## 2020-08-06 DIAGNOSIS — M4804 Spinal stenosis, thoracic region: Secondary | ICD-10-CM | POA: Diagnosis not present

## 2020-08-06 MED ORDER — HYDROCODONE-ACETAMINOPHEN 10-325 MG PO TABS: 1.0000 | ORAL_TABLET | ORAL | 0 refills | Status: DC | PRN

## 2020-08-06 NOTE — Progress Notes (Signed)
Patient alert and oriented, voiding adequately, MAE well with no difficulty. Incision area cdi with no s/s of infection. Patient discharged home per order. Patient stated understanding of discharge instructions given. Patient has an appointment with Dr. Franky Macho in 2-3 weeks

## 2020-08-06 NOTE — Plan of Care (Signed)
Adequately ready for Discharge 

## 2020-08-06 NOTE — Progress Notes (Signed)
  NEUROSURGERY PROGRESS NOTE   No issues overnight. Complains of appropriate back soreness  Pre op pain resolved  EXAM:  BP (!) 151/80 (BP Location: Left Arm)   Pulse 71   Temp 98 F (36.7 C) (Oral)   Resp 18   Ht 5\' 2"  (1.575 m)   Wt 69.4 kg   SpO2 100%   BMI 27.98 kg/m   Awake, alert, oriented  Speech fluent, appropriate  CN grossly intact  5/5 BUE/BLE Incision: c/d/i  IMPRESSION/PLAN 70 y.o. female POD1 T10-11 laminectomy. Doing well - d/c home

## 2020-08-06 NOTE — Evaluation (Signed)
Occupational Therapy Evaluation Patient Details Name: Cynthia Cochran MRN: [id number] DOB: 22-Aug-1950 Today's Date: 08/06/2020    History of Present Illness Pt is a 70 y/o female who presents s/p T10-T11 laminectomy on 08/05/2020. PMH significant for HTN and arthritis.   Clinical Impression   Patient admitted for the diagnosis and procedure above.  She understands her precautions well, and is following all precautions during ADL and in room mobility at RW level.  Patient will have assist as needed from her niece at home, and should recover quickly.  NO further acute OT needs identified.  Recommend follow up as prescribed by MD.      Follow Up Recommendations  No OT follow up    Equipment Recommendations  None recommended by OT    Recommendations for Other Services       Precautions / Restrictions Precautions Precautions: Back Precaution Booklet Issued: Yes (comment) Precaution Comments: PT Restrictions Weight Bearing Restrictions: No Other Position/Activity Restrictions: No brace needed      Mobility Bed Mobility               General bed mobility comments: Sitting EOB Patient Response: Cooperative  Transfers Overall transfer level: Modified independent Equipment used: Rolling walker (2 wheeled)                  Balance Overall balance assessment: Mild deficits observed, not formally tested                                         ADL either performed or assessed with clinical judgement   ADL Overall ADL's : Modified independent                                             Vision Patient Visual Report: No change from baseline       Perception     Praxis      Pertinent Vitals/Pain Pain Assessment: 0-10 Pain Score: 3  Pain Descriptors / Indicators: Tender;Discomfort Pain Intervention(s): Monitored during session     Hand Dominance Right   Extremity/Trunk Assessment Upper Extremity Assessment Upper  Extremity Assessment: Overall WFL for tasks assessed   Lower Extremity Assessment Lower Extremity Assessment: Defer to PT evaluation   Cervical / Trunk Assessment Cervical / Trunk Assessment: Other exceptions Cervical / Trunk Exceptions: T spine surgery   Communication Communication Communication: No difficulties   Cognition Arousal/Alertness: Awake/alert Behavior During Therapy: WFL for tasks assessed/performed Overall Cognitive Status: Within Functional Limits for tasks assessed                                                      Home Living Family/patient expects to be discharged to:: Private residence Living Arrangements: Other relatives Available Help at Discharge: Family;Available 24 hours/day Type of Home: House       Home Layout: One level     Bathroom Shower/Tub: Producer, television/film/video: Handicapped height Bathroom Accessibility: Yes How Accessible: Accessible via walker Home Equipment: Cane - single point;Adaptive equipment Adaptive Equipment: Reacher;Long-handled sponge        Prior Functioning/Environment Level of Independence: Independent  OT Problem List: Pain;Impaired balance (sitting and/or standing)      OT Treatment/Interventions:      OT Goals(Current goals can be found in the care plan section) Acute Rehab OT Goals Patient Stated Goal: Recover at home OT Goal Formulation: With patient Time For Goal Achievement: 08/06/20 Potential to Achieve Goals: Good  OT Frequency:     Barriers to D/C:  none noted          Co-evaluation              AM-PAC OT "6 Clicks" Daily Activity     Outcome Measure Help from another person eating meals?: None Help from another person taking care of personal grooming?: None Help from another person toileting, which includes using toliet, bedpan, or urinal?: None Help from another person bathing (including washing, rinsing, drying)?: None Help from  another person to put on and taking off regular upper body clothing?: None Help from another person to put on and taking off regular lower body clothing?: None 6 Click Score: 24   End of Session Equipment Utilized During Treatment: Rolling walker Nurse Communication: Other (comment) (ready for discharge)  Activity Tolerance: Patient tolerated treatment well Patient left: in bed;with call bell/phone within reach  OT Visit Diagnosis: Unsteadiness on feet (R26.81);Pain                Time: 3151-7616 OT Time Calculation (min): 21 min Charges:  OT General Charges $OT Visit: 1 Visit OT Evaluation $OT Eval Moderate Complexity: 1 Mod  08/06/2020  Rich, OTR/L  Acute Rehabilitation Services  Office:  [phone]   [name] 08/06/2020, 9:55 AM

## 2020-08-06 NOTE — Discharge Summary (Signed)
Physician Discharge Summary  Patient ID: Cynthia Cochran MRN: [id number] DOB/AGE: February 12, 1951 70 y.o.  Admit date: 08/05/2020 Discharge date: 08/06/2020  Admission Diagnoses:  Thoracic spinal stenosis  Discharge Diagnoses:  Same Active Problems:   Thoracic spinal stenosis   Discharged Condition: Stable  Hospital Course:  Cynthia Cochran is a 70 y.o. female who was admitted for the below procedure. There were no post operative complications. At time of discharge, pain was well controlled, ambulating with Pt/OT, tolerating po, voiding normal. Ready for discharge.  Treatments: Surgery - T10-11 laminectomy  Discharge Exam: Blood pressure (!) 151/80, pulse 71, temperature 98 F (36.7 C), temperature source Oral, resp. rate 18, height 5\' 2"  (1.575 m), weight 69.4 kg, SpO2 100 %. Awake, alert, oriented Speech fluent, appropriate CN grossly intact 5/5 BUE/BLE Wound c/d/i  Disposition: Discharge disposition: 01-Home or Self Care       Discharge Instructions    Call MD for:  difficulty breathing, headache or visual disturbances   Complete by: As directed    Call MD for:  persistant dizziness or light-headedness   Complete by: As directed    Call MD for:  redness, tenderness, or signs of infection (pain, swelling, redness, odor or green/yellow discharge around incision site)   Complete by: As directed    Call MD for:  severe uncontrolled pain   Complete by: As directed    Call MD for:  temperature >100.4   Complete by: As directed    Driving Restrictions   Complete by: As directed    Do not drive until given clearance.   Increase activity slowly   Complete by: As directed    Lifting restrictions   Complete by: As directed    Do not lift anything >10lbs. Avoid bending and twisting in awkward positions. Avoid bending at the back.   May shower / Bathe   Complete by: As directed    In 24 hours. Okay to wash wound with warm soapy water. Avoid scrubbing the wound. Pat dry.    Remove dressing in 48 hours   Complete by: As directed      Allergies as of 08/06/2020      Reactions   Gabapentin Other (See Comments)   Feet went numb   Lyrica [pregabalin]    Mobic [meloxicam] Other (See Comments)   Irritates stomach   Nsaids Other (See Comments)   Irritates stomach - MOBIC       Medication List    STOP taking these medications   dicyclomine 10 MG capsule Commonly known as: Bentyl   pantoprazole 40 MG tablet Commonly known as: PROTONIX   predniSONE 10 MG (21) Tbpk tablet Commonly known as: STERAPRED UNI-PAK 21 TAB     TAKE these medications   atorvastatin 80 MG tablet Commonly known as: LIPITOR Take 80 mg by mouth at bedtime.   cyclobenzaprine 5 MG tablet Commonly known as: FLEXERIL Take 1 tablet (5 mg total) by mouth at bedtime.   diclofenac Sodium 1 % Gel Commonly known as: VOLTAREN Apply 2 g topically daily as needed (pain).   escitalopram 10 MG tablet Commonly known as: LEXAPRO Take 10 mg by mouth at bedtime.   ezetimibe 10 MG tablet Commonly known as: ZETIA Take 10 mg by mouth at bedtime.   HYDROcodone-acetaminophen 10-325 MG tablet Commonly known as: NORCO Take 1 tablet by mouth every 4 (four) hours as needed.   LORazepam 2 MG tablet Commonly known as: ATIVAN Take 2 mg by mouth at bedtime.  Follow-up Information    Coletta Memos, MD Follow up.   Specialty: Neurosurgery Contact information: [address] [place] [address] [phone]               Signed: Alyson Ingles 08/06/2020, 11:01 AM

## 2020-08-06 NOTE — Evaluation (Signed)
Physical Therapy Evaluation Patient Details Name: Cynthia Cochran MRN: [id number] DOB: 1950-10-29 Today's Date: 08/06/2020   History of Present Illness  Pt is a 70 y/o female who presents s/p T10-T11 laminectomy on 08/05/2020. PMH significant for HTN, heart murmur, B hip replacement.    Clinical Impression  Pt admitted with above diagnosis. At the time of PT eval, pt was able to demonstrate transfers and ambulation with up to min assist with [facility] and min guard assist with RW for support. Pt was educated on precautions, appropriate activity progression, and car transfer. Pt currently with functional limitations due to the deficits listed below (see PT Problem List). Pt will benefit from skilled PT to increase their independence and safety with mobility to allow discharge to the venue listed below.      Follow Up Recommendations Outpatient PT;Other (comment) (MD to order when appropriate per post-op protocol.)    Equipment Recommendations  Rolling walker with 5" wheels    Recommendations for Other Services       Precautions / Restrictions Precautions Precautions: Back;Fall Precaution Booklet Issued: Yes (comment) Precaution Comments: Reviewed handout and pt was cued for precautions during functional mobility. Required Braces or Orthoses:  (No brace needed order) Restrictions Weight Bearing Restrictions: No Other Position/Activity Restrictions: No brace needed      Mobility  Bed Mobility Overal bed mobility: Modified Independent             General bed mobility comments: HOB flat and rails lowered to simulate home environment. No assist required. Min cues for optimal log roll technique.    Transfers Overall transfer level: Modified independent Equipment used: Rolling walker (2 wheeled)             General transfer comment: VC's for hand placement on seated surface for safety. No assist required.  Ambulation/Gait Ambulation/Gait assistance: Min guard;Min assist Gait  Distance (Feet): 300 Feet Assistive device: Rolling walker (2 wheeled);Straight cane Gait Pattern/deviations: Step-through pattern;Decreased stride length;Trunk flexed;Decreased weight shift to left Gait velocity: Decreased Gait velocity interpretation: <1.8 ft/sec, indicate of risk for recurrent falls General Gait Details: Initially with SPC and required min assist for safety/balance support. With RW, pt was able to progress to a min guard assist level. Noted 2 instances of R knee buckle during gait training, but with RW pt was able to recover without assistance.  Stairs            Wheelchair Mobility    Modified Rankin (Stroke Patients Only)       Balance Overall balance assessment: Mild deficits observed, not formally tested                                           Pertinent Vitals/Pain Pain Assessment: Faces Pain Score: 3  Faces Pain Scale: Hurts little more Pain Descriptors / Indicators: Discomfort;Operative site guarding Pain Intervention(s): Limited activity within patient's tolerance;Monitored during session;Repositioned    Home Living Family/patient expects to be discharged to:: Private residence Living Arrangements: Other relatives Available Help at Discharge: Family;Available 24 hours/day Type of Home: House Home Access: Stairs to enter   [employer] of Steps: 1 Home Layout: One level Home Equipment: Cane - single point;Adaptive equipment;Walker - 2 wheels      Prior Function Level of Independence: Independent with assistive device(s)         Comments: Pt reports she was independent, however states for the  last 2 weeks has been using the [facility] for support, frequently losing balance and falling into the wall, door jams, etc. Noted multiple bruises on pt's L arm reportedly due to this.     Hand Dominance   Dominant Hand: Right    Extremity/Trunk Assessment   Upper Extremity Assessment Upper Extremity Assessment: Defer  to OT evaluation    Lower Extremity Assessment Lower Extremity Assessment: LLE deficits/detail LLE Deficits / Details: Decreased strength, muscular endurance, and knee buckling consistent with pre-op diagnosis    Cervical / Trunk Assessment Cervical / Trunk Assessment: Other exceptions Cervical / Trunk Exceptions: s/p surgery  Communication   Communication: No difficulties  Cognition Arousal/Alertness: Awake/alert Behavior During Therapy: WFL for tasks assessed/performed Overall Cognitive Status: Within Functional Limits for tasks assessed                                        General Comments      Exercises     Assessment/Plan    PT Assessment Patient needs continued PT services  PT Problem List Decreased strength;Decreased activity tolerance;Decreased balance;Decreased mobility;Decreased knowledge of use of DME;Decreased safety awareness;Decreased knowledge of precautions;Pain       PT Treatment Interventions DME instruction;Gait training;Functional mobility training;Therapeutic activities;Therapeutic exercise;Neuromuscular re-education;Patient/family education    PT Goals (Current goals can be found in the Care Plan section)  Acute Rehab PT Goals Patient Stated Goal: Recover at home PT Goal Formulation: With patient Time For Goal Achievement: 08/13/20 Potential to Achieve Goals: Good    Frequency Min 5X/week   Barriers to discharge        Co-evaluation               AM-PAC PT "6 Clicks" Mobility  Outcome Measure Help needed turning from your back to your side while in a flat bed without using bedrails?: None Help needed moving from lying on your back to sitting on the side of a flat bed without using bedrails?: None Help needed moving to and from a bed to a chair (including a wheelchair)?: None Help needed standing up from a chair using your arms (e.g., wheelchair or bedside chair)?: None Help needed to walk in hospital room?: A  Little Help needed climbing 3-5 steps with a railing? : A Little 6 Click Score: 22    End of Session Equipment Utilized During Treatment: Gait belt Activity Tolerance: Patient tolerated treatment well Patient left: with call bell/phone within reach;with family/visitor present (Sitting EOB awaiting OT) Nurse Communication: Mobility status PT Visit Diagnosis: Unsteadiness on feet (R26.81);Pain Pain - part of body:  (back)    Time: 5631-4970 PT Time Calculation (min) (ACUTE ONLY): 25 min   Charges:   PT Evaluation $PT Eval Low Complexity: 1 Low PT Treatments $Gait Training: 8-22 mins        Conni Slipper, PT, DPT Acute Rehabilitation Services Pager: [phone] Office: [phone]   Marylynn Pearson 08/06/2020, 10:23 AM

## 2020-08-12 ENCOUNTER — Encounter (INDEPENDENT_AMBULATORY_CARE_PROVIDER_SITE_OTHER): Payer: Self-pay | Admitting: Gastroenterology

## 2020-08-12 ENCOUNTER — Ambulatory Visit (INDEPENDENT_AMBULATORY_CARE_PROVIDER_SITE_OTHER): Payer: Medicare Other | Admitting: Gastroenterology

## 2020-08-12 ENCOUNTER — Other Ambulatory Visit: Payer: Self-pay

## 2020-08-12 ENCOUNTER — Telehealth (INDEPENDENT_AMBULATORY_CARE_PROVIDER_SITE_OTHER): Payer: Self-pay

## 2020-08-12 DIAGNOSIS — K5900 Constipation, unspecified: Secondary | ICD-10-CM | POA: Insufficient documentation

## 2020-08-12 DIAGNOSIS — K5909 Other constipation: Secondary | ICD-10-CM

## 2020-08-12 MED ORDER — PEG 3350-KCL-NA BICARB-NACL 420 G PO SOLR: 4000.0000 mL | Freq: Once | ORAL | 0 refills | Status: AC

## 2020-08-12 MED ORDER — LINACLOTIDE 145 MCG PO CAPS
145.0000 ug | ORAL_CAPSULE | Freq: Every day | ORAL | 3 refills | Status: DC
Start: 2020-08-12 — End: 2020-10-12

## 2020-08-12 NOTE — Telephone Encounter (Signed)
Patient aware of all.

## 2020-08-12 NOTE — Progress Notes (Signed)
Katrinka Blazing, M.D. Gastroenterology & Hepatology [facility] [address] [place], [place] [address]  Primary Care Physician: Benita Stabile, MD [address] [place] [address]  I will communicate my assessment and recommendations to the referring MD via EMR.  Problems: 1. Constipation 2. IBS-C  History of Present Illness: Cynthia Cochran is a 70 y.o. female with past medical history of IBS, GERD, hyperlipidemia, sciatica, who presents for evaluation of constipation.  The patient was last seen on 07/21/2019. At that time, the patient was being treated with dicyclomine 10 mg 3 times daily for management of IBS and she was counseled to increase her pantoprazole to 40 mg daily for management of GERD.  The patient states that she had a chronic history of constipation for multiple years for which she used stool softeners daily. Used to have a BM at least every other day with this regimen. However, 3 weeks ago she developed new onset numbness in her pelvic area and lower extremities, along with alterations in her gait.  She also noticed that her constipation became very severe as she was having a bowel movement every week.  Notably, the patient had changes in MRI in April suggestive of spinal cord compression. Due to this, she underwent a T10-11 laminectomy surgery by Dr. Franky Macho on 08/05/2020.  Did not have any complications postoperatively  -year-old patient had a successful surgery, she has persistent constipation.  States that in the last three weeks she has taken Dulcolax 6-9 times per day, along with suppositories and enemas but has not had any relief of her constipation. She has also tried Miralax 1 capful every day for the last 3 weeks. She reports that she has had 2 bowel movements in the last 2 weeks. She does not feel any stool when she is applying laxatives.  Due to constipation, she is having significant abdominal  distention/bloating, especially in the epigastric area.  She is taking Norco since her surgery was performed, but only takes it 2 times a day as she wants to avoid making her constipation worse.   The patient denies having any nausea, vomiting, fever, chills, hematochezia, melena, hematemesis, jaundice, pruritus or weight loss.  Last EGD: 10/04/2018, normal esophagus, 2 cm hiatal hernia, presence of a scar in the gastric antrum, gastritis with negative biopsies for H. pylori.  Scant duodenal erythema. Last Colonoscopy: 10/04/2018, diverticulosis, external hemorrhoids, recommended to have a repeat colonoscopy in 5 years  Past Medical History: Past Medical History:  Diagnosis Date  . Anxiety   . Arthritis   . Blood transfusion without reported diagnosis 1982   after c section  . Dysphagia 04/25/2016  . Gastric ulcer   . GERD (gastroesophageal reflux disease)   . Heart murmur   . High cholesterol 04/25/2016  . Hyperlipidemia   . Mixed stress and urge urinary incontinence 09/09/2015  . Sciatic nerve pain   . Spinal stenosis     Past Surgical History: Past Surgical History:  Procedure Laterality Date  . BIOPSY  05/12/2016   Procedure: BIOPSY;  Surgeon: Malissa Hippo, MD;  [facility];  Service: Endoscopy;;  gastric  . BIOPSY  10/04/2018   Procedure: BIOPSY;  Surgeon: Malissa Hippo, MD;  [facility];  Service: Endoscopy;;  gastric   . CARPAL TUNNEL RELEASE     right; 1998, left 1998  . CESAREAN SECTION  1982  . COLONOSCOPY WITH PROPOFOL N/A 10/04/2018   Procedure: COLONOSCOPY  WITH PROPOFOL;  Surgeon: Malissa Hippo, MD;  [facility]: [facility];  Service: Endoscopy;  Laterality: N/A;  7:30  . Colonscopy    . ESOPHAGEAL DILATION N/A 05/12/2016   Procedure: ESOPHAGEAL DILATION;  Surgeon: Malissa Hippo, MD;  [facility]: [facility];  Service: Endoscopy;  Laterality: N/A;  . ESOPHAGOGASTRODUODENOSCOPY N/A 05/12/2016   Procedure: ESOPHAGOGASTRODUODENOSCOPY (EGD);   Surgeon: Malissa Hippo, MD;  [facility];  Service: Endoscopy;  Laterality: N/A;  9:30  . ESOPHAGOGASTRODUODENOSCOPY (EGD) WITH PROPOFOL N/A 10/04/2018   Procedure: ESOPHAGOGASTRODUODENOSCOPY (EGD) WITH PROPOFOL;  Surgeon: Malissa Hippo, MD;  [facility];  Service: Endoscopy;  Laterality: N/A;  . EYE SURGERY     lasix  . JOINT REPLACEMENT     both hips  . LUMBAR LAMINECTOMY/DECOMPRESSION MICRODISCECTOMY N/A 08/05/2020   Procedure: LAMINECTOMY THORACIC TEN-ELEVEN;  Surgeon: Coletta Memos, MD;  Location: [facility] OR;  Service: Neurosurgery;  Laterality: N/A;  . TOTAL HIP ARTHROPLASTY     right; March 2013; left March 2011    Family History: Family History  Problem Relation Age of Onset  . Colon cancer Father 66  . Colon cancer Brother 50  . Other Mother        heavy smoker; died from arterial sclerosis  . Hypertension Sister   . Stomach cancer Neg Hx     Social History: Social History   Tobacco Use  Smoking Status Never Smoker  Smokeless Tobacco Never Used   Social History   Substance and Sexual Activity  Alcohol Use Not Currently   Social History   Substance and Sexual Activity  Drug Use No    Allergies: Allergies  Allergen Reactions  . Gabapentin Other (See Comments)    Feet went numb  . Lyrica [Pregabalin]   . Mobic [Meloxicam] Other (See Comments)    Irritates stomach  . Nsaids Other (See Comments)    Irritates stomach - MOBIC      Medications: Current Outpatient Medications  Medication Sig Dispense Refill  . atorvastatin (LIPITOR) 80 MG tablet Take 80 mg by mouth at bedtime.     . diclofenac Sodium (VOLTAREN) 1 % GEL Apply 2 g topically daily as needed (pain).    Marland Kitchen escitalopram (LEXAPRO) 10 MG tablet Take 10 mg by mouth at bedtime.    Marland Kitchen ezetimibe (ZETIA) 10 MG tablet Take 10 mg by mouth at bedtime.     Marland Kitchen HYDROcodone-acetaminophen (NORCO) 10-325 MG tablet Take 1 tablet by mouth every 4 (four) hours as needed. 60 tablet 0  .  LORazepam (ATIVAN) 2 MG tablet Take 2 mg by mouth at bedtime.  2  . cyclobenzaprine (FLEXERIL) 5 MG tablet Take 1 tablet (5 mg total) by mouth at bedtime. (Patient not taking: No sig reported) 12 tablet 0   No current facility-administered medications for this visit.    Review of Systems: GENERAL: negative for malaise, night sweats HEENT: No changes in hearing or vision, no nose bleeds or other nasal problems. NECK: Negative for lumps, goiter, pain and significant neck swelling RESPIRATORY: Negative for cough, wheezing CARDIOVASCULAR: Negative for chest pain, leg swelling, palpitations, orthopnea GI: SEE HPI MUSCULOSKELETAL: Negative for joint pain or swelling, back pain, and muscle pain. SKIN: Negative for lesions, rash PSYCH: Negative for sleep disturbance, mood disorder and recent psychosocial stressors. HEMATOLOGY Negative for prolonged bleeding, bruising easily, and swollen nodes. ENDOCRINE: Negative for cold or heat intolerance, polyuria, polydipsia and goiter. NEURO: negative for tremor, gait imbalance, syncope and seizures. The remainder  of the review of systems is noncontributory.   Physical Exam: BP (!) 171/78 (BP Location: Left Arm, Patient Position: Sitting, Cuff Size: Large)   Pulse 81   Temp 99.3 F (37.4 C) (Oral)   Ht 5\' 2"  (1.575 m)   Wt 158 lb (71.7 kg)   BMI 28.90 kg/m  GENERAL: The patient is AO x3, in no acute distress. HEENT: Head is normocephalic and atraumatic. EOMI are intact. Mouth is well hydrated and without lesions. NECK: Supple. No masses LUNGS: Clear to auscultation. No presence of rhonchi/wheezing/rales. Adequate chest expansion HEART: RRR, normal s1 and s2. ABDOMEN: Soft, nontender, no guarding, no peritoneal signs, and nondistended. BS +. No masses. EXTREMITIES: Without any cyanosis, clubbing, rash, lesions or edema. NEUROLOGIC: AOx3, no focal motor deficit. SKIN: no jaundice, no rashes  Imaging/Labs: as above  I personally reviewed and  interpreted the available labs, imaging and endoscopic files.  Impression and Plan: Cynthia Cochran is a 70 y.o. female with past medical history of IBS, GERD, hyperlipidemia, sciatica, who presents for evaluation of constipation.  The patient has presented worsening constipation since her recent surgical procedure.  She had evidence of spinal cord compression and increased imaging.  I explained to the patient that if the severity of this compression was significant, this will have led to permanent damage in her parasympathetic system, leading to her current constipation.  At this time, the patient will benefit from taking a bowel prep to improve her bowel movement frequency.  I counseled the patient to start taking Linzess 145 mcg every day after finishing her bowel prep to make her bowel movement more frequent.  We will need to follow her clinical progress as it is possible some of the spinal cord damage may be permanent.  The patient understood and agreed.  - Take Golytely bowel prep - Start Linzess 145 mcg qday after finishing the bowel prep  All questions were answered.      78, MD Gastroenterology and Hepatology [facility]

## 2020-08-12 NOTE — Telephone Encounter (Signed)
Unfortunately, her insurance does not cover many of the other alternatives for constipation such as Amitiza or Trulance, the co-pay is still very high.  Patient will like to try taking the bowel prep and then will start taking higher dose of MiraLAX 2 capfuls every day and continue with the stool softener.

## 2020-08-12 NOTE — Telephone Encounter (Signed)
Patient called states she was prescribed Linzess this is too expensive she states the co pay was still 600.00 after insurance. Please advise an alternative.

## 2020-08-23 ENCOUNTER — Ambulatory Visit (INDEPENDENT_AMBULATORY_CARE_PROVIDER_SITE_OTHER): Payer: Medicare Other | Admitting: Gastroenterology

## 2020-09-06 DIAGNOSIS — F329 Major depressive disorder, single episode, unspecified: Secondary | ICD-10-CM | POA: Insufficient documentation

## 2020-09-06 DIAGNOSIS — K5909 Other constipation: Secondary | ICD-10-CM | POA: Diagnosis not present

## 2020-09-23 ENCOUNTER — Other Ambulatory Visit: Payer: Self-pay

## 2020-09-23 MED ORDER — ESCITALOPRAM OXALATE 10 MG PO TABS
ORAL_TABLET | ORAL | 0 refills | Status: DC
  Filled 2020-09-23: qty 30, 30d supply, fill #0

## 2020-09-24 ENCOUNTER — Other Ambulatory Visit: Payer: Self-pay

## 2020-09-28 DIAGNOSIS — E782 Mixed hyperlipidemia: Secondary | ICD-10-CM | POA: Diagnosis not present

## 2020-09-28 DIAGNOSIS — I10 Essential (primary) hypertension: Secondary | ICD-10-CM | POA: Diagnosis not present

## 2020-09-28 DIAGNOSIS — E663 Overweight: Secondary | ICD-10-CM | POA: Diagnosis not present

## 2020-09-28 DIAGNOSIS — E785 Hyperlipidemia, unspecified: Secondary | ICD-10-CM | POA: Diagnosis not present

## 2020-09-28 DIAGNOSIS — Z6828 Body mass index (BMI) 28.0-28.9, adult: Secondary | ICD-10-CM | POA: Diagnosis not present

## 2020-09-28 DIAGNOSIS — R945 Abnormal results of liver function studies: Secondary | ICD-10-CM | POA: Diagnosis not present

## 2020-09-28 DIAGNOSIS — R7301 Impaired fasting glucose: Secondary | ICD-10-CM | POA: Diagnosis not present

## 2020-09-28 DIAGNOSIS — Z79899 Other long term (current) drug therapy: Secondary | ICD-10-CM | POA: Diagnosis not present

## 2020-09-29 ENCOUNTER — Other Ambulatory Visit: Payer: Self-pay

## 2020-10-05 DIAGNOSIS — E782 Mixed hyperlipidemia: Secondary | ICD-10-CM | POA: Insufficient documentation

## 2020-10-05 DIAGNOSIS — F411 Generalized anxiety disorder: Secondary | ICD-10-CM | POA: Insufficient documentation

## 2020-10-05 DIAGNOSIS — G629 Polyneuropathy, unspecified: Secondary | ICD-10-CM | POA: Insufficient documentation

## 2020-10-06 DIAGNOSIS — F321 Major depressive disorder, single episode, moderate: Secondary | ICD-10-CM | POA: Diagnosis not present

## 2020-10-06 DIAGNOSIS — M542 Cervicalgia: Secondary | ICD-10-CM | POA: Diagnosis not present

## 2020-10-06 DIAGNOSIS — R32 Unspecified urinary incontinence: Secondary | ICD-10-CM | POA: Diagnosis not present

## 2020-10-06 DIAGNOSIS — I10 Essential (primary) hypertension: Secondary | ICD-10-CM | POA: Insufficient documentation

## 2020-10-06 DIAGNOSIS — M4802 Spinal stenosis, cervical region: Secondary | ICD-10-CM | POA: Diagnosis not present

## 2020-10-06 DIAGNOSIS — K277 Chronic peptic ulcer, site unspecified, without hemorrhage or perforation: Secondary | ICD-10-CM | POA: Diagnosis not present

## 2020-10-06 DIAGNOSIS — E663 Overweight: Secondary | ICD-10-CM | POA: Diagnosis not present

## 2020-10-06 DIAGNOSIS — M25552 Pain in left hip: Secondary | ICD-10-CM | POA: Diagnosis not present

## 2020-10-06 DIAGNOSIS — F411 Generalized anxiety disorder: Secondary | ICD-10-CM | POA: Diagnosis not present

## 2020-10-06 DIAGNOSIS — M5442 Lumbago with sciatica, left side: Secondary | ICD-10-CM | POA: Diagnosis not present

## 2020-10-06 DIAGNOSIS — E782 Mixed hyperlipidemia: Secondary | ICD-10-CM | POA: Diagnosis not present

## 2020-10-06 DIAGNOSIS — G629 Polyneuropathy, unspecified: Secondary | ICD-10-CM | POA: Diagnosis not present

## 2020-10-12 ENCOUNTER — Other Ambulatory Visit: Payer: Self-pay | Admitting: Neurosurgery

## 2020-10-12 DIAGNOSIS — M4804 Spinal stenosis, thoracic region: Secondary | ICD-10-CM | POA: Diagnosis not present

## 2020-10-12 DIAGNOSIS — M4714 Other spondylosis with myelopathy, thoracic region: Secondary | ICD-10-CM | POA: Diagnosis not present

## 2020-10-12 DIAGNOSIS — M5104 Intervertebral disc disorders with myelopathy, thoracic region: Secondary | ICD-10-CM | POA: Diagnosis not present

## 2020-10-13 NOTE — Progress Notes (Signed)
PCP - Dr. Nita Sells Gastroenterologist - Dr. Lionel December Cardiologist - denies  Chest xray - n/a EKG - 08/04/20 Stress test - n/a Echo - n/a Cardiac cath - n/a  Sleep study - denies  Pt stated that she is now taking losartan due to blood pressure being elevated (192/80's).  Patient states that is due to pain.

## 2020-10-14 ENCOUNTER — Ambulatory Visit (HOSPITAL_COMMUNITY): Payer: Medicare Other | Admitting: Anesthesiology

## 2020-10-14 ENCOUNTER — Other Ambulatory Visit: Payer: Self-pay

## 2020-10-14 ENCOUNTER — Encounter (HOSPITAL_COMMUNITY): Payer: Self-pay | Admitting: Neurosurgery

## 2020-10-14 ENCOUNTER — Encounter (HOSPITAL_COMMUNITY)
Admission: RE | Disposition: A | Discharge status: Home / Self Care | Payer: Self-pay | Source: Home / Self Care | Attending: Neurosurgery

## 2020-10-14 ENCOUNTER — Ambulatory Visit (HOSPITAL_COMMUNITY): Payer: Medicare Other

## 2020-10-14 ENCOUNTER — Observation Stay (HOSPITAL_COMMUNITY)
Admission: RE | Admit: 2020-10-14 | Discharge: 2020-10-16 | Disposition: A | Discharge status: Home / Self Care | Payer: Medicare Other | Attending: Neurosurgery | Admitting: Neurosurgery

## 2020-10-14 DIAGNOSIS — E7800 Pure hypercholesterolemia, unspecified: Secondary | ICD-10-CM | POA: Diagnosis not present

## 2020-10-14 DIAGNOSIS — M4726 Other spondylosis with radiculopathy, lumbar region: Secondary | ICD-10-CM | POA: Diagnosis not present

## 2020-10-14 DIAGNOSIS — Z4889 Encounter for other specified surgical aftercare: Secondary | ICD-10-CM | POA: Diagnosis not present

## 2020-10-14 DIAGNOSIS — Z20822 Contact with and (suspected) exposure to covid-19: Secondary | ICD-10-CM | POA: Insufficient documentation

## 2020-10-14 DIAGNOSIS — M48061 Spinal stenosis, lumbar region without neurogenic claudication: Secondary | ICD-10-CM | POA: Diagnosis not present

## 2020-10-14 DIAGNOSIS — Z419 Encounter for procedure for purposes other than remedying health state, unspecified: Secondary | ICD-10-CM

## 2020-10-14 DIAGNOSIS — M47817 Spondylosis without myelopathy or radiculopathy, lumbosacral region: Secondary | ICD-10-CM | POA: Diagnosis not present

## 2020-10-14 DIAGNOSIS — K219 Gastro-esophageal reflux disease without esophagitis: Secondary | ICD-10-CM | POA: Diagnosis not present

## 2020-10-14 DIAGNOSIS — M4804 Spinal stenosis, thoracic region: Secondary | ICD-10-CM | POA: Diagnosis not present

## 2020-10-14 DIAGNOSIS — M47816 Spondylosis without myelopathy or radiculopathy, lumbar region: Secondary | ICD-10-CM | POA: Diagnosis not present

## 2020-10-14 DIAGNOSIS — M4696 Unspecified inflammatory spondylopathy, lumbar region: Secondary | ICD-10-CM | POA: Insufficient documentation

## 2020-10-14 HISTORY — PX: LUMBAR LAMINECTOMY/DECOMPRESSION MICRODISCECTOMY: SHX5026

## 2020-10-14 LAB — BASIC METABOLIC PANEL
Anion gap: 10 (ref 5–15)
BUN: 15 mg/dL (ref 8–23)
CO2: 22 mmol/L (ref 22–32)
Calcium: 8.8 mg/dL — ABNORMAL LOW (ref 8.9–10.3)
Chloride: 106 mmol/L (ref 98–111)
Creatinine, Ser: 0.61 mg/dL (ref 0.44–1.00)
GFR, Estimated: >60 mL/min (ref >60)
Glucose, Bld: 100 mg/dL — ABNORMAL HIGH (ref 70–99)
Potassium: 3.6 mmol/L (ref 3.5–5.1)
Sodium: 138 mmol/L (ref 135–145)

## 2020-10-14 LAB — SURGICAL PCR SCREEN
MRSA, PCR: NEGATIVE
Staphylococcus aureus: NEGATIVE

## 2020-10-14 LAB — CBC
HCT: 40.3 % (ref 36.0–46.0)
Hemoglobin: 13.2 g/dL (ref 12.0–15.0)
MCH: 29.9 pg (ref 26.0–34.0)
MCHC: 32.8 g/dL (ref 30.0–36.0)
MCV: 91.4 fL (ref 80.0–100.0)
Platelets: 167 10*3/uL (ref 150–400)
RBC: 4.41 MIL/uL (ref 3.87–5.11)
RDW: 12.9 % (ref 11.5–15.5)
WBC: 4.8 10*3/uL (ref 4.0–10.5)
nRBC: 0 % (ref 0.0–0.2)

## 2020-10-14 LAB — SARS CORONAVIRUS 2 BY RT PCR (HOSPITAL ORDER, PERFORMED IN ~~LOC~~ HOSPITAL LAB): SARS Coronavirus 2: NEGATIVE

## 2020-10-14 SURGERY — LUMBAR LAMINECTOMY/DECOMPRESSION MICRODISCECTOMY 2 LEVELS
Anesthesia: General | Site: Spine Lumbar

## 2020-10-14 MED ORDER — SENNA 8.6 MG PO TABS
1.0000 | ORAL_TABLET | Freq: Two times a day (BID) | ORAL | Status: DC
  Administered 2020-10-14 – 2020-10-16 (×4): 8.6 mg via ORAL
  Filled 2020-10-14 (×4): qty 1

## 2020-10-14 MED ORDER — BUPIVACAINE HCL (PF) 0.25 % IJ SOLN
INTRAMUSCULAR | Status: AC
  Filled 2020-10-14: qty 30

## 2020-10-14 MED ORDER — SODIUM CHLORIDE 0.9% FLUSH: 3.0000 mL | Freq: Two times a day (BID) | INTRAVENOUS | Status: DC

## 2020-10-14 MED ORDER — ONDANSETRON HCL 4 MG PO TABS: 4.0000 mg | ORAL_TABLET | Freq: Four times a day (QID) | ORAL | Status: DC | PRN

## 2020-10-14 MED ORDER — FENTANYL CITRATE (PF) 100 MCG/2ML IJ SOLN
INTRAMUSCULAR | Status: DC | PRN
  Administered 2020-10-14: 100 ug via INTRAVENOUS
  Administered 2020-10-14: 50 ug via INTRAVENOUS
  Administered 2020-10-14 (×2): 100 ug via INTRAVENOUS

## 2020-10-14 MED ORDER — LOSARTAN POTASSIUM 50 MG PO TABS
25.0000 mg | ORAL_TABLET | Freq: Every day | ORAL | Status: DC
  Administered 2020-10-15 – 2020-10-16 (×2): 25 mg via ORAL
  Filled 2020-10-14 (×2): qty 1

## 2020-10-14 MED ORDER — PHENOL 1.4 % MT LIQD: 1.0000 | OROMUCOSAL | Status: DC | PRN

## 2020-10-14 MED ORDER — LIDOCAINE HCL (CARDIAC) PF 100 MG/5ML IV SOSY
PREFILLED_SYRINGE | INTRAVENOUS | Status: DC | PRN
  Administered 2020-10-14: 30 mg via INTRAVENOUS

## 2020-10-14 MED ORDER — CEFAZOLIN SODIUM-DEXTROSE 2-4 GM/100ML-% IV SOLN
2.0000 g | INTRAVENOUS | Status: AC
  Administered 2020-10-14: 2 g via INTRAVENOUS
  Filled 2020-10-14: qty 100

## 2020-10-14 MED ORDER — LACTATED RINGERS IV SOLN: INTRAVENOUS | Status: DC | PRN

## 2020-10-14 MED ORDER — EZETIMIBE 10 MG PO TABS
10.0000 mg | ORAL_TABLET | Freq: Every day | ORAL | Status: DC
  Administered 2020-10-14 – 2020-10-15 (×2): 10 mg via ORAL
  Filled 2020-10-14 (×2): qty 1

## 2020-10-14 MED ORDER — ACETAMINOPHEN 500 MG PO TABS
1000.0000 mg | ORAL_TABLET | Freq: Four times a day (QID) | ORAL | Status: AC
  Administered 2020-10-14 – 2020-10-15 (×4): 1000 mg via ORAL
  Filled 2020-10-14 (×4): qty 2

## 2020-10-14 MED ORDER — FENTANYL CITRATE (PF) 250 MCG/5ML IJ SOLN
INTRAMUSCULAR | Status: AC
  Filled 2020-10-14: qty 5

## 2020-10-14 MED ORDER — ONDANSETRON HCL 4 MG/2ML IJ SOLN
INTRAMUSCULAR | Status: DC | PRN
  Administered 2020-10-14: 4 mg via INTRAVENOUS

## 2020-10-14 MED ORDER — SENNOSIDES-DOCUSATE SODIUM 8.6-50 MG PO TABS: 1.0000 | ORAL_TABLET | Freq: Every evening | ORAL | Status: DC | PRN

## 2020-10-14 MED ORDER — CHLORHEXIDINE GLUCONATE CLOTH 2 % EX PADS: 6.0000 | MEDICATED_PAD | Freq: Once | CUTANEOUS | Status: DC

## 2020-10-14 MED ORDER — HYDRALAZINE HCL 20 MG/ML IJ SOLN
5.0000 mg | Freq: Once | INTRAMUSCULAR | Status: AC
  Administered 2020-10-14: 5 mg via INTRAVENOUS

## 2020-10-14 MED ORDER — SUGAMMADEX SODIUM 200 MG/2ML IV SOLN
INTRAVENOUS | Status: DC | PRN
  Administered 2020-10-14: 300 mg via INTRAVENOUS

## 2020-10-14 MED ORDER — ONDANSETRON HCL 4 MG/2ML IJ SOLN: 4.0000 mg | Freq: Four times a day (QID) | INTRAMUSCULAR | Status: DC | PRN

## 2020-10-14 MED ORDER — ACETAMINOPHEN 325 MG PO TABS
650.0000 mg | ORAL_TABLET | ORAL | Status: DC | PRN
  Administered 2020-10-15 – 2020-10-16 (×3): 650 mg via ORAL
  Filled 2020-10-14 (×3): qty 2

## 2020-10-14 MED ORDER — CHLORHEXIDINE GLUCONATE 0.12 % MT SOLN
OROMUCOSAL | Status: AC
  Administered 2020-10-14: 15 mL via OROMUCOSAL
  Filled 2020-10-14: qty 15

## 2020-10-14 MED ORDER — PROPOFOL 10 MG/ML IV BOLUS
INTRAVENOUS | Status: DC | PRN
  Administered 2020-10-14: 150 mg via INTRAVENOUS

## 2020-10-14 MED ORDER — DIAZEPAM 5 MG PO TABS
5.0000 mg | ORAL_TABLET | Freq: Four times a day (QID) | ORAL | Status: DC | PRN
  Administered 2020-10-15 (×2): 5 mg via ORAL
  Filled 2020-10-14 (×2): qty 1

## 2020-10-14 MED ORDER — MORPHINE SULFATE (PF) 2 MG/ML IV SOLN: 2.0000 mg | INTRAVENOUS | Status: DC | PRN

## 2020-10-14 MED ORDER — ZOLPIDEM TARTRATE 5 MG PO TABS: 5.0000 mg | ORAL_TABLET | Freq: Every evening | ORAL | Status: DC | PRN

## 2020-10-14 MED ORDER — ESCITALOPRAM OXALATE 10 MG PO TABS
10.0000 mg | ORAL_TABLET | Freq: Every day | ORAL | Status: DC
  Administered 2020-10-14 – 2020-10-16 (×3): 10 mg via ORAL
  Filled 2020-10-14 (×3): qty 1

## 2020-10-14 MED ORDER — DOCUSATE SODIUM 50 MG PO CAPS
50.0000 mg | ORAL_CAPSULE | Freq: Every day | ORAL | Status: DC
  Administered 2020-10-15 – 2020-10-16 (×2): 50 mg via ORAL
  Filled 2020-10-14 (×3): qty 1

## 2020-10-14 MED ORDER — OXYBUTYNIN CHLORIDE ER 5 MG PO TB24
5.0000 mg | ORAL_TABLET | Freq: Every day | ORAL | Status: DC
  Administered 2020-10-14 – 2020-10-16 (×3): 5 mg via ORAL
  Filled 2020-10-14 (×3): qty 1

## 2020-10-14 MED ORDER — HYDRALAZINE HCL 20 MG/ML IJ SOLN
INTRAMUSCULAR | Status: AC
  Administered 2020-10-14: 5 mg
  Filled 2020-10-14: qty 1

## 2020-10-14 MED ORDER — BISACODYL 5 MG PO TBEC: 5.0000 mg | DELAYED_RELEASE_TABLET | Freq: Every day | ORAL | Status: DC | PRN

## 2020-10-14 MED ORDER — MENTHOL 3 MG MT LOZG: 1.0000 | LOZENGE | OROMUCOSAL | Status: DC | PRN

## 2020-10-14 MED ORDER — MIDAZOLAM HCL 5 MG/5ML IJ SOLN
INTRAMUSCULAR | Status: DC | PRN
  Administered 2020-10-14: 2 mg via INTRAVENOUS

## 2020-10-14 MED ORDER — SUCCINYLCHOLINE CHLORIDE 200 MG/10ML IV SOSY
PREFILLED_SYRINGE | INTRAVENOUS | Status: DC | PRN
  Administered 2020-10-14: 100 mg via INTRAVENOUS

## 2020-10-14 MED ORDER — FENTANYL CITRATE (PF) 100 MCG/2ML IJ SOLN
INTRAMUSCULAR | Status: AC
  Filled 2020-10-14: qty 2

## 2020-10-14 MED ORDER — CHLORHEXIDINE GLUCONATE 0.12 % MT SOLN: 15.0000 mL | Freq: Once | OROMUCOSAL | Status: AC

## 2020-10-14 MED ORDER — SODIUM CHLORIDE 0.9 % IV SOLN: 250.0000 mL | INTRAVENOUS | Status: DC

## 2020-10-14 MED ORDER — ROCURONIUM BROMIDE 100 MG/10ML IV SOLN
INTRAVENOUS | Status: DC | PRN
  Administered 2020-10-14: 10 mg via INTRAVENOUS
  Administered 2020-10-14: 40 mg via INTRAVENOUS

## 2020-10-14 MED ORDER — MIDAZOLAM HCL 2 MG/2ML IJ SOLN
INTRAMUSCULAR | Status: AC
  Filled 2020-10-14: qty 2

## 2020-10-14 MED ORDER — ORAL CARE MOUTH RINSE: 15.0000 mL | Freq: Once | OROMUCOSAL | Status: AC

## 2020-10-14 MED ORDER — ATORVASTATIN CALCIUM 80 MG PO TABS
80.0000 mg | ORAL_TABLET | Freq: Every day | ORAL | Status: DC
  Administered 2020-10-14 – 2020-10-15 (×2): 80 mg via ORAL
  Filled 2020-10-14 (×2): qty 1

## 2020-10-14 MED ORDER — OXYCODONE HCL ER 10 MG PO T12A
10.0000 mg | EXTENDED_RELEASE_TABLET | Freq: Two times a day (BID) | ORAL | Status: DC
  Administered 2020-10-14 – 2020-10-16 (×4): 10 mg via ORAL
  Filled 2020-10-14 (×4): qty 1

## 2020-10-14 MED ORDER — FENTANYL CITRATE (PF) 100 MCG/2ML IJ SOLN
25.0000 ug | INTRAMUSCULAR | Status: DC | PRN
  Administered 2020-10-14 (×3): 50 ug via INTRAVENOUS

## 2020-10-14 MED ORDER — OXYCODONE HCL 5 MG PO TABS
10.0000 mg | ORAL_TABLET | ORAL | Status: DC | PRN
  Administered 2020-10-14 – 2020-10-16 (×7): 10 mg via ORAL
  Filled 2020-10-14 (×7): qty 2

## 2020-10-14 MED ORDER — DULOXETINE HCL 30 MG PO CPEP
30.0000 mg | ORAL_CAPSULE | Freq: Every day | ORAL | Status: DC
  Administered 2020-10-15 – 2020-10-16 (×2): 30 mg via ORAL
  Filled 2020-10-14 (×2): qty 1

## 2020-10-14 MED ORDER — BUPIVACAINE HCL (PF) 0.25 % IJ SOLN
INTRAMUSCULAR | Status: DC | PRN
  Administered 2020-10-14: 20 mL

## 2020-10-14 MED ORDER — OXYCODONE HCL 5 MG PO TABS
5.0000 mg | ORAL_TABLET | ORAL | Status: DC | PRN
  Administered 2020-10-15: 5 mg via ORAL
  Filled 2020-10-14: qty 1

## 2020-10-14 MED ORDER — LACTATED RINGERS IV SOLN: INTRAVENOUS | Status: DC

## 2020-10-14 MED ORDER — LIDOCAINE-EPINEPHRINE 0.5 %-1:200000 IJ SOLN
INTRAMUSCULAR | Status: AC
  Filled 2020-10-14: qty 1

## 2020-10-14 MED ORDER — THROMBIN 20000 UNITS EX SOLR
CUTANEOUS | Status: DC | PRN
  Administered 2020-10-14: 20 mL

## 2020-10-14 MED ORDER — DEXAMETHASONE SODIUM PHOSPHATE 10 MG/ML IJ SOLN
INTRAMUSCULAR | Status: DC | PRN
  Administered 2020-10-14: 10 mg via INTRAVENOUS

## 2020-10-14 MED ORDER — POTASSIUM CHLORIDE IN NACL 20-0.9 MEQ/L-% IV SOLN: INTRAVENOUS | Status: DC

## 2020-10-14 MED ORDER — THROMBIN 20000 UNITS EX SOLR
CUTANEOUS | Status: AC
  Filled 2020-10-14: qty 20000

## 2020-10-14 MED ORDER — LIDOCAINE-EPINEPHRINE 0.5 %-1:200000 IJ SOLN
INTRAMUSCULAR | Status: DC | PRN
  Administered 2020-10-14: 20 mL

## 2020-10-14 MED ORDER — 0.9 % SODIUM CHLORIDE (POUR BTL) OPTIME
TOPICAL | Status: DC | PRN
  Administered 2020-10-14: 1000 mL

## 2020-10-14 MED ORDER — FLEET ENEMA 7-19 GM/118ML RE ENEM: 1.0000 | ENEMA | Freq: Once | RECTAL | Status: DC | PRN

## 2020-10-14 MED ORDER — LORAZEPAM 0.5 MG PO TABS
2.0000 mg | ORAL_TABLET | Freq: Every day | ORAL | Status: DC
  Administered 2020-10-14 – 2020-10-15 (×2): 2 mg via ORAL
  Filled 2020-10-14 (×2): qty 4

## 2020-10-14 MED ORDER — ACETAMINOPHEN 650 MG RE SUPP: 650.0000 mg | RECTAL | Status: DC | PRN

## 2020-10-14 MED ORDER — SODIUM CHLORIDE 0.9% FLUSH: 3.0000 mL | INTRAVENOUS | Status: DC | PRN

## 2020-10-14 SURGICAL SUPPLY — 47 items
BAG COUNTER SPONGE SURGICOUNT (BAG) ×2 IMPLANT
BAND RUBBER #18 3X1/16 STRL (MISCELLANEOUS) ×4 IMPLANT
BENZOIN TINCTURE PRP APPL 2/3 (GAUZE/BANDAGES/DRESSINGS) IMPLANT
BLADE CLIPPER SURG (BLADE) IMPLANT
BUR MATCHSTICK NEURO 3.0 LAGG (BURR) ×2 IMPLANT
BUR PRECISION FLUTE 5.0 (BURR) IMPLANT
CANISTER SUCT 3000ML PPV (MISCELLANEOUS) ×2 IMPLANT
CARTRIDGE OIL MAESTRO DRILL (MISCELLANEOUS) ×1 IMPLANT
DECANTER SPIKE VIAL GLASS SM (MISCELLANEOUS) ×2 IMPLANT
DERMABOND ADVANCED (GAUZE/BANDAGES/DRESSINGS) ×1
DERMABOND ADVANCED .7 DNX12 (GAUZE/BANDAGES/DRESSINGS) ×1 IMPLANT
DIFFUSER DRILL AIR PNEUMATIC (MISCELLANEOUS) ×2 IMPLANT
DRAPE LAPAROTOMY 100X72X124 (DRAPES) ×2 IMPLANT
DRAPE MICROSCOPE LEICA (MISCELLANEOUS) ×2 IMPLANT
DRAPE SURG 17X23 STRL (DRAPES) ×2 IMPLANT
DRSG OPSITE POSTOP 4X6 (GAUZE/BANDAGES/DRESSINGS) ×4 IMPLANT
DURAPREP 26ML APPLICATOR (WOUND CARE) ×2 IMPLANT
ELECT REM PT RETURN 9FT ADLT (ELECTROSURGICAL) ×2
ELECTRODE REM PT RTRN 9FT ADLT (ELECTROSURGICAL) ×1 IMPLANT
GAUZE 4X4 16PLY ~~LOC~~+RFID DBL (SPONGE) ×2 IMPLANT
GAUZE SPONGE 4X4 12PLY STRL (GAUZE/BANDAGES/DRESSINGS) IMPLANT
GLOVE EXAM NITRILE XL STR (GLOVE) IMPLANT
GLOVE SURG LTX SZ6.5 (GLOVE) ×2 IMPLANT
GOWN STRL REUS W/ TWL LRG LVL3 (GOWN DISPOSABLE) ×2 IMPLANT
GOWN STRL REUS W/ TWL XL LVL3 (GOWN DISPOSABLE) IMPLANT
GOWN STRL REUS W/TWL 2XL LVL3 (GOWN DISPOSABLE) IMPLANT
GOWN STRL REUS W/TWL LRG LVL3 (GOWN DISPOSABLE) ×4
GOWN STRL REUS W/TWL XL LVL3 (GOWN DISPOSABLE)
KIT BASIN OR (CUSTOM PROCEDURE TRAY) ×2 IMPLANT
KIT TURNOVER KIT B (KITS) ×2 IMPLANT
NEEDLE HYPO 25X1 1.5 SAFETY (NEEDLE) ×2 IMPLANT
NEEDLE SPNL 18GX3.5 QUINCKE PK (NEEDLE) IMPLANT
NS IRRIG 1000ML POUR BTL (IV SOLUTION) ×2 IMPLANT
OIL CARTRIDGE MAESTRO DRILL (MISCELLANEOUS) ×2
PACK LAMINECTOMY NEURO (CUSTOM PROCEDURE TRAY) ×2 IMPLANT
PAD ARMBOARD 7.5X6 YLW CONV (MISCELLANEOUS) ×6 IMPLANT
SPONGE SURGIFOAM ABS GEL SZ50 (HEMOSTASIS) ×2 IMPLANT
SPONGE T-LAP 4X18 ~~LOC~~+RFID (SPONGE) IMPLANT
STRIP CLOSURE SKIN 1/2X4 (GAUZE/BANDAGES/DRESSINGS) IMPLANT
SUT VIC AB 0 CT1 18XCR BRD8 (SUTURE) ×3 IMPLANT
SUT VIC AB 0 CT1 8-18 (SUTURE) ×6
SUT VIC AB 2-0 CT1 18 (SUTURE) ×4 IMPLANT
SUT VIC AB 2-0 CT2 18 VCP726D (SUTURE) ×2 IMPLANT
SUT VIC AB 3-0 SH 8-18 (SUTURE) ×6 IMPLANT
TOWEL GREEN STERILE (TOWEL DISPOSABLE) ×2 IMPLANT
TOWEL GREEN STERILE FF (TOWEL DISPOSABLE) ×2 IMPLANT
WATER STERILE IRR 1000ML POUR (IV SOLUTION) ×2 IMPLANT

## 2020-10-14 NOTE — H&P (Signed)
BP (!) 200/72   Pulse 69   Temp 98.2 F (36.8 C) (Oral)   Resp 18   Wt 69.4 kg   SpO2 100%   BMI 27.98 kg/m  Mrs. [place] returns today.  On May 26, she underwent a thoracic decompression of thoracic stenosis secondary to myelopathy.  She presents today with about a month to 6-week history of pain that was into the right thigh.  But, then she developed significant pain in the right buttock, which also travels down inferiorly through the thigh and into the shin on the right side, for about the last 10 days.  There has been no preceding trauma.  There has been no unusual or any type of change in her environment.     VITAL SIGNS :  She is 157 pounds.  Pain is 9/10.  Her blood pressure last night, as she recorded it, was both numbers both systolic and it was 180/132.  Today, she is 190/80.  The pain is something which is real and which has not been present for that long.     IMAGING :  She has an MRI scheduled for August 5.  I am going to see if we can get that done sooner.  We will do it with and without contrast just to make sure nothing else is going on, but this does not sound like it is related to the thoracic procedure, only because it is higher and that was just a simple decompression.     PHYSICAL EXAMINATION :  She has 3 to 4+ reflexes at the knees and ankles.  Strength is good.  She is in pain, has an antalgic gait, and is using a cane and obviously is favoring the right lower extremity.     MEDICATIONS :  She is taking Hydrocodone with Acetaminophen.   The patient comes back with the thoracic MRI.  I just did not do enough.  I got right underneath the bone and took it at the disc space, but just underestimated how much more I needed and left maybe half a centimeter or centimeter of bone to completely clear that area of thoracic stenosis.  The other thing that she is talking about today is that she has a sciatic-like pain running from the right buttock down the front of the  thigh and into the shin on the right side.  This has nothing, I think, to do with the thoracic area; that I believe has to do with the numbness in the lower extremities.  But, she is congested at L4-5 and 5-1 in the lateral recess.  She, again, does not need hardware, does not need anything else and this is in no way can I explain the symptoms in her right lower extremity which she states started about 2 weeks after the operation with the thoracic region.  The thoracic region just needs to be cleared completely and, again, that is about removing the centimeter of bone and then the lumbar region needs a semi hemilaminectomy for decompression on the right side at 4-5 and 5-1.  Risks and benefits she is well attuned to, having just had the operation.  We will get this scheduled for this coming Thursday.  I expect her to be able to leave that night or the next day.     She does have full strength in the upper and lower extremities.  She uses a cane secondary to pain.  Gait is antalgic.  Her wound has healed well, no signs of  infection.

## 2020-10-14 NOTE — Anesthesia Postprocedure Evaluation (Signed)
Anesthesia Post Note  Patient: Cynthia Cochran  Procedure(s) Performed: Right Lumbar 4-5, Lumbar 5 Sacral 1 Lumbar and thoracic laminectomy (Spine Lumbar)     Anesthesia Post Evaluation No notable events documented.  Last Vitals:  Vitals:   10/14/20 1715 10/14/20 1732  BP: (!) 196/97 (!) 194/87  Pulse: 81 71  Resp: 12 11  Temp: (!) 36.3 C   SpO2: 98% 97%    Last Pain:  Vitals:   10/14/20 1715  TempSrc:   PainSc: 0-No pain                 Katalea Ucci

## 2020-10-14 NOTE — Anesthesia Postprocedure Evaluation (Signed)
Anesthesia Post Note  Patient: Cynthia Cochran  Procedure(s) Performed: Right Lumbar 4-5, Lumbar 5 Sacral 1 Lumbar and thoracic laminectomy (Spine Lumbar)     Patient location during evaluation: PACU Anesthesia Type: General Level of consciousness: awake Pain management: pain level controlled Vital Signs Assessment: post-procedure vital signs reviewed and stable Respiratory status: spontaneous breathing Cardiovascular status: stable Postop Assessment: no apparent nausea or vomiting Anesthetic complications: no   No notable events documented.  Last Vitals:  Vitals:   10/14/20 1715 10/14/20 1732  BP: (!) 196/97 (!) 194/87  Pulse: 81 71  Resp: 12 11  Temp: (!) 36.3 C   SpO2: 98% 97%    Last Pain:  Vitals:   10/14/20 1715  TempSrc:   PainSc: 0-No pain                 Breelyn Icard

## 2020-10-14 NOTE — Transfer of Care (Signed)
Immediate Anesthesia Transfer of Care Note  Patient: Cynthia Cochran  Procedure(s) Performed: Right Lumbar 4-5, Lumbar 5 Sacral 1 Lumbar and thoracic laminectomy (Spine Lumbar)  Patient Location: PACU  Anesthesia Type:General  Level of Consciousness: drowsy  Airway & Oxygen Therapy: Patient Spontanous Breathing and Patient connected to nasal cannula oxygen  Post-op Assessment: Report given to RN and Post -op Vital signs reviewed and stable  Post vital signs: Reviewed and stable  Last Vitals:  Vitals Value Taken Time  BP 200/88 10/14/20 1717  Temp 36.3 C 10/14/20 1715  Pulse 87 10/14/20 1721  Resp 10 10/14/20 1721  SpO2 98 % 10/14/20 1721  Vitals shown include unvalidated device data.  Last Pain:  Vitals:   10/14/20 1228  TempSrc:   PainSc: 10-Worst pain ever         Complications: No notable events documented.

## 2020-10-14 NOTE — Anesthesia Preprocedure Evaluation (Signed)
Anesthesia Evaluation  Patient identified by MRN, date of birth, ID band Patient awake    Reviewed: Allergy & Precautions, NPO status , Patient's Chart, lab work & pertinent test results  Airway Mallampati: II  TM Distance: >3 FB     Dental   Pulmonary    breath sounds clear to auscultation       Cardiovascular negative cardio ROS   Rhythm:Regular Rate:Normal     Neuro/Psych  Neuromuscular disease    GI/Hepatic Neg liver ROS, PUD, GERD  ,  Endo/Other    Renal/GU negative Renal ROS     Musculoskeletal  (+) Arthritis ,   Abdominal   Peds  Hematology   Anesthesia Other Findings   Reproductive/Obstetrics                             Anesthesia Physical Anesthesia Plan  ASA: 3  Anesthesia Plan: General   Post-op Pain Management:    Induction: Intravenous  PONV Risk Score and Plan: 3 and Ondansetron, Dexamethasone and Midazolam  Airway Management Planned: Oral ETT and Video Laryngoscope Planned  Additional Equipment:   Intra-op Plan:   Post-operative Plan:   Informed Consent: I have reviewed the patients History and Physical, chart, labs and discussed the procedure including the risks, benefits and alternatives for the proposed anesthesia with the patient or authorized representative who has indicated his/her understanding and acceptance.     Dental advisory given  Plan Discussed with: CRNA and Anesthesiologist  Anesthesia Plan Comments:         Anesthesia Quick Evaluation

## 2020-10-14 NOTE — Op Note (Signed)
10/14/2020  7:46 PM  PATIENT:  Cynthia Cochran  70 y.o. female With pain in the right lower extremity, and compression at T11 PRE-OPERATIVE DIAGNOSIS:  Lumbar facet arthropathy right L4/5,5/1, Thoracic Stenosis T11  POST-OPERATIVE DIAGNOSIS:  Lumbar facet arthropathy, Thoracic Stenosis  PROCEDURE:  Procedure(s): Right Lumbar 4-5, Lumbar 5 Sacral 1 Lumbar and thoracic laminectomy  SURGEON: Surgeon(s): Coletta Memos, MD  ASSISTANTS:none  ANESTHESIA:   general  EBL:  No intake/output data recorded.  BLOOD ADMINISTERED:none  CELL SAVER GIVEN:none  COUNT:per nursing  DRAINS: none   SPECIMEN:  No Specimen  DICTATION: ERYN MARANDOLA was taken to the operating room, intubated, and placed under a general anesthetic without difficulty. She was positioned prone on a Wilson frame. All pressure points were properly padded. Her thoracic incision, and lumbar region was prepped and draped in a sterile manner. I infiltrated both planned incisions with lidocaine. I opened the thoracic incision and dissected sharply and with monopolar cautery to expose the remnant of the T11 spinous process and lamina. I used curettes to remove soft tissue and create planes for dissection. I used the drill and the Kerrison punches to remove the lamina completely, and the build up of soft tissue around the left 10/11 facet. The thecal sac and canal were well decompressed. I irrigated then packed the wound.  I opened the lumbar incision, dissected sharply to the thoracolumbar fascia, then with cautery exposed the lamina of L4, L5, and S1. I confirmed my location with xray.  I performed semihemilaminectomies of L4, and L5. I removed the ligamentum flavum to decompress the spinal canal. I removed bone to decompress the lateral recess and foramina at each level. The L4, L5, and S1 roots were well decompressed.  I irrigated then closed both incisions.  I used vicryl sutures to approximate the thoracolumbar fascia at each  site, then the subcutaneous and subcuticular planes. I used dermabond at each incision, and occlusive bandages for each.   PLAN OF CARE: Admit for overnight observation  PATIENT DISPOSITION:  PACU - hemodynamically stable.   Delay start of Pharmacological VTE agent (>24hrs) due to surgical blood loss or risk of bleeding:  yes

## 2020-10-14 NOTE — Anesthesia Procedure Notes (Addendum)
Procedure Name: Intubation Date/Time: 10/14/2020 2:51 PM Performed by: Eligha Bridegroom, CRNA Pre-anesthesia Checklist: Patient identified, Emergency Drugs available, Suction available, Patient being monitored and Timeout performed Patient Re-evaluated:Patient Re-evaluated prior to induction Oxygen Delivery Method: Circle system utilized Preoxygenation: Pre-oxygenation with 100% oxygen Induction Type: IV induction Ventilation: Mask ventilation without difficulty Laryngoscope Size: Mac Grade View: Grade I Tube size: 7.0 mm Number of attempts: 1 Airway Equipment and Method: Stylet Placement Confirmation: ETT inserted through vocal cords under direct vision, positive ETCO2 and breath sounds checked- equal and bilateral Secured at: 21 cm Tube secured with: Tape Dental Injury: Teeth and Oropharynx as per pre-operative assessment

## 2020-10-15 ENCOUNTER — Encounter (HOSPITAL_COMMUNITY): Payer: Self-pay | Admitting: Neurosurgery

## 2020-10-15 DIAGNOSIS — M4804 Spinal stenosis, thoracic region: Secondary | ICD-10-CM | POA: Diagnosis not present

## 2020-10-15 DIAGNOSIS — M48061 Spinal stenosis, lumbar region without neurogenic claudication: Secondary | ICD-10-CM | POA: Diagnosis not present

## 2020-10-15 DIAGNOSIS — M4696 Unspecified inflammatory spondylopathy, lumbar region: Secondary | ICD-10-CM | POA: Diagnosis not present

## 2020-10-15 DIAGNOSIS — Z20822 Contact with and (suspected) exposure to covid-19: Secondary | ICD-10-CM | POA: Diagnosis not present

## 2020-10-15 NOTE — Evaluation (Signed)
Physical Therapy Evaluation Patient Details Name: Cynthia Cochran MRN: [id number] DOB: Feb 25, 1951 Today's Date: 10/15/2020   History of Present Illness  The pt is a 70 yo female presenting 8/4 for L4-5 and L5-S1 laminectomy as well as T10-11 laminecotmy. PMH includes: prior thoracic surgery, arthritis.   Clinical Impression  Pt in bed upon arrival of PT, agreeable to evaluation at this time. Prior to admission the pt was mobilizing with use of SPC and frequently holding furniture for balance, but has been mostly limited to within-home distances following last surgery. The pt now presents with limitations in functional mobility, strength, power, endurance, stability, and safety awareness due to above dx, and will continue to benefit from skilled PT to address these deficits. The pt was educated in spinal precautions and safety at the start of session, but needed frequent reminders and re-education to recall techniques with any movement. The pt required minA for all bed mobility as well as minG for OOB transfers. The pt currently utilizes a slowed gait speed with minimal stride length, decreased coordination of step placement, narrow BOS, and mild hyperextension bilaterally as well as decreased DF of LLE. The pt is at hish risk for falls due to decreased safety awareness and deficits above. Will continue to benefit from skilled PT acutely as well as max HHPT following d/c to improve safety with mobility after d/c.      Follow Up Recommendations Home health PT;Supervision for mobility/OOB    Equipment Recommendations  None recommended by PT (pt well equipped)    Recommendations for Other Services       Precautions / Restrictions Precautions Precautions: Back;Fall Precaution Booklet Issued: Yes (comment) Precaution Comments: pt with limited ability to recall precautions without max cues Required Braces or Orthoses:  (no brace) Restrictions Weight Bearing Restrictions: No      Mobility  Bed  Mobility Overal bed mobility: Needs Assistance Bed Mobility: Rolling;Sidelying to Sit;Sit to Sidelying Rolling: Min assist Sidelying to sit: Min assist     Sit to sidelying: Min assist General bed mobility comments: minA to manage spinal precautions    Transfers Overall transfer level: Needs assistance Equipment used: Rolling walker (2 wheeled) Transfers: Sit to/from Stand Sit to Stand: Min guard         General transfer comment: minG with increased time and max cues to power up to standing  Ambulation/Gait Ambulation/Gait assistance: Min guard Gait Distance (Feet): 150 Feet Assistive device: Rolling walker (2 wheeled) Gait Pattern/deviations: Step-to pattern;Step-through pattern;Decreased stride length;Decreased dorsiflexion - left;Trunk flexed;Narrow base of support Gait velocity: 0.25 m/s Gait velocity interpretation: <1.31 ft/sec, indicative of household ambulator General Gait Details: narrow BOS with mild hyperextension bilaterally, pt with minimal stride length and frequent toe dragging with LLE      Balance Overall balance assessment: Needs assistance Sitting-balance support: No upper extremity supported;Feet supported Sitting balance-Leahy Scale: Good     Standing balance support: Bilateral upper extremity supported;During functional activity Standing balance-Leahy Scale: Poor Standing balance comment: reliant on BUE support                             Pertinent Vitals/Pain Pain Assessment: Faces Faces Pain Scale: Hurts little more Pain Location: incision Pain Descriptors / Indicators: Discomfort;Dull;Grimacing Pain Intervention(s): Monitored during session;Repositioned;Ice applied    Home Living Family/patient expects to be discharged to:: Private residence Living Arrangements: Alone Available Help at Discharge: Family;Friend(s);Available PRN/intermittently Type of Home: House Home Access: Stairs to enter Entrance Stairs-Rails:  None Entrance Stairs-Number of Steps: 1 Home Layout: One level Home Equipment: Cane - single point;Adaptive equipment;Walker - 2 wheels;Shower seat - built in;Grab bars - tub/shower      Prior Function Level of Independence: Independent with assistive device(s)         Comments: pt reports she is mobilizing independently with use of cane and furniture walking. relying on niece for driving errands     Hand Dominance   Dominant Hand: Right    Extremity/Trunk Assessment   Upper Extremity Assessment Upper Extremity Assessment: Defer to OT evaluation    Lower Extremity Assessment Lower Extremity Assessment: RLE deficits/detail;LLE deficits/detail RLE Deficits / Details: pt reports diffuse numbness from thigh to foot, grossly 4/5 RLE Sensation: decreased light touch RLE Coordination: decreased fine motor LLE Deficits / Details: pt reports diffuse numbness frtom thigh to foot, grossly 3/5, especially with knee flexion LLE Sensation: decreased light touch LLE Coordination: decreased fine motor    Cervical / Trunk Assessment Cervical / Trunk Assessment: Other exceptions Cervical / Trunk Exceptions: s/p spine surgery  Communication   Communication: No difficulties  Cognition Arousal/Alertness: Awake/alert Behavior During Therapy: Impulsive Overall Cognitive Status: Impaired/Different from baseline Area of Impairment: Following commands;Safety/judgement;Awareness;Problem solving;Memory                     Memory: Decreased recall of precautions;Decreased short-term memory Following Commands: Follows one step commands with increased time Safety/Judgement: Decreased awareness of safety;Decreased awareness of deficits Awareness: Intellectual Problem Solving: Slow processing;Difficulty sequencing;Decreased initiation General Comments: pt with mulitple episodes of decreased memory thorugh session, needing max cues as well as physical assist at times to safely mobilize and  maintain precautions. limited insight to deficits and need for assist      General Comments General comments (skin integrity, edema, etc.): VSS on RA    Exercises     Assessment/Plan    PT Assessment Patient needs continued PT services  PT Problem List Decreased strength;Decreased range of motion;Decreased activity tolerance;Decreased balance;Decreased mobility;Decreased coordination;Decreased cognition;Decreased safety awareness;Pain       PT Treatment Interventions DME instruction;Gait training;Stair training;Functional mobility training;Therapeutic activities;Therapeutic exercise;Balance training;Patient/family education    PT Goals (Current goals can be found in the Care Plan section)  Acute Rehab PT Goals Patient Stated Goal: return home PT Goal Formulation: With patient Time For Goal Achievement: 10/29/20 Potential to Achieve Goals: Good    Frequency Min 5X/week   Barriers to discharge Decreased caregiver support         AM-PAC PT "6 Clicks" Mobility  Outcome Measure Help needed turning from your back to your side while in a flat bed without using bedrails?: A Little Help needed moving from lying on your back to sitting on the side of a flat bed without using bedrails?: A Little Help needed moving to and from a bed to a chair (including a wheelchair)?: A Little Help needed standing up from a chair using your arms (e.g., wheelchair or bedside chair)?: A Little Help needed to walk in hospital room?: A Little Help needed climbing 3-5 steps with a railing? : A Little 6 Click Score: 18    End of Session Equipment Utilized During Treatment: Gait belt Activity Tolerance: Patient tolerated treatment well;Patient limited by fatigue Patient left: in bed;with call bell/phone within reach Nurse Communication: Mobility status PT Visit Diagnosis: Other abnormalities of gait and mobility (R26.89);Muscle weakness (generalized) (M62.81);Pain    Time: 6734-1937 PT Time  Calculation (min) (ACUTE ONLY): 40 min   Charges:  PT Evaluation $PT Eval Moderate Complexity: 1 Mod PT Treatments $Gait Training: 23-37 mins        Lazarus Gowda, PT, DPT   Acute Rehabilitation Department Pager #: [phone]   Cynthia Cochran 10/15/2020, 8:55 AM

## 2020-10-15 NOTE — Progress Notes (Signed)
Occupational Therapy Evaluation  PTA pt living at home with at least 4 falls after last back surgery due to legs "giving out" or "tripping over" her L foot. Pt with BLE abnormal sensation and L LE weakness - unable to achieve dorsiflexion on the L. Would benefit from follow up with orthotist for assessment of L AFO. Recommend use of RW at all times due to multiple falls. Given history of falls and 2 back surgeries within 3 months, recommend follow up with HHOT to increase safety and independence at home and reduce risk of falls. Will most likely need outpt PT at the neuro outpt center after [facility] when able to participate in more rigorous strengthening. Pt is asking about going on a cruise in September - recommend she speak to Dr Franky Macho about her ability to do this safely. Will follow acutely.     10/15/20 0900  OT Visit Information  Last OT Received On 10/15/20  Assistance Needed +1  History of Present Illness The pt is a 70 yo female presenting 8/4 for L4-5 and L5-S1 laminectomy as well as T10-11 laminecotmy. PMH includes: prior thoracic surgery, arthritis.  Precautions  Precautions Back;Fall  Precaution Booklet Issued Yes (comment)  Precaution Comments pt with limited ability to recall precautions  Required Braces or Orthoses  (no brace)  Home Living  Family/patient expects to be discharged to: Private residence  Living Arrangements Alone  Available Help at Discharge Family;Friend(s);Available PRN/intermittently  Type of Home House  Home Access Stairs to enter  Entrance Stairs-Number of Steps 1  Entrance Stairs-Rails None  Home Layout One level  Bathroom Shower/Tub Walk-in shower  Bathroom Toilet Handicapped height  Bathroom Accessibility Yes  Home Equipment [place] - single point;Adaptive equipment;Walker - 2 wheels;Shower seat - built in;Grab bars - tub/shower;Hand held Retail buyer Reacher;Long-handled sponge  Prior Function  Level of Independence Independent with  assistive device(s)  Comments pt reports she is mobilizing independently with use of cane and furniture walking. relying on niece for driving errands; has fallen 4 times since last surgery  Communication  Communication No difficulties  Pain Assessment  Faces Pain Scale 4  Pain Location incision  Pain Descriptors / Indicators Discomfort;Dull;Grimacing  Pain Intervention(s) Limited activity within patient's tolerance  Cognition  Arousal/Alertness Awake/alert  Behavior During Therapy Impulsive  Overall Cognitive Status No family/caregiver present to determine baseline cognitive functioning  Area of Impairment Safety/judgement;Awareness;Problem solving;Attention;Memory  Memory Decreased recall of precautions;Decreased short-term memory  Following Commands Follows one step commands consistently  Safety/Judgement Decreased awareness of safety;Decreased awareness of deficits  Awareness Emergent  Problem Solving Slow processing  Upper Extremity Assessment  Upper Extremity Assessment Overall WFL for tasks assessed (bursitis L shoulder)  Lower Extremity Assessment  RLE Deficits / Details pt reports diffuse numbness from thigh to foot, grossly 4/5  RLE Sensation decreased light touch  RLE Coordination decreased fine motor  LLE Deficits / Details pt reports diffuse numbness frtom thigh to foot, grossly 3/5, especially with knee flexion (L footdrop)  LLE Sensation decreased light touch  LLE Coordination decreased fine motor  Cervical / Trunk Assessment  Cervical / Trunk Assessment Other exceptions  Cervical / Trunk Exceptions s/p spine surgery  ADL  Overall ADL's  Needs assistance/impaired  Functional mobility during ADLs Min guard;Rolling walker;Cueing for safety  General ADL Comments Educated pt on compensatory strategies for LB ADL following back precatuions. Pt able to complete figure four positioning however has a reacher to use as needed. Requires min vc to maintain precautions.  Bed  Mobility  Overal bed mobility Needs Assistance  Bed Mobility Rolling;Sidelying to Sit;Sit to Sidelying  Sidelying to sit Min guard  General bed mobility comments pt used stronger RLE to move LLE off bed  Transfers  Overall transfer level Needs assistance  Equipment used Rolling walker (2 wheeled)  Transfers Sit to/from Stand  Sit to Stand Min guard  General transfer comment minG with VC for hand placement  Balance  Overall balance assessment Needs assistance;History of Falls (4 falls since May)  Sitting-balance support No upper extremity supported;Feet supported  Sitting balance-Leahy Scale Good  Standing balance support Bilateral upper extremity supported;During functional activity  Standing balance-Leahy Scale Poor  Standing balance comment reliant on BUE support  OT - End of Session  Activity Tolerance Patient tolerated treatment well  Patient left in bed;with call bell/phone within reach  Nurse Communication Mobility status;Other (comment) (DC recommendations)  OT Assessment  OT Recommendation/Assessment Patient needs continued OT Services  OT Visit Diagnosis Repeated falls (R29.6);Unsteadiness on feet (R26.81);Other abnormalities of gait and mobility (R26.89);Muscle weakness (generalized) (M62.81);Pain  Pain - part of body  (back)  OT Problem List Decreased strength;Decreased range of motion;Impaired balance (sitting and/or standing);Decreased safety awareness;Decreased knowledge of use of DME or AE;Impaired tone;Pain  OT Plan  OT Frequency (ACUTE ONLY) Min 2X/week  OT Treatment/Interventions (ACUTE ONLY) Self-care/ADL training;DME and/or AE instruction;Therapeutic activities;Patient/family education;Balance training  AM-PAC OT "6 Clicks" Daily Activity Outcome Measure (Version 2)  Help from another person eating meals? 4  Help from another person taking care of personal grooming? 3  Help from another person toileting, which includes using toliet, bedpan, or urinal? 3  Help  from another person bathing (including washing, rinsing, drying)? 3  Help from another person to put on and taking off regular upper body clothing? 3  Help from another person to put on and taking off regular lower body clothing? 3  6 Click Score 19  Progressive Mobility  What is the highest level of mobility based on the progressive mobility assessment? Level 5 (Walks with assist in room/hall) - Balance while stepping forward/back and can walk in room with assist - Complete  Mobility Ambulated with assistance in room;Out of bed to chair with meals  OT Recommendation  Recommendations for Other Services Other (comment) (follow up with [facility] for assessment of AFO due to L footdrop)  Follow Up Recommendations Home health OT;Other (comment) (Using RW with mobility at all times)  OT Equipment None recommended by OT  Individuals Consulted  Consulted and Agree with Results and Recommendations Patient  Acute Rehab OT Goals  Patient Stated Goal return home  OT Goal Formulation With patient  Time For Goal Achievement 10/29/20  Potential to Achieve Goals Good  OT Time Calculation  OT Start Time (ACUTE ONLY) 0849  OT Stop Time (ACUTE ONLY) 0918  OT Time Calculation (min) 29 min  OT General Charges  $OT Visit 1 Visit  OT Evaluation  $OT Eval Moderate Complexity 1 Mod  OT Treatments  $Self Care/Home Management  8-22 mins  Written Expression  Dominant Hand Right  Luisa Dago, OT/L   Acute OT Clinical Specialist Acute Rehabilitation Services Pager [phone] Office [phone]

## 2020-10-15 NOTE — Progress Notes (Signed)
Patient called staff from bathroom for assistance, Stating "I am on the floor". Staff assisted patient off the floor and assessed with no sign of bruising and injury noted. Family notified and MD notified with no new orders. Will continue to monitor

## 2020-10-15 NOTE — TOC Initial Note (Addendum)
Transition of Care [facility]) - Initial/Assessment Note    Patient Details  Name: Cynthia Cochran MRN: [id number] Date of Birth: 1950-09-16  Transition of Care [facility]) CM/SW Contact:    Joanne Chars, LCSW Phone Number: 10/15/2020, 10:52 AM  Clinical Narrative:  CSW met with pt regarding recommendation for [facility].  Pt agreeable, choice document given.  Pt interested in [place].  Permission given to speak with niece Traci.  Pt reports she lives alone but Tressia Miners is currently staying "in my front yard" in a camper and she has another relative who lives next door, both available for any support she needs.  Pt is vaccinated for Covid with one booster.  PCP in place.     1155: [place] at [facility] accepts.                Expected Discharge Plan: [place] Barriers to Discharge: No Barriers Identified   Patient Goals and CMS Choice Patient states their goals for this hospitalization and ongoing recovery are:: walking without pain CMS Medicare.gov Compare Post Acute Care list provided to:: Patient Choice offered to / list presented to : Patient  Expected Discharge Plan and Services Expected Discharge Plan: [place] In-house Referral: Clinical Social Work   Post Acute Care Choice: [place] arrangements for the past 2 months: [place]                 DME Arranged: N/A (DME through [facility] staff)                    Prior Living Arrangements/Services Living arrangements for the past 2 months: Single Family Home Lives with:: Self (pt has two relatives, one next door, one staying in a camper on pt property, both available to support.) Patient language and need for interpreter reviewed:: Yes Do you feel safe going back to the place where you live?: Yes      Need for Family Participation in Patient Care: Yes (Comment) Care giver support system in place?: Yes (comment) Current home services: Other (comment) (none) Criminal  Activity/Legal Involvement Pertinent to Current Situation/Hospitalization: No - Comment as needed  Activities of Daily Living Home Assistive Devices/Equipment: Eyeglasses, Cane (specify quad or straight), Grab bars in shower, Built-in shower seat, Walker (specify type) ADL Screening (condition at time of admission) Patient's cognitive ability adequate to safely complete daily activities?: Yes Is the patient deaf or have difficulty hearing?: No Does the patient have difficulty seeing, even when wearing glasses/contacts?: No Does the patient have difficulty concentrating, remembering, or making decisions?: No Patient able to express need for assistance with ADLs?: Yes Does the patient have difficulty dressing or bathing?: No Independently performs ADLs?: Yes (appropriate for developmental age) Does the patient have difficulty walking or climbing stairs?: Yes Weakness of Legs: Both Weakness of Arms/Hands: None  Permission Sought/Granted Permission sought to share information with : Family Supports Permission granted to share information with : Yes, Verbal Permission Granted  Share Information with NAME: niece Traci           Emotional Assessment Appearance:: Appears stated age Attitude/Demeanor/Rapport: Engaged Affect (typically observed): Appropriate, Pleasant Orientation: : Oriented to Self, Oriented to Place, Oriented to  Time, Oriented to Situation Alcohol / Substance Use: Not Applicable Psych Involvement: No (comment)  Admission diagnosis:  Thoracic spinal stenosis [M48.04] Patient Active Problem List   Diagnosis Date Noted   Constipation 08/12/2020   Thoracic spinal stenosis 08/05/2020  Gait disturbance 11/04/2018   Numbness and tingling 11/04/2018   Lumbar radiculopathy 11/04/2018   Cervical stenosis of spinal canal 11/04/2018   PUD (peptic ulcer disease) 05/10/2018   Anemia 05/10/2018   Special screening for malignant neoplasms, colon 05/10/2018   Abdominal pain  04/07/2018   Hypokalemia 04/07/2018   Osteoarthritis 04/07/2018   Elevated blood pressure reading 04/07/2018   Family hx of colon cancer 02/27/2018   Screening for colorectal cancer 11/06/2017   Encounter for well woman exam with routine gynecological exam 11/06/2017   High cholesterol 04/25/2016   Dysphagia 04/25/2016   Esophageal dysphagia 04/25/2016   Mixed stress and urge urinary incontinence 09/09/2015   PCP:  Celene Squibb, MD Pharmacy:   [facility] [address] [place] [address] Phone: [phone] Fax: [phone]     Social Determinants of Health (SDOH) Interventions    Readmission Risk Interventions No flowsheet data found.

## 2020-10-15 NOTE — Progress Notes (Signed)
Patient ID: Cynthia Cochran, female   DOB: Jul 03, 1950, 70 y.o.   MRN: [id number] BP (!) 145/79 (BP Location: Left Arm)   Pulse 72   Temp 98.1 F (36.7 C) (Oral)   Resp 18   Wt 69.4 kg   SpO2 100%   BMI 27.98 kg/m  Alert and oriented, moving all extremities well Wounds are clean, dry, no signs of infection

## 2020-10-16 DIAGNOSIS — Z20822 Contact with and (suspected) exposure to covid-19: Secondary | ICD-10-CM | POA: Diagnosis not present

## 2020-10-16 DIAGNOSIS — M48061 Spinal stenosis, lumbar region without neurogenic claudication: Secondary | ICD-10-CM | POA: Diagnosis not present

## 2020-10-16 DIAGNOSIS — M4804 Spinal stenosis, thoracic region: Secondary | ICD-10-CM | POA: Diagnosis not present

## 2020-10-16 DIAGNOSIS — M4696 Unspecified inflammatory spondylopathy, lumbar region: Secondary | ICD-10-CM | POA: Diagnosis not present

## 2020-10-16 MED ORDER — OXYCODONE HCL 5 MG PO TABS: 5.0000 mg | ORAL_TABLET | ORAL | 0 refills | Status: DC | PRN

## 2020-10-16 MED ORDER — ALUM & MAG HYDROXIDE-SIMETH 200-200-20 MG/5ML PO SUSP
30.0000 mL | Freq: Four times a day (QID) | ORAL | Status: DC | PRN
  Administered 2020-10-16: 30 mL via ORAL
  Filled 2020-10-16: qty 30

## 2020-10-16 MED ORDER — TIZANIDINE HCL 4 MG PO TABS: 4.0000 mg | ORAL_TABLET | Freq: Four times a day (QID) | ORAL | 0 refills | Status: DC | PRN

## 2020-10-16 NOTE — Progress Notes (Signed)
Patient alert and oriented, voiding adequately, skin clean, dry and intact without evidence of skin break down, or symptoms of complications - no redness or edema noted, only slight tenderness at site.  Patient states pain is manageable at time of discharge. Patient has an appointment with MD in 3 weeks 

## 2020-10-16 NOTE — Discharge Summary (Signed)
Physician Discharge Summary  Patient ID: Cynthia Cochran MRN: [id number] DOB/AGE: [birth year] 70 y.o.  Admit date: 10/14/2020 Discharge date: 10/16/2020  Admission Diagnoses:thoracic t11 myelopathy, lumbar stenosis L4/5,5/1 right   Discharge Diagnoses:  Active Problems:   Thoracic spinal stenosis   Discharged Condition: good  Hospital Course: Cynthia Cochran is a 70 y.o. female whom was taken to the operating room for an uncomplicated lumbar and thoracic laminectomy for decompression. She is slightly weaker by report, but moves all extremities, and can walk. Feels weak in left dorsiflexors, immediately post op felt weak in right lower extremity.  Wounds are clean, dry, no signs of infection .  Treatments: surgery: tll laminectomy, L4/5, 5/1 hemilaminectomies  Discharge Exam: Blood pressure 135/71, pulse 63, temperature 98.7 F (37.1 C), temperature source Oral, resp. rate 18, weight 69.4 kg, SpO2 98 %. Alert and oriented x 4, speech clear Normal strength except left dorsiflexors. Will have home health pt, ot  Disposition: Discharge disposition: 01-Home or Self Care      Lumbar facet arthropathy, Thoracic Stenosis  Allergies as of 10/16/2020       Reactions   Gabapentin Other (See Comments)   Feet went numb   Lyrica [pregabalin] Other (See Comments)   Feet numb   Mobic [meloxicam] Other (See Comments)   Irritates stomach   Nsaids Other (See Comments)   Irritates stomach - MOBIC         Medication List     TAKE these medications    atorvastatin 80 MG tablet Commonly known as: LIPITOR Take 80 mg by mouth at bedtime.   cyclobenzaprine 5 MG tablet Commonly known as: FLEXERIL Take 1 tablet (5 mg total) by mouth at bedtime.   diclofenac Sodium 1 % Gel Commonly known as: VOLTAREN Apply 2 g topically daily as needed (pain).   docusate sodium 50 MG capsule Commonly known as: COLACE Take 50 mg by mouth daily.   DULoxetine 30 MG capsule Commonly known as:  CYMBALTA Take 30 mg by mouth daily.   escitalopram 10 MG tablet Commonly known as: LEXAPRO TAKE 1 TABLET BY MOUTH DAILY. INCREASE FROM 5 MG What changed:  how much to take how to take this when to take this   ezetimibe 10 MG tablet Commonly known as: ZETIA Take 10 mg by mouth at bedtime.   HYDROcodone-acetaminophen 5-325 MG tablet Commonly known as: NORCO/VICODIN Take 1 tablet by mouth 3 (three) times daily as needed for pain.   LORazepam 2 MG tablet Commonly known as: ATIVAN Take 2 mg by mouth at bedtime.   losartan 25 MG tablet Commonly known as: COZAAR Take 25 mg by mouth daily.   oxybutynin 5 MG 24 hr tablet Commonly known as: DITROPAN-XL Take 5 mg by mouth daily.   oxyCODONE 5 MG immediate release tablet Commonly known as: Oxy IR/ROXICODONE Take 1 tablet (5 mg total) by mouth every 3 (three) hours as needed for moderate pain ((score 4 to 6)).   tiZANidine 4 MG tablet Commonly known as: ZANAFLEX Take 1 tablet (4 mg total) by mouth every 6 (six) hours as needed for muscle spasms.        Follow-up Information     [facility] Follow up.   Why: [facility] will contact you to schedule your first home visit. Contact information: P: [phone]                Signed: Coletta Memos 10/16/2020, 9:51 AM

## 2020-10-16 NOTE — Plan of Care (Signed)
NA

## 2020-10-16 NOTE — Progress Notes (Addendum)
Occupational Therapy Treatment Patient Details Name: Cynthia Cochran MRN: [id number] DOB: 06-16-1950 Today's Date: 10/16/2020    History of present illness The pt is a 70 yo female presenting 8/4 for L4-5 and L5-S1 laminectomy as well as T10-11 laminecotmy. PMH includes: prior thoracic surgery, arthritis.   OT comments  Pt presented sitting EOB. Pt completed dressing with no AE but required min guard for LE ADLS and supervision to set up with UE ADLs due to pt needing max cues to follow precautions. Pt completed ambulation with RW and min guard due to safety. Pt was educated about compensations on transporting items as they attempted in session to carry items and ambulate. Pt currently with functional limitations due to the deficits listed below (see OT Problem List).  Pt will benefit from skilled OT to increase their safety and independence with ADL and functional mobility for ADL to facilitate discharge to venue listed below.    Follow Up Recommendations  Home health OT;Supervision/Assistance - 24 hour    Equipment Recommendations  None recommended by OT    Recommendations for Other Services Other (comment)    Precautions / Restrictions Precautions Precautions: Back;Fall Precaution Booklet Issued: No Precaution Comments: pt has booklet but unable to follow precautions in session even with positioning of equipment       Mobility Bed Mobility Overal bed mobility:  (presented sitting EOB)                  Transfers Overall transfer level: Needs assistance Equipment used: Rolling walker (2 wheeled) Transfers: Sit to/from Stand Sit to Stand: Min guard         General transfer comment: cues on hand placement and not pulling onto walker    Balance Overall balance assessment: Needs assistance;History of Falls Sitting-balance support: No upper extremity supported;Feet supported Sitting balance-Leahy Scale: Good     Standing balance support: Bilateral upper extremity  supported;During functional activity Standing balance-Leahy Scale: Poor Standing balance comment: reliant on BUE support                           ADL either performed or assessed with clinical judgement   ADL Overall ADL's : Needs assistance/impaired Eating/Feeding: Sitting;Supervision/ safety   Grooming: Oral care;Set up;Sitting   Upper Body Bathing: Set up;Sitting   Lower Body Bathing: Min guard;Cueing for safety;Cueing for sequencing;Sit to/from stand   Upper Body Dressing : Set up;Cueing for sequencing;Cueing for safety;Sitting   Lower Body Dressing: Min guard;Cueing for safety;Cueing for sequencing;Sit to/from stand   Toilet Transfer: Min guard;Cueing for safety;Cueing for sequencing;Regular Toilet;Grab bars;RW   Toileting- Architect and Hygiene: Min guard;Cueing for safety;Cueing for sequencing;Sit to/from stand       Functional mobility during ADLs: Min guard;Rolling walker;Cueing for safety General ADL Comments: Pt was able to complete LE ADL with figure four but requires cues to maintain position of back     Vision       Perception     Praxis      Cognition Arousal/Alertness: Awake/alert Behavior During Therapy: Impulsive Overall Cognitive Status: No family/caregiver present to determine baseline cognitive functioning Area of Impairment: Safety/judgement;Awareness;Problem solving;Attention;Memory                     Memory: Decreased recall of precautions Following Commands: Follows one step commands consistently Safety/Judgement: Decreased awareness of safety;Decreased awareness of deficits Awareness: Emergent Problem Solving: Slow processing General Comments: pt requires repeated cues on completion of  activity for following precuations and easily distracted        Exercises     Shoulder Instructions       General Comments      Pertinent Vitals/ Pain       Pain Assessment: Faces Faces Pain Scale: Hurts little  more Pain Location: incision Pain Descriptors / Indicators: Operative site guarding Pain Intervention(s): Limited activity within patient's tolerance  Home Living                                          Prior Functioning/Environment              Frequency  Min 2X/week        Progress Toward Goals  OT Goals(current goals can now be found in the care plan section)  Progress towards OT goals: Progressing toward goals  Acute Rehab OT Goals Patient Stated Goal: return home OT Goal Formulation: With patient Time For Goal Achievement: 10/29/20 Potential to Achieve Goals: Good ADL Goals Pt Will Perform Lower Body Bathing: with modified independence;sit to/from stand;with adaptive equipment Pt Will Perform Lower Body Dressing: with modified independence;sit to/from stand Pt Will Transfer to Toilet: with modified independence;ambulating Pt Will Perform Toileting - Clothing Manipulation and hygiene: with modified independence;sit to/from stand Additional ADL Goal #1: Pt will independently incorporate 3 back precautions into ADL tasks and mobility  Plan Discharge plan remains appropriate    Co-evaluation                 AM-PAC OT "6 Clicks" Daily Activity     Outcome Measure   Help from another person eating meals?: None Help from another person taking care of personal grooming?: A Little Help from another person toileting, which includes using toliet, bedpan, or urinal?: A Little Help from another person bathing (including washing, rinsing, drying)?: A Little Help from another person to put on and taking off regular upper body clothing?: A Little Help from another person to put on and taking off regular lower body clothing?: A Little 6 Click Score: 19    End of Session Equipment Utilized During Treatment: Gait belt;Rolling walker  OT Visit Diagnosis: Repeated falls (R29.6);Unsteadiness on feet (R26.81);Other abnormalities of gait and mobility  (R26.89);Muscle weakness (generalized) (M62.81);Pain Pain - part of body:  (back)   Activity Tolerance Patient tolerated treatment well   Patient Left in bed;with call bell/phone within reach;with bed alarm set   Nurse Communication          Time: 2202-5427 OT Time Calculation (min): 32 min  Charges: OT General Charges $OT Visit: 1 Visit OT Treatments $Self Care/Home Management : 23-37 mins  Alphia Moh OTR/L  Acute Rehab Services  [phone] office number [phone] pager number    Alphia Moh 10/16/2020, 9:01 AM

## 2020-10-16 NOTE — Progress Notes (Signed)
Physical Therapy Treatment Patient Details Name: Cynthia Cochran MRN: [id number] DOB: Jun 12, 1950 Today's Date: 10/16/2020    History of Present Illness The pt is a 70 yo female presenting 8/4 for L4-5 and L5-S1 laminectomy as well as T10-11 laminecotmy. PMH includes: prior thoracic surgery, arthritis.    PT Comments    Pt progressing with post-op mobility. She was able to demonstrate transfers and ambulation with gross min guard assist to supervision for safety and RW for support. Pt was educated on precautions, appropriate activity progression, and car transfer. Will continue to follow.      Follow Up Recommendations  Home health PT;Supervision for mobility/OOB     Equipment Recommendations  None recommended by PT (pt well equipped)    Recommendations for Other Services       Precautions / Restrictions Precautions Precautions: Back;Fall Precaution Booklet Issued: No Precaution Comments: VC's required throughout session for maintenance of back precautions during functional mobility. Required Braces or Orthoses:  (no brace needed order)    Mobility  Bed Mobility Overal bed mobility:  (presented sitting EOB)             General bed mobility comments: Verbally reviewed log roll technique.    Transfers Overall transfer level: Needs assistance Equipment used: Rolling walker (2 wheeled) Transfers: Sit to/from Stand Sit to Stand: Min guard         General transfer comment: VC's for hand placement on seated surface for safety. No assist required however close guard provided for safety.  Ambulation/Gait Ambulation/Gait assistance: Min guard Gait Distance (Feet): 200 Feet Assistive device: Rolling walker (2 wheeled) Gait Pattern/deviations: Step-to pattern;Step-through pattern;Decreased stride length;Decreased dorsiflexion - left;Trunk flexed;Narrow base of support Gait velocity: Very slow Gait velocity interpretation: <1.31 ft/sec, indicative of household  ambulator General Gait Details: Very narrow BOS and pt demonstrating tandem walking at times. Able to improve with visual cue of trying to keep each foot in 1 row of tiles when in the hallway. Noted decreased heel strike and decreased step length on the R.   Stairs             Wheelchair Mobility    Modified Rankin (Stroke Patients Only)       Balance Overall balance assessment: Needs assistance;History of Falls Sitting-balance support: No upper extremity supported;Feet supported Sitting balance-Leahy Scale: Fair     Standing balance support: Bilateral upper extremity supported;During functional activity Standing balance-Leahy Scale: Poor Standing balance comment: reliant on BUE support                            Cognition Arousal/Alertness: Awake/alert Behavior During Therapy: Impulsive Overall Cognitive Status: No family/caregiver present to determine baseline cognitive functioning Area of Impairment: Safety/judgement;Awareness;Problem solving;Attention;Memory                     Memory: Decreased recall of precautions Following Commands: Follows one step commands consistently Safety/Judgement: Decreased awareness of safety;Decreased awareness of deficits Awareness: Emergent Problem Solving: Slow processing General Comments: pt requires repeated cues on completion of activity for following precuations and easily distracted      Exercises      General Comments        Pertinent Vitals/Pain Pain Assessment: Faces Faces Pain Scale: Hurts little more Pain Location: incision Pain Descriptors / Indicators: Operative site guarding Pain Intervention(s): Limited activity within patient's tolerance;Monitored during session;Repositioned    Home Living  Prior Function            PT Goals (current goals can now be found in the care plan section) Acute Rehab PT Goals Patient Stated Goal: return home PT Goal  Formulation: With patient Time For Goal Achievement: 10/29/20 Potential to Achieve Goals: Good Progress towards PT goals: Progressing toward goals    Frequency    Min 5X/week      PT Plan Current plan remains appropriate    Co-evaluation              AM-PAC PT "6 Clicks" Mobility   Outcome Measure  Help needed turning from your back to your side while in a flat bed without using bedrails?: A Little Help needed moving from lying on your back to sitting on the side of a flat bed without using bedrails?: A Little Help needed moving to and from a bed to a chair (including a wheelchair)?: A Little Help needed standing up from a chair using your arms (e.g., wheelchair or bedside chair)?: A Little Help needed to walk in hospital room?: A Little Help needed climbing 3-5 steps with a railing? : A Little 6 Click Score: 18    End of Session Equipment Utilized During Treatment: Gait belt Activity Tolerance: Patient tolerated treatment well;Patient limited by fatigue Patient left: in chair;with call bell/phone within reach Nurse Communication: Mobility status PT Visit Diagnosis: Other abnormalities of gait and mobility (R26.89);Muscle weakness (generalized) (M62.81);Pain     Time: 8546-2703 PT Time Calculation (min) (ACUTE ONLY): 30 min  Charges:  $Gait Training: 23-37 mins                     Conni Slipper, PT, DPT Acute Rehabilitation Services Pager: [phone] Office: [phone]    Marylynn Pearson 10/16/2020, 2:13 PM

## 2020-10-16 NOTE — Discharge Instructions (Signed)

## 2020-10-18 DIAGNOSIS — M10231 Drug-induced gout, right wrist: Secondary | ICD-10-CM | POA: Diagnosis not present

## 2020-10-18 DIAGNOSIS — Z9181 History of falling: Secondary | ICD-10-CM | POA: Diagnosis not present

## 2020-10-18 DIAGNOSIS — M4804 Spinal stenosis, thoracic region: Secondary | ICD-10-CM | POA: Diagnosis not present

## 2020-10-18 DIAGNOSIS — M48061 Spinal stenosis, lumbar region without neurogenic claudication: Secondary | ICD-10-CM | POA: Diagnosis not present

## 2020-10-18 DIAGNOSIS — Z4789 Encounter for other orthopedic aftercare: Secondary | ICD-10-CM | POA: Diagnosis not present

## 2020-10-18 DIAGNOSIS — Z79891 Long term (current) use of opiate analgesic: Secondary | ICD-10-CM | POA: Diagnosis not present

## 2020-10-18 DIAGNOSIS — M5104 Intervertebral disc disorders with myelopathy, thoracic region: Secondary | ICD-10-CM | POA: Diagnosis not present

## 2020-10-18 DIAGNOSIS — M1020 Drug-induced gout, unspecified site: Secondary | ICD-10-CM | POA: Diagnosis not present

## 2020-10-18 DIAGNOSIS — M47816 Spondylosis without myelopathy or radiculopathy, lumbar region: Secondary | ICD-10-CM | POA: Diagnosis not present

## 2020-10-19 DIAGNOSIS — M47816 Spondylosis without myelopathy or radiculopathy, lumbar region: Secondary | ICD-10-CM | POA: Diagnosis not present

## 2020-10-19 DIAGNOSIS — M5104 Intervertebral disc disorders with myelopathy, thoracic region: Secondary | ICD-10-CM | POA: Diagnosis not present

## 2020-10-19 DIAGNOSIS — Z4789 Encounter for other orthopedic aftercare: Secondary | ICD-10-CM | POA: Diagnosis not present

## 2020-10-19 DIAGNOSIS — M4804 Spinal stenosis, thoracic region: Secondary | ICD-10-CM | POA: Diagnosis not present

## 2020-10-19 DIAGNOSIS — Z9181 History of falling: Secondary | ICD-10-CM | POA: Diagnosis not present

## 2020-10-19 DIAGNOSIS — M48061 Spinal stenosis, lumbar region without neurogenic claudication: Secondary | ICD-10-CM | POA: Diagnosis not present

## 2020-10-21 DIAGNOSIS — M48061 Spinal stenosis, lumbar region without neurogenic claudication: Secondary | ICD-10-CM | POA: Diagnosis not present

## 2020-10-21 DIAGNOSIS — M5104 Intervertebral disc disorders with myelopathy, thoracic region: Secondary | ICD-10-CM | POA: Diagnosis not present

## 2020-10-21 DIAGNOSIS — Z4789 Encounter for other orthopedic aftercare: Secondary | ICD-10-CM | POA: Diagnosis not present

## 2020-10-21 DIAGNOSIS — Z9181 History of falling: Secondary | ICD-10-CM | POA: Diagnosis not present

## 2020-10-21 DIAGNOSIS — M4804 Spinal stenosis, thoracic region: Secondary | ICD-10-CM | POA: Diagnosis not present

## 2020-10-21 DIAGNOSIS — M47816 Spondylosis without myelopathy or radiculopathy, lumbar region: Secondary | ICD-10-CM | POA: Diagnosis not present

## 2020-10-22 DIAGNOSIS — M47816 Spondylosis without myelopathy or radiculopathy, lumbar region: Secondary | ICD-10-CM | POA: Diagnosis not present

## 2020-10-22 DIAGNOSIS — M4804 Spinal stenosis, thoracic region: Secondary | ICD-10-CM | POA: Diagnosis not present

## 2020-10-22 DIAGNOSIS — M48061 Spinal stenosis, lumbar region without neurogenic claudication: Secondary | ICD-10-CM | POA: Diagnosis not present

## 2020-10-22 DIAGNOSIS — M5104 Intervertebral disc disorders with myelopathy, thoracic region: Secondary | ICD-10-CM | POA: Diagnosis not present

## 2020-10-22 DIAGNOSIS — Z9181 History of falling: Secondary | ICD-10-CM | POA: Diagnosis not present

## 2020-10-22 DIAGNOSIS — Z4789 Encounter for other orthopedic aftercare: Secondary | ICD-10-CM | POA: Diagnosis not present

## 2020-10-25 DIAGNOSIS — M47816 Spondylosis without myelopathy or radiculopathy, lumbar region: Secondary | ICD-10-CM | POA: Diagnosis not present

## 2020-10-25 DIAGNOSIS — M5104 Intervertebral disc disorders with myelopathy, thoracic region: Secondary | ICD-10-CM | POA: Diagnosis not present

## 2020-10-25 DIAGNOSIS — M4804 Spinal stenosis, thoracic region: Secondary | ICD-10-CM | POA: Diagnosis not present

## 2020-10-25 DIAGNOSIS — Z9181 History of falling: Secondary | ICD-10-CM | POA: Diagnosis not present

## 2020-10-25 DIAGNOSIS — Z4789 Encounter for other orthopedic aftercare: Secondary | ICD-10-CM | POA: Diagnosis not present

## 2020-10-25 DIAGNOSIS — M48061 Spinal stenosis, lumbar region without neurogenic claudication: Secondary | ICD-10-CM | POA: Diagnosis not present

## 2020-10-26 ENCOUNTER — Telehealth (INDEPENDENT_AMBULATORY_CARE_PROVIDER_SITE_OTHER): Payer: Self-pay | Admitting: *Deleted

## 2020-10-26 NOTE — Telephone Encounter (Signed)
Discussed with Dr. Karilyn Cota and he advises ER. Called pt and discussed with her to go to ED. Pt states since she called she had 2 BMs and feeling better. Thinks she has a hemmoroid. Asked for appt with dr Karilyn Cota. No available appts. Advised her to call pcp and she states she will.

## 2020-10-26 NOTE — Telephone Encounter (Signed)
Pt seen 08/12/20 for constipation. States she is the same as when seen. Prescibed golytely and linzess 145. States golytely helped clean her out some and linzess was 145. She got samples of the purple box - she didn't know dose but its . From her pcp and took for 30 days. She felt like it helped some.  states it feels like she is sitting on a tennis ball and very painful. PT told her to put on a glove and scoop it out and she has been doing that. Also today took 3 dulocax and one capful of milk of mag.   [facility] [address] [phone]

## 2020-10-27 DIAGNOSIS — M5104 Intervertebral disc disorders with myelopathy, thoracic region: Secondary | ICD-10-CM | POA: Diagnosis not present

## 2020-10-27 DIAGNOSIS — M48061 Spinal stenosis, lumbar region without neurogenic claudication: Secondary | ICD-10-CM | POA: Diagnosis not present

## 2020-10-27 DIAGNOSIS — Z4789 Encounter for other orthopedic aftercare: Secondary | ICD-10-CM | POA: Diagnosis not present

## 2020-10-27 DIAGNOSIS — M47816 Spondylosis without myelopathy or radiculopathy, lumbar region: Secondary | ICD-10-CM | POA: Diagnosis not present

## 2020-10-27 DIAGNOSIS — Z9181 History of falling: Secondary | ICD-10-CM | POA: Diagnosis not present

## 2020-10-27 DIAGNOSIS — M4804 Spinal stenosis, thoracic region: Secondary | ICD-10-CM | POA: Diagnosis not present

## 2020-10-28 DIAGNOSIS — M5104 Intervertebral disc disorders with myelopathy, thoracic region: Secondary | ICD-10-CM | POA: Diagnosis not present

## 2020-10-28 DIAGNOSIS — M47816 Spondylosis without myelopathy or radiculopathy, lumbar region: Secondary | ICD-10-CM | POA: Diagnosis not present

## 2020-10-28 DIAGNOSIS — M4804 Spinal stenosis, thoracic region: Secondary | ICD-10-CM | POA: Diagnosis not present

## 2020-10-28 DIAGNOSIS — M48061 Spinal stenosis, lumbar region without neurogenic claudication: Secondary | ICD-10-CM | POA: Diagnosis not present

## 2020-10-28 DIAGNOSIS — Z9181 History of falling: Secondary | ICD-10-CM | POA: Diagnosis not present

## 2020-10-28 DIAGNOSIS — Z4789 Encounter for other orthopedic aftercare: Secondary | ICD-10-CM | POA: Diagnosis not present

## 2020-10-29 DIAGNOSIS — M4804 Spinal stenosis, thoracic region: Secondary | ICD-10-CM | POA: Diagnosis not present

## 2020-10-29 DIAGNOSIS — Z9181 History of falling: Secondary | ICD-10-CM | POA: Diagnosis not present

## 2020-10-29 DIAGNOSIS — M48061 Spinal stenosis, lumbar region without neurogenic claudication: Secondary | ICD-10-CM | POA: Diagnosis not present

## 2020-10-29 DIAGNOSIS — M47816 Spondylosis without myelopathy or radiculopathy, lumbar region: Secondary | ICD-10-CM | POA: Diagnosis not present

## 2020-10-29 DIAGNOSIS — Z4789 Encounter for other orthopedic aftercare: Secondary | ICD-10-CM | POA: Diagnosis not present

## 2020-10-29 DIAGNOSIS — M5104 Intervertebral disc disorders with myelopathy, thoracic region: Secondary | ICD-10-CM | POA: Diagnosis not present

## 2020-11-02 DIAGNOSIS — Z4789 Encounter for other orthopedic aftercare: Secondary | ICD-10-CM | POA: Diagnosis not present

## 2020-11-02 DIAGNOSIS — Z9181 History of falling: Secondary | ICD-10-CM | POA: Diagnosis not present

## 2020-11-02 DIAGNOSIS — M5104 Intervertebral disc disorders with myelopathy, thoracic region: Secondary | ICD-10-CM | POA: Diagnosis not present

## 2020-11-02 DIAGNOSIS — M47816 Spondylosis without myelopathy or radiculopathy, lumbar region: Secondary | ICD-10-CM | POA: Diagnosis not present

## 2020-11-02 DIAGNOSIS — M48061 Spinal stenosis, lumbar region without neurogenic claudication: Secondary | ICD-10-CM | POA: Diagnosis not present

## 2020-11-02 DIAGNOSIS — M4804 Spinal stenosis, thoracic region: Secondary | ICD-10-CM | POA: Diagnosis not present

## 2020-11-03 DIAGNOSIS — Z4789 Encounter for other orthopedic aftercare: Secondary | ICD-10-CM | POA: Diagnosis not present

## 2020-11-03 DIAGNOSIS — M5104 Intervertebral disc disorders with myelopathy, thoracic region: Secondary | ICD-10-CM | POA: Diagnosis not present

## 2020-11-03 DIAGNOSIS — Z9181 History of falling: Secondary | ICD-10-CM | POA: Diagnosis not present

## 2020-11-03 DIAGNOSIS — M48061 Spinal stenosis, lumbar region without neurogenic claudication: Secondary | ICD-10-CM | POA: Diagnosis not present

## 2020-11-03 DIAGNOSIS — M47816 Spondylosis without myelopathy or radiculopathy, lumbar region: Secondary | ICD-10-CM | POA: Diagnosis not present

## 2020-11-03 DIAGNOSIS — M4804 Spinal stenosis, thoracic region: Secondary | ICD-10-CM | POA: Diagnosis not present

## 2020-11-04 DIAGNOSIS — M5104 Intervertebral disc disorders with myelopathy, thoracic region: Secondary | ICD-10-CM | POA: Diagnosis not present

## 2020-11-04 DIAGNOSIS — Z9181 History of falling: Secondary | ICD-10-CM | POA: Diagnosis not present

## 2020-11-04 DIAGNOSIS — M48061 Spinal stenosis, lumbar region without neurogenic claudication: Secondary | ICD-10-CM | POA: Diagnosis not present

## 2020-11-04 DIAGNOSIS — M4804 Spinal stenosis, thoracic region: Secondary | ICD-10-CM | POA: Diagnosis not present

## 2020-11-04 DIAGNOSIS — M47816 Spondylosis without myelopathy or radiculopathy, lumbar region: Secondary | ICD-10-CM | POA: Diagnosis not present

## 2020-11-04 DIAGNOSIS — Z4789 Encounter for other orthopedic aftercare: Secondary | ICD-10-CM | POA: Diagnosis not present

## 2020-11-08 ENCOUNTER — Other Ambulatory Visit: Payer: Self-pay

## 2020-11-08 ENCOUNTER — Ambulatory Visit (INDEPENDENT_AMBULATORY_CARE_PROVIDER_SITE_OTHER): Payer: Medicare Other | Admitting: Gastroenterology

## 2020-11-08 ENCOUNTER — Encounter (INDEPENDENT_AMBULATORY_CARE_PROVIDER_SITE_OTHER): Payer: Self-pay | Admitting: Gastroenterology

## 2020-11-08 VITALS — BP 145/82 | HR 70 | Temp 98.5°F | Ht 62.0 in | Wt 151.0 lb

## 2020-11-08 DIAGNOSIS — M48061 Spinal stenosis, lumbar region without neurogenic claudication: Secondary | ICD-10-CM | POA: Diagnosis not present

## 2020-11-08 DIAGNOSIS — Z4789 Encounter for other orthopedic aftercare: Secondary | ICD-10-CM | POA: Diagnosis not present

## 2020-11-08 DIAGNOSIS — M4804 Spinal stenosis, thoracic region: Secondary | ICD-10-CM | POA: Diagnosis not present

## 2020-11-08 DIAGNOSIS — K5909 Other constipation: Secondary | ICD-10-CM

## 2020-11-08 DIAGNOSIS — M5104 Intervertebral disc disorders with myelopathy, thoracic region: Secondary | ICD-10-CM | POA: Diagnosis not present

## 2020-11-08 DIAGNOSIS — M47816 Spondylosis without myelopathy or radiculopathy, lumbar region: Secondary | ICD-10-CM | POA: Diagnosis not present

## 2020-11-08 DIAGNOSIS — Z9181 History of falling: Secondary | ICD-10-CM | POA: Diagnosis not present

## 2020-11-08 NOTE — Patient Instructions (Addendum)
Please increase your water intake, atleast 64 oz of water per day Increase fruits/veggies, especially prunes, kiwi, beans as these have more fiber Start trulance samples, once daily, if this works let us know and we can send prescription, if it does not work we can try something else. You should stop miralax when using trulance  Follow up in 3 months

## 2020-11-08 NOTE — Progress Notes (Signed)
Referring Provider: Benita StabileHall, John Z, MD Primary Care Physician:  Benita StabileHall, John Z, MD Primary GI Physician: Rehman  Chief Complaint  Patient presents with   Constipation    Follow up on constipation, hemorroids, has seen a little blood when wiping, feels like she is sitting on a tennis ball.    HPI:   Cynthia Cochran is a 70 y.o. female with past medical history of anxiety, dysphagia, gastric ulcer, GERD, high cholesterol, HLD.   Patient presenting today for constipation. taking 1 capful of miralax in the mornings, up to 8 dulcolax/day as well as milk of magnesia sometimes. More constipation since back surgery x3 since may. She states she feels like she is sitting on a ball. She reports some intermittent diarrhea when she takes stool softeners. Had Rx for for linzess but it was $800, PCP gave her 1 month supply of both higher strengths in samples but states she couldn't really tell a difference.   States she feels like she is sitting on a tennis ball, reports symptoms began in may. Denies any pain, itching or discomfort. She does a hemorrhoid suppository with preparation H every night with some relief. Denies melena, reports some bright red blood on paper when she wipes. She denies straining to have BM due to watery BMs from stool softeners. 1-2 BMs per week only with intervention. She reports she has been very sedentary and not eating well since her back surgery. She does feel like BMs are sufficient. Occasional fecal urgency after taking medications to help her go. Denies any abdominal pain, cramping, nausea or early satiety. She has not taken oxycodone in over 1 week, is taking tylenol but has not seen any improvement in her BMs since then.   Last Colonoscopy:10/04/18- Diverticulosis in the sigmoid colon and at the hepatic flexure. - External hemorrhoids. - Anal papilla(e) were hypertrophied. - No specimens collected. Last Endoscopy:10/04/18- Normal esophagus. - Z-line irregular, 33 cm from the  incisors. - 2 cm hiatal hernia. - Normal cardia, gastric fundus, gastric body and pylorus. - Scar in the gastric antrum (posterior wall). Healed ulcer. - Gastritis. Biopsied. - Normal duodenal bulb. - Post-bulbar erythema.. - Normal second portion of the duodenum.  Brother and father both had CRC, at ages 5965 and 3372 respectively   Recommendations:  Repeat surveillance colonoscopy in 2025  Past Medical History:  Diagnosis Date   Anxiety    Arthritis    Blood transfusion without reported diagnosis 1982   after c section   Dysphagia 04/25/2016   Gastric ulcer    GERD (gastroesophageal reflux disease)    Heart murmur    High cholesterol 04/25/2016   Hyperlipidemia    Mixed stress and urge urinary incontinence 09/09/2015   Sciatic nerve pain    Spinal stenosis     Past Surgical History:  Procedure Laterality Date   BIOPSY  05/12/2016   Procedure: BIOPSY;  Surgeon: Malissa HippoNajeeb U Rehman, MD;  [facility];  Service: Endoscopy;;  gastric   BIOPSY  10/04/2018   Procedure: BIOPSY;  Surgeon: Malissa Hippoehman, Najeeb U, MD;  [facility]: [facility];  Service: Endoscopy;;  gastric    CARPAL TUNNEL RELEASE     right; 1998, left 1998   CESAREAN SECTION  1982   COLONOSCOPY WITH PROPOFOL N/A 10/04/2018   Procedure: COLONOSCOPY WITH PROPOFOL;  Surgeon: Malissa Hippoehman, Najeeb U, MD;  [facility]: [facility];  Service: Endoscopy;  Laterality: N/A;  7:30   Colonscopy     ESOPHAGEAL DILATION N/A  05/12/2016   Procedure: ESOPHAGEAL DILATION;  Surgeon: Malissa Hippo, MD;  [facility];  Service: Endoscopy;  Laterality: N/A;   ESOPHAGOGASTRODUODENOSCOPY N/A 05/12/2016   Procedure: ESOPHAGOGASTRODUODENOSCOPY (EGD);  Surgeon: Malissa Hippo, MD;  [facility];  Service: Endoscopy;  Laterality: N/A;  9:30   ESOPHAGOGASTRODUODENOSCOPY (EGD) WITH PROPOFOL N/A 10/04/2018   Procedure: ESOPHAGOGASTRODUODENOSCOPY (EGD) WITH PROPOFOL;  Surgeon: Malissa Hippo, MD;  [facility]: [facility];  Service:  Endoscopy;  Laterality: N/A;   EYE SURGERY     lasix   JOINT REPLACEMENT     both hips   LUMBAR LAMINECTOMY/DECOMPRESSION MICRODISCECTOMY N/A 08/05/2020   Procedure: LAMINECTOMY THORACIC TEN-ELEVEN;  Surgeon: Coletta Memos, MD;  Location: [facility];  Service: Neurosurgery;  Laterality: N/A;   LUMBAR LAMINECTOMY/DECOMPRESSION MICRODISCECTOMY N/A 10/14/2020   Procedure: Right Lumbar 4-5, Lumbar 5 Sacral 1 Lumbar and thoracic laminectomy;  Surgeon: Coletta Memos, MD;  Location: [facility];  Service: Neurosurgery;  Laterality: N/A;  Right Lumbar 4-5, Lumbar 5 Sacral 1 Lumbar and thoracic laminectomy   TOTAL HIP ARTHROPLASTY     right; March 2013; left March 2011    Current Outpatient Medications  Medication Sig Dispense Refill   atorvastatin (LIPITOR) 80 MG tablet Take 80 mg by mouth at bedtime.      cyclobenzaprine (FLEXERIL) 5 MG tablet Take 1 tablet (5 mg total) by mouth at bedtime. 12 tablet 0   diclofenac Sodium (VOLTAREN) 1 % GEL Apply 2 g topically daily as needed (pain).     docusate sodium (COLACE) 50 MG capsule Take 50 mg by mouth daily.     DULoxetine (CYMBALTA) 30 MG capsule Take 30 mg by mouth daily.     escitalopram (LEXAPRO) 10 MG tablet TAKE 1 TABLET BY MOUTH DAILY. INCREASE FROM 5 MG 90 tablet 0   ezetimibe (ZETIA) 10 MG tablet Take 10 mg by mouth at bedtime.      LORazepam (ATIVAN) 2 MG tablet Take 2 mg by mouth at bedtime.  2   losartan (COZAAR) 25 MG tablet Take 25 mg by mouth daily.     oxybutynin (DITROPAN-XL) 5 MG 24 hr tablet Take 5 mg by mouth daily.     oxyCODONE (OXY IR/ROXICODONE) 5 MG immediate release tablet Take 1 tablet (5 mg total) by mouth every 3 (three) hours as needed for moderate pain ((score 4 to 6)). 30 tablet 0   HYDROcodone-acetaminophen (NORCO/VICODIN) 5-325 MG tablet Take 1 tablet by mouth 3 (three) times daily as needed for pain. (Patient not taking: Reported on 11/08/2020)     tiZANidine (ZANAFLEX) 4 MG tablet Take 1 tablet (4 mg total) by mouth every 6 (six)  hours as needed for muscle spasms. (Patient not taking: Reported on 11/08/2020) 60 tablet 0   No current facility-administered medications for this visit.    Allergies as of 11/08/2020 - Review Complete 11/08/2020  Allergen Reaction Noted   Gabapentin Other (See Comments) 09/25/2018   Lyrica [pregabalin] Other (See Comments) 11/04/2018   Mobic [meloxicam] Other (See Comments) 09/25/2018   Nsaids Other (See Comments) 05/24/2018    Family History  Problem Relation Age of Onset   Colon cancer Father 49   Colon cancer Brother 18   Other Mother        heavy smoker; died from arterial sclerosis   Hypertension Sister    Stomach cancer Neg Hx     Social History   Socioeconomic History   Marital status: Widowed    Spouse name: Not on  file   Number of children: 0   Years of education: some college   Highest education level: Not on file  Occupational History   Occupation: Retired  Tobacco Use   Smoking status: Never   Smokeless tobacco: Never  Vaping Use   Vaping Use: Never used  Substance and Sexual Activity   Alcohol use: Not Currently   Drug use: No   Sexual activity: Not Currently    Birth control/protection: Post-menopausal  Other Topics Concern   Not on file  Social History Narrative   Lives alone   Caffeine use: no more than 2 cups per day    16 oz soda per day   Right handed    Social Determinants of Health   Financial Resource Strain: Not on file  Food Insecurity: Not on file  Transportation Needs: Not on file  Physical Activity: Not on file  Stress: Not on file  Social Connections: Not on file   Review of Systems: Gen: Denies fever, chills, anorexia. Denies fatigue, weakness, weight loss.  CV: Denies chest pain, palpitations, syncope, peripheral edema, and claudication. Resp: Denies dyspnea at rest, cough, wheezing, coughing up blood, and pleurisy. GI: Denies vomiting blood, jaundice, and fecal incontinence. Denies dysphagia or odynophagia.  +constipation Derm: Denies rash, itching, dry skin Psych: Denies depression, anxiety, memory loss, confusion. No homicidal or suicidal ideation.  Heme: Denies bruising, bleeding, and enlarged lymph nodes.  Physical Exam: BP (!) 145/82 (BP Location: Right Arm, Patient Position: Sitting, Cuff Size: Large)   Pulse 70   Temp 98.5 F (36.9 C) (Oral)   Ht 5\' 2"  (1.575 m)   Wt 151 lb (68.5 kg)   BMI 27.62 kg/m  General:   Alert and oriented. No distress noted. Pleasant and cooperative.  Head:  Normocephalic and atraumatic. Eyes:  Conjuctiva clear without scleral icterus. Mouth:  Oral mucosa pink and moist. Good dentition. No lesions. Heart: Normal rate and rhythm, s1 and s2 heart sounds present.  Lungs: Clear lung sounds in all lobes. Respirations equal and unlabored. Abdomen:  +BS, soft, non-tender and non-distended. No rebound or guarding. No HSM or masses noted. Derm: No palmar erythema or jaundice Msk:  Symmetrical without gross deformities. Normal posture. Extremities:  Without edema. Neurologic:  Alert and  oriented x4 Psych:  Alert and cooperative. Normal mood and affect.  Invalid input(s): 6 MONTHS   ASSESSMENT: Cynthia Cochran is a 70 y.o. female presenting today for ongoing constipation, worse since multiple back surgeries over the past few months. Taking 1 capful miralax, up to 8 dulcolax and sometimes milk of magnesia with only 1-2 BMs per week. Having some watery BMs and fecal urgency when taking multiple stool softeners. She denies weight loss, early satiety, nausea or vomiting. She has noted some blood on paper a few times when wiping and endorses feeling like she is "sitting on a ball." She denies straining to have a BM and feels when she is able to go, she empties her bowels sufficiently. Endorses poor diet and decrease water intake as well as being very sedentary over the past few months. No melena or hematochezia. I suspect she is very constipated in part due to her surgery  and poor diet. She denies any recent oxycodone intake.    Patient is concerned for tumor due to feeling like she is sitting on a ball, rectal exam was unremarkable other than very small internal and external hemorrhoids. Last colonoscopy in 2020 was normal. Patient reassured that due to recent colonoscopy and  clinical presentation, very low concern for tumor/malignancy as cause of her symptoms, however, We will trial samples of trulance for the next couple of weeks, if no improvement in BMs, we will consider imagining to rule out any further causes of constipation as she has also tried linzess in the past without much result.   **Dr. Levon Hedger present as witness for performed rectal exam.   PLAN:  Trial samples of trulance, can Rx if good results from this 2. Increase water intake 3. Increase fruits/veggies esp kiwi, prunes 4. Consider diagnostic imagining if constipation fails to improve.   Follow Up: 3 months  Case discussed with Dr. Levon Hedger who is in agreement with plan of care, as outlined above.   Archie Shea L. Jeanmarie Hubert, MSN, APRN, AGNP-C Adult-Gerontology Nurse Practitioner [facility]

## 2020-11-09 ENCOUNTER — Ambulatory Visit: Payer: Self-pay | Admitting: *Deleted

## 2020-11-09 NOTE — Telephone Encounter (Signed)
I did not need to speak with pt.   She called in on the community line needing a referral to a psychiatrist or psychologist.   She is going to contact her PCP for this referral.  I clarified for the agent that the referral would need to come through her PCP.

## 2020-11-16 ENCOUNTER — Telehealth (INDEPENDENT_AMBULATORY_CARE_PROVIDER_SITE_OTHER): Payer: Self-pay

## 2020-11-16 DIAGNOSIS — M5104 Intervertebral disc disorders with myelopathy, thoracic region: Secondary | ICD-10-CM | POA: Diagnosis not present

## 2020-11-16 DIAGNOSIS — Z4789 Encounter for other orthopedic aftercare: Secondary | ICD-10-CM | POA: Diagnosis not present

## 2020-11-16 DIAGNOSIS — M47816 Spondylosis without myelopathy or radiculopathy, lumbar region: Secondary | ICD-10-CM | POA: Diagnosis not present

## 2020-11-16 DIAGNOSIS — M4804 Spinal stenosis, thoracic region: Secondary | ICD-10-CM | POA: Diagnosis not present

## 2020-11-16 DIAGNOSIS — Z9181 History of falling: Secondary | ICD-10-CM | POA: Diagnosis not present

## 2020-11-16 DIAGNOSIS — M48061 Spinal stenosis, lumbar region without neurogenic claudication: Secondary | ICD-10-CM | POA: Diagnosis not present

## 2020-11-16 NOTE — Telephone Encounter (Signed)
Patient called today stating she was given samples of Trulance at her office visit on 11/08/2020, was told to inform the office if it was helping. Patient states this has not helped and it is not covered by her health insurance she would like to know if there is something else we  have samples of that she could try. Please advise.

## 2020-11-17 DIAGNOSIS — M5104 Intervertebral disc disorders with myelopathy, thoracic region: Secondary | ICD-10-CM | POA: Diagnosis not present

## 2020-11-17 DIAGNOSIS — M4804 Spinal stenosis, thoracic region: Secondary | ICD-10-CM | POA: Diagnosis not present

## 2020-11-17 DIAGNOSIS — M48061 Spinal stenosis, lumbar region without neurogenic claudication: Secondary | ICD-10-CM | POA: Diagnosis not present

## 2020-11-17 DIAGNOSIS — Z9181 History of falling: Secondary | ICD-10-CM | POA: Diagnosis not present

## 2020-11-17 DIAGNOSIS — M47816 Spondylosis without myelopathy or radiculopathy, lumbar region: Secondary | ICD-10-CM | POA: Diagnosis not present

## 2020-11-17 DIAGNOSIS — Z4789 Encounter for other orthopedic aftercare: Secondary | ICD-10-CM | POA: Diagnosis not present

## 2020-11-17 DIAGNOSIS — Z79891 Long term (current) use of opiate analgesic: Secondary | ICD-10-CM | POA: Diagnosis not present

## 2020-11-18 NOTE — Telephone Encounter (Signed)
Per patient she can get the generic Linzess for 3 dollars per her insurance. Please send in to [facility] Mail order. We do have samples of Linzess which mg do you want to give her? Please advise.

## 2020-11-18 NOTE — Telephone Encounter (Signed)
Patient aware we have # 32 tablets for her. She will pick up from the front desk this week. She will let us know how she likes it and will let us know to send in the generic of Linzess 145 at that time.

## 2020-11-23 DIAGNOSIS — M48061 Spinal stenosis, lumbar region without neurogenic claudication: Secondary | ICD-10-CM | POA: Diagnosis not present

## 2020-11-23 DIAGNOSIS — M5104 Intervertebral disc disorders with myelopathy, thoracic region: Secondary | ICD-10-CM | POA: Diagnosis not present

## 2020-11-23 DIAGNOSIS — Z4789 Encounter for other orthopedic aftercare: Secondary | ICD-10-CM | POA: Diagnosis not present

## 2020-11-23 DIAGNOSIS — M4804 Spinal stenosis, thoracic region: Secondary | ICD-10-CM | POA: Diagnosis not present

## 2020-11-23 DIAGNOSIS — M47816 Spondylosis without myelopathy or radiculopathy, lumbar region: Secondary | ICD-10-CM | POA: Diagnosis not present

## 2020-11-23 DIAGNOSIS — Z9181 History of falling: Secondary | ICD-10-CM | POA: Diagnosis not present

## 2020-11-27 DIAGNOSIS — F411 Generalized anxiety disorder: Secondary | ICD-10-CM | POA: Diagnosis not present

## 2020-11-27 DIAGNOSIS — K644 Residual hemorrhoidal skin tags: Secondary | ICD-10-CM | POA: Diagnosis not present

## 2020-11-27 DIAGNOSIS — Z23 Encounter for immunization: Secondary | ICD-10-CM | POA: Diagnosis not present

## 2020-11-27 DIAGNOSIS — Z0001 Encounter for general adult medical examination with abnormal findings: Secondary | ICD-10-CM | POA: Diagnosis not present

## 2020-11-27 DIAGNOSIS — M25552 Pain in left hip: Secondary | ICD-10-CM | POA: Diagnosis not present

## 2020-11-27 DIAGNOSIS — I10 Essential (primary) hypertension: Secondary | ICD-10-CM | POA: Diagnosis not present

## 2020-11-29 DIAGNOSIS — M48061 Spinal stenosis, lumbar region without neurogenic claudication: Secondary | ICD-10-CM | POA: Diagnosis not present

## 2020-11-29 DIAGNOSIS — M4804 Spinal stenosis, thoracic region: Secondary | ICD-10-CM | POA: Diagnosis not present

## 2020-11-29 DIAGNOSIS — M47816 Spondylosis without myelopathy or radiculopathy, lumbar region: Secondary | ICD-10-CM | POA: Diagnosis not present

## 2020-11-29 DIAGNOSIS — Z4789 Encounter for other orthopedic aftercare: Secondary | ICD-10-CM | POA: Diagnosis not present

## 2020-11-29 DIAGNOSIS — M5104 Intervertebral disc disorders with myelopathy, thoracic region: Secondary | ICD-10-CM | POA: Diagnosis not present

## 2020-11-29 DIAGNOSIS — Z9181 History of falling: Secondary | ICD-10-CM | POA: Diagnosis not present

## 2020-12-07 DIAGNOSIS — M19021 Primary osteoarthritis, right elbow: Secondary | ICD-10-CM | POA: Diagnosis not present

## 2020-12-07 DIAGNOSIS — M7552 Bursitis of left shoulder: Secondary | ICD-10-CM | POA: Diagnosis not present

## 2020-12-07 DIAGNOSIS — M542 Cervicalgia: Secondary | ICD-10-CM | POA: Diagnosis not present

## 2020-12-09 ENCOUNTER — Ambulatory Visit (INDEPENDENT_AMBULATORY_CARE_PROVIDER_SITE_OTHER): Payer: Medicare Other | Admitting: Gastroenterology

## 2020-12-14 ENCOUNTER — Telehealth (INDEPENDENT_AMBULATORY_CARE_PROVIDER_SITE_OTHER): Payer: Self-pay

## 2020-12-14 NOTE — Telephone Encounter (Signed)
Mitzie please place this patient on your cancellation list for Dr. Karilyn Cota please.  Patient called today stating she was recently seen here in the office by Np, but she is wanting to see Dr.Rehman only. She asked that we place her on a cancellation list with him.   She states she needs some relief from the way her bowels have been moving. She states she has two bm per week usually. She states stools alternate between constipation and producing very samll balls of stool to Diarrhea that last all day usually one day per week.   She states she has a sensation of sitting on a tennis ball.She was told she has hemorrhoids, but that they were very small and no need to be referred to surgeon for removal, but states sitting is very uncomfortable.  Patient states she has been doing every thing that the other providers have recommended and still feels these treatments are insufficient. (Has been using Linzess,taking colace, eating fruit taking fiber using suppositories,etc...) Wants to be referred to Dr.Jenkins office for consultation.

## 2020-12-15 NOTE — Telephone Encounter (Signed)
Patient aware of all and aware to be at the clinic on 12/23/2020 at 3:30 pm to see Dr.Rehman.  Per Dr. Karilyn Cota continue to use the suppositories Q day or Q other day. Work patient in on 12/23/2020. Will discuss the need for surgical intervention at visit.

## 2020-12-21 ENCOUNTER — Encounter (INDEPENDENT_AMBULATORY_CARE_PROVIDER_SITE_OTHER): Payer: Self-pay | Admitting: Internal Medicine

## 2020-12-21 ENCOUNTER — Ambulatory Visit (INDEPENDENT_AMBULATORY_CARE_PROVIDER_SITE_OTHER): Payer: Medicare Other | Admitting: Internal Medicine

## 2020-12-21 ENCOUNTER — Other Ambulatory Visit: Payer: Self-pay

## 2020-12-21 VITALS — BP 137/74 | HR 84 | Temp 98.5°F | Ht 62.0 in | Wt 151.0 lb

## 2020-12-21 DIAGNOSIS — K602 Anal fissure, unspecified: Secondary | ICD-10-CM | POA: Insufficient documentation

## 2020-12-21 DIAGNOSIS — K5900 Constipation, unspecified: Secondary | ICD-10-CM | POA: Diagnosis not present

## 2020-12-21 MED ORDER — METAMUCIL SMOOTH TEXTURE 58.6 % PO POWD: 1.0000 | Freq: Every day | ORAL | 12 refills | Status: DC

## 2020-12-21 MED ORDER — DILTIAZEM GEL 2 %: 1.0000 "application " | Freq: Two times a day (BID) | CUTANEOUS | 1 refills | Status: DC

## 2020-12-21 NOTE — Progress Notes (Signed)
Presenting complaint;  Irregular bowel movements. Anorectal discomfort.  Database and subjective:  Patient is 70 year old Caucasian female who is here for scheduled visit.  She was last seen by Dr. Jenetta Downer on 08/12/2020 for constipation and he felt she had IBS-C.  He recommended GoLytely prep and then Linzess/linaclotide 145 mcg daily. Patient called back to let us know that her co-pay was very high for Linzess as well as Amitiza and Trulance.  She was therefore advised to take MiraLAX along with stool softener every day. Patient states she has been through a lot since her last visit. Prior to her last visit she had surgery for kyphoscoliosis on Aug 05, 2020.  She had another surgery on 10/14/2020.  She says she is numb in both feet.  She was receiving physical therapy which she has completed.  She does not take any narcotics.  She is on Tylenol which she uses on as-needed basis.  She remains with irregular bowel movements.  She can go as as long as 1 week without a bowel movement and then she has explosive diarrhea.  Her bowels do usually move every other day but is not a good evacuation.  She eats oatmeal at breakfast every day.  Lunch is usually small.  She tries to eat some greens fruits at supper.  She also has lower abdominal pain which is mild and relieved with defecation.  She denies nausea or vomiting.  For the last few days she has noted a lump in her rectum and she at times feels she is sitting on a tennis ball.  Her appetite is normal and she has not lost any weight.  She is also taking fiber Gummies every day. Last colonoscopy was in July 2020 revealing diverticuli at hepatic flexure and sigmoid colon and external hemorrhoids.   Current Medications: Outpatient Encounter Medications as of 12/21/2020  Medication Sig   acetaminophen (TYLENOL) 650 MG CR tablet Take 650 mg by mouth. Takes 2 per day as needed for pain   atorvastatin (LIPITOR) 80 MG tablet Take 80 mg by mouth at bedtime.     Calcium Carbonate (CALCIUM 600 PO) Take by mouth. One daily   cyclobenzaprine (FLEXERIL) 5 MG tablet Take 1 tablet (5 mg total) by mouth at bedtime.   diclofenac Sodium (VOLTAREN) 1 % GEL Apply 2 g topically daily as needed (pain).   DULoxetine (CYMBALTA) 30 MG capsule Take 30 mg by mouth daily.   escitalopram (LEXAPRO) 10 MG tablet TAKE 1 TABLET BY MOUTH DAILY. INCREASE FROM 5 MG   ezetimibe (ZETIA) 10 MG tablet Take 10 mg by mouth at bedtime.    LORazepam (ATIVAN) 2 MG tablet Take 2 mg by mouth at bedtime.   losartan (COZAAR) 25 MG tablet Take 25 mg by mouth daily.   Magnesium Hydroxide (DULCOLAX PO) Take by mouth. Chewable one daily   OVER THE COUNTER MEDICATION Fiber gummies 3 a day   OVER THE COUNTER MEDICATION Prep H suppositories 3 -4 times daily as needed   OVER THE COUNTER MEDICATION Vitamin D 5,000 IU daily   oxybutynin (DITROPAN-XL) 5 MG 24 hr tablet Take 5 mg by mouth daily.   polyethylene glycol (MIRALAX / GLYCOLAX) 17 g packet Take 17 g by mouth daily. One capful daily in coffee   [DISCONTINUED] docusate sodium (COLACE) 50 MG capsule Take 50 mg by mouth daily.   [DISCONTINUED] HYDROcodone-acetaminophen (NORCO/VICODIN) 5-325 MG tablet Take 1 tablet by mouth 3 (three) times daily as needed for pain. (Patient not taking: Reported on  11/08/2020)   [DISCONTINUED] oxyCODONE (OXY IR/ROXICODONE) 5 MG immediate release tablet Take 1 tablet (5 mg total) by mouth every 3 (three) hours as needed for moderate pain ((score 4 to 6)).   [DISCONTINUED] tiZANidine (ZANAFLEX) 4 MG tablet Take 1 tablet (4 mg total) by mouth every 6 (six) hours as needed for muscle spasms. (Patient not taking: Reported on 11/08/2020)   No facility-administered encounter medications on file as of 12/21/2020.     Objective: Blood pressure 137/74, pulse 84, temperature 98.5 F (36.9 C), temperature source Oral, height _0  (1.575 m), weight 151 lb (68.5 kg). Patient is alert and in no acute distress. Conjunctiva is  pink. Sclera is nonicteric Oropharyngeal mucosa is normal. No neck masses or thyromegaly noted. Cardiac exam with regular rhythm normal S1 and S2. No murmur or gallop noted. Lungs are clear to auscultation. Abdomen is symmetrical.  Bowel sounds are normal.  On palpation abdomen is soft.  She has mild hypogastric tenderness without guarding.  No organomegaly or masses. Rectal examination reveals no external abnormality.  Digital exam reveals increased sphincter tone and some discomfort.  She had soft stool in the vault.  Stool was guaiac negative.  Stool is not hard to suggest fecal impaction. No LE edema or clubbing noted.  Labs/studies Results:   CBC Latest Ref Rng & Units 10/14/2020 08/04/2020 05/10/2018  WBC 4.0 - 10.5 K/uL 4.8 5.4 4.4  Hemoglobin 12.0 - 15.0 g/dL 13.2 14.0 12.6  Hematocrit 36.0 - 46.0 % 40.3 42.1 38.4  Platelets 150 - 400 K/uL 167 236 211    CMP Latest Ref Rng & Units 10/14/2020 08/04/2020 07/22/2019  Glucose 70 - 99 mg/dL 100(H) 98 -  BUN 8 - 23 mg/dL 15 9 -  Creatinine 0.44 - 1.00 mg/dL 0.61 0.65 0.80  Sodium 135 - 145 mmol/L 138 136 -  Potassium 3.5 - 5.1 mmol/L 3.6 3.7 -  Chloride 98 - 111 mmol/L 106 106 -  CO2 22 - 32 mmol/L 22 25 -  Calcium 8.9 - 10.3 mg/dL 8.8(L) 8.7(L) -  Total Protein 6.5 - 8.1 g/dL - - -  Total Bilirubin 0.3 - 1.2 mg/dL - - -  Alkaline Phos 38 - 126 U/L - - -  AST 15 - 41 U/L - - -  ALT 0 - 44 U/L - - -    Hepatic Function Latest Ref Rng & Units 04/08/2018 04/07/2018  Total Protein 6.5 - 8.1 g/dL 6.5 7.7  Albumin 3.5 - 5.0 g/dL 3.5 4.3  AST 15 - 41 U/L 17 20  ALT 0 - 44 U/L 14 15  Alk Phosphatase 38 - 126 U/L 27(L) 31(L)  Total Bilirubin 0.3 - 1.2 mg/dL 1.0 1.2    Above lab data reviewed.  Assessment:  #1.  Chronic constipation.  She has not responded to fiber supplement polyethylene glycol.  Recent back surgery I am sure has contributed to her bowel issues.  She needs to be on MiraLAX dose as other medications are not covered her  co-pay is too high.  #2.  High sphincter tone is suggestive of anal fissure which also would explain her symptom of sitting on a tennis ball.  Plan:  Discontinue gummy fiber. Metamucil 4 g by mouth daily at bedtime. Sitz bath's twice daily for 2 weeks. Diltiazem gel to anal canal twice daily as directed Patient advised to take polyethylene glycol every day and she can titrate dose up or down. Patient will call with progress report in 2 weeks. Follow-up in  2 months.

## 2020-12-21 NOTE — Patient Instructions (Signed)
Apply diltiazem gel to anal canal twice daily as directed. Metamucil 1 packet or 4 g by mouth daily at bedtime. Take polyethylene glycol daily can titrate dose.  Start with 8.5 g daily and adjust dose up or down as needed. Sitz bath's once or twice daily for 2 weeks Please call office with progress report in 2 weeks.

## 2020-12-23 ENCOUNTER — Ambulatory Visit (INDEPENDENT_AMBULATORY_CARE_PROVIDER_SITE_OTHER): Payer: Medicare Other | Admitting: Internal Medicine

## 2020-12-24 ENCOUNTER — Telehealth (INDEPENDENT_AMBULATORY_CARE_PROVIDER_SITE_OTHER): Payer: Self-pay | Admitting: Internal Medicine

## 2020-12-24 NOTE — Telephone Encounter (Signed)
Patient left voice mail message stating it may take up to two weeks for her to get the medication - she wants to know if there is anything else she can take in the mean time. - please advise - [phone]

## 2020-12-24 NOTE — Telephone Encounter (Signed)
Called pt and she states she left this message yesterday before she went and pick up med. So she did get the medication she needed and has started it. Does not need anything at this time.

## 2021-01-01 DIAGNOSIS — M5442 Lumbago with sciatica, left side: Secondary | ICD-10-CM | POA: Diagnosis not present

## 2021-01-04 ENCOUNTER — Telehealth (INDEPENDENT_AMBULATORY_CARE_PROVIDER_SITE_OTHER): Payer: Self-pay | Admitting: *Deleted

## 2021-01-04 NOTE — Telephone Encounter (Signed)
Pt called to give 2 week update. States her BM's are much improved but still feels like she is sitting on a tennis ball. Not worse but not any better. Using diltiazem gel bid and doing sitz baths.  Per dr Karilyn Cota can use gel for another 2 weeks or can go ahead and schedule signmoidoscopy. Can use med up to 4 weeks. Call and ask pt what she would like to do.   Pt states she will try med for another week and call back with update.

## 2021-01-05 NOTE — Telephone Encounter (Signed)
error 

## 2021-01-06 ENCOUNTER — Other Ambulatory Visit: Payer: Self-pay | Admitting: Internal Medicine

## 2021-01-06 DIAGNOSIS — Z1231 Encounter for screening mammogram for malignant neoplasm of breast: Secondary | ICD-10-CM

## 2021-01-12 ENCOUNTER — Telehealth (INDEPENDENT_AMBULATORY_CARE_PROVIDER_SITE_OTHER): Payer: Self-pay | Admitting: *Deleted

## 2021-01-12 NOTE — Telephone Encounter (Signed)
Pt called and states she would like to go ahead with sigmoidoscopy. States she has used all of the diltiazem gel and still feels like she is sitting on a tennis ball. Would like to get this scheduled in November.   [phone]

## 2021-01-14 NOTE — Telephone Encounter (Signed)
Per dr Karilyn Cota schedule flexible signmoidoscopy with conscious sedation. Pt would like this done in November if possible.

## 2021-01-19 ENCOUNTER — Encounter (INDEPENDENT_AMBULATORY_CARE_PROVIDER_SITE_OTHER): Payer: Self-pay

## 2021-01-19 ENCOUNTER — Other Ambulatory Visit (INDEPENDENT_AMBULATORY_CARE_PROVIDER_SITE_OTHER): Payer: Self-pay

## 2021-01-19 DIAGNOSIS — K5900 Constipation, unspecified: Secondary | ICD-10-CM

## 2021-01-19 DIAGNOSIS — K602 Anal fissure, unspecified: Secondary | ICD-10-CM

## 2021-01-20 ENCOUNTER — Telehealth (INDEPENDENT_AMBULATORY_CARE_PROVIDER_SITE_OTHER): Payer: Self-pay | Admitting: *Deleted

## 2021-01-20 NOTE — Telephone Encounter (Signed)
Pt left voicemail that she is having a flexible sigmoidoscopy on 12/1 and has travel plans on 12/3 and asking if she will be ok to travel on that date.   Left message to return call

## 2021-01-21 NOTE — Telephone Encounter (Signed)
Per dr Karilyn Cota - change procedure to Friday the 18th when he is on call. I called and discussed with pt. She states that day will work for her.

## 2021-01-24 DIAGNOSIS — M5416 Radiculopathy, lumbar region: Secondary | ICD-10-CM | POA: Diagnosis not present

## 2021-01-24 DIAGNOSIS — Z6828 Body mass index (BMI) 28.0-28.9, adult: Secondary | ICD-10-CM | POA: Diagnosis not present

## 2021-01-24 DIAGNOSIS — I10 Essential (primary) hypertension: Secondary | ICD-10-CM | POA: Diagnosis not present

## 2021-01-24 DIAGNOSIS — M4726 Other spondylosis with radiculopathy, lumbar region: Secondary | ICD-10-CM | POA: Diagnosis not present

## 2021-01-26 ENCOUNTER — Encounter (HOSPITAL_COMMUNITY)
Admission: RE | Admit: 2021-01-26 | Discharge: 2021-01-26 | Disposition: A | Discharge status: Home / Self Care | Payer: Medicare Other | Source: Ambulatory Visit | Attending: Internal Medicine | Admitting: Internal Medicine

## 2021-01-26 NOTE — Patient Instructions (Signed)
   Your procedure is scheduled on: 01/28/2021  Report to [facility] at   11:00  AM.  Call this number if you have problems the morning of surgery: [phone]   Remember:              Follow Directions on the letter you received from Your Physician's office regarding the Bowel Prep              No Smoking the day of Procedure :   Take these medicines the morning of surgery with A SIP OF WATER: none   Do not wear jewelry, make-up or nail polish.    Do not bring valuables to the hospital.  Contacts, dentures or bridgework may not be worn into surgery.  .   Patients discharged the day of surgery will not be allowed to drive home.     Flexable sigmoidoscopy, Adult, Care After This sheet gives you information about how to care for yourself after your procedure. Your health care provider may also give you more specific instructions. If you have problems or questions, contact your health care provider. What can I expect after the procedure? After the procedure, it is common to have: A small amount of blood in your stool for 24 hours after the procedure. Some gas. Mild abdominal cramping or bloating.  Follow these instructions at home: General instructions  For the first 24 hours after the procedure: Do not drive or use machinery. Do not sign important documents. Do not drink alcohol. Do your regular daily activities at a slower pace than normal. Eat soft, easy-to-digest foods. Rest often. Take over-the-counter or prescription medicines only as told by your health care provider. It is up to you to get the results of your procedure. Ask your health care provider, or the department performing the procedure, when your results will be ready. Relieving cramping and bloating Try walking around when you have cramps or feel bloated. Apply heat to your abdomen as told by your health care provider. Use a heat source that your health care provider recommends, such as a moist heat pack or a  heating pad. Place a towel between your skin and the heat source. Leave the heat on for 20-30 minutes. Remove the heat if your skin turns bright red. This is especially important if you are unable to feel pain, heat, or cold. You may have a greater risk of getting burned. Eating and drinking Drink enough fluid to keep your urine clear or pale yellow. Resume your normal diet as instructed by your health care provider. Avoid heavy or fried foods that are hard to digest. Avoid drinking alcohol for as long as instructed by your health care provider. Contact a health care provider if: You have blood in your stool 2-3 days after the procedure. Get help right away if: You have more than a small spotting of blood in your stool. You pass large blood clots in your stool. Your abdomen is swollen. You have nausea or vomiting. You have a fever. You have increasing abdominal pain that is not relieved with medicine. This information is not intended to replace advice given to you by your health care provider. Make sure you discuss any questions you have with your health care provider. Document Released: 10/12/2003 Document Revised: 11/22/2015 Document Reviewed: 05/11/2015 Elsevier Interactive Patient Education  [employer].

## 2021-01-28 ENCOUNTER — Other Ambulatory Visit: Payer: Self-pay

## 2021-01-28 ENCOUNTER — Encounter (HOSPITAL_COMMUNITY)
Admission: RE | Disposition: A | Discharge status: Home / Self Care | Payer: Self-pay | Source: Home / Self Care | Attending: Internal Medicine

## 2021-01-28 ENCOUNTER — Ambulatory Visit (HOSPITAL_COMMUNITY)
Admission: RE | Admit: 2021-01-28 | Discharge: 2021-01-28 | Disposition: A | Discharge status: Home / Self Care | Payer: Medicare Other | Attending: Internal Medicine | Admitting: Internal Medicine

## 2021-01-28 ENCOUNTER — Ambulatory Visit (HOSPITAL_COMMUNITY): Payer: Medicare Other | Admitting: Anesthesiology

## 2021-01-28 ENCOUNTER — Encounter (HOSPITAL_COMMUNITY): Payer: Self-pay | Admitting: Internal Medicine

## 2021-01-28 DIAGNOSIS — F419 Anxiety disorder, unspecified: Secondary | ICD-10-CM | POA: Diagnosis not present

## 2021-01-28 DIAGNOSIS — K6289 Other specified diseases of anus and rectum: Secondary | ICD-10-CM | POA: Diagnosis not present

## 2021-01-28 DIAGNOSIS — K644 Residual hemorrhoidal skin tags: Secondary | ICD-10-CM | POA: Diagnosis not present

## 2021-01-28 DIAGNOSIS — R194 Change in bowel habit: Secondary | ICD-10-CM | POA: Diagnosis not present

## 2021-01-28 DIAGNOSIS — K5900 Constipation, unspecified: Secondary | ICD-10-CM

## 2021-01-28 DIAGNOSIS — K602 Anal fissure, unspecified: Secondary | ICD-10-CM

## 2021-01-28 HISTORY — PX: FLEXIBLE SIGMOIDOSCOPY: SHX5431

## 2021-01-28 SURGERY — SIGMOIDOSCOPY, FLEXIBLE
Anesthesia: General

## 2021-01-28 MED ORDER — LACTATED RINGERS IV SOLN: INTRAVENOUS | Status: DC | PRN

## 2021-01-28 MED ORDER — PROPOFOL 10 MG/ML IV BOLUS
INTRAVENOUS | Status: DC | PRN
  Administered 2021-01-28: 100 mg via INTRAVENOUS

## 2021-01-28 MED ORDER — PROPOFOL 500 MG/50ML IV EMUL
INTRAVENOUS | Status: DC | PRN
  Administered 2021-01-28: 150 ug/kg/min via INTRAVENOUS

## 2021-01-28 MED ORDER — SODIUM CHLORIDE 0.9 % IV SOLN: INTRAVENOUS | Status: DC

## 2021-01-28 NOTE — Transfer of Care (Signed)
Immediate Anesthesia Transfer of Care Note  Patient: Cynthia Cochran  Procedure(s) Performed: FLEXIBLE SIGMOIDOSCOPY WITH PROPOFOL  Patient Location: PACU  Anesthesia Type:General  Level of Consciousness: awake, alert , oriented and patient cooperative  Airway & Oxygen Therapy: Patient Spontanous Breathing  Post-op Assessment: Report given to RN, Post -op Vital signs reviewed and stable and Patient moving all extremities X 4  Post vital signs: Reviewed and stable  Last Vitals:  Vitals Value Taken Time  BP    Temp    Pulse    Resp    SpO2      Last Pain:  Vitals:   01/28/21 1229  TempSrc:   PainSc: 0-No pain         Complications: No notable events documented.

## 2021-01-28 NOTE — Discharge Instructions (Addendum)
Resume usual medications and diet as before. No driving for 24 hours. Will arrange for anorectal manometry.  Office will schedule and call you.

## 2021-01-28 NOTE — Op Note (Signed)
[facility] Patient Name: Cynthia Cochran Procedure Date: 01/28/2021 11:47 AM MRN: [id number] Date of Birth: 06-24-1950 Attending MD: Hildred Laser , MD CSN: [id number] Age: 70 Admit Type: Outpatient Procedure:                Flexible Sigmoidoscopy Indications:              Anal pain, Rectal pain Providers:                Hildred Laser, MD, Rosina Lowenstein, RN, Casimer Bilis, Technician Referring MD:             Delphina Cahill, MD Medicines:                Propofol per Anesthesia, None Complications:            No immediate complications. Estimated Blood Loss:     Estimated blood loss: none. Estimated blood loss:                            none. Procedure:                Pre-Anesthesia Assessment:                           - Prior to the procedure, a History and Physical                            was performed, and patient medications and                            allergies were reviewed. The patient's tolerance of                            previous anesthesia was also reviewed. The risks                            and benefits of the procedure and the sedation                            options and risks were discussed with the patient.                            All questions were answered, and informed consent                            was obtained. Prior Anticoagulants: The patient has                            taken no previous anticoagulant or antiplatelet                            agents. ASA Grade Assessment: II - A patient with  mild systemic disease. After reviewing the risks                            and benefits, the patient was deemed in                            satisfactory condition to undergo the procedure.                           After obtaining informed consent, the scope was                            passed under direct vision. The [id number]-HQ190L                            [id number]) scope was introduced  through the anus and                            advanced to the the sigmoid colon. The flexible                            sigmoidoscopy was accomplished without difficulty.                            The patient tolerated the procedure well. The                            quality of the bowel preparation was excellent. Scope In: 12:37:02 PM Scope Out: 12:40:25 PM Total Procedure Duration: 0 hours 3 minutes 23 seconds  Findings:      The perianal and digital rectal examinations were normal.      The mid sigmoid colon and distal sigmoid colon appeared normal.      The rectum appeared normal.      External hemorrhoids were found during retroflexion. The hemorrhoids       were small.      Anal papilla(e) were hypertrophied. Impression:               - Extyernal hemorrhoids and anal papille otherwise                            normal examination.                           - No specimens collected.                           comment: no evidence of anal fissure. Moderate Sedation:      Per Anesthesia Care Recommendation:           - Discharge patient to home (with escort).                           - Resume previous diet.                           - Continue present medications.                           -  Perform anal manometry at appointment to be                            scheduled. Procedure Code(s):        --- Professional ---                           [phone], Sigmoidoscopy, flexible; diagnostic,                            including collection of specimen(s) by brushing or                            washing, when performed (separate procedure) Diagnosis Code(s):        --- Professional ---                           K62.89, Other specified diseases of anus and rectum CPT copyright 2019 American Medical Association. All rights reserved. The codes documented in this report are preliminary and upon coder review may  be revised to meet current compliance requirements. Lionel December,  MD Lionel December, MD 01/28/2021 12:50:12 PM This report has been signed electronically. Number of Addenda: 0

## 2021-01-28 NOTE — H&P (Signed)
Cynthia Cochran is an 70 y.o. female.   Chief Complaint: Patient is here for flexible sigmoidoscopy HPI: Patient is 70 year old Caucasian female who was seen in the office for irregular bowel movements and anorectal discomfort.  She felt as if she she is always sitting in a ball.  Digital exam revealed increased sphincter tone and discomfort.  Stool was guaiac negative.  I felt her symptoms are secondary to anal fissure.  She has been treated with diltiazem gel.  She has been taking Metamucil and polyethylene glycol daily and her bowels are regular.  However her symptoms have not resolved.  She is therefore undergoing diagnostic flexible sigmoidoscopy under monitored anesthesia care. She denies rectal bleeding melena hematuria or vaginal discharge.  Last colonoscopy in July 2020 revealed diverticuli at sigmoid colon hepatic flexure and external hemorrhoids.  Past Medical History:  Diagnosis Date   Anxiety    Arthritis    Blood transfusion without reported diagnosis 1982   after c section   Dysphagia 04/25/2016   Gastric ulcer    GERD (gastroesophageal reflux disease)    Heart murmur    High cholesterol 04/25/2016   Hyperlipidemia    Mixed stress and urge urinary incontinence 09/09/2015   Sciatic nerve pain    Spinal stenosis     Past Surgical History:  Procedure Laterality Date   BIOPSY  05/12/2016   Procedure: BIOPSY;  Surgeon: Malissa Hippo, MD;  Location: [facility];  Service: Endoscopy;;  gastric   BIOPSY  10/04/2018   Procedure: BIOPSY;  Surgeon: Malissa Hippo, MD;  [facility]: [facility];  Service: Endoscopy;;  gastric    CARPAL TUNNEL RELEASE     right; 1998, left 1998   CESAREAN SECTION  1982   COLONOSCOPY WITH PROPOFOL N/A 10/04/2018   Procedure: COLONOSCOPY WITH PROPOFOL;  Surgeon: Malissa Hippo, MD;  Location: [facility];  Service: Endoscopy;  Laterality: N/A;  7:30   Colonscopy     ESOPHAGEAL DILATION N/A 05/12/2016   Procedure: ESOPHAGEAL DILATION;  Surgeon:  Malissa Hippo, MD;  [facility]: [facility];  Service: Endoscopy;  Laterality: N/A;   ESOPHAGOGASTRODUODENOSCOPY N/A 05/12/2016   Procedure: ESOPHAGOGASTRODUODENOSCOPY (EGD);  Surgeon: Malissa Hippo, MD;  [facility]: [facility];  Service: Endoscopy;  Laterality: N/A;  9:30   ESOPHAGOGASTRODUODENOSCOPY (EGD) WITH PROPOFOL N/A 10/04/2018   Procedure: ESOPHAGOGASTRODUODENOSCOPY (EGD) WITH PROPOFOL;  Surgeon: Malissa Hippo, MD;  Location: [facility];  Service: Endoscopy;  Laterality: N/A;   EYE SURGERY     lasix   JOINT REPLACEMENT     both hips   LUMBAR LAMINECTOMY/DECOMPRESSION MICRODISCECTOMY N/A 08/05/2020   Procedure: LAMINECTOMY THORACIC TEN-ELEVEN;  Surgeon: Coletta Memos, MD;  Location: [facility];  Service: Neurosurgery;  Laterality: N/A;   LUMBAR LAMINECTOMY/DECOMPRESSION MICRODISCECTOMY N/A 10/14/2020   Procedure: Right Lumbar 4-5, Lumbar 5 Sacral 1 Lumbar and thoracic laminectomy;  Surgeon: Coletta Memos, MD;  Location: [facility];  Service: Neurosurgery;  Laterality: N/A;  Right Lumbar 4-5, Lumbar 5 Sacral 1 Lumbar and thoracic laminectomy   TOTAL HIP ARTHROPLASTY     right; March 2013; left March 2011    Family History  Problem Relation Age of Onset   Colon cancer Father 31   Colon cancer Brother 87   Other Mother        heavy smoker; died from arterial sclerosis   Hypertension Sister    Stomach cancer Neg Hx    Social History:  reports that she has never smoked. She  has never used smokeless tobacco. She reports that she does not currently use alcohol. She reports that she does not use drugs.  Allergies:  Allergies  Allergen Reactions   Gabapentin Other (See Comments)    Feet went numb   Lyrica [Pregabalin] Other (See Comments)    Feet numb   Mobic [Meloxicam] Other (See Comments)    Avoid due to a history of ulcers    Nsaids Other (See Comments)    Avoid due to a history of ulcers     Medications Prior to Admission  Medication Sig Dispense Refill   acetaminophen  (TYLENOL) 650 MG CR tablet Take 1,300 mg by mouth every 8 (eight) hours as needed for pain.     atorvastatin (LIPITOR) 80 MG tablet Take 80 mg by mouth at bedtime.      Cholecalciferol (DIALYVITE VITAMIN D 5000) 125 MCG (5000 UT) capsule Take 5,000 Units by mouth daily.     COLLAGEN PO Take 1 capsule by mouth daily.     DULoxetine (CYMBALTA) 30 MG capsule Take 30 mg by mouth daily.     escitalopram (LEXAPRO) 20 MG tablet Take 20 mg by mouth daily.     ezetimibe (ZETIA) 10 MG tablet Take 10 mg by mouth at bedtime.      HYDROcodone-acetaminophen (NORCO/VICODIN) 5-325 MG tablet Take 1 tablet by mouth 3 (three) times daily as needed for moderate pain.     LORazepam (ATIVAN) 2 MG tablet Take 2 mg by mouth at bedtime.  2   losartan (COZAAR) 25 MG tablet Take 25 mg by mouth daily.     Magnesium Hydroxide (DULCOLAX SOFT CHEWS) 1200 MG CHEW Chew 1,200 mg by mouth daily as needed (constipation).     polyethylene glycol (MIRALAX / GLYCOLAX) 17 g packet Take 17 g by mouth daily. One capful daily in coffee     Psyllium (METAMUCIL PO) Take 1 Dose by mouth daily. 1 dose = 2 teaspoons     shark liver oil-cocoa butter (PREPARATION H) 0.25-3-85.5 % suppository Place 1 suppository rectally 3 (three) times daily as needed for hemorrhoids.     Specialty Vitamins Products (BIOTIN PLUS KERATIN PO) Take 1 capsule by mouth daily.     Turmeric Curcumin 500 MG CAPS Take 500 mg by mouth daily.     diltiazem 2 % GEL Apply 1 application topically 2 (two) times daily. (Patient not taking: No sig reported) 30 g 1   escitalopram (LEXAPRO) 10 MG tablet TAKE 1 TABLET BY MOUTH DAILY. INCREASE FROM 5 MG (Patient not taking: No sig reported) 90 tablet 0   psyllium (METAMUCIL SMOOTH TEXTURE) 58.6 % powder Take 1 packet by mouth at bedtime. (Patient not taking: No sig reported) 283 g 12    No results found for this or any previous visit (from the past 48 hour(s)). No results found.  Review of Systems  Blood pressure (!) 170/76,  pulse 63, temperature 98.6 F (37 C), temperature source Oral, resp. rate 16, SpO2 99 %. Physical Exam HENT:     Mouth/Throat:     Mouth: Mucous membranes are moist.     Pharynx: Oropharynx is clear.  Eyes:     General: No scleral icterus.    Conjunctiva/sclera: Conjunctivae normal.  Cardiovascular:     Rate and Rhythm: Normal rate and regular rhythm.     Heart sounds: Normal heart sounds. No murmur heard. Pulmonary:     Effort: Pulmonary effort is normal.     Breath sounds: Normal breath sounds.  Abdominal:     General: There is no distension.     Palpations: Abdomen is soft. There is no mass.     Tenderness: There is no abdominal tenderness.  Musculoskeletal:     Cervical back: Neck supple.  Lymphadenopathy:     Cervical: No cervical adenopathy.  Neurological:     Mental Status: She is alert.     Assessment/Plan  Anorectal discomfort. Symptoms suggestive of anal fissure but patient has not responded to medical therapy. Diagnostic flexible sigmoidoscopy under monitored anesthesia care.   Hildred Laser, MD 01/28/2021, 12:22 PM

## 2021-01-28 NOTE — Anesthesia Preprocedure Evaluation (Signed)
Anesthesia Evaluation  Patient identified by MRN, date of birth, ID band Patient awake    Reviewed: Allergy & Precautions, H&P , NPO status , Patient's Chart, lab work & pertinent test results, reviewed documented beta blocker date and time   Airway Mallampati: II  TM Distance: >3 FB Neck ROM: full    Dental no notable dental hx.    Pulmonary neg pulmonary ROS,    Pulmonary exam normal breath sounds clear to auscultation       Cardiovascular Exercise Tolerance: Good negative cardio ROS   Rhythm:regular Rate:Normal     Neuro/Psych Anxiety  Neuromuscular disease negative psych ROS   GI/Hepatic Neg liver ROS, PUD, GERD  Medicated,  Endo/Other  negative endocrine ROS  Renal/GU negative Renal ROS  negative genitourinary   Musculoskeletal   Abdominal   Peds  Hematology  (+) Blood dyscrasia, anemia ,   Anesthesia Other Findings   Reproductive/Obstetrics negative OB ROS                             Anesthesia Physical Anesthesia Plan  ASA: 2  Anesthesia Plan: General   Post-op Pain Management:    Induction:   PONV Risk Score and Plan: Propofol infusion  Airway Management Planned:   Additional Equipment:   Intra-op Plan:   Post-operative Plan:   Informed Consent: I have reviewed the patients History and Physical, chart, labs and discussed the procedure including the risks, benefits and alternatives for the proposed anesthesia with the patient or authorized representative who has indicated his/her understanding and acceptance.     Dental Advisory Given  Plan Discussed with: CRNA  Anesthesia Plan Comments:         Anesthesia Quick Evaluation

## 2021-01-28 NOTE — Anesthesia Postprocedure Evaluation (Signed)
Anesthesia Post Note  Patient: Cynthia Cochran  Procedure(s) Performed: FLEXIBLE SIGMOIDOSCOPY WITH PROPOFOL  Patient location during evaluation: Phase II Anesthesia Type: General Level of consciousness: awake Pain management: pain level controlled Vital Signs Assessment: post-procedure vital signs reviewed and stable Respiratory status: spontaneous breathing and respiratory function stable Cardiovascular status: blood pressure returned to baseline and stable Postop Assessment: no headache and no apparent nausea or vomiting Anesthetic complications: no Comments: Late entry   No notable events documented.   Last Vitals:  Vitals:   01/28/21 1129 01/28/21 1246  BP: (!) 170/76 131/75  Pulse: 63 66  Resp: 16 17  Temp: 37 C   SpO2: 99% 100%    Last Pain:  Vitals:   01/28/21 1246  TempSrc:   PainSc: 0-No pain                 Windell Norfolk

## 2021-02-01 ENCOUNTER — Encounter (HOSPITAL_COMMUNITY): Payer: Self-pay | Admitting: Internal Medicine

## 2021-02-08 DIAGNOSIS — M4726 Other spondylosis with radiculopathy, lumbar region: Secondary | ICD-10-CM | POA: Diagnosis not present

## 2021-02-09 ENCOUNTER — Telehealth (INDEPENDENT_AMBULATORY_CARE_PROVIDER_SITE_OTHER): Payer: Self-pay | Admitting: *Deleted

## 2021-02-09 NOTE — Telephone Encounter (Signed)
Pt.notified

## 2021-02-09 NOTE — Telephone Encounter (Signed)
Cynthia Cochran, Pt had a procedure done on 11/18. States Dr. Karilyn Cota was going to do referral to [facility] for her but was going to check with [facility] to see if they do that procedure. I saw this in his note: Perform anal manometry at appointment to be                            scheduled.  Not sure if Dr. Karilyn Cota put in order for referral or if [facility] does this procedure? Will you look at this and let me know what to tell patient about referral. Thanks

## 2021-02-15 ENCOUNTER — Other Ambulatory Visit: Payer: Self-pay

## 2021-02-15 ENCOUNTER — Ambulatory Visit (INDEPENDENT_AMBULATORY_CARE_PROVIDER_SITE_OTHER): Payer: Medicare Other | Admitting: Psychiatry

## 2021-02-15 ENCOUNTER — Ambulatory Visit
Admission: RE | Admit: 2021-02-15 | Discharge: 2021-02-15 | Disposition: A | Discharge status: Home / Self Care | Payer: Medicare Other | Source: Ambulatory Visit | Attending: Internal Medicine | Admitting: Internal Medicine

## 2021-02-15 ENCOUNTER — Encounter (HOSPITAL_COMMUNITY): Payer: Self-pay | Admitting: Psychiatry

## 2021-02-15 DIAGNOSIS — F4323 Adjustment disorder with mixed anxiety and depressed mood: Secondary | ICD-10-CM

## 2021-02-15 DIAGNOSIS — Z1231 Encounter for screening mammogram for malignant neoplasm of breast: Secondary | ICD-10-CM | POA: Diagnosis not present
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
## 2021-02-21 ENCOUNTER — Ambulatory Visit (INDEPENDENT_AMBULATORY_CARE_PROVIDER_SITE_OTHER): Payer: Medicare Other | Admitting: Gastroenterology

## 2021-02-21 DIAGNOSIS — K5900 Constipation, unspecified: Secondary | ICD-10-CM | POA: Diagnosis not present

## 2021-02-21 DIAGNOSIS — K6289 Other specified diseases of anus and rectum: Secondary | ICD-10-CM | POA: Diagnosis not present

## 2021-02-21 DIAGNOSIS — K5909 Other constipation: Secondary | ICD-10-CM | POA: Diagnosis not present

## 2021-02-23 DIAGNOSIS — M9902 Segmental and somatic dysfunction of thoracic region: Secondary | ICD-10-CM | POA: Diagnosis not present

## 2021-02-23 DIAGNOSIS — M5136 Other intervertebral disc degeneration, lumbar region: Secondary | ICD-10-CM | POA: Diagnosis not present

## 2021-02-23 DIAGNOSIS — M9903 Segmental and somatic dysfunction of lumbar region: Secondary | ICD-10-CM | POA: Diagnosis not present

## 2021-02-23 DIAGNOSIS — M9905 Segmental and somatic dysfunction of pelvic region: Secondary | ICD-10-CM | POA: Diagnosis not present

## 2021-02-23 DIAGNOSIS — M546 Pain in thoracic spine: Secondary | ICD-10-CM | POA: Diagnosis not present

## 2021-02-28 ENCOUNTER — Telehealth (INDEPENDENT_AMBULATORY_CARE_PROVIDER_SITE_OTHER): Payer: Self-pay | Admitting: *Deleted

## 2021-02-28 DIAGNOSIS — M5442 Lumbago with sciatica, left side: Secondary | ICD-10-CM | POA: Diagnosis not present

## 2021-02-28 DIAGNOSIS — R202 Paresthesia of skin: Secondary | ICD-10-CM | POA: Diagnosis not present

## 2021-02-28 DIAGNOSIS — I10 Essential (primary) hypertension: Secondary | ICD-10-CM | POA: Diagnosis not present

## 2021-02-28 DIAGNOSIS — F411 Generalized anxiety disorder: Secondary | ICD-10-CM | POA: Diagnosis not present

## 2021-02-28 DIAGNOSIS — N393 Stress incontinence (female) (male): Secondary | ICD-10-CM | POA: Diagnosis not present

## 2021-02-28 DIAGNOSIS — K5900 Constipation, unspecified: Secondary | ICD-10-CM | POA: Diagnosis not present

## 2021-02-28 NOTE — Telephone Encounter (Signed)
Report on dr Patty Sermons desk

## 2021-02-28 NOTE — Telephone Encounter (Signed)
Pt had manometry on 02/21/21 at [facility] and calling today 12/19 for results. Pt aware dr Karilyn Cota is out of office til 12/27. Pt states she still feels like she is sitting on a tennis ball. Having sciatica and leg is numb. She made appt with neuro surgeon at [facility] Dr. Alphonzo Severance. [place] and will see him in February.

## 2021-03-02 ENCOUNTER — Other Ambulatory Visit (INDEPENDENT_AMBULATORY_CARE_PROVIDER_SITE_OTHER): Payer: Self-pay | Admitting: Internal Medicine

## 2021-03-02 ENCOUNTER — Telehealth (INDEPENDENT_AMBULATORY_CARE_PROVIDER_SITE_OTHER): Payer: Self-pay | Admitting: Internal Medicine

## 2021-03-02 DIAGNOSIS — M5136 Other intervertebral disc degeneration, lumbar region: Secondary | ICD-10-CM | POA: Diagnosis not present

## 2021-03-02 DIAGNOSIS — M9903 Segmental and somatic dysfunction of lumbar region: Secondary | ICD-10-CM | POA: Diagnosis not present

## 2021-03-02 DIAGNOSIS — M546 Pain in thoracic spine: Secondary | ICD-10-CM | POA: Diagnosis not present

## 2021-03-02 DIAGNOSIS — M9902 Segmental and somatic dysfunction of thoracic region: Secondary | ICD-10-CM | POA: Diagnosis not present

## 2021-03-02 DIAGNOSIS — M9905 Segmental and somatic dysfunction of pelvic region: Secondary | ICD-10-CM | POA: Diagnosis not present

## 2021-03-02 MED ORDER — AMITRIPTYLINE HCL 10 MG PO TABS: 20.0000 mg | ORAL_TABLET | Freq: Every day | ORAL | 1 refills | Status: DC

## 2021-03-02 NOTE — Telephone Encounter (Signed)
Results of anorectal manometry reviewed with patient. She does not have pelvic floor dyssynergia. She developed discomfort with balloon distention to 200 cc. She possibly has hypersensitivity. We will treat her with low-dose amitriptyline. She will take 10 mg by mouth daily at bedtime for 1 week and if she has no side effects she will increase dose to 20 mg daily at bedtime. Prescription sent to her pharmacy. Patient is aware of potential side effects. Patient will call with progress report in 3 to 4 weeks. If symptoms persist will proceed with pelvic CT.

## 2021-03-03 NOTE — Telephone Encounter (Signed)
Dr. Karilyn Cota called and spoke with pt about results of manometry done at [facility].

## 2021-03-09 DIAGNOSIS — M9905 Segmental and somatic dysfunction of pelvic region: Secondary | ICD-10-CM | POA: Diagnosis not present

## 2021-03-09 DIAGNOSIS — M546 Pain in thoracic spine: Secondary | ICD-10-CM | POA: Diagnosis not present

## 2021-03-09 DIAGNOSIS — M5136 Other intervertebral disc degeneration, lumbar region: Secondary | ICD-10-CM | POA: Diagnosis not present

## 2021-03-09 DIAGNOSIS — M9903 Segmental and somatic dysfunction of lumbar region: Secondary | ICD-10-CM | POA: Diagnosis not present

## 2021-03-09 DIAGNOSIS — M9902 Segmental and somatic dysfunction of thoracic region: Secondary | ICD-10-CM | POA: Diagnosis not present

## 2021-03-10 ENCOUNTER — Other Ambulatory Visit: Payer: Self-pay

## 2021-03-10 ENCOUNTER — Ambulatory Visit (INDEPENDENT_AMBULATORY_CARE_PROVIDER_SITE_OTHER): Payer: Medicare Other | Admitting: Psychiatry

## 2021-03-10 DIAGNOSIS — R262 Difficulty in walking, not elsewhere classified: Secondary | ICD-10-CM | POA: Diagnosis not present

## 2021-03-10 DIAGNOSIS — M4726 Other spondylosis with radiculopathy, lumbar region: Secondary | ICD-10-CM | POA: Diagnosis not present

## 2021-03-10 DIAGNOSIS — M6281 Muscle weakness (generalized): Secondary | ICD-10-CM | POA: Diagnosis not present

## 2021-03-10 DIAGNOSIS — R202 Paresthesia of skin: Secondary | ICD-10-CM | POA: Diagnosis not present

## 2021-03-10 DIAGNOSIS — F4323 Adjustment disorder with mixed anxiety and depressed mood: Secondary | ICD-10-CM
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
## 2021-03-15 DIAGNOSIS — M5416 Radiculopathy, lumbar region: Secondary | ICD-10-CM | POA: Diagnosis not present

## 2021-03-15 DIAGNOSIS — I10 Essential (primary) hypertension: Secondary | ICD-10-CM | POA: Diagnosis not present

## 2021-03-15 DIAGNOSIS — M4714 Other spondylosis with myelopathy, thoracic region: Secondary | ICD-10-CM | POA: Diagnosis not present

## 2021-03-15 DIAGNOSIS — Z6827 Body mass index (BMI) 27.0-27.9, adult: Secondary | ICD-10-CM | POA: Diagnosis not present

## 2021-03-22 DIAGNOSIS — M4714 Other spondylosis with myelopathy, thoracic region: Secondary | ICD-10-CM | POA: Diagnosis not present

## 2021-03-22 DIAGNOSIS — M5124 Other intervertebral disc displacement, thoracic region: Secondary | ICD-10-CM | POA: Diagnosis not present

## 2021-03-24 ENCOUNTER — Ambulatory Visit (INDEPENDENT_AMBULATORY_CARE_PROVIDER_SITE_OTHER): Payer: Medicare Other | Admitting: Psychiatry

## 2021-03-24 ENCOUNTER — Encounter (HOSPITAL_COMMUNITY): Payer: Self-pay

## 2021-03-24 ENCOUNTER — Other Ambulatory Visit: Payer: Self-pay

## 2021-03-24 DIAGNOSIS — F4323 Adjustment disorder with mixed anxiety and depressed mood: Secondary | ICD-10-CM
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
## 2021-03-24 NOTE — Plan of Care (Signed)
Pt participated in development of plan 

## 2021-03-31 DIAGNOSIS — Z6827 Body mass index (BMI) 27.0-27.9, adult: Secondary | ICD-10-CM | POA: Diagnosis not present

## 2021-03-31 DIAGNOSIS — I10 Essential (primary) hypertension: Secondary | ICD-10-CM | POA: Diagnosis not present

## 2021-03-31 DIAGNOSIS — M4714 Other spondylosis with myelopathy, thoracic region: Secondary | ICD-10-CM | POA: Diagnosis not present

## 2021-04-05 ENCOUNTER — Encounter (INDEPENDENT_AMBULATORY_CARE_PROVIDER_SITE_OTHER): Payer: Self-pay | Admitting: Internal Medicine

## 2021-04-05 ENCOUNTER — Other Ambulatory Visit: Payer: Self-pay

## 2021-04-05 ENCOUNTER — Ambulatory Visit (INDEPENDENT_AMBULATORY_CARE_PROVIDER_SITE_OTHER): Payer: Medicare Other | Admitting: Internal Medicine

## 2021-04-05 VITALS — BP 118/67 | HR 71 | Temp 98.9°F | Ht 62.0 in | Wt 153.8 lb

## 2021-04-05 DIAGNOSIS — M7542 Impingement syndrome of left shoulder: Secondary | ICD-10-CM | POA: Diagnosis not present

## 2021-04-05 DIAGNOSIS — K6289 Other specified diseases of anus and rectum: Secondary | ICD-10-CM | POA: Diagnosis not present

## 2021-04-05 DIAGNOSIS — M19012 Primary osteoarthritis, left shoulder: Secondary | ICD-10-CM | POA: Diagnosis not present

## 2021-04-05 DIAGNOSIS — K5900 Constipation, unspecified: Secondary | ICD-10-CM

## 2021-04-05 MED ORDER — AMITRIPTYLINE HCL 50 MG PO TABS: 50.0000 mg | ORAL_TABLET | Freq: Every day | ORAL | 1 refills | Status: DC

## 2021-04-05 NOTE — Patient Instructions (Signed)
Take amitriptyline 50 mg by mouth daily at bedtime. If this dose does not help alleviate the rectal discomfort can increase dose to 75 mg by mouth daily at bedtime. Please call office when you increase the dose we can update the records.

## 2021-04-05 NOTE — Progress Notes (Signed)
Presenting complaint;  Follow-up for rectal pain. History of constipation.  Database and subjective:  Patient is 71 year old Caucasian female who is here for scheduled visit.  She was last seen in the office in October 2022 for rectal discomfort and irregular bowel movements.  She felt she is always sitting on a ball.  Digital exam revealed increased anal sphincter tone and some discomfort.  Her symptoms were felt to be due to anal fissure.  She was treated with diltiazem gel Metamucil body clean glycol.  However symptoms did not improve.  She underwent flexible sigmoidoscopy on 01/28/2021 and no abnormality was noted to explain her symptoms.  She then went on to have anorectal manometry last month.  She developed rectal discomfort with balloon distention of 200 mL.  This suggested hypersensitivity.  She did not have pelvic floor dyssynergia.  She was started on amitriptyline at a dose of 10 mg daily and advised to increase dose to 20 mg weekly and if she has no side effects.  She says she is not any better.  She always had rectal discomfort and feels as if she is sitting in a ball.  Her bowels are moving regularly.  She is taking Metamucil and MiraLAX daily.  Occasionally she may have a bowel movement with urination.  She denies abdominal pain melena or rectal bleeding.  Her appetite is good.  Her weight has been stable. She is scheduled for a gynecologic exam by Ms. Derrek Monaco in about 2 weeks. She was seen by her neurosurgeon Dr. Christella Noa last week who agree with therapy with amitriptyline and wanted to take higher dose.   Current Medications: Outpatient Encounter Medications as of 04/05/2021  Medication Sig   acetaminophen (TYLENOL) 650 MG CR tablet Take 1,300 mg by mouth every 8 (eight) hours as needed for pain.   amitriptyline (ELAVIL) 10 MG tablet Take 2 tablets (20 mg total) by mouth at bedtime.   atorvastatin (LIPITOR) 80 MG tablet Take 80 mg by mouth at bedtime.    Cholecalciferol  (DIALYVITE VITAMIN D 5000) 125 MCG (5000 UT) capsule Take 5,000 Units by mouth daily.   COLLAGEN PO Take 1 capsule by mouth daily.   escitalopram (LEXAPRO) 20 MG tablet Take 20 mg by mouth daily.   ezetimibe (ZETIA) 10 MG tablet Take 10 mg by mouth at bedtime.    HYDROcodone-acetaminophen (NORCO/VICODIN) 5-325 MG tablet Take 1 tablet by mouth 3 (three) times daily as needed for moderate pain.   LORazepam (ATIVAN) 2 MG tablet Take 2 mg by mouth at bedtime.   losartan (COZAAR) 25 MG tablet Take 25 mg by mouth daily.   oxybutynin (DITROPAN) 5 MG tablet Take 5 mg by mouth 3 (three) times daily.   polyethylene glycol (MIRALAX / GLYCOLAX) 17 g packet Take 17 g by mouth daily. One capful daily in coffee   Psyllium (METAMUCIL PO) Take 1 Dose by mouth daily. 1 dose = 2 teaspoons   Specialty Vitamins Products (BIOTIN PLUS KERATIN PO) Take 1 capsule by mouth daily.   Turmeric Curcumin 500 MG CAPS Take 500 mg by mouth daily.   [DISCONTINUED] Magnesium Hydroxide (DULCOLAX SOFT CHEWS) 1200 MG CHEW Chew 1,200 mg by mouth daily as needed (constipation). (Patient not taking: Reported on 02/15/2021)   [DISCONTINUED] shark liver oil-cocoa butter (PREPARATION H) 0.25-3-85.5 % suppository Place 1 suppository rectally 3 (three) times daily as needed for hemorrhoids. (Patient not taking: Reported on 02/15/2021)   No facility-administered encounter medications on file as of 04/05/2021.  Objective: Blood pressure 118/67, pulse 71, temperature 98.9 F (37.2 C), temperature source Oral, height _0  (1.575 m), weight 153 lb 12.8 oz (69.8 kg). Patient is alert and in no acute distress Conjunctiva is pink. Sclera is nonicteric Oropharyngeal mucosa is normal. No neck masses or thyromegaly noted. Cardiac exam with regular rhythm normal S1 and S2.  Faint systolic murmur noted at aortic area. Lungs are clear to auscultation. Abdomen is symmetrical soft and nontender with organomegaly or masses. No LE edema or clubbing  noted.  Labs/studies Results:   CBC Latest Ref Rng & Units 10/14/2020 08/04/2020 05/10/2018  WBC 4.0 - 10.5 K/uL 4.8 5.4 4.4  Hemoglobin 12.0 - 15.0 g/dL 13.2 14.0 12.6  Hematocrit 36.0 - 46.0 % 40.3 42.1 38.4  Platelets 150 - 400 K/uL 167 236 211    CMP Latest Ref Rng & Units 10/14/2020 08/04/2020 07/22/2019  Glucose 70 - 99 mg/dL 100(H) 98 -  BUN 8 - 23 mg/dL 15 9 -  Creatinine 0.44 - 1.00 mg/dL 0.61 0.65 0.80  Sodium 135 - 145 mmol/L 138 136 -  Potassium 3.5 - 5.1 mmol/L 3.6 3.7 -  Chloride 98 - 111 mmol/L 106 106 -  CO2 22 - 32 mmol/L 22 25 -  Calcium 8.9 - 10.3 mg/dL 8.8(L) 8.7(L) -  Total Protein 6.5 - 8.1 g/dL - - -  Total Bilirubin 0.3 - 1.2 mg/dL - - -  Alkaline Phos 38 - 126 U/L - - -  AST 15 - 41 U/L - - -  ALT 0 - 44 U/L - - -    Hepatic Function Latest Ref Rng & Units 04/08/2018 04/07/2018  Total Protein 6.5 - 8.1 g/dL 6.5 7.7  Albumin 3.5 - 5.0 g/dL 3.5 4.3  AST 15 - 41 U/L 17 20  ALT 0 - 44 U/L 14 15  Alk Phosphatase 38 - 126 U/L 27(L) 31(L)  Total Bilirubin 0.3 - 1.2 mg/dL 1.0 1.2     Assessment:  #1.  Rectal pain.  Pain is felt to be neuropathic.  Flexible sigmoidoscopy over 2 months ago was unremarkable and anorectal manometry ruled out pelvic floor dyssynergia. So far she has not responded to amitriptyline which she is tolerating well.  Therefore dose will be increased.  #2.  Chronic constipation.  She is doing well with polyethylene glycol and MiraLAX.  Plan:  Increase amitriptyline dose to 50 mg by mouth daily at bedtime.  If rectal pain is not alleviated can increase dose to 75 mg daily at bedtime after 1 to 2 weeks as long as she has no side effects. Patient will call office for an update when doses change. Full review of recent MR to determine if she should have sacral MRI to rule out cauda equina tumor. Office visit in 3 months.

## 2021-04-06 DIAGNOSIS — I10 Essential (primary) hypertension: Secondary | ICD-10-CM | POA: Diagnosis not present

## 2021-04-07 ENCOUNTER — Ambulatory Visit (INDEPENDENT_AMBULATORY_CARE_PROVIDER_SITE_OTHER): Payer: Medicare Other | Admitting: Psychiatry

## 2021-04-07 ENCOUNTER — Other Ambulatory Visit: Payer: Self-pay

## 2021-04-07 DIAGNOSIS — F4323 Adjustment disorder with mixed anxiety and depressed mood: Secondary | ICD-10-CM | POA: Diagnosis not present

## 2021-04-07 NOTE — Progress Notes (Signed)
In - Person  THERAPIST PROGRESS NOTE  Session Time:   Thursday 04/07/2021 9:08 AM - 10:00 AM   Participation Level: Active  Behavioral Response: CasualAlertDepressed  Type of Therapy: Individual Therapy  Treatment Goals addressed: Establish therapeutic alliance, learn and implement cognitive and behavioral strategies to overcome depression  Interventions: CBT and Supportive          Summary: Cynthia Cochran is a 71 y.o. female who is referred for services by nurse practitioner due to experiencing symptoms of anxiety and depression.  She denies any psychiatric hospitalizations.  She participated in grief counseling when her husband died about 6 years ago.  Patient reports she does not know how to deal with her family anymore.  Per her report, they are always expecting things from her particularly her nieces and nephews.  She expresses frustration and sadness as they do not follow through with their promises and can be emotionally abusive.  She states they make her feel like she is the Bible in the family.  Patient reports crying spells, excessive worrying, and secluding self.   Patient last was seen 2 weeks ago. She reports improved mood, decreased crying spells, and increased interest and involvement in activities since last session.  Patient reports continued frustration regarding physical ailments but has started taking amitriptyline to help with some of the nerve pain but also reports this may have helped some with depression.  Patient continues to express sadness and frustration regarding her family but has more realistic expectations.  She initiated contact with her sister and niece recently and reports getting a positive response.  She is pleased about this but still reports difficulty being assertive along with setting and maintaining limits with her family.  She reports increased interest in other activities in contact with others including attending church.  She also plans to complete  application to volunteer at the hospital gift [facility].  Although patient continues to experience significant physical pain, she appears more hopeful about coping with pain as well as scheduling an appointment with a neurologist to try to address.   Suicidal/Homicidal: Negativewithout intent/plan  Therapist Response: Reviewed symptoms, praised and reinforced patient's increased interest/involvement in activities, discussed effects, encourage patient to follow through with application to volunteer, discussed stressors, facilitated expression of thoughts and feelings, validated feelings, assisted patient continue to examine her pattern of interaction with her family, began to assist patient identify thoughts that may inhibit effective assertion,   Plan: Return again in 2 weeks. Diagnosis: Axis I: Adjustment disorder with mixed anxiety and depressed mood     Adah Salvage, LCSW 04/07/2021

## 2021-04-08 ENCOUNTER — Encounter (HOSPITAL_COMMUNITY): Payer: Self-pay

## 2021-04-11 DIAGNOSIS — G629 Polyneuropathy, unspecified: Secondary | ICD-10-CM | POA: Diagnosis not present

## 2021-04-11 DIAGNOSIS — F321 Major depressive disorder, single episode, moderate: Secondary | ICD-10-CM | POA: Diagnosis not present

## 2021-04-11 DIAGNOSIS — R58 Hemorrhage, not elsewhere classified: Secondary | ICD-10-CM | POA: Diagnosis not present

## 2021-04-11 DIAGNOSIS — E782 Mixed hyperlipidemia: Secondary | ICD-10-CM | POA: Diagnosis not present

## 2021-04-11 DIAGNOSIS — M542 Cervicalgia: Secondary | ICD-10-CM | POA: Diagnosis not present

## 2021-04-11 DIAGNOSIS — M5442 Lumbago with sciatica, left side: Secondary | ICD-10-CM | POA: Diagnosis not present

## 2021-04-11 DIAGNOSIS — M25552 Pain in left hip: Secondary | ICD-10-CM | POA: Diagnosis not present

## 2021-04-11 DIAGNOSIS — K277 Chronic peptic ulcer, site unspecified, without hemorrhage or perforation: Secondary | ICD-10-CM | POA: Diagnosis not present

## 2021-04-11 DIAGNOSIS — Z0001 Encounter for general adult medical examination with abnormal findings: Secondary | ICD-10-CM | POA: Diagnosis not present

## 2021-04-11 DIAGNOSIS — E663 Overweight: Secondary | ICD-10-CM | POA: Diagnosis not present

## 2021-04-11 DIAGNOSIS — F411 Generalized anxiety disorder: Secondary | ICD-10-CM | POA: Diagnosis not present

## 2021-04-11 DIAGNOSIS — R32 Unspecified urinary incontinence: Secondary | ICD-10-CM | POA: Diagnosis not present

## 2021-04-11 DIAGNOSIS — M4802 Spinal stenosis, cervical region: Secondary | ICD-10-CM | POA: Diagnosis not present

## 2021-04-15 ENCOUNTER — Encounter: Payer: Self-pay | Admitting: Adult Health

## 2021-04-15 ENCOUNTER — Other Ambulatory Visit: Payer: Self-pay

## 2021-04-15 ENCOUNTER — Ambulatory Visit (INDEPENDENT_AMBULATORY_CARE_PROVIDER_SITE_OTHER): Payer: Medicare Other | Admitting: Adult Health

## 2021-04-15 VITALS — BP 142/85 | HR 69 | Ht 61.5 in | Wt 153.0 lb

## 2021-04-15 DIAGNOSIS — N3946 Mixed incontinence: Secondary | ICD-10-CM

## 2021-04-15 DIAGNOSIS — N8111 Cystocele, midline: Secondary | ICD-10-CM

## 2021-04-15 DIAGNOSIS — Z780 Asymptomatic menopausal state: Secondary | ICD-10-CM

## 2021-04-15 NOTE — Progress Notes (Signed)
Subjective:     Patient ID: Cynthia Cochran, female   DOB: [birth year], 71 y.o.   MRN: [id number]  HPI Cynthia Cochran is a 71 year old white female, widowed, PM, in complaining of feeling like sitting on a ball and has mixed UI, is on ditropan. She has had 2 back surgeries and has had constipation but is on metamucil and miralax and going good now.  Has had colonoscopy by Dr Karilyn Cota, in the past. PCP is Dr Margo Aye.   Review of Systems Feels like sitting on a ball BMs good now Has urinary incontinence if coughs or sneezes is on oxybutynin and that has helped some with urge incontinence    Reviewed past medical,surgical, social and family history. Reviewed medications and allergies.  Objective:   Physical Exam BP (!) 142/85 (BP Location: Left Arm, Patient Position: Sitting, Cuff Size: Normal)    Pulse 69    Ht 5' 1.5" (1.562 m)    Wt 153 lb (69.4 kg)    BMI 28.44 kg/m     Skin warm and dry.Pelvic: external genitalia is normal in appearance no lesions, vagina: pale with loss of rugae, has cystocele and pelvic relaxation,urethra has no lesions or masses noted, cervix:smooth and bulbous, uterus: normal size, shape and contour, non tender, no masses felt, adnexa: no masses or tenderness noted. Bladder is non tender and no masses felt. On rectal exam has good tone, no masses felt. AA is 2 Fall risk is low Depression screen [facility] 2/9 04/15/2021 11/06/2017  Decreased Interest 0 1  Down, Depressed, Hopeless 0 1  PHQ - 2 Score 0 2  Altered sleeping 0 1  Tired, decreased energy 0 0  Change in appetite 0 0  Feeling bad or failure about yourself  0 1  Trouble concentrating 0 0  Moving slowly or fidgety/restless 0 0  Suicidal thoughts 0 0  PHQ-9 Score 0 4  Difficult doing work/chores - Not difficult at all  Some encounter information is confidential and restricted. Go to Review Flowsheets activity to see all data.    GAD 7 : Generalized Anxiety Score 04/15/2021  Nervous, Anxious, on Edge 1  Control/stop worrying 1   Worry too much - different things 1  Trouble relaxing 0  Restless 0  Easily annoyed or irritable 0  Afraid - awful might happen 0  Total GAD 7 Score 3  Some encounter information is confidential and restricted. Go to Review Flowsheets activity to see all data.      Upstream - 04/15/21 1016       Pregnancy Intention Screening   Does the patient want to become pregnant in the next year? N/A    Does the patient's partner want to become pregnant in the next year? N/A    Would the patient like to discuss contraceptive options today? N/A      Contraception Wrap Up   Current Method Abstinence   postmenopausal   End Method Abstinence   postmenopausal   Contraception Counseling Provided No            Examination chaperoned by Malachy Mood LPN  Assessment:     1. Pelvic relaxation due to cystocele, midline Discussed pelvic relaxation with her  Discussed pessary as option, gave her medical explainer #4 and handout on pessary Will bring back next week for Dr Charlotta Newton to do pessary fitting  2. Mixed stress and urge urinary incontinence Continue oxybutynin   3. Postmenopausal     Plan:     Return 04/20/21  with Dr Charlotta Newton for pessary fitting

## 2021-04-18 DIAGNOSIS — M5416 Radiculopathy, lumbar region: Secondary | ICD-10-CM | POA: Diagnosis not present

## 2021-04-20 ENCOUNTER — Ambulatory Visit (INDEPENDENT_AMBULATORY_CARE_PROVIDER_SITE_OTHER): Payer: Medicare Other | Admitting: Obstetrics & Gynecology

## 2021-04-20 ENCOUNTER — Encounter: Payer: Self-pay | Admitting: Obstetrics & Gynecology

## 2021-04-20 ENCOUNTER — Other Ambulatory Visit: Payer: Self-pay

## 2021-04-20 VITALS — BP 145/76 | HR 74 | Ht 62.5 in | Wt 151.4 lb

## 2021-04-20 DIAGNOSIS — Z780 Asymptomatic menopausal state: Secondary | ICD-10-CM

## 2021-04-20 DIAGNOSIS — N952 Postmenopausal atrophic vaginitis: Secondary | ICD-10-CM

## 2021-04-20 DIAGNOSIS — N8110 Cystocele, unspecified: Secondary | ICD-10-CM | POA: Diagnosis not present

## 2021-04-20 DIAGNOSIS — N3946 Mixed incontinence: Secondary | ICD-10-CM

## 2021-04-20 NOTE — Progress Notes (Signed)
° °  GYN VISIT Patient name: Cynthia Cochran MRN [id number]  Date of birth: 07/04/1950 Chief Complaint:   Pessary Fitting  History of Present Illness:   Cynthia Cochran is a 71 y.o. [phone] PM female being seen today for the following concerns:  Pelvic concerns: Seen by Lavone Nian 04/15/21- records reviewed- reported mixed UI and feeling like she is sitting on a ball that started in May.   Today she states it is not painful, just feels "like a wedgy" all the time even when standing.  Feels better when she is lying down. Initially she thought it was due to her back- however after back surgery she did not have any improvement.  Denies vaginal bleeding, discharge, itch or irritation.  Denies pelvic pain.  Denies nocturia.  Mostly stress incontinence, occasional urge incontinence.  Taking oxybutynin with some improvement   No LMP recorded. Patient is postmenopausal.  Depression screen [facility] 2/9 04/15/2021 11/06/2017  Decreased Interest 0 1  Down, Depressed, Hopeless 0 1  PHQ - 2 Score 0 2  Altered sleeping 0 1  Tired, decreased energy 0 0  Change in appetite 0 0  Feeling bad or failure about yourself  0 1  Trouble concentrating 0 0  Moving slowly or fidgety/restless 0 0  Suicidal thoughts 0 0  PHQ-9 Score 0 4  Difficult doing work/chores - Not difficult at all  Some encounter information is confidential and restricted. Go to Review Flowsheets activity to see all data.     Review of Systems:   Pertinent items are noted in HPI Denies fever/chills, dizziness, headaches, visual disturbances, fatigue, shortness of breath, chest pain, abdominal pain, vomiting, bowel movements or intercourse unless otherwise stated above.  Pertinent History Reviewed:  Reviewed past medical,surgical, social, obstetrical and family history.  Reviewed problem list, medications and allergies. Physical Assessment:   Vitals:   04/20/21 1132  BP: (!) 145/76  Pulse: 74  Weight: 151 lb 6.4 oz (68.7 kg)  Height: 5' 2.5"  (1.588 m)  Body mass index is 27.25 kg/m.       Physical Examination:   General appearance: alert, well appearing, and in no distress  Psych: mood appropriate, normal affect  Skin: warm & dry   Cardiovascular: normal heart rate noted  Respiratory: normal respiratory effort, no distress  Abdomen: soft, non-tender   Pelvic: VULVA: normal appearing vulva with no masses, tenderness or lesions, VAGINA: normal appearing vagina with normal color and discharge, no lesions, CERVIX: normal appearing cervix without discharge or lesions, UTERUS: uterus is normal size, shape, consistency and nontender, no bulge appreciated, Stage 1 cystocele noted with valsalva/cough  Extremities: no edema   Chaperone: Angel Neas    Assessment & Plan:  1) Stage 1 cystocele -reviewed findings on exam -discussed conservative options including therapy, pessary and/or surgical intervention -Pt wishes to consider pessary -Pessary fitting attempted- milex ring #1 placed- pt did not feel like it was helping, desired removal -Based on degree of cystocele, felt that pelvic floor therapy would be ideal option- desire to proceed -pt aware that treatment will be in [facility], DO Attending [place], [place] for [place]

## 2021-04-21 ENCOUNTER — Other Ambulatory Visit: Payer: Self-pay | Admitting: *Deleted

## 2021-04-21 ENCOUNTER — Ambulatory Visit (INDEPENDENT_AMBULATORY_CARE_PROVIDER_SITE_OTHER): Payer: Medicare Other | Admitting: Psychiatry

## 2021-04-21 DIAGNOSIS — F4323 Adjustment disorder with mixed anxiety and depressed mood: Secondary | ICD-10-CM

## 2021-04-21 DIAGNOSIS — N8110 Cystocele, unspecified: Secondary | ICD-10-CM

## 2021-04-21 DIAGNOSIS — N3946 Mixed incontinence: Secondary | ICD-10-CM

## 2021-04-21 NOTE — Progress Notes (Signed)
In - Person  THERAPIST PROGRESS NOTE  Session Time:   Thursday  04/21/2021 9:08 AM - 10:00 AM   Participation Level: Active  Behavioral Response: CasualAlertDepressed  Type of Therapy: Individual Therapy  Treatment Goals addressed: Establish therapeutic alliance, learn and implement cognitive and behavioral strategies to overcome depression  Interventions: CBT and Supportive          Summary: Cynthia Cochran is a 71 y.o. female who is referred for services by nurse practitioner due to experiencing symptoms of anxiety and depression.  She denies any psychiatric hospitalizations.  She participated in grief counseling when her husband died about 6 years ago.  Patient reports she does not know how to deal with her family anymore.  Per her report, they are always expecting things from her particularly her nieces and nephews.  She expresses frustration and sadness as they do not follow through with their promises and can be emotionally abusive.  She states they make her feel like she is the Bible in the family.  Patient reports crying spells, excessive worrying, and secluding self.   Patient last was seen 2 weeks ago. She reports increased stress and sadness related to health issues as well has relationship with her family.  Patient reports recently seen in OB/GYN who diagnosed patient with prolapsed uterus.  She reports then having a procedure that she hoped would reduce the pain.  However, this did not happen.  She plans to talk with OB/GYN about other options.  She reports enjoying being with her sister and other family members about 2 weeks ago.  However, she expresses frustration and disappointment they have not invited her to participate in activities or go out to dinner with them since then.  She reports niece initiated contact with her regarding a bill.  Patient was able to remain assertive and set limits.  Patient reports she has tried to stay involved in activities such as attending church, going  out to dinner with a friend.  She also completed a voluntary application and plans to submit today.    Suicidal/Homicidal: Negativewithout intent/plan  Therapist Response: Reviewed symptoms, praised and reinforced patient's efforts to set/maintain limits, completion of volunteer application, discussed stressors, facilitated expression of thoughts and feelings, validated feelings, assisted patient examined her pattern of interaction with her family and the connection between thoughts/mood/behavior discussed the effects of should/ought/must statements on patient's mood and behavior, assisted patient identify realistic expectations regarding interaction with family, encouraged patient to continue efforts to pursue more involvement in her existing positive relationships, also encouraged patient to follow through with plan to volunteer   Plan: Return again in 2 weeks.  Diagnosis: Axis I: Adjustment disorder with mixed anxiety and depressed mood     Alonza Smoker, LCSW 04/21/2021

## 2021-04-22 ENCOUNTER — Telehealth: Payer: Self-pay | Admitting: Adult Health

## 2021-04-22 NOTE — Telephone Encounter (Signed)
Patient would like to discuss the laparoscopic surgery to remove uterus. Please advise.

## 2021-05-03 DIAGNOSIS — M546 Pain in thoracic spine: Secondary | ICD-10-CM | POA: Diagnosis not present

## 2021-05-03 DIAGNOSIS — M9903 Segmental and somatic dysfunction of lumbar region: Secondary | ICD-10-CM | POA: Diagnosis not present

## 2021-05-03 DIAGNOSIS — M5136 Other intervertebral disc degeneration, lumbar region: Secondary | ICD-10-CM | POA: Diagnosis not present

## 2021-05-03 DIAGNOSIS — M9905 Segmental and somatic dysfunction of pelvic region: Secondary | ICD-10-CM | POA: Diagnosis not present

## 2021-05-03 DIAGNOSIS — M9902 Segmental and somatic dysfunction of thoracic region: Secondary | ICD-10-CM | POA: Diagnosis not present

## 2021-05-05 ENCOUNTER — Other Ambulatory Visit: Payer: Self-pay

## 2021-05-05 ENCOUNTER — Ambulatory Visit (INDEPENDENT_AMBULATORY_CARE_PROVIDER_SITE_OTHER): Payer: Medicare Other | Admitting: Psychiatry

## 2021-05-05 DIAGNOSIS — F4323 Adjustment disorder with mixed anxiety and depressed mood: Secondary | ICD-10-CM

## 2021-05-05 NOTE — Progress Notes (Signed)
In - Person  THERAPIST PROGRESS NOTE  Session Time:   Thursday  2/232023 9:010 AM - 9:52 AM   Participation Level: Active  Behavioral Response: CasualAlert/less depressed   Type of Therapy: Individual Therapy  Treatment Goals addressed: Reduce frequency, intensity, and duration of depression symptoms as evidenced by patient engaging in activities (volunteering, socializing) 3 to 4 days/week for 4 consecutive weeks  Progress toward goals:  Progressing  Interventions: CBT and Supportive          Summary: Cynthia Cochran is a 71 y.o. female who is referred for services by nurse practitioner due to experiencing symptoms of anxiety and depression.  She denies any psychiatric hospitalizations.  She participated in grief counseling when her husband died about 6 years ago.  Patient reports she does not know how to deal with her family anymore.  Per her report, they are always expecting things from her particularly her nieces and nephews.  She expresses frustration and sadness as they do not follow through with their promises and can be emotionally abusive.  She states they make her feel like she is the Bible in the family.  Patient reports crying spells, excessive worrying, and secluding self.   Patient last was seen 2 weeks ago. She reports improved mood and increased behavioral activation/socialization.  Patient reports having more realistic expectations of her family.  She reports learning to accept that they probably will not initiate contact with her regarding doing activities together.  However, she realizes she has options and can initiate contact if she desires.  She reports recently initiating contact and having dinner with her sister and her niece on Valentine's Day.  She also has started focusing more on staying busy and nurturing other positive relationships.  Since last session, she has attended church regularly, gone out to dinner with friends and is looking forward to going to a probably  show tonight and to a movie tomorrow night.  She also reports more involvement with her neighbors.  She continues to express concerns about her health issues but is hopeful about an upcoming appointment on February 3 to see her doctor.  Patient also has submitted her volunteer application for the hospital and hopes to receive a positive response soon.   Suicidal/Homicidal: Negativewithout intent/plan  Therapist Response: Reviewed symptoms, praised and reinforced patient's efforts to have more realistic expectations regarding interaction with family, discussed effects, praised and reinforced patient's efforts to pursue volunteer opportunities, praised and reinforced patient's increased behavioral activation and socialization, discussed effects, discussed ways to maintain consistent by behavioral activation and socialization with the use of daily planning, provided instructions on how to use daily planning form, develop plan with patient to complete and bring to next session efforts.  Plan: Return again in 2 weeks.  Diagnosis: Axis I: Adjustment disorder with mixed anxiety and depressed mood  Collaboration of Care: Other    none needed at this visit  Patient/Guardian was advised Release of Information must be obtained prior to any record release in order to collaborate their care with an outside provider. Patient/Guardian was advised if they have not already done so to contact the registration department to sign all necessary forms in order for [place] to release information regarding their care.   Consent: Patient/Guardian gives verbal consent for treatment and assignment of benefits for services provided during this visit. Patient/Guardian expressed understanding and agreed to proceed.    Adah Salvage, LCSW 05/05/2021

## 2021-05-11 DIAGNOSIS — M9905 Segmental and somatic dysfunction of pelvic region: Secondary | ICD-10-CM | POA: Diagnosis not present

## 2021-05-11 DIAGNOSIS — M546 Pain in thoracic spine: Secondary | ICD-10-CM | POA: Diagnosis not present

## 2021-05-11 DIAGNOSIS — M5136 Other intervertebral disc degeneration, lumbar region: Secondary | ICD-10-CM | POA: Diagnosis not present

## 2021-05-11 DIAGNOSIS — M9902 Segmental and somatic dysfunction of thoracic region: Secondary | ICD-10-CM | POA: Diagnosis not present

## 2021-05-11 DIAGNOSIS — M9903 Segmental and somatic dysfunction of lumbar region: Secondary | ICD-10-CM | POA: Diagnosis not present

## 2021-05-13 ENCOUNTER — Other Ambulatory Visit: Payer: Self-pay

## 2021-05-13 ENCOUNTER — Encounter: Payer: Self-pay | Admitting: Obstetrics & Gynecology

## 2021-05-13 ENCOUNTER — Ambulatory Visit (INDEPENDENT_AMBULATORY_CARE_PROVIDER_SITE_OTHER): Payer: Medicare Other | Admitting: Obstetrics & Gynecology

## 2021-05-13 VITALS — BP 169/87 | HR 72 | Ht 62.0 in | Wt 153.0 lb

## 2021-05-13 DIAGNOSIS — N949 Unspecified condition associated with female genital organs and menstrual cycle: Secondary | ICD-10-CM | POA: Diagnosis not present

## 2021-05-13 DIAGNOSIS — N952 Postmenopausal atrophic vaginitis: Secondary | ICD-10-CM

## 2021-05-13 MED ORDER — IMVEXXY STARTER PACK 10 MCG VA INST: 1.0000 | VAGINAL_INSERT | Freq: Every day | VAGINAL | 0 refills | Status: DC

## 2021-05-13 MED ORDER — GABAPENTIN 100 MG PO CAPS: 100.0000 mg | ORAL_CAPSULE | Freq: Three times a day (TID) | ORAL | 1 refills | Status: DC

## 2021-05-13 NOTE — Progress Notes (Signed)
Chief Complaint  Patient presents with   Second opinion      71 y.o. [id number] No LMP recorded. Patient is postmenopausal. The current method of family planning is post menopausal status.  Outpatient Encounter Medications as of 05/13/2021  Medication Sig   acetaminophen (TYLENOL) 650 MG CR tablet Take 1,300 mg by mouth every 8 (eight) hours as needed for pain.   atorvastatin (LIPITOR) 80 MG tablet Take 80 mg by mouth at bedtime.    Cholecalciferol (DIALYVITE VITAMIN D 5000) 125 MCG (5000 UT) capsule Take 5,000 Units by mouth daily.   COLLAGEN PO Take 1 capsule by mouth daily.   ezetimibe (ZETIA) 10 MG tablet Take 10 mg by mouth at bedtime.    HYDROcodone-acetaminophen (NORCO/VICODIN) 5-325 MG tablet Take 1 tablet by mouth 3 (three) times daily as needed for moderate pain.   LORazepam (ATIVAN) 2 MG tablet Take 2 mg by mouth at bedtime.   losartan (COZAAR) 25 MG tablet Take 25 mg by mouth daily.   polyethylene glycol (MIRALAX / GLYCOLAX) 17 g packet Take 17 g by mouth daily. One capful daily in coffee   Psyllium (METAMUCIL PO) Take 1 Dose by mouth daily. 1 dose = 2 teaspoons   Specialty Vitamins Products (BIOTIN PLUS KERATIN PO) Take 1 capsule by mouth daily.   Turmeric Curcumin 500 MG CAPS Take 500 mg by mouth daily.   [DISCONTINUED] amitriptyline (ELAVIL) 50 MG tablet Take 1 tablet (50 mg total) by mouth at bedtime.   [DISCONTINUED] Estradiol Starter Pack (IMVEXXY STARTER PACK) 10 MCG INST Place 1 capsule vaginally at bedtime. For 2 weeks then use 1 twice per week   [DISCONTINUED] gabapentin (NEURONTIN) 100 MG capsule Take 1 capsule (100 mg total) by mouth 3 (three) times daily.   [DISCONTINUED] oxybutynin (DITROPAN) 5 MG tablet Take 5 mg by mouth 3 (three) times daily.   No facility-administered encounter medications on file as of 05/13/2021.    Subjective Pt is being seen for the feeling sensation of a bulge in the vagina Feels like a ball is there and she sits on  it Persistent for 1-2 years She also has mixed incontinence symptoms, +cough sneeze and some urgency, unprovoked loss as well  Past Medical History:  Diagnosis Date   Anxiety    Arthritis    Blood transfusion without reported diagnosis 1982   after c section   Dysphagia 04/25/2016   Gastric ulcer    GERD (gastroesophageal reflux disease)    Heart murmur    High cholesterol 04/25/2016   Hyperlipidemia    Mixed stress and urge urinary incontinence 09/09/2015   Sciatic nerve pain    Spinal stenosis     Past Surgical History:  Procedure Laterality Date   BIOPSY  05/12/2016   Procedure: BIOPSY;  Surgeon: Malissa Hippo, MD;  Location: [facility];  Service: Endoscopy;;  gastric   BIOPSY  10/04/2018   Procedure: BIOPSY;  Surgeon: Malissa Hippo, MD;  Location: [facility];  Service: Endoscopy;;  gastric    CARPAL TUNNEL RELEASE     right; 1998, left 1998   CESAREAN SECTION  1982   COLONOSCOPY WITH PROPOFOL N/A 10/04/2018   Procedure: COLONOSCOPY WITH PROPOFOL;  Surgeon: Malissa Hippo, MD;  Location: [facility];  Service: Endoscopy;  Laterality: N/A;  7:30   Colonscopy     ESOPHAGEAL DILATION N/A 05/12/2016   Procedure: ESOPHAGEAL DILATION;  Surgeon: Malissa Hippo, MD;  Location: [facility];  Service: Endoscopy;  Laterality: N/A;   ESOPHAGOGASTRODUODENOSCOPY N/A 05/12/2016   Procedure: ESOPHAGOGASTRODUODENOSCOPY (EGD);  Surgeon: Malissa HippoNajeeb U Rehman, MD;  Location: [facility];  Service: Endoscopy;  Laterality: N/A;  9:30   ESOPHAGOGASTRODUODENOSCOPY (EGD) WITH PROPOFOL N/A 10/04/2018   Procedure: ESOPHAGOGASTRODUODENOSCOPY (EGD) WITH PROPOFOL;  Surgeon: Malissa Hippoehman, Najeeb U, MD;  Location: [facility];  Service: Endoscopy;  Laterality: N/A;   EYE SURGERY     lasix   FLEXIBLE SIGMOIDOSCOPY N/A 01/28/2021   Procedure: FLEXIBLE SIGMOIDOSCOPY WITH PROPOFOL;  Surgeon: Malissa Hippoehman, Najeeb U, MD;  Location: [facility];  Service: Endoscopy;  Laterality: N/A;  9:30   JOINT REPLACEMENT      both hips   LUMBAR LAMINECTOMY/DECOMPRESSION MICRODISCECTOMY N/A 08/05/2020   Procedure: LAMINECTOMY THORACIC TEN-ELEVEN;  Surgeon: Coletta Memosabbell, Kyle, MD;  Location: [facility];  Service: Neurosurgery;  Laterality: N/A;   LUMBAR LAMINECTOMY/DECOMPRESSION MICRODISCECTOMY N/A 10/14/2020   Procedure: Right Lumbar 4-5, Lumbar 5 Sacral 1 Lumbar and thoracic laminectomy;  Surgeon: Coletta Memosabbell, Kyle, MD;  Location: [facility];  Service: Neurosurgery;  Laterality: N/A;  Right Lumbar 4-5, Lumbar 5 Sacral 1 Lumbar and thoracic laminectomy   TOTAL HIP ARTHROPLASTY     right; March 2013; left March 2011    OB History     Gravida  3   Para  1   Term      Preterm  1   AB  2   Living  0      SAB  2   IAB      Ectopic      Multiple      Live Births              Allergies  Allergen Reactions   Lyrica [Pregabalin] Other (See Comments)    Feet numb   Mobic [Meloxicam] Other (See Comments)    Avoid due to a history of ulcers    Nsaids Other (See Comments)    Avoid due to a history of ulcers     Social History   Socioeconomic History   Marital status: Widowed    Spouse name: Not on file   Number of children: 0   Years of education: some college   Highest education level: Not on file  Occupational History   Occupation: Retired  Tobacco Use   Smoking status: Never   Smokeless tobacco: Never  Vaping Use   Vaping Use: Never used  Substance and Sexual Activity   Alcohol use: Yes    Comment: social drinking - a glass of wine about every 2-3 weeka   Drug use: No   Sexual activity: Not Currently    Birth control/protection: Post-menopausal, Abstinence  Other Topics Concern   Not on file  Social History Narrative   Lives alone   Caffeine use: no more than 2 cups per day    16 oz soda per day   Right handed    Social Determinants of Health   Financial Resource Strain: Low Risk  (04/15/2021)   Overall Financial Resource Strain (CARDIA)    Difficulty of Paying Living Expenses: Not hard  at all  Food Insecurity: No Food Insecurity (04/15/2021)   Hunger Vital Sign    Worried About Running Out of Food in the Last Year: Never true    Ran Out of Food in the Last Year: Never true  Transportation Needs: No Transportation Needs (04/15/2021)   PRAPARE - Administrator, Civil ServiceTransportation    Lack of Transportation (Medical): No    Lack of Transportation (Non-Medical): No  Physical Activity: Insufficiently Active (04/15/2021)   Exercise Vital Sign    Days of Exercise per Week: 3 days    Minutes of Exercise per Session: 20 min  Stress: No Stress Concern Present (04/15/2021)   [employer] of Occupational Health - Occupational Stress Questionnaire    Feeling of Stress : Not at all  Social Connections: Moderately Integrated (04/15/2021)   Social Connection and Isolation Panel [NHANES]    Frequency of Communication with Friends and Family: More than three times a week    Frequency of Social Gatherings with Friends and Family: Once a week    Attends Religious Services: More than 4 times per year    Active Member of [employer] or Organizations: Yes    Attends Banker Meetings: More than 4 times per year    Marital Status: Widowed    Family History  Problem Relation Age of Onset   Colon cancer Father 23   Colon cancer Brother 62   Other Mother        heavy smoker; died from arterial sclerosis   Hypertension Sister    Stomach cancer Neg Hx     Medications:       Current Outpatient Medications:    acetaminophen (TYLENOL) 650 MG CR tablet, Take 1,300 mg by mouth every 8 (eight) hours as needed for pain., Disp: , Rfl:    atorvastatin (LIPITOR) 80 MG tablet, Take 80 mg by mouth at bedtime. , Disp: , Rfl:    Cholecalciferol (DIALYVITE VITAMIN D 5000) 125 MCG (5000 UT) capsule, Take 5,000 Units by mouth daily., Disp: , Rfl:    COLLAGEN PO, Take 1 capsule by mouth daily., Disp: , Rfl:    ezetimibe (ZETIA) 10 MG tablet, Take 10 mg by mouth at bedtime. , Disp: , Rfl:    HYDROcodone-acetaminophen  (NORCO/VICODIN) 5-325 MG tablet, Take 1 tablet by mouth 3 (three) times daily as needed for moderate pain., Disp: , Rfl:    LORazepam (ATIVAN) 2 MG tablet, Take 2 mg by mouth at bedtime., Disp: , Rfl: 2   losartan (COZAAR) 25 MG tablet, Take 25 mg by mouth daily., Disp: , Rfl:    polyethylene glycol (MIRALAX / GLYCOLAX) 17 g packet, Take 17 g by mouth daily. One capful daily in coffee, Disp: , Rfl:    Psyllium (METAMUCIL PO), Take 1 Dose by mouth daily. 1 dose = 2 teaspoons, Disp: , Rfl:    Specialty Vitamins Products (BIOTIN PLUS KERATIN PO), Take 1 capsule by mouth daily., Disp: , Rfl:    Turmeric Curcumin 500 MG CAPS, Take 500 mg by mouth daily., Disp: , Rfl:    amitriptyline (ELAVIL) 50 MG tablet, Take 1 tablet (50 mg total) by mouth at bedtime., Disp: 90 tablet, Rfl: 1   desvenlafaxine (PRISTIQ) 25 MG 24 hr tablet, Take 50 mg by mouth daily., Disp: , Rfl:    fenofibrate 160 MG tablet, , Disp: , Rfl:    gabapentin (NEURONTIN) 300 MG capsule, Take 1 capsule (300 mg total) by mouth 3 (three) times daily., Disp: 90 capsule, Rfl: 2   oxybutynin (DITROPAN) 5 MG tablet, Take by mouth., Disp: , Rfl:    predniSONE (DELTASONE) 50 MG tablet, Take 1 tablet daily with breakfast for 5 days., Disp: 5 tablet, Rfl: 0   tiZANidine (ZANAFLEX) 4 MG tablet, Take 1 tablet (4 mg total) by mouth every 6 (six) hours as needed for muscle spasms., Disp: 20 tablet, Rfl: 0  Objective Blood pressure (!) 169/87, pulse 72, height  5\' 2"  (1.575 m), weight 153 lb (69.4 kg).  General WDWN female NAD Vulva:  normal appearing vulva with no masses, tenderness or lesions Vagina:  normal mucosa, no discharge atrophic, no prolapse is appreciated either  in the apical anterior or posterior compartment Cervix:  Normal no lesions Uterus:  normal size, contour, position, consistency, mobility, non-tender Adnexa: ovaries:present,  normal adnexa in size, nontender and no masses   Pertinent ROS Per HPI No burning with urination,  frequency or urgency No nausea, vomiting or diarrhea Nor fever chills or other constitutional symptoms   Labs or studies na    Impression Diagnoses this Encounter::   ICD-10-CM   1. Symptom associated with female genital organs  N94.9    Patient gives a great history for prolapse she just doesn't have any prolapse of significance on exam.  Suspicious fo rneurogenic pain source/pelvic floor    2. Vaginal atrophy  N95.2    May not be related but will try to improve tissue integrity with imvexxy 10     Trial gabapentin to see if neurogenic source, no anatomical issues are noted.  Has a significant orthopedic and back surgery history which is likely related to her unusual sensation  Plan/Recommendations: Meds ordered this encounter  Medications   DISCONTD: gabapentin (NEURONTIN) 100 MG capsule    Sig: Take 1 capsule (100 mg total) by mouth 3 (three) times daily.    Dispense:  90 capsule    Refill:  1    We are suspicious the gabapentin was not the source of her feet going numb, as she is still having that, will titarte up slowly   DISCONTD: Estradiol Starter Pack (IMVEXXY STARTER PACK) 10 MCG INST    Sig: Place 1 capsule vaginally at bedtime. For 2 weeks then use 1 twice per week    Dispense:  18 each    Refill:  0    Labs or Scans Ordered: No orders of the defined types were placed in this encounter.    All questions were answered.

## 2021-05-19 ENCOUNTER — Ambulatory Visit (INDEPENDENT_AMBULATORY_CARE_PROVIDER_SITE_OTHER): Payer: Medicare Other | Admitting: Psychiatry

## 2021-05-19 ENCOUNTER — Other Ambulatory Visit: Payer: Self-pay

## 2021-05-19 DIAGNOSIS — F4323 Adjustment disorder with mixed anxiety and depressed mood: Secondary | ICD-10-CM | POA: Diagnosis not present

## 2021-05-19 NOTE — Progress Notes (Signed)
In - Person ? ?THERAPIST PROGRESS NOTE ? ?Session Time:   Thursday  05/19/2021 9:10 AM - 10:00 AM  ? ?Participation Level: Active ? ?Behavioral Response: CasualAlert/less depressed  ? ?Type of Therapy: Individual Therapy ? ?Treatment Goals addressed: Reduce frequency, intensity, and duration of depression symptoms as evidenced by patient engaging in activities (volunteering, socializing) 3 to 4 days/week for 4 consecutive weeks ? ?Progress toward goals:  Progressing ? ?Interventions: CBT and Supportive         ? ?Summary: Cynthia Cochran is a 71 y.o. female who is referred for services by nurse practitioner due to experiencing symptoms of anxiety and depression.  She denies any psychiatric hospitalizations.  She participated in grief counseling when her husband died about 6 years ago.  Patient reports she does not know how to deal with her family anymore.  Per her report, they are always expecting things from her particularly her nieces and nephews.  She expresses frustration and sadness as they do not follow through with their promises and can be emotionally abusive.  She states they make her feel like she is the Bible in the family.  Patient reports crying spells, excessive worrying, and secluding self.  ? ?Patient last was seen 2 weeks ago. She reports improved mood and increased behavioral activation/socialization since last session.  Per her report, her sister-in-law stayed overnight with patient for 5 nights.  She reports enjoying this visit very much and being involved in activities.  She reports she did not use daily planning as things were already scheduled.  She expresses continued increased acceptance regarding the interaction with her sister and her niece.  She is pleased that they recently invited her to go out to dinner.  She expresses frustration, disappointment, and sadness her nephew whom she and her husband reared does not spend more time with her or make efforts to visit her at her home.  She reports  feeling unappreciated and used.  Patient still reports stress related to medical issues.  She expresses disappointment and frustration her recent appointment with her doctor ruled out a prolapsed uterus as patient had hoped there was an identifiable reason and solution for her pain.  She now plans to ask her doctor for referral to a neurologist.  She has not received any communication regarding her recent volunteer application but does plan to follow-up on this.   ? ?Suicidal/Homicidal: Negativewithout intent/plan ? ?Therapist Response: Reviewed symptoms, praised and reinforced patient's increased behavioral activation/socialization, discussed effects, discussed stressors, facilitated expression of thoughts and feelings, validated feelings, assisted patient identify her expectations of her nephew, assisted patient identify realistic expectations of nephew, reiterated rationale for using daily planning to promote consistent behavioral activation/socialization, developed plan with patient to use daily planning form and bring to next session, encouraged patient to follow-up on volunteer application  ?Plan: Return again in 2 weeks. ? ?Diagnosis: Axis I: Adjustment disorder with mixed anxiety and depressed mood ? ?Collaboration of Care: Other    none needed at this visit ? ?Patient/Guardian was advised Release of Information must be obtained prior to any record release in order to collaborate their care with an outside provider. Patient/Guardian was advised if they have not already done so to contact the registration department to sign all necessary forms in order for [place] to release information regarding their care.  ? ?Consent: Patient/Guardian gives verbal consent for treatment and assignment of benefits for services provided during this visit. Patient/Guardian expressed understanding and agreed to proceed.  ? ? ?Aja Bolander  E Quinette Hentges, LCSW ?05/19/2021 ? ?

## 2021-05-23 ENCOUNTER — Encounter: Payer: Self-pay | Admitting: Obstetrics & Gynecology

## 2021-05-27 ENCOUNTER — Other Ambulatory Visit: Payer: Self-pay | Admitting: Obstetrics & Gynecology

## 2021-05-27 ENCOUNTER — Telehealth: Payer: Self-pay | Admitting: *Deleted

## 2021-05-27 MED ORDER — ESTRADIOL 0.1 MG/GM VA CREA: TOPICAL_CREAM | VAGINAL | 12 refills | Status: DC

## 2021-05-27 NOTE — Telephone Encounter (Signed)
Pt aware new rx sent in. 

## 2021-05-27 NOTE — Telephone Encounter (Signed)
Imvexxy is going to cost over $500 with PA.  ?

## 2021-05-30 ENCOUNTER — Other Ambulatory Visit (INDEPENDENT_AMBULATORY_CARE_PROVIDER_SITE_OTHER): Payer: Self-pay

## 2021-05-30 DIAGNOSIS — M5416 Radiculopathy, lumbar region: Secondary | ICD-10-CM | POA: Diagnosis not present

## 2021-05-30 DIAGNOSIS — I10 Essential (primary) hypertension: Secondary | ICD-10-CM | POA: Diagnosis not present

## 2021-05-30 DIAGNOSIS — M25812 Other specified joint disorders, left shoulder: Secondary | ICD-10-CM | POA: Diagnosis not present

## 2021-05-30 DIAGNOSIS — R109 Unspecified abdominal pain: Secondary | ICD-10-CM

## 2021-05-30 DIAGNOSIS — M19012 Primary osteoarthritis, left shoulder: Secondary | ICD-10-CM | POA: Diagnosis not present

## 2021-05-30 DIAGNOSIS — K279 Peptic ulcer, site unspecified, unspecified as acute or chronic, without hemorrhage or perforation: Secondary | ICD-10-CM

## 2021-05-30 DIAGNOSIS — M4714 Other spondylosis with myelopathy, thoracic region: Secondary | ICD-10-CM | POA: Diagnosis not present

## 2021-05-30 DIAGNOSIS — Z6828 Body mass index (BMI) 28.0-28.9, adult: Secondary | ICD-10-CM | POA: Diagnosis not present

## 2021-05-30 MED ORDER — AMITRIPTYLINE HCL 50 MG PO TABS: 50.0000 mg | ORAL_TABLET | Freq: Every day | ORAL | 1 refills | Status: DC

## 2021-06-01 DIAGNOSIS — M25512 Pain in left shoulder: Secondary | ICD-10-CM | POA: Diagnosis not present

## 2021-06-02 ENCOUNTER — Ambulatory Visit (HOSPITAL_COMMUNITY): Payer: Medicare Other | Admitting: Psychiatry

## 2021-06-09 DIAGNOSIS — M79606 Pain in leg, unspecified: Secondary | ICD-10-CM | POA: Diagnosis not present

## 2021-06-09 DIAGNOSIS — R200 Anesthesia of skin: Secondary | ICD-10-CM | POA: Diagnosis not present

## 2021-06-13 ENCOUNTER — Encounter: Payer: Self-pay | Admitting: Obstetrics & Gynecology

## 2021-06-13 ENCOUNTER — Ambulatory Visit (INDEPENDENT_AMBULATORY_CARE_PROVIDER_SITE_OTHER): Payer: Medicare Other | Admitting: Obstetrics & Gynecology

## 2021-06-13 VITALS — BP 129/76 | HR 71 | Ht 62.0 in | Wt 155.0 lb

## 2021-06-13 DIAGNOSIS — N949 Unspecified condition associated with female genital organs and menstrual cycle: Secondary | ICD-10-CM

## 2021-06-13 DIAGNOSIS — N8110 Cystocele, unspecified: Secondary | ICD-10-CM | POA: Diagnosis not present

## 2021-06-13 DIAGNOSIS — N952 Postmenopausal atrophic vaginitis: Secondary | ICD-10-CM | POA: Diagnosis not present

## 2021-06-13 MED ORDER — GABAPENTIN 100 MG PO CAPS: 200.0000 mg | ORAL_CAPSULE | Freq: Three times a day (TID) | ORAL | 2 refills | Status: DC

## 2021-06-13 MED ORDER — ESTRADIOL 0.1 MG/GM VA CREA: TOPICAL_CREAM | VAGINAL | 12 refills | Status: DC

## 2021-06-13 NOTE — Progress Notes (Signed)
Chief Complaint  Patient presents with   Follow-up      71 y.o. [id number] No LMP recorded. Patient is postmenopausal. The current method of family planning is post menopausal status.  Outpatient Encounter Medications as of 06/13/2021  Medication Sig   acetaminophen (TYLENOL) 650 MG CR tablet Take 1,300 mg by mouth every 8 (eight) hours as needed for pain.   amitriptyline (ELAVIL) 50 MG tablet Take 1 tablet (50 mg total) by mouth at bedtime.   atorvastatin (LIPITOR) 80 MG tablet Take 80 mg by mouth at bedtime.    Cholecalciferol (DIALYVITE VITAMIN D 5000) 125 MCG (5000 UT) capsule Take 5,000 Units by mouth daily.   COLLAGEN PO Take 1 capsule by mouth daily.   ezetimibe (ZETIA) 10 MG tablet Take 10 mg by mouth at bedtime.    HYDROcodone-acetaminophen (NORCO/VICODIN) 5-325 MG tablet Take 1 tablet by mouth 3 (three) times daily as needed for moderate pain.   LORazepam (ATIVAN) 2 MG tablet Take 2 mg by mouth at bedtime.   losartan (COZAAR) 25 MG tablet Take 25 mg by mouth daily.   polyethylene glycol (MIRALAX / GLYCOLAX) 17 g packet Take 17 g by mouth daily. One capful daily in coffee   Psyllium (METAMUCIL PO) Take 1 Dose by mouth daily. 1 dose = 2 teaspoons   Specialty Vitamins Products (BIOTIN PLUS KERATIN PO) Take 1 capsule by mouth daily.   Turmeric Curcumin 500 MG CAPS Take 500 mg by mouth daily.   [DISCONTINUED] estradiol (ESTRACE) 0.1 MG/GM vaginal cream Use 1 grams every other night   [DISCONTINUED] gabapentin (NEURONTIN) 100 MG capsule Take 1 capsule (100 mg total) by mouth 3 (three) times daily.   [DISCONTINUED] oxybutynin (DITROPAN) 5 MG tablet Take 5 mg by mouth 3 (three) times daily.   [DISCONTINUED] estradiol (ESTRACE) 0.1 MG/GM vaginal cream Use 1 grams every other night   [DISCONTINUED] gabapentin (NEURONTIN) 100 MG capsule Take 2 capsules (200 mg total) by mouth 3 (three) times daily.   No facility-administered encounter medications on file as of 06/13/2021.     Subjective Follow up Initiated gabapentin and vaginal estrogen for vaginal/rectal pressure unassociated with any discernable anatomical defect Pt relates maybe some improvement in the pressure sensation but it does continue   Past Medical History:  Diagnosis Date   Anxiety    Arthritis    Blood transfusion without reported diagnosis 1982   after c section   Dysphagia 04/25/2016   Gastric ulcer    GERD (gastroesophageal reflux disease)    Heart murmur    High cholesterol 04/25/2016   Hyperlipidemia    Mixed stress and urge urinary incontinence 09/09/2015   Sciatic nerve pain    Spinal stenosis     Past Surgical History:  Procedure Laterality Date   BIOPSY  05/12/2016   Procedure: BIOPSY;  Surgeon: Rogene Houston, MD;  [facility];  Service: Endoscopy;;  gastric   BIOPSY  10/04/2018   Procedure: BIOPSY;  Surgeon: Rogene Houston, MD;  [facility];  Service: Endoscopy;;  gastric    CARPAL TUNNEL RELEASE     right; 1998, left [place]   COLONOSCOPY WITH PROPOFOL N/A 10/04/2018   Procedure: COLONOSCOPY WITH PROPOFOL;  Surgeon: Rogene Houston, MD;  [facility]: [facility];  Service: Endoscopy;  Laterality: N/A;  7:30   Colonscopy     ESOPHAGEAL DILATION N/A 05/12/2016   Procedure: ESOPHAGEAL DILATION;  Surgeon: Rogene Houston,  MD;  [facility];  Service: Endoscopy;  Laterality: N/A;   ESOPHAGOGASTRODUODENOSCOPY N/A 05/12/2016   Procedure: ESOPHAGOGASTRODUODENOSCOPY (EGD);  Surgeon: Rogene Houston, MD;  [facility];  Service: Endoscopy;  Laterality: N/A;  9:30   ESOPHAGOGASTRODUODENOSCOPY (EGD) WITH PROPOFOL N/A 10/04/2018   Procedure: ESOPHAGOGASTRODUODENOSCOPY (EGD) WITH PROPOFOL;  Surgeon: Rogene Houston, MD;  [facility];  Service: Endoscopy;  Laterality: N/A;   EYE SURGERY     lasix   FLEXIBLE SIGMOIDOSCOPY N/A 01/28/2021   Procedure: FLEXIBLE SIGMOIDOSCOPY WITH PROPOFOL;  Surgeon: Rogene Houston, MD;  [facility];  Service: Endoscopy;  Laterality: N/A;  9:30   JOINT REPLACEMENT     both hips   LUMBAR LAMINECTOMY/DECOMPRESSION MICRODISCECTOMY N/A 08/05/2020   Procedure: LAMINECTOMY THORACIC TEN-ELEVEN;  Surgeon: Ashok Pall, MD;  Location: [place];  Service: Neurosurgery;  Laterality: N/A;   LUMBAR LAMINECTOMY/DECOMPRESSION MICRODISCECTOMY N/A 10/14/2020   Procedure: Right Lumbar 4-5, Lumbar 5 Sacral 1 Lumbar and thoracic laminectomy;  Surgeon: Ashok Pall, MD;  Location: [place];  Service: Neurosurgery;  Laterality: N/A;  Right Lumbar 4-5, Lumbar 5 Sacral 1 Lumbar and thoracic laminectomy   TOTAL HIP ARTHROPLASTY     right; March 2013; left March 2011    OB History     Gravida  3   Para  1   Term      Preterm  1   AB  2   Living  0      SAB  2   IAB      Ectopic      Multiple      Live Births              Allergies  Allergen Reactions   Lyrica [Pregabalin] Other (See Comments)    Feet numb   Mobic [Meloxicam] Other (See Comments)    Avoid due to a history of ulcers    Nsaids Other (See Comments)    Avoid due to a history of ulcers     Social History   Socioeconomic History   Marital status: Widowed    Spouse name: Not on file   Number of children: 0   Years of education: some college   Highest education level: Not on file  Occupational History   Occupation: Retired  Tobacco Use   Smoking status: Never   Smokeless tobacco: Never  Vaping Use   Vaping Use: Never used  Substance and Sexual Activity   Alcohol use: Yes    Comment: social drinking - a glass of wine about every 2-3 weeka   Drug use: No   Sexual activity: Not Currently    Birth control/protection: Post-menopausal, Abstinence  Other Topics Concern   Not on file  Social History Narrative   Lives alone   Caffeine use: no more than 2 cups per day    16 oz soda per day   Right handed    Social Determinants of Health   Financial Resource Strain: Low Risk   (04/15/2021)   Overall Financial Resource Strain (CARDIA)    Difficulty of Paying Living Expenses: Not hard at all  Food Insecurity: No Food Insecurity (04/15/2021)   Hunger Vital Sign    Worried About Running Out of Food in the Last Year: Never true    [place] in the Last Year: Never true  Transportation Needs: No Transportation Needs (04/15/2021)   PRAPARE - Hydrologist (Medical): No  Lack of Transportation (Non-Medical): No  Physical Activity: Insufficiently Active (04/15/2021)   Exercise Vital Sign    Days of Exercise per Week: 3 days    Minutes of Exercise per Session: 20 min  Stress: No Stress Concern Present (04/15/2021)   [employer] of Occupational Health - Occupational Stress Questionnaire    Feeling of Stress : Not at all  Social Connections: Moderately Integrated (04/15/2021)   Social Connection and Isolation Panel [NHANES]    Frequency of Communication with Friends and Family: More than three times a week    Frequency of Social Gatherings with Friends and Family: Once a week    Attends Religious Services: More than 4 times per year    Active Member of [employer] or Organizations: Yes    Attends Banker Meetings: More than 4 times per year    Marital Status: Widowed    Family History  Problem Relation Age of Onset   Colon cancer Father 58   Colon cancer Brother 60   Other Mother        heavy smoker; died from arterial sclerosis   Hypertension Sister    Stomach cancer Neg Hx     Medications:       Current Outpatient Medications:    acetaminophen (TYLENOL) 650 MG CR tablet, Take 1,300 mg by mouth every 8 (eight) hours as needed for pain., Disp: , Rfl:    amitriptyline (ELAVIL) 50 MG tablet, Take 1 tablet (50 mg total) by mouth at bedtime., Disp: 90 tablet, Rfl: 1   atorvastatin (LIPITOR) 80 MG tablet, Take 80 mg by mouth at bedtime. , Disp: , Rfl:    Cholecalciferol (DIALYVITE VITAMIN D 5000) 125 MCG (5000 UT) capsule, Take  5,000 Units by mouth daily., Disp: , Rfl:    COLLAGEN PO, Take 1 capsule by mouth daily., Disp: , Rfl:    ezetimibe (ZETIA) 10 MG tablet, Take 10 mg by mouth at bedtime. , Disp: , Rfl:    HYDROcodone-acetaminophen (NORCO/VICODIN) 5-325 MG tablet, Take 1 tablet by mouth 3 (three) times daily as needed for moderate pain., Disp: , Rfl:    LORazepam (ATIVAN) 2 MG tablet, Take 2 mg by mouth at bedtime., Disp: , Rfl: 2   losartan (COZAAR) 25 MG tablet, Take 25 mg by mouth daily., Disp: , Rfl:    polyethylene glycol (MIRALAX / GLYCOLAX) 17 g packet, Take 17 g by mouth daily. One capful daily in coffee, Disp: , Rfl:    Psyllium (METAMUCIL PO), Take 1 Dose by mouth daily. 1 dose = 2 teaspoons, Disp: , Rfl:    Specialty Vitamins Products (BIOTIN PLUS KERATIN PO), Take 1 capsule by mouth daily., Disp: , Rfl:    Turmeric Curcumin 500 MG CAPS, Take 500 mg by mouth daily., Disp: , Rfl:    desvenlafaxine (PRISTIQ) 25 MG 24 hr tablet, Take 50 mg by mouth daily., Disp: , Rfl:    fenofibrate 160 MG tablet, , Disp: , Rfl:    gabapentin (NEURONTIN) 300 MG capsule, Take 1 capsule (300 mg total) by mouth 3 (three) times daily., Disp: 90 capsule, Rfl: 2   oxybutynin (DITROPAN) 5 MG tablet, Take by mouth., Disp: , Rfl:    predniSONE (DELTASONE) 50 MG tablet, Take 1 tablet daily with breakfast for 5 days., Disp: 5 tablet, Rfl: 0   tiZANidine (ZANAFLEX) 4 MG tablet, Take 1 tablet (4 mg total) by mouth every 6 (six) hours as needed for muscle spasms., Disp: 20 tablet, Rfl: 0  Objective Blood  pressure 129/76, pulse 71, height 5\' 2"  (1.575 m), weight 155 lb (70.3 kg).  General WDWN female NAD Vulva:  normal appearing vulva with no masses, tenderness or lesions Vagina:  normal mucosa, no discharge atrophic still after vag tabs Cervix:  Normal no lesions Uterus:  normal size, contour, position, consistency, mobility, non-tender Adnexa: ovaries:present,  normal adnexa in size, nontender and no masses   Pertinent ROS No  burning with urination, frequency or urgency No nausea, vomiting or diarrhea Nor fever chills or other constitutional symptoms   Labs or studies na    Impression + Management Plan: Diagnoses this Encounter::   ICD-10-CM   1. Symptom associated with female genital organs  N94.9     2. Vaginal atrophy  N95.2     3. Pelvic organ prolapse quantification stage 1 cystocele  N81.10         Medications prescribed during  this encounter: Meds ordered this encounter  Medications   DISCONTD: gabapentin (NEURONTIN) 100 MG capsule    Sig: Take 2 capsules (200 mg total) by mouth 3 (three) times daily.    Dispense:  180 capsule    Refill:  2    We are suspicious the gabapentin was not the source of her feet going numb, as she is still having that, will titarte up slowly   DISCONTD: estradiol (ESTRACE) 0.1 MG/GM vaginal cream    Sig: Use 1 grams every other night    Dispense:  30 g    Refill:  12    Labs or Scans Ordered during this encounter: No orders of the defined types were placed in this encounter.     Follow up Return in about 3 months (around 09/12/2021) for Follow up, with Dr Elonda Husky.

## 2021-06-14 DIAGNOSIS — M25512 Pain in left shoulder: Secondary | ICD-10-CM | POA: Diagnosis not present

## 2021-06-16 ENCOUNTER — Ambulatory Visit (HOSPITAL_COMMUNITY): Payer: Medicare Other | Admitting: Psychiatry

## 2021-06-16 DIAGNOSIS — M4714 Other spondylosis with myelopathy, thoracic region: Secondary | ICD-10-CM | POA: Diagnosis not present

## 2021-06-16 DIAGNOSIS — R200 Anesthesia of skin: Secondary | ICD-10-CM | POA: Diagnosis not present

## 2021-06-21 ENCOUNTER — Encounter (INDEPENDENT_AMBULATORY_CARE_PROVIDER_SITE_OTHER): Payer: Self-pay | Admitting: Internal Medicine

## 2021-06-21 ENCOUNTER — Ambulatory Visit (INDEPENDENT_AMBULATORY_CARE_PROVIDER_SITE_OTHER): Payer: Medicare Other | Admitting: Internal Medicine

## 2021-06-21 VITALS — BP 160/74 | HR 80 | Temp 98.0°F | Ht 62.0 in | Wt 152.9 lb

## 2021-06-21 DIAGNOSIS — K6289 Other specified diseases of anus and rectum: Secondary | ICD-10-CM

## 2021-06-21 NOTE — Patient Instructions (Signed)
Physician will call with results of MRI when completed 

## 2021-06-21 NOTE — Progress Notes (Signed)
Presenting complaint; ? ?Follow-up for rectal discomfort. ? ?Database and subjective: ? ?Patient is 71 year old Caucasian female who is here for scheduled visit.  She was last seen on 04/05/2021. ?Patient has a history of IBS/constipation responding therapy. ?On her visit in October 2022 she was felt to have anal fissure.  However she did not respond to therapy.  She underwent flexible sigmoidoscopy on 01/28/2021 and no abnormality was noted to account for symptoms.  She describe constant rectal discomfort and she felt as if she was sitting on a tennis ball all the time.  She was referred to [facility] for anorectal manometry which she underwent on 02/21/2021.  She developed rectal discomfort with balloon distention to 200 mL suggesting rectal hypersensitivity.  She did not have any findings suggestive of pelvic floor dyssynergia.  Patient was started on low-dose amitriptyline and it has been gradually increased to 50 mg daily.  If she did not have any side effects with 50 mg she could go up to 75 mg daily. ?Patient does not feel any better.  She notices that she is able to sleep better with amitriptyline.  She is not having any side effects. ?She was seen by Dr. Elonda Husky last week and felt not to have rectocele or any other abnormality to account for her symptoms.  He suggested trying gabapentin which she has been on for 1 week and cannot tell any difference. ?She is not constipated anymore.  She has bowel movement virtually every day.  At times evacuation is not complete.  She also passes stool with urination but she denies melena or rectal bleeding.  Her appetite is good.  She has lost lost any weight.  She had nerve conduction studies by Dr. Christella Noa involving lower extremities as well as perianal region and no abnormality was noted.  She remains with feeling of tiredness and heavy legs when she walks.  Therefore she does not walk for long.  She has not had any falling episodes. ?She takes pain medication no more than once  a week. ?She says rectal discomfort is constant when she is sitting or standing and she does get relief if she lies flat for more than 30 minutes. ? ?Current Medications: ?Outpatient Encounter Medications as of 06/21/2021  ?Medication Sig  ? acetaminophen (TYLENOL) 650 MG CR tablet Take 1,300 mg by mouth every 8 (eight) hours as needed for pain.  ? amitriptyline (ELAVIL) 50 MG tablet Take 1 tablet (50 mg total) by mouth at bedtime.  ? atorvastatin (LIPITOR) 80 MG tablet Take 80 mg by mouth at bedtime.   ? Cholecalciferol (DIALYVITE VITAMIN D 5000) 125 MCG (5000 UT) capsule Take 5,000 Units by mouth daily.  ? COLLAGEN PO Take 1 capsule by mouth daily.  ? estradiol (ESTRACE) 0.1 MG/GM vaginal cream Use 1 grams every other night  ? ezetimibe (ZETIA) 10 MG tablet Take 10 mg by mouth at bedtime.   ? gabapentin (NEURONTIN) 100 MG capsule Take 2 capsules (200 mg total) by mouth 3 (three) times daily.  ? HYDROcodone-acetaminophen (NORCO/VICODIN) 5-325 MG tablet Take 1 tablet by mouth 3 (three) times daily as needed for moderate pain.  ? LORazepam (ATIVAN) 2 MG tablet Take 2 mg by mouth at bedtime.  ? losartan (COZAAR) 25 MG tablet Take 25 mg by mouth daily.  ? polyethylene glycol (MIRALAX / GLYCOLAX) 17 g packet Take 17 g by mouth daily. One capful daily in coffee  ? Psyllium (METAMUCIL PO) Take 1 Dose by mouth daily. 1 dose = 2 teaspoons  ?  Specialty Vitamins Products (BIOTIN PLUS KERATIN PO) Take 1 capsule by mouth daily.  ? Turmeric Curcumin 500 MG CAPS Take 500 mg by mouth daily.  ? [DISCONTINUED] oxybutynin (DITROPAN) 5 MG tablet Take 5 mg by mouth 3 (three) times daily.  ? ?No facility-administered encounter medications on file as of 06/21/2021.  ? ? ? ?Objective: ?Blood pressure (!) 160/74, pulse 80, temperature 98 ?F (36.7 ?C), temperature source Oral, height '5\' 2"'  (1.575 m), weight 152 lb 14.4 oz (69.4 kg). ?Patient is alert and in no acute distress. ?Conjunctiva is pink. Sclera is nonicteric ?Oropharyngeal mucosa is  normal. ?No neck masses or thyromegaly noted. ?Cardiac exam with regular rhythm normal S1 and S2. No murmur or gallop noted. ?Lungs are clear to auscultation. ?Abdomen is symmetrical soft and nontender with organomegaly or masses. ?No LE edema or clubbing noted. ? ?Labs/studies Results: ? ? ? ?  Latest Ref Rng & Units 10/14/2020  ?  1:02 PM 08/04/2020  ? 11:11 AM 05/10/2018  ?  7:38 AM  ?CBC  ?WBC 4.0 - 10.5 K/uL 4.8   5.4   4.4    ?Hemoglobin 12.0 - 15.0 g/dL 13.2   14.0   12.6    ?Hematocrit 36.0 - 46.0 % 40.3   42.1   38.4    ?Platelets 150 - 400 K/uL 167   236   211    ?  ? ?  Latest Ref Rng & Units 10/14/2020  ?  1:02 PM 08/04/2020  ? 11:11 AM 07/22/2019  ? 10:44 AM  ?CMP  ?Glucose 70 - 99 mg/dL 100   98     ?BUN 8 - 23 mg/dL 15   9     ?Creatinine 0.44 - 1.00 mg/dL 0.61   0.65   0.80    ?Sodium 135 - 145 mmol/L 138   136     ?Potassium 3.5 - 5.1 mmol/L 3.6   3.7     ?Chloride 98 - 111 mmol/L 106   106     ?CO2 22 - 32 mmol/L 22   25     ?Calcium 8.9 - 10.3 mg/dL 8.8   8.7     ?  ? ?  Latest Ref Rng & Units 04/08/2018  ?  4:15 AM 04/07/2018  ?  6:45 AM  ?Hepatic Function  ?Total Protein 6.5 - 8.1 g/dL 6.5   7.7    ?Albumin 3.5 - 5.0 g/dL 3.5   4.3    ?AST 15 - 41 U/L 17   20    ?ALT 0 - 44 U/L 14   15    ?Alk Phosphatase 38 - 126 U/L 27   31    ?Total Bilirubin 0.3 - 1.2 mg/dL 1.0   1.2    ?  ?Lumbar spine MRI from 1 year ago did not reveal any abnormalities to sacral region. ? ?Assessment: ? ?#1.  Rectal discomfort.  She has sensation of sitting on a tennis ball.  She was treated for anal fissure last year with this symptom has not changed.  Anorectal manometry revealed rectal hypersensitivity with once again amitriptyline has not provided any relief.  Now she is on low-dose gabapentin without symptomatic relief.  I suspect her symptoms due to nerve irritation or injury secondary to back surgery that she had last year.  Cauda equina tumor remains a possibility but less likely given prior work-up. ? ?#4.   IBS/constipation.  She is not passing hard stools but still not having good evacuation.  She should increase fiber supplement dose. ? ? ?Plan: ? ?MR sacrum without contrast. ?Increase Metamucil to 1 heaping tablespoon full daily by mouth at bedtime. ?Continue polyethylene glycol as before. ?Continue amitriptyline at current dose for the time being. ?Further recommendations to follow. ?Office visit in 6 months. ? ? ? ? ? ?

## 2021-07-07 ENCOUNTER — Ambulatory Visit (INDEPENDENT_AMBULATORY_CARE_PROVIDER_SITE_OTHER): Payer: Medicare Other | Admitting: Internal Medicine

## 2021-07-07 ENCOUNTER — Ambulatory Visit (HOSPITAL_COMMUNITY)
Admission: RE | Admit: 2021-07-07 | Discharge: 2021-07-07 | Disposition: A | Discharge status: Home / Self Care | Payer: Medicare Other | Source: Ambulatory Visit | Attending: Internal Medicine | Admitting: Internal Medicine

## 2021-07-07 DIAGNOSIS — M5136 Other intervertebral disc degeneration, lumbar region: Secondary | ICD-10-CM | POA: Diagnosis not present

## 2021-07-07 DIAGNOSIS — K6289 Other specified diseases of anus and rectum: Secondary | ICD-10-CM | POA: Diagnosis not present

## 2021-07-07 DIAGNOSIS — M533 Sacrococcygeal disorders, not elsewhere classified: Secondary | ICD-10-CM | POA: Diagnosis not present

## 2021-07-07 DIAGNOSIS — M4186 Other forms of scoliosis, lumbar region: Secondary | ICD-10-CM | POA: Diagnosis not present

## 2021-07-11 DIAGNOSIS — N952 Postmenopausal atrophic vaginitis: Secondary | ICD-10-CM | POA: Insufficient documentation

## 2021-07-11 DIAGNOSIS — N8110 Cystocele, unspecified: Secondary | ICD-10-CM | POA: Insufficient documentation

## 2021-07-11 DIAGNOSIS — Z6379 Other stressful life events affecting family and household: Secondary | ICD-10-CM | POA: Diagnosis not present

## 2021-07-11 DIAGNOSIS — K6289 Other specified diseases of anus and rectum: Secondary | ICD-10-CM | POA: Diagnosis not present

## 2021-07-11 DIAGNOSIS — R202 Paresthesia of skin: Secondary | ICD-10-CM | POA: Diagnosis not present

## 2021-07-11 DIAGNOSIS — F321 Major depressive disorder, single episode, moderate: Secondary | ICD-10-CM | POA: Diagnosis not present

## 2021-07-11 DIAGNOSIS — D692 Other nonthrombocytopenic purpura: Secondary | ICD-10-CM | POA: Diagnosis not present

## 2021-07-13 ENCOUNTER — Encounter: Payer: Self-pay | Admitting: Obstetrics & Gynecology

## 2021-07-14 ENCOUNTER — Telehealth (INDEPENDENT_AMBULATORY_CARE_PROVIDER_SITE_OTHER): Payer: Self-pay | Admitting: *Deleted

## 2021-07-14 MED ORDER — GABAPENTIN 300 MG PO CAPS: 300.0000 mg | ORAL_CAPSULE | Freq: Three times a day (TID) | ORAL | 2 refills | Status: DC

## 2021-07-14 NOTE — Telephone Encounter (Signed)
Patient called to check to see what next step is. Had MRI last week.  ? ?[phone] ?

## 2021-07-15 NOTE — Telephone Encounter (Signed)
Dr. Karilyn Cota is aware patient called to check on MRI and he spoke with radiologist and told me he would call the patient.  ?

## 2021-07-18 ENCOUNTER — Telehealth (INDEPENDENT_AMBULATORY_CARE_PROVIDER_SITE_OTHER): Payer: Self-pay | Admitting: *Deleted

## 2021-07-18 NOTE — Telephone Encounter (Signed)
Referral and notes faxed to [facility], they will contact patient with apt ?

## 2021-07-18 NOTE — Telephone Encounter (Signed)
Patient left voicemail  that Dr. Karilyn Cota wanted her to find out the last neurosurgeon she saw and she states she called office but would not let her make her own appt without referral. Dr. Karilyn Cota did want to do referral for her.  ?[facility] Phone number [phone] ?

## 2021-07-20 DIAGNOSIS — M9905 Segmental and somatic dysfunction of pelvic region: Secondary | ICD-10-CM | POA: Diagnosis not present

## 2021-07-20 DIAGNOSIS — M546 Pain in thoracic spine: Secondary | ICD-10-CM | POA: Diagnosis not present

## 2021-07-20 DIAGNOSIS — M9903 Segmental and somatic dysfunction of lumbar region: Secondary | ICD-10-CM | POA: Diagnosis not present

## 2021-07-20 DIAGNOSIS — M9902 Segmental and somatic dysfunction of thoracic region: Secondary | ICD-10-CM | POA: Diagnosis not present

## 2021-07-20 DIAGNOSIS — M5136 Other intervertebral disc degeneration, lumbar region: Secondary | ICD-10-CM | POA: Diagnosis not present

## 2021-07-21 DIAGNOSIS — T148XXA Other injury of unspecified body region, initial encounter: Secondary | ICD-10-CM | POA: Diagnosis not present

## 2021-07-26 NOTE — Telephone Encounter (Signed)
error 

## 2021-07-27 ENCOUNTER — Encounter (INDEPENDENT_AMBULATORY_CARE_PROVIDER_SITE_OTHER): Payer: Self-pay | Admitting: Internal Medicine

## 2021-07-27 ENCOUNTER — Encounter: Payer: Self-pay | Admitting: Obstetrics & Gynecology

## 2021-07-27 DIAGNOSIS — M5136 Other intervertebral disc degeneration, lumbar region: Secondary | ICD-10-CM | POA: Diagnosis not present

## 2021-07-27 DIAGNOSIS — M9903 Segmental and somatic dysfunction of lumbar region: Secondary | ICD-10-CM | POA: Diagnosis not present

## 2021-07-27 DIAGNOSIS — M546 Pain in thoracic spine: Secondary | ICD-10-CM | POA: Diagnosis not present

## 2021-07-27 DIAGNOSIS — M9902 Segmental and somatic dysfunction of thoracic region: Secondary | ICD-10-CM | POA: Diagnosis not present

## 2021-07-27 DIAGNOSIS — M9905 Segmental and somatic dysfunction of pelvic region: Secondary | ICD-10-CM | POA: Diagnosis not present

## 2021-07-27 NOTE — Telephone Encounter (Signed)
Cynthia Cochran, Dr. Laural Golden will be in the office tomorrow we will discuss with him and let you know something. ?

## 2021-07-28 ENCOUNTER — Encounter: Payer: Self-pay | Admitting: Obstetrics & Gynecology

## 2021-07-28 ENCOUNTER — Encounter (INDEPENDENT_AMBULATORY_CARE_PROVIDER_SITE_OTHER): Payer: Self-pay | Admitting: Internal Medicine

## 2021-07-28 NOTE — Telephone Encounter (Signed)
Spoke with patient she is aware that Dr. Laural Golden was called away today to [place] for emergency and would not be in the office. She is aware he should be here tomorrow 07/29/2021 and we would speak with him at that time regarding her and let her know something.

## 2021-07-28 NOTE — Telephone Encounter (Signed)
See other my chart message in epic from today.

## 2021-08-03 DIAGNOSIS — M546 Pain in thoracic spine: Secondary | ICD-10-CM | POA: Diagnosis not present

## 2021-08-03 DIAGNOSIS — M9902 Segmental and somatic dysfunction of thoracic region: Secondary | ICD-10-CM | POA: Diagnosis not present

## 2021-08-03 DIAGNOSIS — M9903 Segmental and somatic dysfunction of lumbar region: Secondary | ICD-10-CM | POA: Diagnosis not present

## 2021-08-03 DIAGNOSIS — M5136 Other intervertebral disc degeneration, lumbar region: Secondary | ICD-10-CM | POA: Diagnosis not present

## 2021-08-03 DIAGNOSIS — M9905 Segmental and somatic dysfunction of pelvic region: Secondary | ICD-10-CM | POA: Diagnosis not present

## 2021-08-10 DIAGNOSIS — M546 Pain in thoracic spine: Secondary | ICD-10-CM | POA: Diagnosis not present

## 2021-08-10 DIAGNOSIS — M9902 Segmental and somatic dysfunction of thoracic region: Secondary | ICD-10-CM | POA: Diagnosis not present

## 2021-08-10 DIAGNOSIS — M9905 Segmental and somatic dysfunction of pelvic region: Secondary | ICD-10-CM | POA: Diagnosis not present

## 2021-08-10 DIAGNOSIS — M5136 Other intervertebral disc degeneration, lumbar region: Secondary | ICD-10-CM | POA: Diagnosis not present

## 2021-08-10 DIAGNOSIS — M9903 Segmental and somatic dysfunction of lumbar region: Secondary | ICD-10-CM | POA: Diagnosis not present

## 2021-08-12 DIAGNOSIS — R102 Pelvic and perineal pain: Secondary | ICD-10-CM | POA: Diagnosis not present

## 2021-08-12 DIAGNOSIS — N9489 Other specified conditions associated with female genital organs and menstrual cycle: Secondary | ICD-10-CM | POA: Diagnosis not present

## 2021-08-12 DIAGNOSIS — N393 Stress incontinence (female) (male): Secondary | ICD-10-CM | POA: Diagnosis not present

## 2021-08-16 ENCOUNTER — Ambulatory Visit
Admission: EM | Admit: 2021-08-16 | Discharge: 2021-08-16 | Disposition: A | Discharge status: Home / Self Care | Payer: Medicare Other | Attending: Nurse Practitioner | Admitting: Nurse Practitioner

## 2021-08-16 DIAGNOSIS — M5442 Lumbago with sciatica, left side: Secondary | ICD-10-CM | POA: Diagnosis not present

## 2021-08-16 DIAGNOSIS — M5441 Lumbago with sciatica, right side: Secondary | ICD-10-CM

## 2021-08-16 MED ORDER — TIZANIDINE HCL 4 MG PO TABS: 4.0000 mg | ORAL_TABLET | Freq: Three times a day (TID) | ORAL | 0 refills | Status: DC | PRN

## 2021-08-16 MED ORDER — DEXAMETHASONE SODIUM PHOSPHATE 10 MG/ML IJ SOLN
10.0000 mg | Freq: Once | INTRAMUSCULAR | Status: AC
  Administered 2021-08-16: 10 mg via INTRAMUSCULAR

## 2021-08-16 MED ORDER — PREDNISONE 50 MG PO TABS: 50.0000 mg | ORAL_TABLET | Freq: Every day | ORAL | 0 refills | Status: AC

## 2021-08-16 NOTE — ED Triage Notes (Signed)
Pt states she has sciatic problems and it is progressively getting worse  Pt states that she is having pain in her right side  Pt states she has tried Tylenol without much relief

## 2021-08-16 NOTE — Discharge Instructions (Addendum)
-   We have given you an injection of Decadron today to help with pain and inflammation in your back - Please start the prednisone tomorrow morning - You can continue to use Tylenol, tizanidine 4 mg, and ice/heat/stretching to help with the pain - Please seek care if your symptoms persist or worsen despite treatment

## 2021-08-16 NOTE — ED Provider Notes (Signed)
[facility]    CSN: [id number] Arrival date & time: 08/16/21  1246      History   Chief Complaint Chief Complaint  Patient presents with   siatic nerve pain    HPI Cynthia Cochran is a 71 y.o. female.   Patient presents today for chronic back pain that is acutely worsened the past couple of days.  She reports known history of sciatic pain on the right side of her low back; the pain recently has increased to a 9-10 out of 10 and is a sharp shooting pain.  It radiates down both of her legs to the shin.  She denies any recent accident, trauma, fall or injury.  She has been taking ibuprofen, Tylenol, using ice and heat, and a sciatic belt without relief.  Patient denies numbness or tingling in her toes, saddle anesthesia, new bowel or bladder incontinence, fevers, nausea/vomiting, or dysuria/urinary frequency since her symptoms started.     Past Medical History:  Diagnosis Date   Anxiety    Arthritis    Blood transfusion without reported diagnosis 1982   after c section   Dysphagia 04/25/2016   Gastric ulcer    GERD (gastroesophageal reflux disease)    Heart murmur    High cholesterol 04/25/2016   Hyperlipidemia    Mixed stress and urge urinary incontinence 09/09/2015   Sciatic nerve pain    Spinal stenosis     Patient Active Problem List   Diagnosis Date Noted   Pelvic relaxation due to cystocele, midline 04/15/2021   Postmenopausal 04/15/2021   Rectal pain 04/05/2021   Anal fissure 12/21/2020   Constipation 08/12/2020   Thoracic spinal stenosis 08/05/2020   Gait disturbance 11/04/2018   Numbness and tingling 11/04/2018   Lumbar radiculopathy 11/04/2018   Cervical stenosis of spinal canal 11/04/2018   PUD (peptic ulcer disease) 05/10/2018   Anemia 05/10/2018   Special screening for malignant neoplasms, colon 05/10/2018   Abdominal pain 04/07/2018   Hypokalemia 04/07/2018   Osteoarthritis 04/07/2018   Elevated blood pressure reading 04/07/2018   Family  hx of colon cancer 02/27/2018   Screening for colorectal cancer 11/06/2017   Encounter for well woman exam with routine gynecological exam 11/06/2017   High cholesterol 04/25/2016   Dysphagia 04/25/2016   Esophageal dysphagia 04/25/2016   Mixed stress and urge urinary incontinence 09/09/2015    Past Surgical History:  Procedure Laterality Date   BIOPSY  05/12/2016   Procedure: BIOPSY;  Surgeon: Rogene Houston, MD;  [facility];  Service: Endoscopy;;  gastric   BIOPSY  10/04/2018   Procedure: BIOPSY;  Surgeon: Rogene Houston, MD;  [facility];  Service: Endoscopy;;  gastric    CARPAL TUNNEL RELEASE     right; 1998, left [place]   COLONOSCOPY WITH PROPOFOL N/A 10/04/2018   Procedure: COLONOSCOPY WITH PROPOFOL;  Surgeon: Rogene Houston, MD;  [facility];  Service: Endoscopy;  Laterality: N/A;  7:30   Colonscopy     ESOPHAGEAL DILATION N/A 05/12/2016   Procedure: ESOPHAGEAL DILATION;  Surgeon: Rogene Houston, MD;  [facility];  Service: Endoscopy;  Laterality: N/A;   ESOPHAGOGASTRODUODENOSCOPY N/A 05/12/2016   Procedure: ESOPHAGOGASTRODUODENOSCOPY (EGD);  Surgeon: Rogene Houston, MD;  [facility];  Service: Endoscopy;  Laterality: N/A;  9:30   ESOPHAGOGASTRODUODENOSCOPY (EGD) WITH PROPOFOL N/A 10/04/2018   Procedure: ESOPHAGOGASTRODUODENOSCOPY (EGD) WITH PROPOFOL;  Surgeon: Rogene Houston, MD;  [facility];  Service: Endoscopy;  Laterality: N/A;   EYE SURGERY     lasix   FLEXIBLE SIGMOIDOSCOPY N/A 01/28/2021   Procedure: FLEXIBLE SIGMOIDOSCOPY WITH PROPOFOL;  Surgeon: Rogene Houston, MD;  [facility];  Service: Endoscopy;  Laterality: N/A;  9:30   JOINT REPLACEMENT     both hips   LUMBAR LAMINECTOMY/DECOMPRESSION MICRODISCECTOMY N/A 08/05/2020   Procedure: LAMINECTOMY THORACIC TEN-ELEVEN;  Surgeon: Ashok Pall, MD;  Location: [place];  Service: Neurosurgery;  Laterality: N/A;    LUMBAR LAMINECTOMY/DECOMPRESSION MICRODISCECTOMY N/A 10/14/2020   Procedure: Right Lumbar 4-5, Lumbar 5 Sacral 1 Lumbar and thoracic laminectomy;  Surgeon: Ashok Pall, MD;  Location: [place];  Service: Neurosurgery;  Laterality: N/A;  Right Lumbar 4-5, Lumbar 5 Sacral 1 Lumbar and thoracic laminectomy   TOTAL HIP ARTHROPLASTY     right; March 2013; left March 2011    OB History     Gravida  3   Para  1   Term      Preterm  1   AB  2   Living  0      SAB  2   IAB      Ectopic      Multiple      Live Births               Home Medications    Prior to Admission medications   Medication Sig Start Date End Date Taking? Authorizing Provider  predniSONE (DELTASONE) 50 MG tablet Take 1 tablet (50 mg total) by mouth daily with breakfast for 5 days. 08/16/21 08/21/21 Yes Eulogio Bear, NP  tiZANidine (ZANAFLEX) 4 MG tablet Take 1 tablet (4 mg total) by mouth every 8 (eight) hours as needed for muscle spasms. Do not take while driving or operating heavy machinery 08/16/21  Yes Eulogio Bear, NP  acetaminophen (TYLENOL) 650 MG CR tablet Take 1,300 mg by mouth every 8 (eight) hours as needed for pain.    [provider]  amitriptyline (ELAVIL) 50 MG tablet Take 1 tablet (50 mg total) by mouth at bedtime. 05/30/21   Rehman, Mechele Dawley, MD  atorvastatin (LIPITOR) 80 MG tablet Take 80 mg by mouth at bedtime.  12/14/11   [provider]  Cholecalciferol (DIALYVITE VITAMIN D 5000) 125 MCG (5000 UT) capsule Take 5,000 Units by mouth daily.    [provider]  COLLAGEN PO Take 1 capsule by mouth daily.    [provider]  estradiol (ESTRACE) 0.1 MG/GM vaginal cream Use 1 grams every other night 06/13/21   Florian Buff, MD  ezetimibe (ZETIA) 10 MG tablet Take 10 mg by mouth at bedtime.     [provider]  gabapentin (NEURONTIN) 300 MG capsule Take 1 capsule (300 mg total) by mouth 3 (three) times daily. 07/14/21   Florian Buff, MD   HYDROcodone-acetaminophen (NORCO/VICODIN) 5-325 MG tablet Take 1 tablet by mouth 3 (three) times daily as needed for moderate pain.    [provider]  LORazepam (ATIVAN) 2 MG tablet Take 2 mg by mouth at bedtime. 08/18/15   [provider]  losartan (COZAAR) 25 MG tablet Take 25 mg by mouth daily. 10/06/20   [provider]  polyethylene glycol (MIRALAX / GLYCOLAX) 17 g packet Take 17 g by mouth daily. One capful daily in coffee    [provider]  Psyllium (METAMUCIL PO) Take 1 Dose by mouth daily. 1 dose = 2 teaspoons  [provider]  Specialty Vitamins Products (BIOTIN PLUS KERATIN PO) Take 1 capsule by mouth daily.    [provider]  Turmeric Curcumin 500 MG CAPS Take 500 mg by mouth daily.    [provider]    Family History Family History  Problem Relation Age of Onset   Colon cancer Father 39   Colon cancer Brother 58   Other Mother        heavy smoker; died from arterial sclerosis   Hypertension Sister    Stomach cancer Neg Hx     Social History Social History   Tobacco Use   Smoking status: Never   Smokeless tobacco: Never  Vaping Use   Vaping Use: Never used  Substance Use Topics   Alcohol use: Yes    Comment: social drinking - a glass of wine about every 2-3 weeka   Drug use: No     Allergies   Lyrica [pregabalin], Mobic [meloxicam], and Nsaids   Review of Systems Review of Systems Per HPI  Physical Exam Triage Vital Signs ED Triage Vitals  Enc Vitals Group     BP 08/16/21 1256 (!) 169/82     Pulse Rate 08/16/21 1256 62     Resp 08/16/21 1256 16     Temp 08/16/21 1256 97.8 F (36.6 C)     Temp Source 08/16/21 1256 Oral     SpO2 08/16/21 1256 99 %     Weight --      Height --      Head Circumference --      Peak Flow --      Pain Score 08/16/21 1254 10     Pain Loc --      Pain Edu? --      Excl. in [place]? --    No data found.  Updated Vital Signs BP (!) 169/82 (BP Location:  Right Arm)   Pulse 62   Temp 97.8 F (36.6 C) (Oral)   Resp 16   SpO2 99%   Visual Acuity Right Eye Distance:   Left Eye Distance:   Bilateral Distance:    Right Eye Near:   Left Eye Near:    Bilateral Near:     Physical Exam Vitals and nursing note reviewed.  Constitutional:      General: She is not in acute distress.    Appearance: Normal appearance. She is not toxic-appearing.  Pulmonary:     Effort: Pulmonary effort is normal. No respiratory distress.  Musculoskeletal:     Lumbar back: Tenderness present. No swelling, deformity or spasms.       Back:     Right lower leg: No tenderness or bony tenderness.     Left lower leg: No tenderness or bony tenderness.     Right ankle: Normal pulse.     Left ankle: Normal pulse.     Right foot: Normal. Normal capillary refill. Normal pulse.     Left foot: Normal capillary refill. Normal pulse.     Comments: TTP in the area marked; no obvious deformity, swelling, or erythema.  Full range of motion right and left hip.  Skin:    General: Skin is warm and dry.     Capillary Refill: Capillary refill takes less than 2 seconds.     Coloration: Skin is not jaundiced or pale.     Findings: No erythema.  Neurological:     Mental Status: She is alert and oriented to person, place, and time.  Motor: No weakness.     Gait: Gait normal.  Psychiatric:        Behavior: Behavior is cooperative.     UC Treatments / Results  Labs (all labs ordered are listed, but only abnormal results are displayed) Labs Reviewed - No data to display  EKG   Radiology No results found.  Procedures Procedures (including critical care time)  Medications Ordered in UC Medications  dexamethasone (DECADRON) injection 10 mg (10 mg Intramuscular Given 08/16/21 1342)    Initial Impression / Assessment and Plan / UC Course  I have reviewed the triage vital signs and the nursing notes.  Pertinent labs & imaging results that were available during my  care of the patient were reviewed by me and considered in my medical decision making (see chart for details).    Treat acute right-sided low back pain with bilateral sciatica IM injection of Decadron 10 mg today.  Encouraged her to start prednisone 50 mg daily for 5 days tomorrow.  Can also use tizanidine 4 mg at nighttime or every 8 hours as needed to help with muscular pain.  I encouraged her to seek care if symptoms persist or worsen despite treatment.  Final Clinical Impressions(s) / UC Diagnoses   Final diagnoses:  Acute right-sided low back pain with bilateral sciatica     Discharge Instructions      - We have given you an injection of Decadron today to help with pain and inflammation in your back - Please start the prednisone tomorrow morning - You can continue to use Tylenol, tizanidine 4 mg, and ice/heat/stretching to help with the pain - Please seek care if your symptoms persist or worsen despite treatment    ED Prescriptions     Medication Sig Dispense Auth. Provider   predniSONE (DELTASONE) 50 MG tablet Take 1 tablet (50 mg total) by mouth daily with breakfast for 5 days. 5 tablet Noemi Chapel A, NP   tiZANidine (ZANAFLEX) 4 MG tablet Take 1 tablet (4 mg total) by mouth every 8 (eight) hours as needed for muscle spasms. Do not take while driving or operating heavy machinery 30 tablet Eulogio Bear, NP      PDMP not reviewed this encounter.   Eulogio Bear, NP 08/16/21 1500

## 2021-08-17 DIAGNOSIS — R198 Other specified symptoms and signs involving the digestive system and abdomen: Secondary | ICD-10-CM | POA: Diagnosis not present

## 2021-09-05 ENCOUNTER — Ambulatory Visit: Payer: Medicare Other | Admitting: Obstetrics & Gynecology

## 2021-09-06 ENCOUNTER — Ambulatory Visit: Payer: Medicare Other | Admitting: Obstetrics & Gynecology

## 2021-09-08 DIAGNOSIS — R198 Other specified symptoms and signs involving the digestive system and abdomen: Secondary | ICD-10-CM | POA: Diagnosis not present

## 2021-09-14 DIAGNOSIS — M5136 Other intervertebral disc degeneration, lumbar region: Secondary | ICD-10-CM | POA: Diagnosis not present

## 2021-09-14 DIAGNOSIS — M546 Pain in thoracic spine: Secondary | ICD-10-CM | POA: Diagnosis not present

## 2021-09-14 DIAGNOSIS — M9903 Segmental and somatic dysfunction of lumbar region: Secondary | ICD-10-CM | POA: Diagnosis not present

## 2021-09-14 DIAGNOSIS — M9902 Segmental and somatic dysfunction of thoracic region: Secondary | ICD-10-CM | POA: Diagnosis not present

## 2021-09-14 DIAGNOSIS — M9905 Segmental and somatic dysfunction of pelvic region: Secondary | ICD-10-CM | POA: Diagnosis not present

## 2021-09-20 DIAGNOSIS — R198 Other specified symptoms and signs involving the digestive system and abdomen: Secondary | ICD-10-CM | POA: Diagnosis not present

## 2021-09-21 DIAGNOSIS — M546 Pain in thoracic spine: Secondary | ICD-10-CM | POA: Diagnosis not present

## 2021-09-21 DIAGNOSIS — M9903 Segmental and somatic dysfunction of lumbar region: Secondary | ICD-10-CM | POA: Diagnosis not present

## 2021-09-21 DIAGNOSIS — M9905 Segmental and somatic dysfunction of pelvic region: Secondary | ICD-10-CM | POA: Diagnosis not present

## 2021-09-21 DIAGNOSIS — M5136 Other intervertebral disc degeneration, lumbar region: Secondary | ICD-10-CM | POA: Diagnosis not present

## 2021-09-21 DIAGNOSIS — M9902 Segmental and somatic dysfunction of thoracic region: Secondary | ICD-10-CM | POA: Diagnosis not present

## 2021-09-26 DIAGNOSIS — S43432D Superior glenoid labrum lesion of left shoulder, subsequent encounter: Secondary | ICD-10-CM | POA: Diagnosis not present

## 2021-09-26 DIAGNOSIS — M25512 Pain in left shoulder: Secondary | ICD-10-CM | POA: Diagnosis not present

## 2021-10-04 DIAGNOSIS — R198 Other specified symptoms and signs involving the digestive system and abdomen: Secondary | ICD-10-CM | POA: Diagnosis not present

## 2021-10-04 DIAGNOSIS — I10 Essential (primary) hypertension: Secondary | ICD-10-CM | POA: Diagnosis not present

## 2021-10-04 DIAGNOSIS — E782 Mixed hyperlipidemia: Secondary | ICD-10-CM | POA: Diagnosis not present

## 2021-10-05 DIAGNOSIS — M546 Pain in thoracic spine: Secondary | ICD-10-CM | POA: Diagnosis not present

## 2021-10-05 DIAGNOSIS — M9903 Segmental and somatic dysfunction of lumbar region: Secondary | ICD-10-CM | POA: Diagnosis not present

## 2021-10-05 DIAGNOSIS — M9902 Segmental and somatic dysfunction of thoracic region: Secondary | ICD-10-CM | POA: Diagnosis not present

## 2021-10-05 DIAGNOSIS — M5136 Other intervertebral disc degeneration, lumbar region: Secondary | ICD-10-CM | POA: Diagnosis not present

## 2021-10-05 DIAGNOSIS — M9905 Segmental and somatic dysfunction of pelvic region: Secondary | ICD-10-CM | POA: Diagnosis not present

## 2021-10-10 DIAGNOSIS — R202 Paresthesia of skin: Secondary | ICD-10-CM | POA: Diagnosis not present

## 2021-10-10 DIAGNOSIS — M5442 Lumbago with sciatica, left side: Secondary | ICD-10-CM | POA: Diagnosis not present

## 2021-10-10 DIAGNOSIS — K6289 Other specified diseases of anus and rectum: Secondary | ICD-10-CM | POA: Diagnosis not present

## 2021-10-10 DIAGNOSIS — R011 Cardiac murmur, unspecified: Secondary | ICD-10-CM | POA: Insufficient documentation

## 2021-10-10 DIAGNOSIS — D692 Other nonthrombocytopenic purpura: Secondary | ICD-10-CM | POA: Diagnosis not present

## 2021-10-10 DIAGNOSIS — F321 Major depressive disorder, single episode, moderate: Secondary | ICD-10-CM | POA: Diagnosis not present

## 2021-10-10 DIAGNOSIS — N8110 Cystocele, unspecified: Secondary | ICD-10-CM | POA: Diagnosis not present

## 2021-10-10 DIAGNOSIS — N952 Postmenopausal atrophic vaginitis: Secondary | ICD-10-CM | POA: Diagnosis not present

## 2021-10-10 DIAGNOSIS — E782 Mixed hyperlipidemia: Secondary | ICD-10-CM | POA: Diagnosis not present

## 2021-10-10 DIAGNOSIS — K277 Chronic peptic ulcer, site unspecified, without hemorrhage or perforation: Secondary | ICD-10-CM | POA: Diagnosis not present

## 2021-10-10 DIAGNOSIS — F411 Generalized anxiety disorder: Secondary | ICD-10-CM | POA: Diagnosis not present

## 2021-10-10 DIAGNOSIS — M4802 Spinal stenosis, cervical region: Secondary | ICD-10-CM | POA: Diagnosis not present

## 2021-10-10 DIAGNOSIS — M542 Cervicalgia: Secondary | ICD-10-CM | POA: Diagnosis not present

## 2021-10-11 DIAGNOSIS — R198 Other specified symptoms and signs involving the digestive system and abdomen: Secondary | ICD-10-CM | POA: Diagnosis not present

## 2021-10-16 ENCOUNTER — Ambulatory Visit
Admission: EM | Admit: 2021-10-16 | Discharge: 2021-10-16 | Disposition: A | Discharge status: Home / Self Care | Payer: Medicare Other | Attending: Nurse Practitioner | Admitting: Nurse Practitioner

## 2021-10-16 ENCOUNTER — Encounter: Payer: Self-pay | Admitting: Emergency Medicine

## 2021-10-16 DIAGNOSIS — M5442 Lumbago with sciatica, left side: Secondary | ICD-10-CM

## 2021-10-16 DIAGNOSIS — M5441 Lumbago with sciatica, right side: Secondary | ICD-10-CM

## 2021-10-16 MED ORDER — PREDNISONE 50 MG PO TABS: ORAL_TABLET | ORAL | 0 refills | Status: DC

## 2021-10-16 MED ORDER — TIZANIDINE HCL 4 MG PO TABS: 4.0000 mg | ORAL_TABLET | Freq: Four times a day (QID) | ORAL | 0 refills | Status: DC | PRN

## 2021-10-16 MED ORDER — DEXAMETHASONE SODIUM PHOSPHATE 10 MG/ML IJ SOLN
10.0000 mg | INTRAMUSCULAR | Status: AC
  Administered 2021-10-16: 10 mg via INTRAMUSCULAR

## 2021-10-16 MED ORDER — KETOROLAC TROMETHAMINE 30 MG/ML IJ SOLN
30.0000 mg | Freq: Once | INTRAMUSCULAR | Status: AC
  Administered 2021-10-16: 30 mg via INTRAMUSCULAR

## 2021-10-16 NOTE — Discharge Instructions (Signed)
Take medication as prescribed. Perform stretching exercises provided for your low back pain. May apply ice or heat as needed.  Ice for pain or swelling, heat for spasm or stiffness.  Apply for 20 minutes, remove for 1 hour, then repeat as needed. Go to the emergency department immediately if you develop loss of bowel or bladder function, inability to ambulate, or worsening numbness or tingling that is different from baseline. Follow-up with your primary care physician if symptoms do not improve.

## 2021-10-16 NOTE — ED Triage Notes (Signed)
Right lower back and hip x 2 weeks.  States pain has become worse.  Has tried massage therapy and tylenol without relief.

## 2021-10-16 NOTE — ED Provider Notes (Signed)
[facility]    CSN: [id number] Arrival date & time: 10/16/21  0849      History   Chief Complaint No chief complaint on file.   HPI Cynthia Cochran is a 71 y.o. female.   The history is provided by the patient.   Patient presents today for recurrent chronic back pain that is acutely worsened the past 2 weeks.  She reports known history of sciatic pain on the right side of her low back.  Patient reports she is currently under the care of of urogynecology due to weak pelvic wall muscles.  States that she has been undergoing therapy for her symptoms and wonders if this is aggravating her sciatica.  She describes pain as a sharp shooting pain.  It radiates down both of her legs to the shin.  Is experiencing numbness in the bilateral lower extremities, but worse on the left due to previous history of thoracic spine surgery.  She denies any recent accident, trauma, fall or injury.  She has been taking Tylenol,, using massage without relief.  Patient denies saddle anesthesia, new bowel or bladder incontinence, fevers, nausea/vomiting, or dysuria/urinary frequency.  Patient was treated for the same or similar symptoms in June.  States that she did experience good relief with the treatment provided at that time.  Past Medical History:  Diagnosis Date   Anxiety    Arthritis    Blood transfusion without reported diagnosis 1982   after c section   Dysphagia 04/25/2016   Gastric ulcer    GERD (gastroesophageal reflux disease)    Heart murmur    High cholesterol 04/25/2016   Hyperlipidemia    Mixed stress and urge urinary incontinence 09/09/2015   Sciatic nerve pain    Spinal stenosis     Patient Active Problem List   Diagnosis Date Noted   Pelvic relaxation due to cystocele, midline 04/15/2021   Postmenopausal 04/15/2021   Rectal pain 04/05/2021   Anal fissure 12/21/2020   Constipation 08/12/2020   Thoracic spinal stenosis 08/05/2020   Gait disturbance 11/04/2018   Numbness  and tingling 11/04/2018   Lumbar radiculopathy 11/04/2018   Cervical stenosis of spinal canal 11/04/2018   PUD (peptic ulcer disease) 05/10/2018   Anemia 05/10/2018   Special screening for malignant neoplasms, colon 05/10/2018   Abdominal pain 04/07/2018   Hypokalemia 04/07/2018   Osteoarthritis 04/07/2018   Elevated blood pressure reading 04/07/2018   Family hx of colon cancer 02/27/2018   Screening for colorectal cancer 11/06/2017   Encounter for well woman exam with routine gynecological exam 11/06/2017   High cholesterol 04/25/2016   Dysphagia 04/25/2016   Esophageal dysphagia 04/25/2016   Mixed stress and urge urinary incontinence 09/09/2015    Past Surgical History:  Procedure Laterality Date   BIOPSY  05/12/2016   Procedure: BIOPSY;  Surgeon: Rogene Houston, MD;  [facility];  Service: Endoscopy;;  gastric   BIOPSY  10/04/2018   Procedure: BIOPSY;  Surgeon: Rogene Houston, MD;  [facility];  Service: Endoscopy;;  gastric    CARPAL TUNNEL RELEASE     right; 1998, left [place]   COLONOSCOPY WITH PROPOFOL N/A 10/04/2018   Procedure: COLONOSCOPY WITH PROPOFOL;  Surgeon: Rogene Houston, MD;  [facility];  Service: Endoscopy;  Laterality: N/A;  7:30   Colonscopy     ESOPHAGEAL DILATION N/A 05/12/2016   Procedure: ESOPHAGEAL DILATION;  Surgeon: Rogene Houston, MD;  [facility];  Service: Endoscopy;  Laterality: N/A;   ESOPHAGOGASTRODUODENOSCOPY N/A 05/12/2016   Procedure: ESOPHAGOGASTRODUODENOSCOPY (EGD);  Surgeon: Rogene Houston, MD;  [facility];  Service: Endoscopy;  Laterality: N/A;  9:30   ESOPHAGOGASTRODUODENOSCOPY (EGD) WITH PROPOFOL N/A 10/04/2018   Procedure: ESOPHAGOGASTRODUODENOSCOPY (EGD) WITH PROPOFOL;  Surgeon: Rogene Houston, MD;  [facility];  Service: Endoscopy;  Laterality: N/A;   EYE SURGERY     lasix   FLEXIBLE SIGMOIDOSCOPY N/A 01/28/2021   Procedure: FLEXIBLE  SIGMOIDOSCOPY WITH PROPOFOL;  Surgeon: Rogene Houston, MD;  [facility];  Service: Endoscopy;  Laterality: N/A;  9:30   JOINT REPLACEMENT     both hips   LUMBAR LAMINECTOMY/DECOMPRESSION MICRODISCECTOMY N/A 08/05/2020   Procedure: LAMINECTOMY THORACIC TEN-ELEVEN;  Surgeon: Ashok Pall, MD;  Location: [place];  Service: Neurosurgery;  Laterality: N/A;   LUMBAR LAMINECTOMY/DECOMPRESSION MICRODISCECTOMY N/A 10/14/2020   Procedure: Right Lumbar 4-5, Lumbar 5 Sacral 1 Lumbar and thoracic laminectomy;  Surgeon: Ashok Pall, MD;  Location: [place];  Service: Neurosurgery;  Laterality: N/A;  Right Lumbar 4-5, Lumbar 5 Sacral 1 Lumbar and thoracic laminectomy   TOTAL HIP ARTHROPLASTY     right; March 2013; left March 2011    OB History     Gravida  3   Para  1   Term      Preterm  1   AB  2   Living  0      SAB  2   IAB      Ectopic      Multiple      Live Births               Home Medications    Prior to Admission medications   Medication Sig Start Date End Date Taking? Authorizing Provider  predniSONE (DELTASONE) 50 MG tablet Take 1 tablet daily with breakfast for 5 days. 10/16/21  Yes Ulises Wolfinger-Warren, Alda Lea, NP  tiZANidine (ZANAFLEX) 4 MG tablet Take 1 tablet (4 mg total) by mouth every 6 (six) hours as needed for muscle spasms. 10/16/21  Yes Khanh Cordner-Warren, Alda Lea, NP  acetaminophen (TYLENOL) 650 MG CR tablet Take 1,300 mg by mouth every 8 (eight) hours as needed for pain.    [provider]  amitriptyline (ELAVIL) 50 MG tablet Take 1 tablet (50 mg total) by mouth at bedtime. 05/30/21   Rehman, Mechele Dawley, MD  atorvastatin (LIPITOR) 80 MG tablet Take 80 mg by mouth at bedtime.  12/14/11   [provider]  Cholecalciferol (DIALYVITE VITAMIN D 5000) 125 MCG (5000 UT) capsule Take 5,000 Units by mouth daily.    [provider]  COLLAGEN PO Take 1 capsule by mouth daily.    [provider]  estradiol (ESTRACE) 0.1 MG/GM vaginal  cream Use 1 grams every other night 06/13/21   Florian Buff, MD  ezetimibe (ZETIA) 10 MG tablet Take 10 mg by mouth at bedtime.     [provider]  gabapentin (NEURONTIN) 300 MG capsule Take 1 capsule (300 mg total) by mouth 3 (three) times daily. 07/14/21   Florian Buff, MD  HYDROcodone-acetaminophen (NORCO/VICODIN) 5-325 MG tablet Take 1 tablet by mouth 3 (three) times daily as needed for moderate pain.    [provider]  LORazepam (ATIVAN) 2 MG tablet Take 2 mg by mouth at bedtime. 08/18/15   [provider]  losartan (COZAAR) 25 MG tablet Take 25 mg by mouth daily. 10/06/20   [provider]  polyethylene glycol (MIRALAX / GLYCOLAX) 17 g packet Take 17 g by mouth daily. One capful daily in coffee    [provider]  Psyllium (METAMUCIL PO) Take 1 Dose by mouth daily. 1 dose = 2 teaspoons    [provider]  Specialty Vitamins Products (BIOTIN PLUS KERATIN PO) Take 1 capsule by mouth daily.    [provider]  Turmeric Curcumin 500 MG CAPS Take 500 mg by mouth daily.    [provider]    Family History Family History  Problem Relation Age of Onset   Colon cancer Father 68   Colon cancer Brother 19   Other Mother        heavy smoker; died from arterial sclerosis   Hypertension Sister    Stomach cancer Neg Hx     Social History Social History   Tobacco Use   Smoking status: Never   Smokeless tobacco: Never  Vaping Use   Vaping Use: Never used  Substance Use Topics   Alcohol use: Yes    Comment: social drinking - a glass of wine about every 2-3 weeka   Drug use: No     Allergies   Lyrica [pregabalin], Mobic [meloxicam], and Nsaids   Review of Systems Review of Systems Per HPI  Physical Exam Triage Vital Signs ED Triage Vitals  Enc Vitals Group     BP 10/16/21 0853 (!) 191/97     Pulse Rate 10/16/21 0853 66     Resp 10/16/21 0853 18     Temp 10/16/21 0853 98.2 F (36.8 C)     Temp Source  10/16/21 0853 Oral     SpO2 10/16/21 0853 97 %     Weight --      Height --      Head Circumference --      Peak Flow --      Pain Score 10/16/21 0856 10     Pain Loc --      Pain Edu? --      Excl. in [place]? --    No data found.  Updated Vital Signs BP (!) 191/97 (BP Location: Right Arm)   Pulse 66   Temp 98.2 F (36.8 C) (Oral)   Resp 18   SpO2 97%   Visual Acuity Right Eye Distance:   Left Eye Distance:   Bilateral Distance:    Right Eye Near:   Left Eye Near:    Bilateral Near:     Physical Exam Vitals and nursing note reviewed.  Constitutional:      Appearance: Normal appearance. She is not toxic-appearing.  HENT:     Head: Normocephalic.  Eyes:     Extraocular Movements: Extraocular movements intact.     Pupils: Pupils are equal, round, and reactive to light.  Cardiovascular:     Rate and Rhythm: Normal rate.     Pulses: Normal pulses.     Heart sounds: Normal heart sounds.  Pulmonary:     Effort: Pulmonary effort is normal.     Breath sounds: Normal breath sounds.  Abdominal:     General: Bowel sounds are normal.     Palpations: Abdomen is soft.     Tenderness: There is no abdominal tenderness.  Musculoskeletal:     Cervical back: Normal range of motion and neck supple.     Lumbar back: Tenderness present. Decreased range of motion.       Back:  Skin:    General: Skin is warm  and dry.  Neurological:     General: No focal deficit present.     Mental Status: She is alert and oriented to person, place, and time.  Psychiatric:        Mood and Affect: Mood normal.        Behavior: Behavior normal.      UC Treatments / Results  Labs (all labs ordered are listed, but only abnormal results are displayed) Labs Reviewed - No data to display  EKG   Radiology No results found.  Procedures Procedures (including critical care time)  Medications Ordered in UC Medications  ketorolac (TORADOL) 30 MG/ML injection 30 mg (30 mg Intramuscular Given  10/16/21 0919)  dexamethasone (DECADRON) injection 10 mg (10 mg Intramuscular Given 10/16/21 0919)    Initial Impression / Assessment and Plan / UC Course  I have reviewed the triage vital signs and the nursing notes.  Pertinent labs & imaging results that were available during my care of the patient were reviewed by me and considered in my medical decision making (see chart for details).  Symptoms appear to be recurrent right-sided low back pain with sciatica.  No red flag symptoms seen on exam.  Patient was treated with Decadron 10 mg IM injection and Toradol 30 mg IM injection in the clinic today.  We will start patient on prednisone 50 mg for 5 days and tizanidine 4 mg at bedtime as needed for pain.  Patient was encouraged to follow-up with her primary care physician if her symptoms do not improve.  Patient was Given strict indications of when to go to the emergency department.  Patient advised to follow-up as needed. Final Clinical Impressions(s) / UC Diagnoses   Final diagnoses:  Right-sided low back pain with bilateral sciatica, unspecified chronicity     Discharge Instructions      Take medication as prescribed. Perform stretching exercises provided for your low back pain. May apply ice or heat as needed.  Ice for pain or swelling, heat for spasm or stiffness.  Apply for 20 minutes, remove for 1 hour, then repeat as needed. Go to the emergency department immediately if you develop loss of bowel or bladder function, inability to ambulate, or worsening numbness or tingling that is different from baseline. Follow-up with your primary care physician if symptoms do not improve.     ED Prescriptions     Medication Sig Dispense Auth. Provider   predniSONE (DELTASONE) 50 MG tablet Take 1 tablet daily with breakfast for 5 days. 5 tablet Verona Hartshorn-Warren, Sadie Haber, NP   tiZANidine (ZANAFLEX) 4 MG tablet Take 1 tablet (4 mg total) by mouth every 6 (six) hours as needed for muscle spasms. 30  tablet Krithik Mapel-Warren, Sadie Haber, NP      PDMP not reviewed this encounter.   Abran Cantor, NP 10/16/21 [phone]

## 2021-10-18 DIAGNOSIS — M9903 Segmental and somatic dysfunction of lumbar region: Secondary | ICD-10-CM | POA: Diagnosis not present

## 2021-10-18 DIAGNOSIS — M546 Pain in thoracic spine: Secondary | ICD-10-CM | POA: Diagnosis not present

## 2021-10-18 DIAGNOSIS — M5136 Other intervertebral disc degeneration, lumbar region: Secondary | ICD-10-CM | POA: Diagnosis not present

## 2021-10-18 DIAGNOSIS — M9905 Segmental and somatic dysfunction of pelvic region: Secondary | ICD-10-CM | POA: Diagnosis not present

## 2021-10-18 DIAGNOSIS — M9902 Segmental and somatic dysfunction of thoracic region: Secondary | ICD-10-CM | POA: Diagnosis not present

## 2021-10-19 DIAGNOSIS — M5136 Other intervertebral disc degeneration, lumbar region: Secondary | ICD-10-CM | POA: Diagnosis not present

## 2021-10-19 DIAGNOSIS — M9902 Segmental and somatic dysfunction of thoracic region: Secondary | ICD-10-CM | POA: Diagnosis not present

## 2021-10-19 DIAGNOSIS — M546 Pain in thoracic spine: Secondary | ICD-10-CM | POA: Diagnosis not present

## 2021-10-19 DIAGNOSIS — M9903 Segmental and somatic dysfunction of lumbar region: Secondary | ICD-10-CM | POA: Diagnosis not present

## 2021-10-19 DIAGNOSIS — M9905 Segmental and somatic dysfunction of pelvic region: Secondary | ICD-10-CM | POA: Diagnosis not present

## 2021-10-26 DIAGNOSIS — M9905 Segmental and somatic dysfunction of pelvic region: Secondary | ICD-10-CM | POA: Diagnosis not present

## 2021-10-26 DIAGNOSIS — M546 Pain in thoracic spine: Secondary | ICD-10-CM | POA: Diagnosis not present

## 2021-10-26 DIAGNOSIS — M5136 Other intervertebral disc degeneration, lumbar region: Secondary | ICD-10-CM | POA: Diagnosis not present

## 2021-10-26 DIAGNOSIS — M9903 Segmental and somatic dysfunction of lumbar region: Secondary | ICD-10-CM | POA: Diagnosis not present

## 2021-10-26 DIAGNOSIS — M9902 Segmental and somatic dysfunction of thoracic region: Secondary | ICD-10-CM | POA: Diagnosis not present

## 2021-10-28 ENCOUNTER — Inpatient Hospital Stay: Payer: Medicare Other | Attending: Hematology | Admitting: Hematology

## 2021-10-29 ENCOUNTER — Ambulatory Visit
Admission: EM | Admit: 2021-10-29 | Discharge: 2021-10-29 | Disposition: A | Discharge status: Home / Self Care | Payer: Medicare Other | Attending: Nurse Practitioner | Admitting: Nurse Practitioner

## 2021-10-29 ENCOUNTER — Encounter: Payer: Self-pay | Admitting: Emergency Medicine

## 2021-10-29 DIAGNOSIS — M5431 Sciatica, right side: Secondary | ICD-10-CM | POA: Diagnosis not present

## 2021-10-29 MED ORDER — TIZANIDINE HCL 4 MG PO TABS: 4.0000 mg | ORAL_TABLET | Freq: Four times a day (QID) | ORAL | 0 refills | Status: DC | PRN

## 2021-10-29 MED ORDER — PREDNISONE 50 MG PO TABS: ORAL_TABLET | ORAL | 0 refills | Status: DC

## 2021-10-29 MED ORDER — KETOROLAC TROMETHAMINE 30 MG/ML IJ SOLN
30.0000 mg | Freq: Once | INTRAMUSCULAR | Status: AC
  Administered 2021-10-29: 30 mg via INTRAMUSCULAR

## 2021-10-29 MED ORDER — DEXAMETHASONE SODIUM PHOSPHATE 10 MG/ML IJ SOLN
10.0000 mg | INTRAMUSCULAR | Status: AC
  Administered 2021-10-29: 10 mg via INTRAMUSCULAR

## 2021-10-29 NOTE — Discharge Instructions (Addendum)
Take medication as prescribed.  Take Tylenol arthritis strength 650 mg tablet, 1 tablet every 8 hours as needed for pain or discomfort. Perform stretching exercises provided for your low back pain. May apply ice or heat as needed.  Ice for pain or swelling, heat for spasm or stiffness.  Apply for 20 minutes, remove for 1 hour, then repeat as needed. Go to the emergency department immediately if you develop loss of bowel or bladder function, inability to ambulate, or worsening numbness or tingling that is different from baseline. Follow-up with your primary care physician or chiropractor if symptoms do not improve.

## 2021-10-29 NOTE — ED Triage Notes (Signed)
Right hip pain since yesterday.  Pain has become worse today.  Hurts worse to walk.

## 2021-10-29 NOTE — ED Provider Notes (Signed)
[facility]    CSN: [id number] Arrival date & time: 10/29/21  3329      History   Chief Complaint No chief complaint on file.   HPI Cynthia Cochran is a 71 y.o. female.   The history is provided by the patient.   Patient presents for today for current chronic right-sided sciatica.  Patient states symptoms started approximately 2 days ago but worsened this morning when getting out of bed.  Patient states that she has difficulty walking since her pain started.  Patient denies radiation of pain today.  Pain is located in the right buttock.  She denies numbness, tingling, lower extremity weakness, recent injury, trauma or fall, paresthesia, saddle anesthesia, loss of bowel or bladder function, nausea, vomiting, or urinary symptoms.  Patient was seen approximately 2 to 3 weeks ago for the same or similar symptoms.  Patient was given Toradol injection Decadron injection and was sent home with prednisone and tizanidine.  Patient states she had good relief with this regimen.  Patient states since this time she has seen her chiropractor and he states that she may have "overdid it".  She has not taken any medication for her symptoms. Past Medical History:  Diagnosis Date   Anxiety    Arthritis    Blood transfusion without reported diagnosis 1982   after c section   Dysphagia 04/25/2016   Gastric ulcer    GERD (gastroesophageal reflux disease)    Heart murmur    High cholesterol 04/25/2016   Hyperlipidemia    Mixed stress and urge urinary incontinence 09/09/2015   Sciatic nerve pain    Spinal stenosis     Patient Active Problem List   Diagnosis Date Noted   Pelvic relaxation due to cystocele, midline 04/15/2021   Postmenopausal 04/15/2021   Rectal pain 04/05/2021   Anal fissure 12/21/2020   Constipation 08/12/2020   Thoracic spinal stenosis 08/05/2020   Gait disturbance 11/04/2018   Numbness and tingling 11/04/2018   Lumbar radiculopathy 11/04/2018   Cervical stenosis of  spinal canal 11/04/2018   PUD (peptic ulcer disease) 05/10/2018   Anemia 05/10/2018   Special screening for malignant neoplasms, colon 05/10/2018   Abdominal pain 04/07/2018   Hypokalemia 04/07/2018   Osteoarthritis 04/07/2018   Elevated blood pressure reading 04/07/2018   Family hx of colon cancer 02/27/2018   Screening for colorectal cancer 11/06/2017   Encounter for well woman exam with routine gynecological exam 11/06/2017   High cholesterol 04/25/2016   Dysphagia 04/25/2016   Esophageal dysphagia 04/25/2016   Mixed stress and urge urinary incontinence 09/09/2015    Past Surgical History:  Procedure Laterality Date   BIOPSY  05/12/2016   Procedure: BIOPSY;  Surgeon: Malissa Hippo, MD;  [facility]: [facility];  Service: Endoscopy;;  gastric   BIOPSY  10/04/2018   Procedure: BIOPSY;  Surgeon: Malissa Hippo, MD;  [facility]: [facility];  Service: Endoscopy;;  gastric    CARPAL TUNNEL RELEASE     right; 1998, left 1998   CESAREAN SECTION  1982   COLONOSCOPY WITH PROPOFOL N/A 10/04/2018   Procedure: COLONOSCOPY WITH PROPOFOL;  Surgeon: Malissa Hippo, MD;  Location: [facility];  Service: Endoscopy;  Laterality: N/A;  7:30   Colonscopy     ESOPHAGEAL DILATION N/A 05/12/2016   Procedure: ESOPHAGEAL DILATION;  Surgeon: Malissa Hippo, MD;  Location: [facility];  Service: Endoscopy;  Laterality: N/A;   ESOPHAGOGASTRODUODENOSCOPY N/A 05/12/2016   Procedure: ESOPHAGOGASTRODUODENOSCOPY (EGD);  Surgeon: Ok Anis  Cline Crock, MD;  Location: [facility];  Service: Endoscopy;  Laterality: N/A;  9:30   ESOPHAGOGASTRODUODENOSCOPY (EGD) WITH PROPOFOL N/A 10/04/2018   Procedure: ESOPHAGOGASTRODUODENOSCOPY (EGD) WITH PROPOFOL;  Surgeon: Malissa Hippo, MD;  [facility]: [facility];  Service: Endoscopy;  Laterality: N/A;   EYE SURGERY     lasix   FLEXIBLE SIGMOIDOSCOPY N/A 01/28/2021   Procedure: FLEXIBLE SIGMOIDOSCOPY WITH PROPOFOL;  Surgeon: Malissa Hippo, MD;  [facility];  Service: Endoscopy;  Laterality: N/A;  9:30   JOINT REPLACEMENT     both hips   LUMBAR LAMINECTOMY/DECOMPRESSION MICRODISCECTOMY N/A 08/05/2020   Procedure: LAMINECTOMY THORACIC TEN-ELEVEN;  Surgeon: Coletta Memos, MD;  Location: [facility];  Service: Neurosurgery;  Laterality: N/A;   LUMBAR LAMINECTOMY/DECOMPRESSION MICRODISCECTOMY N/A 10/14/2020   Procedure: Right Lumbar 4-5, Lumbar 5 Sacral 1 Lumbar and thoracic laminectomy;  Surgeon: Coletta Memos, MD;  Location: [facility];  Service: Neurosurgery;  Laterality: N/A;  Right Lumbar 4-5, Lumbar 5 Sacral 1 Lumbar and thoracic laminectomy   TOTAL HIP ARTHROPLASTY     right; March 2013; left March 2011    OB History     Gravida  3   Para  1   Term      Preterm  1   AB  2   Living  0      SAB  2   IAB      Ectopic      Multiple      Live Births               Home Medications    Prior to Admission medications   Medication Sig Start Date End Date Taking? Authorizing Provider  acetaminophen (TYLENOL) 650 MG CR tablet Take 1,300 mg by mouth every 8 (eight) hours as needed for pain.    [provider]  amitriptyline (ELAVIL) 50 MG tablet Take 1 tablet (50 mg total) by mouth at bedtime. 05/30/21   Rehman, Joline Maxcy, MD  atorvastatin (LIPITOR) 80 MG tablet Take 80 mg by mouth at bedtime.  12/14/11   [provider]  Cholecalciferol (DIALYVITE VITAMIN D 5000) 125 MCG (5000 UT) capsule Take 5,000 Units by mouth daily.    [provider]  COLLAGEN PO Take 1 capsule by mouth daily.    [provider]  estradiol (ESTRACE) 0.1 MG/GM vaginal cream Use 1 grams every other night 06/13/21   Lazaro Arms, MD  ezetimibe (ZETIA) 10 MG tablet Take 10 mg by mouth at bedtime.     [provider]  gabapentin (NEURONTIN) 300 MG capsule Take 1 capsule (300 mg total) by mouth 3 (three) times daily. 07/14/21   Lazaro Arms, MD  HYDROcodone-acetaminophen (NORCO/VICODIN) 5-325 MG tablet Take 1 tablet by  mouth 3 (three) times daily as needed for moderate pain.    [provider]  LORazepam (ATIVAN) 2 MG tablet Take 2 mg by mouth at bedtime. 08/18/15   [provider]  losartan (COZAAR) 25 MG tablet Take 25 mg by mouth daily. 10/06/20   [provider]  polyethylene glycol (MIRALAX / GLYCOLAX) 17 g packet Take 17 g by mouth daily. One capful daily in coffee    [provider]  predniSONE (DELTASONE) 50 MG tablet Take 1 tablet daily with breakfast for 5 days. 10/16/21   Tonyia Marschall-Warren, Sadie Haber, NP  Psyllium (METAMUCIL PO) Take 1 Dose by mouth daily. 1 dose = 2 teaspoons    [provider]  Specialty  Vitamins Products (BIOTIN PLUS KERATIN PO) Take 1 capsule by mouth daily.    [provider]  tiZANidine (ZANAFLEX) 4 MG tablet Take 1 tablet (4 mg total) by mouth every 6 (six) hours as needed for muscle spasms. 10/16/21   Cary Wilford-Warren, Sadie Haber, NP  Turmeric Curcumin 500 MG CAPS Take 500 mg by mouth daily.    [provider]    Family History Family History  Problem Relation Age of Onset   Colon cancer Father 48   Colon cancer Brother 51   Other Mother        heavy smoker; died from arterial sclerosis   Hypertension Sister    Stomach cancer Neg Hx     Social History Social History   Tobacco Use   Smoking status: Never   Smokeless tobacco: Never  Vaping Use   Vaping Use: Never used  Substance Use Topics   Alcohol use: Yes    Comment: social drinking - a glass of wine about every 2-3 weeka   Drug use: No     Allergies   Lyrica [pregabalin], Mobic [meloxicam], and Nsaids   Review of Systems Review of Systems Per HPI  Physical Exam Triage Vital Signs ED Triage Vitals [10/29/21 0828]  Enc Vitals Group     BP (!) 187/83     Pulse Rate 74     Resp 18     Temp 98.5 F (36.9 C)     Temp Source Oral     SpO2 99 %     Weight      Height      Head Circumference      Peak Flow      Pain Score 10     Pain Loc       Pain Edu?      Excl. in GC?    No data found.  Updated Vital Signs BP (!) 187/83 (BP Location: Right Arm)   Pulse 74   Temp 98.5 F (36.9 C) (Oral)   Resp 18   SpO2 99%   Visual Acuity Right Eye Distance:   Left Eye Distance:   Bilateral Distance:    Right Eye Near:   Left Eye Near:    Bilateral Near:     Physical Exam Vitals and nursing note reviewed.  Constitutional:      General: She is not in acute distress.    Appearance: Normal appearance.  HENT:     Head: Normocephalic.  Eyes:     Extraocular Movements: Extraocular movements intact.     Pupils: Pupils are equal, round, and reactive to light.  Cardiovascular:     Rate and Rhythm: Normal rate and regular rhythm.     Pulses: Normal pulses.     Heart sounds: Normal heart sounds.  Pulmonary:     Effort: Pulmonary effort is normal.     Breath sounds: Normal breath sounds.  Abdominal:     General: Bowel sounds are normal.     Palpations: Abdomen is soft.     Tenderness: There is no right CVA tenderness or left CVA tenderness.  Musculoskeletal:     Cervical back: Normal range of motion.     Lumbar back: No swelling or edema. Decreased range of motion. Negative right straight leg raise test and negative left straight leg raise test.     Comments: Tenderness to the right sciatic nerve  Skin:    General: Skin is warm and dry.  Neurological:     General: No  focal deficit present.     Mental Status: She is alert and oriented to person, place, and time.  Psychiatric:        Mood and Affect: Mood normal.        Behavior: Behavior normal.      UC Treatments / Results  Labs (all labs ordered are listed, but only abnormal results are displayed) Labs Reviewed - No data to display  EKG   Radiology No results found.  Procedures Procedures (including critical care time)  Medications Ordered in UC Medications - No data to display  Initial Impression / Assessment and Plan / UC Course  I have reviewed the  triage vital signs and the nursing notes.  Pertinent labs & imaging results that were available during my care of the patient were reviewed by me and considered in my medical decision making (see chart for details).  Patient presents for recurrent right-sided sciatica.  Symptoms started approximately 2 days ago.  On exam, patient has tenderness to the right sciatic nerve with decreased range of motion to include flexion and extension.  There are no red flag symptoms noted on her exam today.  Will provide Decadron 10 mg and Toradol 30 mg injections.  We will also prescribe prednisone and tizanidine for her symptoms.  Discussion with patient that she should follow-up with chiropractor.  Discussed with patient that frequent high-dose steroid use is not beneficial for her long-term.  Patient was given supportive care recommendations to include sciatica rehab.  Patient was given strict indications of when to go to the emergency department.  Patient advised to follow-up with her PCP or chiropractor as needed. Final Clinical Impressions(s) / UC Diagnoses   Final diagnoses:  None   Discharge Instructions   None    ED Prescriptions   None    PDMP not reviewed this encounter.   Abran Cantor, NP 10/29/21 [phone]

## 2021-10-31 DIAGNOSIS — R198 Other specified symptoms and signs involving the digestive system and abdomen: Secondary | ICD-10-CM | POA: Diagnosis not present

## 2021-11-11 ENCOUNTER — Inpatient Hospital Stay: Payer: Medicare Other | Attending: Hematology | Admitting: Hematology

## 2021-11-11 VITALS — BP 177/88 | HR 77 | Temp 97.8°F | Resp 18 | Ht 62.0 in | Wt 159.7 lb

## 2021-11-11 DIAGNOSIS — Z79899 Other long term (current) drug therapy: Secondary | ICD-10-CM | POA: Diagnosis not present

## 2021-11-11 DIAGNOSIS — D696 Thrombocytopenia, unspecified: Secondary | ICD-10-CM

## 2021-11-11 DIAGNOSIS — Z800 Family history of malignant neoplasm of digestive organs: Secondary | ICD-10-CM | POA: Insufficient documentation

## 2021-11-11 DIAGNOSIS — D649 Anemia, unspecified: Secondary | ICD-10-CM

## 2021-11-11 DIAGNOSIS — R233 Spontaneous ecchymoses: Secondary | ICD-10-CM | POA: Diagnosis not present

## 2021-11-11 NOTE — Patient Instructions (Addendum)
[facility]  Discharge Instructions  You were seen and examined today by Dr. Ellin Saba. Dr. Ellin Saba is a hematologist, meaning that he specializes in blood abnormalities. Dr. Ellin Saba discussed your past medical history, family history of cancers/blood conditions and the events that led to you being here today.  You were referred to Dr. Ellin Saba due to your easy bruising. Easy bruising can very well be related to medications, but Dr. Ellin Saba will rule out a true bleeding disorder if in 3 to 4 weeks the bruising has not subsided, Dr. Ellin Saba has recommended additional labs. Please let us know if the bruising does not improve since you have now stopped Lexapro.  Follow-up as scheduled.  Thank you for choosing [facility] to provide your oncology and hematology care.   To afford each patient quality time with our provider, please arrive at least 15 minutes before your scheduled appointment time. You may need to reschedule your appointment if you arrive late (10 or more minutes). Arriving late affects you and other patients whose appointments are after yours.  Also, if you miss three or more appointments without notifying the office, you may be dismissed from the clinic at the provider's discretion.    Again, thank you for choosing [facility].  Our hope is that these requests will decrease the amount of time that you wait before being seen by our physicians.   If you have a lab appointment with the Cancer Center please come in thru the Main Entrance and check in at the main information desk.           _____________________________________________________________  Should you have questions after your visit to [facility], please contact our office at [phone] and follow the prompts.  Our office hours are 8:00 a.m. to 4:30 p.m. Monday - Thursday and 8:00 a.m. to 2:30 p.m. Friday.  Please note that  voicemails left after 4:00 p.m. may not be returned until the following business day.  We are closed weekends and all major holidays.  You do have access to a nurse 24-7, just call the main number to the clinic [phone] and do not press any options, hold on the line and a nurse will answer the phone.    For prescription refill requests, have your pharmacy contact our office and allow 72 hours.    Masks are optional in the cancer centers. If you would like for your care team to wear a mask while they are taking care of you, please let them know. You may have one support person who is at least 71 years old accompany you for your appointments.

## 2021-11-11 NOTE — Progress Notes (Signed)
CONSULT NOTE  Patient Care Team: Celene Squibb, MD as PCP - General (Internal Medicine)  CHIEF COMPLAINTS/PURPOSE OF CONSULTATION:  Easy bruising on the forearms  HISTORY OF PRESENTING ILLNESS:  Cynthia Cochran 71 y.o. female is seen in consultation today at the request of Valentino Nose, FNP for further evaluation and management of bruising of the forearms over the last several months.  She reports easy bruising of both forearms and upper thighs beginning in early part of 2023.  She had 2 back surgeries in May 2022 and August 2022 and did not have any excessive bleeding.  She had dental work done including extraction of teeth which did not result in excessive bleeding.  She never had any bleeding problems after procedures.  She has been on Lexapro and duloxetine for several years and started tapering off 2 weeks ago, last dose on 11/07/2021.  She is currently started on Pristiq this week.  She reported miscarriages in the first trimester and one stillbirth.  She is not on any NSAIDs or other blood thinners.  She has been off and on prednisone for the last 2 months for about 2 times.  She took them for 5 days.  This was given for right sided sciatica.   MEDICAL HISTORY:  Past Medical History:  Diagnosis Date   Anxiety    Arthritis    Blood transfusion without reported diagnosis 1982   after c section   Dysphagia 04/25/2016   Gastric ulcer    GERD (gastroesophageal reflux disease)    Heart murmur    High cholesterol 04/25/2016   Hyperlipidemia    Mixed stress and urge urinary incontinence 09/09/2015   Sciatic nerve pain    Spinal stenosis     SURGICAL HISTORY: Past Surgical History:  Procedure Laterality Date   BIOPSY  05/12/2016   Procedure: BIOPSY;  Surgeon: Rogene Houston, MD;  [facility];  Service: Endoscopy;;  gastric   BIOPSY  10/04/2018   Procedure: BIOPSY;  Surgeon: Rogene Houston, MD;  [facility];  Service: Endoscopy;;  gastric    CARPAL TUNNEL  RELEASE     right; 1998, left [place]   COLONOSCOPY WITH PROPOFOL N/A 10/04/2018   Procedure: COLONOSCOPY WITH PROPOFOL;  Surgeon: Rogene Houston, MD;  [facility];  Service: Endoscopy;  Laterality: N/A;  7:30   Colonscopy     ESOPHAGEAL DILATION N/A 05/12/2016   Procedure: ESOPHAGEAL DILATION;  Surgeon: Rogene Houston, MD;  [facility];  Service: Endoscopy;  Laterality: N/A;   ESOPHAGOGASTRODUODENOSCOPY N/A 05/12/2016   Procedure: ESOPHAGOGASTRODUODENOSCOPY (EGD);  Surgeon: Rogene Houston, MD;  [facility];  Service: Endoscopy;  Laterality: N/A;  9:30   ESOPHAGOGASTRODUODENOSCOPY (EGD) WITH PROPOFOL N/A 10/04/2018   Procedure: ESOPHAGOGASTRODUODENOSCOPY (EGD) WITH PROPOFOL;  Surgeon: Rogene Houston, MD;  [facility];  Service: Endoscopy;  Laterality: N/A;   EYE SURGERY     lasix   FLEXIBLE SIGMOIDOSCOPY N/A 01/28/2021   Procedure: FLEXIBLE SIGMOIDOSCOPY WITH PROPOFOL;  Surgeon: Rogene Houston, MD;  [facility];  Service: Endoscopy;  Laterality: N/A;  9:30   JOINT REPLACEMENT     both hips   LUMBAR LAMINECTOMY/DECOMPRESSION MICRODISCECTOMY N/A 08/05/2020   Procedure: LAMINECTOMY THORACIC TEN-ELEVEN;  Surgeon: Ashok Pall, MD;  Location: [place];  Service: Neurosurgery;  Laterality: N/A;   LUMBAR LAMINECTOMY/DECOMPRESSION MICRODISCECTOMY N/A 10/14/2020   Procedure: Right Lumbar 4-5, Lumbar 5 Sacral  1 Lumbar and thoracic laminectomy;  Surgeon: Coletta Memos, MD;  Location: [facility];  Service: Neurosurgery;  Laterality: N/A;  Right Lumbar 4-5, Lumbar 5 Sacral 1 Lumbar and thoracic laminectomy   TOTAL HIP ARTHROPLASTY     right; March 2013; left March 2011    SOCIAL HISTORY: Social History   Socioeconomic History   Marital status: Widowed    Spouse name: Not on file   Number of children: 0   Years of education: some college   Highest education level: Not on file  Occupational History   Occupation: Retired   Tobacco Use   Smoking status: Never   Smokeless tobacco: Never  Vaping Use   Vaping Use: Never used  Substance and Sexual Activity   Alcohol use: Yes    Comment: social drinking - a glass of wine about every 2-3 weeka   Drug use: No   Sexual activity: Not Currently    Birth control/protection: Post-menopausal, Abstinence  Other Topics Concern   Not on file  Social History Narrative   Lives alone   Caffeine use: no more than 2 cups per day    16 oz soda per day   Right handed    Social Determinants of Health   Financial Resource Strain: Low Risk  (04/15/2021)   Overall Financial Resource Strain (CARDIA)    Difficulty of Paying Living Expenses: Not hard at all  Food Insecurity: No Food Insecurity (04/15/2021)   Hunger Vital Sign    Worried About Running Out of Food in the Last Year: Never true    Ran Out of Food in the Last Year: Never true  Transportation Needs: No Transportation Needs (04/15/2021)   PRAPARE - Administrator, Civil Service (Medical): No    Lack of Transportation (Non-Medical): No  Physical Activity: Insufficiently Active (04/15/2021)   Exercise Vital Sign    Days of Exercise per Week: 3 days    Minutes of Exercise per Session: 20 min  Stress: No Stress Concern Present (04/15/2021)   [employer] of Occupational Health - Occupational Stress Questionnaire    Feeling of Stress : Not at all  Social Connections: Moderately Integrated (04/15/2021)   Social Connection and Isolation Panel [NHANES]    Frequency of Communication with Friends and Family: More than three times a week    Frequency of Social Gatherings with Friends and Family: Once a week    Attends Religious Services: More than 4 times per year    Active Member of [employer] or Organizations: Yes    Attends Banker Meetings: More than 4 times per year    Marital Status: Widowed  Intimate Partner Violence: Not At Risk (04/15/2021)   Humiliation, Afraid, Rape, and Kick questionnaire     Fear of Current or Ex-Partner: No    Emotionally Abused: No    Physically Abused: No    Sexually Abused: No    FAMILY HISTORY: Family History  Problem Relation Age of Onset   Colon cancer Father 92   Colon cancer Brother 13   Other Mother        heavy smoker; died from arterial sclerosis   Hypertension Sister    Stomach cancer Neg Hx     ALLERGIES:  is allergic to lyrica [pregabalin], mobic [meloxicam], and nsaids.  MEDICATIONS:  Current Outpatient Medications  Medication Sig Dispense Refill   acetaminophen (TYLENOL) 650 MG CR tablet Take 1,300 mg by mouth every 8 (eight) hours as needed for pain.  amitriptyline (ELAVIL) 50 MG tablet Take 1 tablet (50 mg total) by mouth at bedtime. 90 tablet 1   atorvastatin (LIPITOR) 80 MG tablet Take 80 mg by mouth at bedtime.      celecoxib (CELEBREX) 200 MG capsule      Cholecalciferol (DIALYVITE VITAMIN D 5000) 125 MCG (5000 UT) capsule Take 5,000 Units by mouth daily.     COLLAGEN PO Take 1 capsule by mouth daily.     desvenlafaxine (PRISTIQ) 25 MG 24 hr tablet Take 50 mg by mouth daily.     estradiol (ESTRACE) 0.1 MG/GM vaginal cream Use 1 grams every other night 30 g 12   ezetimibe (ZETIA) 10 MG tablet Take 10 mg by mouth at bedtime.      fenofibrate 160 MG tablet      gabapentin (NEURONTIN) 300 MG capsule Take 1 capsule (300 mg total) by mouth 3 (three) times daily. 90 capsule 2   HYDROcodone-acetaminophen (NORCO/VICODIN) 5-325 MG tablet Take 1 tablet by mouth 3 (three) times daily as needed for moderate pain.     LORazepam (ATIVAN) 2 MG tablet Take 2 mg by mouth at bedtime.  2   losartan (COZAAR) 25 MG tablet Take 25 mg by mouth daily.     oxybutynin (DITROPAN) 5 MG tablet Take by mouth.     polyethylene glycol (MIRALAX / GLYCOLAX) 17 g packet Take 17 g by mouth daily. One capful daily in coffee     predniSONE (DELTASONE) 50 MG tablet Take 1 tablet daily with breakfast for 5 days. 5 tablet 0   Psyllium (METAMUCIL PO) Take 1 Dose by  mouth daily. 1 dose = 2 teaspoons     Specialty Vitamins Products (BIOTIN PLUS KERATIN PO) Take 1 capsule by mouth daily.     tiZANidine (ZANAFLEX) 4 MG tablet Take 1 tablet (4 mg total) by mouth every 6 (six) hours as needed for muscle spasms. 20 tablet 0   Turmeric Curcumin 500 MG CAPS Take 500 mg by mouth daily.     No current facility-administered medications for this visit.    REVIEW OF SYSTEMS:   Constitutional: Denies fevers, chills or abnormal night sweats Eyes: Denies blurriness of vision, double vision or watery eyes Ears, nose, mouth, throat, and face: Denies mucositis or sore throat Respiratory: Denies cough, dyspnea or wheezes Cardiovascular: Denies palpitation, chest discomfort or lower extremity swelling Gastrointestinal:  Denies nausea, heartburn or change in bowel habits Skin: Denies abnormal skin rashes.  Bruising on the bilateral forearms and upper thigh. Lymphatics: Denies new lymphadenopathy or easy bruising Neurological:Denies numbness, tingling or new weaknesses Behavioral/Psych: Mood is stable, no new changes  All other systems were reviewed with the patient and are negative.  PHYSICAL EXAMINATION: ECOG PERFORMANCE STATUS: 1 - Symptomatic but completely ambulatory  Vitals:   11/11/21 0911  BP: (!) 177/88  Pulse: 77  Resp: 18  Temp: 97.8 F (36.6 C)  SpO2: 100%   Filed Weights   11/11/21 0911  Weight: 159 lb 11.2 oz (72.4 kg)    GENERAL:alert, no distress and comfortable SKIN: Multiple ecchymosis on bilateral forearms.  Small petechial rash on the left upper thigh. EYES: normal, conjunctiva are pink and non-injected, sclera clear OROPHARYNX:no exudate, no erythema and lips, buccal mucosa, and tongue normal  NECK: supple, thyroid normal size, non-tender, without nodularity LYMPH:  no palpable lymphadenopathy in the cervical, axillary or inguinal LUNGS: clear to auscultation and percussion with normal breathing effort HEART: regular rate & rhythm and  no murmurs and no lower  extremity edema ABDOMEN:abdomen soft, non-tender and normal bowel sounds Musculoskeletal:no cyanosis of digits and no clubbing  PSYCH: alert & oriented x 3 with fluent speech NEURO: no focal motor/sensory deficits  LABORATORY DATA:  I have reviewed the data as listed No results found for this or any previous visit (from the past 2160 hour(s)).  RADIOGRAPHIC STUDIES: I have personally reviewed the radiological images as listed and agreed with the findings in the report. No results found.  ASSESSMENT:  1.  Easy bruising of forearms and upper thighs: - Reports easy bruising on both forearms started in the beginning of 2023.  She is not on NSAIDs or aspirin or any blood thinners. - She has been on Lexapro and duloxetine for several years, tapered over 2 weeks and discontinued on 11/07/2021.  She is started on Pristiq for depression. - She had back surgeries in May 2022 in August 2022 without excessive bleeding. - She denies taking any garlic supplements.  She started taking turmeric for the last 6 months. - She reported 2 miscarriages in the first trimester and one stillbirth.  No prior history of thrombosis. - She has used oral prednisone for 5 days x 2 in the last 2 months for right-sided sciatica.  2.  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  No chemical exposure.  Non-smoker. - Brother died of colon cancer and father died of colon cancer. - No family history of bleeding disorders.   PLAN:  1.  Easy bruising of the forearms: - Lexapro has been tapered off with last dose on 10/30/2021.  SSRIs are known to interfere with platelet function.  She reported that bruising has improved in the last couple of weeks. - We will wait for another 2 to 3 weeks.  If it continues to get better, no further work-up is needed. - If she continues to have bruising, will  check PT, PTT, fibrinogen.  We will also consider checking platelet function analysis (PFA-100). - Due to history of miscarriages, will also check lupus anticoagulant, anticardiolipin antibody and antibeta 2 glycoprotein 1 antibody. - We will see her back 2 weeks after checking blood work.   All questions were answered. The patient knows to call the clinic with any problems, questions or concerns.      Doreatha Massed, MD 11/11/21 9:33 AM

## 2021-11-15 DIAGNOSIS — M4714 Other spondylosis with myelopathy, thoracic region: Secondary | ICD-10-CM | POA: Diagnosis not present

## 2021-11-21 ENCOUNTER — Telehealth: Payer: Self-pay

## 2021-11-21 DIAGNOSIS — R011 Cardiac murmur, unspecified: Secondary | ICD-10-CM

## 2021-11-21 DIAGNOSIS — E7800 Pure hypercholesterolemia, unspecified: Secondary | ICD-10-CM

## 2021-11-21 NOTE — Telephone Encounter (Addendum)
-----   Message from [name], MD sent at 11/18/2021  5:44 PM EDT ----- New patient 9/21 referred for murmur Can get TTE before  Also see if she wants calcium score I think this is [name]'s wife who we cared for a long time    Called patient about test. Patient agreed to have both test. Will place order for them at [facility] since patient lives out that way. Patient verbalized understanding.

## 2021-11-23 ENCOUNTER — Encounter (INDEPENDENT_AMBULATORY_CARE_PROVIDER_SITE_OTHER): Payer: Self-pay | Admitting: Gastroenterology

## 2021-11-23 DIAGNOSIS — F331 Major depressive disorder, recurrent, moderate: Secondary | ICD-10-CM | POA: Diagnosis not present

## 2021-11-23 DIAGNOSIS — F411 Generalized anxiety disorder: Secondary | ICD-10-CM | POA: Diagnosis not present

## 2021-11-23 DIAGNOSIS — R41840 Attention and concentration deficit: Secondary | ICD-10-CM | POA: Diagnosis not present

## 2021-11-24 DIAGNOSIS — R198 Other specified symptoms and signs involving the digestive system and abdomen: Secondary | ICD-10-CM | POA: Diagnosis not present

## 2021-11-28 DIAGNOSIS — F411 Generalized anxiety disorder: Secondary | ICD-10-CM | POA: Diagnosis not present

## 2021-11-28 DIAGNOSIS — F331 Major depressive disorder, recurrent, moderate: Secondary | ICD-10-CM | POA: Diagnosis not present

## 2021-11-28 DIAGNOSIS — R41840 Attention and concentration deficit: Secondary | ICD-10-CM | POA: Diagnosis not present

## 2021-11-29 ENCOUNTER — Ambulatory Visit (HOSPITAL_COMMUNITY)
Admission: RE | Admit: 2021-11-29 | Discharge: 2021-11-29 | Disposition: A | Discharge status: Home / Self Care | Payer: Medicare Other | Source: Ambulatory Visit | Attending: Cardiovascular Disease | Admitting: Cardiovascular Disease

## 2021-11-29 DIAGNOSIS — E7800 Pure hypercholesterolemia, unspecified: Secondary | ICD-10-CM | POA: Diagnosis not present

## 2021-11-29 DIAGNOSIS — R011 Cardiac murmur, unspecified: Secondary | ICD-10-CM | POA: Insufficient documentation

## 2021-11-29 LAB — ECHOCARDIOGRAM COMPLETE
AR max vel: 1.65 cm2
AV Area VTI: 1.63 cm2
AV Area mean vel: 1.72 cm2
AV Mean grad: 13 mmHg
AV Peak grad: 27.7 mmHg
Ao pk vel: 2.63 m/s
Area-P 1/2: 2.6 cm2
S' Lateral: 2 cm

## 2021-11-29 NOTE — Progress Notes (Signed)
*  PRELIMINARY RESULTS* Echocardiogram 2D Echocardiogram has been performed.  Samuel Germany 11/29/2021, 9:22 AM

## 2021-12-01 ENCOUNTER — Ambulatory Visit: Payer: Medicare Other | Admitting: Cardiovascular Disease

## 2021-12-02 DIAGNOSIS — M4726 Other spondylosis with radiculopathy, lumbar region: Secondary | ICD-10-CM | POA: Diagnosis not present

## 2021-12-05 DIAGNOSIS — R41840 Attention and concentration deficit: Secondary | ICD-10-CM | POA: Diagnosis not present

## 2021-12-05 DIAGNOSIS — F331 Major depressive disorder, recurrent, moderate: Secondary | ICD-10-CM | POA: Diagnosis not present

## 2021-12-05 DIAGNOSIS — F411 Generalized anxiety disorder: Secondary | ICD-10-CM | POA: Diagnosis not present

## 2021-12-06 ENCOUNTER — Encounter: Payer: Self-pay | Admitting: Obstetrics and Gynecology

## 2021-12-06 ENCOUNTER — Ambulatory Visit (INDEPENDENT_AMBULATORY_CARE_PROVIDER_SITE_OTHER): Payer: Medicare Other | Admitting: Obstetrics and Gynecology

## 2021-12-06 VITALS — BP 192/83 | HR 89 | Ht 61.0 in | Wt 161.0 lb

## 2021-12-06 DIAGNOSIS — N3941 Urge incontinence: Secondary | ICD-10-CM

## 2021-12-06 DIAGNOSIS — M62838 Other muscle spasm: Secondary | ICD-10-CM | POA: Diagnosis not present

## 2021-12-06 DIAGNOSIS — N393 Stress incontinence (female) (male): Secondary | ICD-10-CM | POA: Diagnosis not present

## 2021-12-06 LAB — POCT URINALYSIS DIPSTICK
Bilirubin, UA: NEGATIVE
Blood, UA: NEGATIVE
Glucose, UA: NEGATIVE
Ketones, UA: NEGATIVE
Leukocytes, UA: NEGATIVE
Nitrite, UA: NEGATIVE
Protein, UA: NEGATIVE
Spec Grav, UA: 1.01 (ref 1.010–1.025)
Urobilinogen, UA: 0.2 E.U./dL
pH, UA: 5.5 (ref 5.0–8.0)

## 2021-12-06 NOTE — Patient Instructions (Signed)
Take tizanidine nightly for two weeks then as needed after.   Referral has been placed to pelvic PT- call them for an appointment.

## 2021-12-06 NOTE — Progress Notes (Signed)
[facility] New Patient Evaluation and Consultation  Referring Provider: Celene Squibb, MD PCP: Celene Squibb, MD Date of Service: 12/06/2021  SUBJECTIVE Chief Complaint: New Patient (Initial Visit) Cynthia Cochran is a 71 y.o. female here for a prolapse evaluation. /)  History of Present Illness: Cynthia Cochran is a 71 y.o. White or Caucasian female seen presenting for evaluation of possible prolapse.    Review of records significant for: Seen by Michelene Heady and noted to have levator spasm. Sent to pelvic PT.   Urinary Symptoms: Leaks urine with cough/ sneeze. Sometimes has the urge to go and can't make it to the bathroom.  Leaks 1-2 time(s) per day. Pad use: 1 liners/ mini-pads per day.   She is bothered by her UI symptoms. She is taking oxybutynin- has been on it for a few years.   Day time voids 3-4.  Nocturia: 0 times per night to void. Voiding dysfunction: she empties her bladder well.  does not use a catheter to empty bladder.  When urinating, she feels she has no difficulties   UTIs:  0  UTI's in the last year.   Denies history of blood in urine and kidney or bladder stones  Pelvic Organ Prolapse Symptoms:                  She Admits to a feeling of a bulge the vaginal area. It has been present for 2 years.  She Denies seeing a bulge.  This bulge is bothersome. Feels like she has been sitting on a tennis bal. Was told by her gynecologist that she did not have prolapse.   Bowel Symptom: Bowel movements: 2-3 time(s) per day Stool consistency: soft  Straining: no.  Splinting: no.  Incomplete evacuation: yes.  She Denies accidental bowel leakage / fecal incontinence Bowel regimen: miralax Had anorectal manometry with her GI and reports nothing was found  Sexual Function Sexually active: no.  Pain with sex: Has pain in rectum and vaginal walls  Pelvic Pain Admits to pelvic pain Location: lower part of the pelvis Pain occurs: constantly Prior pain  treatment: PT floor exercises Improved by: nothing Worsened by: sitting Did pelvic PT with [facility] for three months. Had to have an epidural because her sciatic flared. Had to do a lot of the exercises at home.  She has taken muscle relaxants in the past but not currently.   Past Medical History:  Past Medical History:  Diagnosis Date   Anxiety    Arthritis    Blood transfusion without reported diagnosis 1982   after c section   Dysphagia 04/25/2016   Gastric ulcer    GERD (gastroesophageal reflux disease)    Heart murmur    High cholesterol 04/25/2016   Hyperlipidemia    Mixed stress and urge urinary incontinence 09/09/2015   Sciatic nerve pain    Spinal stenosis      Past Surgical History:   Past Surgical History:  Procedure Laterality Date   BIOPSY  05/12/2016   Procedure: BIOPSY;  Surgeon: Rogene Houston, MD;  [facility];  Service: Endoscopy;;  gastric   BIOPSY  10/04/2018   Procedure: BIOPSY;  Surgeon: Rogene Houston, MD;  [facility];  Service: Endoscopy;;  gastric    CARPAL TUNNEL RELEASE     right; 1998, left [place]   COLONOSCOPY WITH PROPOFOL N/A 10/04/2018   Procedure: COLONOSCOPY WITH PROPOFOL;  Surgeon: Rogene Houston,  MD;  [facility];  Service: Endoscopy;  Laterality: N/A;  7:30   Colonscopy     ESOPHAGEAL DILATION N/A 05/12/2016   Procedure: ESOPHAGEAL DILATION;  Surgeon: Malissa Hippo, MD;  [facility];  Service: Endoscopy;  Laterality: N/A;   ESOPHAGOGASTRODUODENOSCOPY N/A 05/12/2016   Procedure: ESOPHAGOGASTRODUODENOSCOPY (EGD);  Surgeon: Malissa Hippo, MD;  [facility];  Service: Endoscopy;  Laterality: N/A;  9:30   ESOPHAGOGASTRODUODENOSCOPY (EGD) WITH PROPOFOL N/A 10/04/2018   Procedure: ESOPHAGOGASTRODUODENOSCOPY (EGD) WITH PROPOFOL;  Surgeon: Malissa Hippo, MD;  [facility];  Service: Endoscopy;  Laterality: N/A;   EYE SURGERY     lasix   FLEXIBLE  SIGMOIDOSCOPY N/A 01/28/2021   Procedure: FLEXIBLE SIGMOIDOSCOPY WITH PROPOFOL;  Surgeon: Malissa Hippo, MD;  [facility];  Service: Endoscopy;  Laterality: N/A;  9:30   JOINT REPLACEMENT     both hips   LUMBAR LAMINECTOMY/DECOMPRESSION MICRODISCECTOMY N/A 08/05/2020   Procedure: LAMINECTOMY THORACIC TEN-ELEVEN;  Surgeon: Coletta Memos, MD;  Location: [facility];  Service: Neurosurgery;  Laterality: N/A;   LUMBAR LAMINECTOMY/DECOMPRESSION MICRODISCECTOMY N/A 10/14/2020   Procedure: Right Lumbar 4-5, Lumbar 5 Sacral 1 Lumbar and thoracic laminectomy;  Surgeon: Coletta Memos, MD;  Location: [facility];  Service: Neurosurgery;  Laterality: N/A;  Right Lumbar 4-5, Lumbar 5 Sacral 1 Lumbar and thoracic laminectomy   TOTAL HIP ARTHROPLASTY     right; March 2013; left March 2011     Past OB/GYN History: OB History  Gravida Para Term Preterm AB Living  3 1   1 2  0  SAB IAB Ectopic Multiple Live Births  2            # Outcome Date GA Lbr Len/2nd Weight Sex Delivery Anes PTL Lv  3 SAB           2 SAB           1 Preterm             Menopausal: Yes, at age 16, Denies vaginal bleeding since menopause Any history of abnormal pap smears: no.   Medications: She has a current medication list which includes the following prescription(s): acetaminophen, amitriptyline, atorvastatin, dialyvite vitamin d 5000, collagen, desvenlafaxine, ezetimibe, fenofibrate, gabapentin, hydrocodone-acetaminophen, lorazepam, losartan, oxybutynin, polyethylene glycol, prednisone, psyllium, specialty vitamins products, tizanidine, and turmeric curcumin.   Allergies: Patient is allergic to lyrica [pregabalin], mobic [meloxicam], and nsaids.   Social History:  Social History   Tobacco Use   Smoking status: Never   Smokeless tobacco: Never  Vaping Use   Vaping Use: Never used  Substance Use Topics   Alcohol use: Yes    Comment: social drinking - a glass of wine about every 2-3 weeka   Drug use: No     Relationship status: widowed She lives alone.   She is not employed. Regular exercise: No History of abuse: No  Family History:   Family History  Problem Relation Age of Onset   Colon cancer Father 4   Colon cancer Brother 51   Other Mother        heavy smoker; died from arterial sclerosis   Hypertension Sister    Stomach cancer Neg Hx      Review of Systems: Review of Systems  Constitutional:  Negative for fever, malaise/fatigue and weight loss.  Respiratory:  Negative for cough, shortness of breath and wheezing.   Cardiovascular:  Negative for chest pain, palpitations and leg swelling.  Gastrointestinal:  Negative  for abdominal pain and blood in stool.  Genitourinary:  Negative for dysuria.  Musculoskeletal:  Positive for myalgias.  Skin:  Negative for rash.  Neurological:  Negative for dizziness and headaches.  Endo/Heme/Allergies:  Does not bruise/bleed easily.  Psychiatric/Behavioral:  Positive for depression. The patient is not nervous/anxious.      OBJECTIVE Physical Exam: Vitals:   12/06/21 0842  BP: (!) 192/83  Pulse: 89  Weight: 161 lb (73 kg)  Height: 5\' 1"  (1.549 m)    Physical Exam Constitutional:      General: She is not in acute distress. Pulmonary:     Effort: Pulmonary effort is normal.  Abdominal:     General: There is no distension.     Palpations: Abdomen is soft.     Tenderness: There is no abdominal tenderness. There is no rebound.  Musculoskeletal:        General: No swelling. Normal range of motion.  Skin:    General: Skin is warm and dry.     Findings: No rash.  Neurological:     Mental Status: She is alert and oriented to person, place, and time.  Psychiatric:        Mood and Affect: Mood normal.        Behavior: Behavior normal.      GU / Detailed Urogynecologic Evaluation:  Pelvic Exam: Normal external female genitalia; Bartholin's and Skene's glands normal in appearance; urethral meatus normal in appearance, no  urethral masses or discharge.   CST: positive  Speculum exam reveals normal vaginal mucosa with atrophy. Cervix normal appearance. Uterus normal single, nontender. Adnexa no mass, fullness, tenderness.    Pelvic floor strength I/V, puborectalis III/V external anal sphincter IV/V  Pelvic floor musculature: + tension in puborectalis on rectal exam, On vaginal exam, Right levator tender, Right obturator tender, Left levator tender, Left obturator tender  POP-Q:   POP-Q  -2.5                                            Aa   -2.5                                           Ba  -8                                              C   2.5                                            Gh  4                                            Pb  9  tvl   -3                                            Ap  -3                                            Bp  -9                                              D     Rectal Exam:  Normal sphincter tone, no distal rectocele, enterocoele not present, no rectal masses, noted dyssynergia when asking the patient to bear down- difficulty relaxing.  Post-Void Residual (PVR) by Bladder Scan: In order to evaluate bladder emptying, we discussed obtaining a postvoid residual and she agreed to this procedure.  Procedure: The ultrasound unit was placed on the patient's abdomen in the suprapubic region after the patient had voided. A PVR of 31 ml was obtained by bladder scan.  Laboratory Results: POC urine: negative   ASSESSMENT AND PLAN Ms. Gibbs is a 71 y.o. with:  1. Levator spasm   2. SUI (stress urinary incontinence, female)   3. Urge incontinence    - No sign of prolapse noted today.  - Pelvic floor muscle tension is likely contributing to feeling of pressure around the vagina and rectum. The origin of pelvic floor muscle spasm can be multifactorial, including primary, reactive to a different pain source, trauma, or  even part of a centralized pain syndrome.Treatment options include pelvic floor physical therapy, local (vaginal) or oral  muscle relaxants, pelvic muscle trigger point injections or centrally acting pain medications.   - She has tizanidine at home that she will take. Advised to take nightly for two weeks then as needed after.  - She also wants to start a series of pelvic floor trigger point injections.  - Stop oxybutynin, since not recommended for her age group and it is not helping her much.  - We discussed options for SUI but she would like to focus on pelvic pressure first. We reviewed that her symptoms may also improve with pelvic PT.   Return for trigger point injections  Marguerita Beards, MD

## 2021-12-14 ENCOUNTER — Inpatient Hospital Stay: Payer: Medicare Other | Attending: Hematology

## 2021-12-14 DIAGNOSIS — D696 Thrombocytopenia, unspecified: Secondary | ICD-10-CM

## 2021-12-14 DIAGNOSIS — R233 Spontaneous ecchymoses: Secondary | ICD-10-CM | POA: Insufficient documentation

## 2021-12-14 DIAGNOSIS — Z79899 Other long term (current) drug therapy: Secondary | ICD-10-CM | POA: Insufficient documentation

## 2021-12-14 DIAGNOSIS — D649 Anemia, unspecified: Secondary | ICD-10-CM

## 2021-12-14 LAB — FIBRINOGEN: Fibrinogen: 333 mg/dL (ref 210–475)

## 2021-12-14 LAB — CBC WITH DIFFERENTIAL/PLATELET
Abs Immature Granulocytes: 0.01 10*3/uL (ref 0.00–0.07)
Basophils Absolute: 0 10*3/uL (ref 0.0–0.1)
Basophils Relative: 0 %
Eosinophils Absolute: 0.1 10*3/uL (ref 0.0–0.5)
Eosinophils Relative: 1 %
HCT: 37.8 % (ref 36.0–46.0)
Hemoglobin: 12.2 g/dL (ref 12.0–15.0)
Immature Granulocytes: 0 %
Lymphocytes Relative: 22 %
Lymphs Abs: 1.5 10*3/uL (ref 0.7–4.0)
MCH: 28 pg (ref 26.0–34.0)
MCHC: 32.3 g/dL (ref 30.0–36.0)
MCV: 86.9 fL (ref 80.0–100.0)
Monocytes Absolute: 0.5 10*3/uL (ref 0.1–1.0)
Monocytes Relative: 7 %
Neutro Abs: 4.7 10*3/uL (ref 1.7–7.7)
Neutrophils Relative %: 70 %
Platelets: 307 10*3/uL (ref 150–400)
RBC: 4.35 MIL/uL (ref 3.87–5.11)
RDW: 19.3 % — ABNORMAL HIGH (ref 11.5–15.5)
WBC: 6.7 10*3/uL (ref 4.0–10.5)
nRBC: 0 % (ref 0.0–0.2)

## 2021-12-14 LAB — PROTIME-INR
INR: 1 (ref 0.8–1.2)
Prothrombin Time: 13.1 seconds (ref 11.4–15.2)

## 2021-12-14 LAB — PLATELET FUNCTION ASSAY: Collagen / Epinephrine: 112 seconds (ref 0–193)

## 2021-12-14 LAB — APTT: aPTT: 30 seconds (ref 24–36)

## 2021-12-15 LAB — LUPUS ANTICOAGULANT PANEL
DRVVT: 35.3 s (ref 0.0–47.0)
PTT Lupus Anticoagulant: 36.4 s (ref 0.0–43.5)

## 2021-12-16 LAB — CARDIOLIPIN ANTIBODIES, IGG, IGM, IGA
Anticardiolipin IgA: <9 APL U/mL (ref 0–11)
Anticardiolipin IgG: <9 GPL U/mL (ref 0–14)
Anticardiolipin IgM: 11 MPL U/mL (ref 0–12)

## 2021-12-19 ENCOUNTER — Encounter: Payer: Self-pay | Admitting: Hematology

## 2021-12-19 ENCOUNTER — Inpatient Hospital Stay (HOSPITAL_BASED_OUTPATIENT_CLINIC_OR_DEPARTMENT_OTHER): Payer: Medicare Other | Admitting: Hematology

## 2021-12-19 VITALS — BP 205/94 | HR 82 | Temp 100.5°F | Resp 18 | Wt 160.4 lb

## 2021-12-19 DIAGNOSIS — D649 Anemia, unspecified: Secondary | ICD-10-CM | POA: Diagnosis not present

## 2021-12-19 DIAGNOSIS — J029 Acute pharyngitis, unspecified: Secondary | ICD-10-CM | POA: Diagnosis not present

## 2021-12-19 DIAGNOSIS — D696 Thrombocytopenia, unspecified: Secondary | ICD-10-CM | POA: Diagnosis not present

## 2021-12-19 DIAGNOSIS — Z79899 Other long term (current) drug therapy: Secondary | ICD-10-CM | POA: Diagnosis not present

## 2021-12-19 DIAGNOSIS — R233 Spontaneous ecchymoses: Secondary | ICD-10-CM | POA: Diagnosis not present

## 2021-12-19 DIAGNOSIS — U071 COVID-19: Secondary | ICD-10-CM | POA: Diagnosis not present

## 2021-12-19 DIAGNOSIS — R0981 Nasal congestion: Secondary | ICD-10-CM | POA: Diagnosis not present

## 2021-12-19 NOTE — Patient Instructions (Addendum)
[place]  Discharge Instructions  You were seen and examined today by Dr. Delton Coombes.  Dr. Delton Coombes discussed your most recent lab work which revealed everything looks good.   Start taking 1/2 pill of Amitriptyline for a few days and then stop it completely.  Follow-up as scheduled in 6 months with labs.    Thank you for choosing [facility] to provide your oncology and hematology care.   To afford each patient quality time with our provider, please arrive at least 15 minutes before your scheduled appointment time. You may need to reschedule your appointment if you arrive late (10 or more minutes). Arriving late affects you and other patients whose appointments are after yours.  Also, if you miss three or more appointments without notifying the office, you may be dismissed from the clinic at the provider's discretion.    Again, thank you for choosing [facility].  Our hope is that these requests will decrease the amount of time that you wait before being seen by our physicians.   If you have a lab appointment with the [facility] please come in thru the Main Entrance and check in at the main information desk.           _____________________________________________________________  Should you have questions after your visit to [facility], please contact our office at [phone] and follow the prompts.  Our office hours are 8:00 a.m. to 4:30 p.m. Monday - Thursday and 8:00 a.m. to 2:30 p.m. Friday.  Please note that voicemails left after 4:00 p.m. may not be returned until the following business day.  We are closed weekends and all major holidays.  You do have access to a nurse 24-7, just call the main number to the clinic [phone] and do not press any options, hold on the line and a nurse will answer the phone.    For prescription refill requests, have your pharmacy contact our office and allow  72 hours.    Masks are optional in the cancer centers. If you would like for your care team to wear a mask while they are taking care of you, please let them know. You may have one support person who is at least 71 years old accompany you for your appointments.

## 2021-12-19 NOTE — Progress Notes (Signed)
[name], [name] [address]   CLINIC:  Medical Oncology/Hematology  PCP:  Celene Squibb, MD [place] [address] [phone]   REASON FOR VISIT:  Follow-up for easy bruising    BRIEF ONCOLOGIC HISTORY:  Oncology History   No history exists.    CANCER STAGING: Cancer Staging  No matching staging information was found for the patient.   INTERVAL HISTORY:  Cynthia Cochran 71 y.o. female seen for follow-up of easy bruising of the forearms.  Reports stable right sciatic pain.  She reports bruising happens intermittently.  Denies any nosebleeds or other bleeding.  She does not have any bruising other than on the forearms bilaterally.    REVIEW OF SYSTEMS:  Review of Systems  Hematological:  Bruises/bleeds easily.  All other systems reviewed and are negative.    PAST MEDICAL/SURGICAL HISTORY:  Past Medical History:  Diagnosis Date   Anxiety    Arthritis    Blood transfusion without reported diagnosis 1982   after c section   Dysphagia 04/25/2016   Gastric ulcer    GERD (gastroesophageal reflux disease)    Heart murmur    High cholesterol 04/25/2016   Hyperlipidemia    Mixed stress and urge urinary incontinence 09/09/2015   Sciatic nerve pain    Spinal stenosis    Past Surgical History:  Procedure Laterality Date   BIOPSY  05/12/2016   Procedure: BIOPSY;  Surgeon: Rogene Houston, MD;  [facility];  Service: Endoscopy;;  gastric   BIOPSY  10/04/2018   Procedure: BIOPSY;  Surgeon: Rogene Houston, MD;  [facility];  Service: Endoscopy;;  gastric    CARPAL TUNNEL RELEASE     right; 1998, left [place]   COLONOSCOPY WITH PROPOFOL N/A 10/04/2018   Procedure: COLONOSCOPY WITH PROPOFOL;  Surgeon: Rogene Houston, MD;  [facility];  Service: Endoscopy;  Laterality: N/A;  7:30   Colonscopy     ESOPHAGEAL DILATION N/A 05/12/2016   Procedure: ESOPHAGEAL  DILATION;  Surgeon: Rogene Houston, MD;  [facility];  Service: Endoscopy;  Laterality: N/A;   ESOPHAGOGASTRODUODENOSCOPY N/A 05/12/2016   Procedure: ESOPHAGOGASTRODUODENOSCOPY (EGD);  Surgeon: Rogene Houston, MD;  [facility];  Service: Endoscopy;  Laterality: N/A;  9:30   ESOPHAGOGASTRODUODENOSCOPY (EGD) WITH PROPOFOL N/A 10/04/2018   Procedure: ESOPHAGOGASTRODUODENOSCOPY (EGD) WITH PROPOFOL;  Surgeon: Rogene Houston, MD;  [facility];  Service: Endoscopy;  Laterality: N/A;   EYE SURGERY     lasix   FLEXIBLE SIGMOIDOSCOPY N/A 01/28/2021   Procedure: FLEXIBLE SIGMOIDOSCOPY WITH PROPOFOL;  Surgeon: Rogene Houston, MD;  [facility];  Service: Endoscopy;  Laterality: N/A;  9:30   JOINT REPLACEMENT     both hips   LUMBAR LAMINECTOMY/DECOMPRESSION MICRODISCECTOMY N/A 08/05/2020   Procedure: LAMINECTOMY THORACIC TEN-ELEVEN;  Surgeon: Ashok Pall, MD;  Location: [facility];  Service: Neurosurgery;  Laterality: N/A;   LUMBAR LAMINECTOMY/DECOMPRESSION MICRODISCECTOMY N/A 10/14/2020   Procedure: Right Lumbar 4-5, Lumbar 5 Sacral 1 Lumbar and thoracic laminectomy;  Surgeon: Ashok Pall, MD;  Location: [facility];  Service: Neurosurgery;  Laterality: N/A;  Right Lumbar 4-5, Lumbar 5 Sacral 1 Lumbar and thoracic laminectomy   TOTAL HIP ARTHROPLASTY     right; March 2013; left March 2011     SOCIAL HISTORY:  Social History   Socioeconomic History   Marital status: Widowed  Spouse name: Not on file   Number of children: 0   Years of education: some college   Highest education level: Not on file  Occupational History   Occupation: Retired  Tobacco Use   Smoking status: Never   Smokeless tobacco: Never  Vaping Use   Vaping Use: Never used  Substance and Sexual Activity   Alcohol use: Yes    Comment: social drinking - a glass of wine about every 2-3 weeka   Drug use: No   Sexual activity: Not Currently    Birth control/protection: Post-menopausal,  Abstinence  Other Topics Concern   Not on file  Social History Narrative   Lives alone   Caffeine use: no more than 2 cups per day    16 oz soda per day   Right handed    Social Determinants of Health   Financial Resource Strain: Low Risk  (04/15/2021)   Overall Financial Resource Strain (CARDIA)    Difficulty of Paying Living Expenses: Not hard at all  Food Insecurity: No Food Insecurity (04/15/2021)   Hunger Vital Sign    Worried About Running Out of Food in the Last Year: Never true    [place] in the Last Year: Never true  Transportation Needs: No Transportation Needs (04/15/2021)   PRAPARE - Hydrologist (Medical): No    Lack of Transportation (Non-Medical): No  Physical Activity: Insufficiently Active (04/15/2021)   Exercise Vital Sign    Days of Exercise per Week: 3 days    Minutes of Exercise per Session: 20 min  Stress: No Stress Concern Present (04/15/2021)   [place]    Feeling of Stress : Not at all  Social Connections: Moderately Integrated (04/15/2021)   Social Connection and Isolation Panel [NHANES]    Frequency of Communication with Friends and Family: More than three times a week    Frequency of Social Gatherings with Friends and Family: Once a week    Attends Religious Services: More than 4 times per year    Active Member of [employer] or Organizations: Yes    Attends Archivist Meetings: More than 4 times per year    Marital Status: Widowed  Intimate Partner Violence: Not At Risk (04/15/2021)   Humiliation, Afraid, Rape, and Kick questionnaire    Fear of Current or Ex-Partner: No    Emotionally Abused: No    Physically Abused: No    Sexually Abused: No    FAMILY HISTORY:  Family History  Problem Relation Age of Onset   Colon cancer Father 90   Colon cancer Brother 64   Other Mother        heavy smoker; died from arterial sclerosis   Hypertension Sister     Stomach cancer Neg Hx     CURRENT MEDICATIONS:  Outpatient Encounter Medications as of 12/19/2021  Medication Sig   acetaminophen (TYLENOL) 650 MG CR tablet Take 1,300 mg by mouth every 8 (eight) hours as needed for pain.   amitriptyline (ELAVIL) 50 MG tablet Take 1 tablet (50 mg total) by mouth at bedtime.   atorvastatin (LIPITOR) 80 MG tablet Take 80 mg by mouth at bedtime.    Cholecalciferol (DIALYVITE VITAMIN D 5000) 125 MCG (5000 UT) capsule Take 5,000 Units by mouth daily.   COLLAGEN PO Take 1 capsule by mouth daily.   desvenlafaxine (PRISTIQ) 25 MG 24 hr tablet Take 50 mg by mouth daily.   ezetimibe (  ZETIA) 10 MG tablet Take 10 mg by mouth at bedtime.    fenofibrate 160 MG tablet    gabapentin (NEURONTIN) 300 MG capsule Take 1 capsule (300 mg total) by mouth 3 (three) times daily.   HYDROcodone-acetaminophen (NORCO/VICODIN) 5-325 MG tablet Take 1 tablet by mouth 3 (three) times daily as needed for moderate pain.   LORazepam (ATIVAN) 2 MG tablet Take 2 mg by mouth at bedtime.   losartan (COZAAR) 25 MG tablet Take 25 mg by mouth daily.   oxybutynin (DITROPAN) 5 MG tablet Take by mouth.   polyethylene glycol (MIRALAX / GLYCOLAX) 17 g packet Take 17 g by mouth daily. One capful daily in coffee   predniSONE (DELTASONE) 50 MG tablet Take 1 tablet daily with breakfast for 5 days.   Psyllium (METAMUCIL PO) Take 1 Dose by mouth daily. 1 dose = 2 teaspoons   Specialty Vitamins Products (BIOTIN PLUS KERATIN PO) Take 1 capsule by mouth daily.   tiZANidine (ZANAFLEX) 4 MG tablet Take 1 tablet (4 mg total) by mouth every 6 (six) hours as needed for muscle spasms.   Turmeric Curcumin 500 MG CAPS Take 500 mg by mouth daily.   No facility-administered encounter medications on file as of 12/19/2021.    ALLERGIES:  Allergies  Allergen Reactions   Lyrica [Pregabalin] Other (See Comments)    Feet numb   Mobic [Meloxicam] Other (See Comments)    Avoid due to a history of ulcers    Nsaids Other  (See Comments)    Avoid due to a history of ulcers      PHYSICAL EXAM:  ECOG Performance status: 1  Vitals:   12/19/21 1503  BP: (!) 205/94  Pulse: 82  Resp: 18  Temp: (!) 100.5 F (38.1 C)  SpO2: 98%   Filed Weights   12/19/21 1503  Weight: 160 lb 6.4 oz (72.8 kg)   Physical Exam Vitals reviewed.  Constitutional:      Appearance: Normal appearance.  Cardiovascular:     Rate and Rhythm: Regular rhythm.     Heart sounds: Normal heart sounds.  Pulmonary:     Effort: Pulmonary effort is normal.     Breath sounds: Normal breath sounds.  Abdominal:     Palpations: Abdomen is soft. There is no mass.  Neurological:     Mental Status: She is alert.  Psychiatric:        Mood and Affect: Mood normal.        Behavior: Behavior normal.      LABORATORY DATA:  I have reviewed the labs as listed.  CBC    Component Value Date/Time   WBC 6.7 12/14/2021 1240   RBC 4.35 12/14/2021 1240   HGB 12.2 12/14/2021 1240   HCT 37.8 12/14/2021 1240   PLT 307 12/14/2021 1240   MCV 86.9 12/14/2021 1240   MCH 28.0 12/14/2021 1240   MCHC 32.3 12/14/2021 1240   RDW 19.3 (H) 12/14/2021 1240   LYMPHSABS 1.5 12/14/2021 1240   MONOABS 0.5 12/14/2021 1240   EOSABS 0.1 12/14/2021 1240   BASOSABS 0.0 12/14/2021 1240      Latest Ref Rng & Units 10/14/2020    1:02 PM 08/04/2020   11:11 AM 07/22/2019   10:44 AM  CMP  Glucose 70 - 99 mg/dL 100  98    BUN 8 - 23 mg/dL 15  9    Creatinine 0.44 - 1.00 mg/dL 0.61  0.65  0.80   Sodium 135 - 145 mmol/L 138  136  Potassium 3.5 - 5.1 mmol/L 3.6  3.7    Chloride 98 - 111 mmol/L 106  106    CO2 22 - 32 mmol/L 22  25    Calcium 8.9 - 10.3 mg/dL 8.8  8.7      DIAGNOSTIC IMAGING:  I have independently reviewed the scans and discussed with the patient.  ASSESSMENT: 1.  Easy bruising of forearms and upper thighs: - Reports easy bruising on both forearms started in the beginning of 2023.  She is not on NSAIDs or aspirin or any blood thinners. -  She has been on Lexapro and duloxetine for several years, tapered over 2 weeks and discontinued on 11/07/2021.  She is started on Pristiq for depression. - She had back surgeries in May 2022 in August 2022 without excessive bleeding. - She denies taking any garlic supplements.  She started taking turmeric for the last 6 months. - She reported 2 miscarriages in the first trimester and one stillbirth.  No prior history of thrombosis. - She has used oral prednisone for 5 days x 2 in the last 2 months for right-sided sciatica.   2.  Social/family history: - She works as a Psychologist, occupational in our cancer center once a week.  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  No chemical exposure.  Non-smoker. - Brother died of colon cancer and father died of colon cancer. - No family history of bleeding disorders.  PLAN:  1.  Easy bruising of the forearms: - Lexapro tapered off with last dose on 10/30/2021. - I have reviewed her medications.  She is taking amitriptyline for neuropathy which is not helping.  I have recommended that she taper it off. - Reviewed CBC which showed normal platelet count.  PT, PTT and fibrinogen were normal.  Platelet function analysis was also normal. - Due to miscarriages we have checked lupus panel.  Lupus anticoagulant and anticardiolipin antibodies were normal. - She has easy bruising only on the upper extremities which is slightly better since Lexapro has been tapered off. - No other increased risk of bleeding.  I think the easy bruising is most likely related to loss of subcutaneous fat.  She is applying Arnica cream. - Recommend follow-up in 6 months with repeat CBC.  If she has any worsening of bruising, she will call us sooner.      Orders placed this encounter:  Orders Placed This Encounter  Procedures   Reese, MD [place] [phone]

## 2021-12-20 ENCOUNTER — Encounter: Payer: Self-pay | Admitting: Obstetrics and Gynecology

## 2021-12-20 ENCOUNTER — Ambulatory Visit (INDEPENDENT_AMBULATORY_CARE_PROVIDER_SITE_OTHER): Payer: Medicare Other | Admitting: Obstetrics and Gynecology

## 2021-12-20 VITALS — BP 188/93 | HR 78

## 2021-12-20 DIAGNOSIS — M62838 Other muscle spasm: Secondary | ICD-10-CM | POA: Diagnosis not present

## 2021-12-20 MED ORDER — BUPIVACAINE HCL 0.25 % IJ SOLN
9.0000 mL | Freq: Once | INTRAMUSCULAR | Status: AC
  Administered 2021-12-20: 9 mL

## 2021-12-20 MED ORDER — TRIAMCINOLONE ACETONIDE 40 MG/ML IJ SUSP
40.0000 mg | Freq: Once | INTRAMUSCULAR | Status: AC
  Administered 2021-12-20: 40 mg via INTRAMUSCULAR

## 2021-12-20 NOTE — Addendum Note (Signed)
Addended by: Jaquita Folds on: 12/20/2021 12:25 PM   Modules accepted: Orders

## 2021-12-20 NOTE — Progress Notes (Signed)
Ms. Cynthia Cochran is a 71 y.o. female who presents for levator trigger point injection.   Vitals:   12/20/21 0802  BP: (!) 188/93  Pulse: 78   She has made an appt to start with pelvic PT.   Indication(s): Levator spasm.   Informed Consent:  The alternatives, risks and benefits of the procedure were explained to the patient. Risks including, but not limited to discomfort, pain, bleeding, infection, injury to nearby structures, inability to perform the procedure, failure of the procedure were discussed.  All questions were answered and the patient elected to proceed.  Procedure:   The patient was positioned in dorsal lithotomy position.  The vaginal tissues were prepped with Hibiclens solution.  An injection of 10cc of a mixture of 90% anesthetic (0.25% Bupivacaine) and 10% 40mg /ml Triamcinolone acetomide (Kenalog) was performed in multiple in the bilateral levator muscles, a total of 6 injection sites. Pressure was held over bleeding areas until good hemostasis was achieved.   The patient tolerated the procedure well with no apparent complications.  Jaquita Folds, MD

## 2021-12-22 ENCOUNTER — Ambulatory Visit: Payer: Medicare Other | Admitting: Gastroenterology

## 2021-12-22 ENCOUNTER — Ambulatory Visit (INDEPENDENT_AMBULATORY_CARE_PROVIDER_SITE_OTHER): Payer: Medicare Other | Admitting: Gastroenterology

## 2021-12-22 DIAGNOSIS — R41840 Attention and concentration deficit: Secondary | ICD-10-CM | POA: Diagnosis not present

## 2021-12-22 DIAGNOSIS — F331 Major depressive disorder, recurrent, moderate: Secondary | ICD-10-CM | POA: Diagnosis not present

## 2021-12-22 DIAGNOSIS — F411 Generalized anxiety disorder: Secondary | ICD-10-CM | POA: Diagnosis not present

## 2021-12-22 NOTE — Progress Notes (Deleted)
GI Office Note    Referring Provider: Benita Stabile, MD Primary Care Physician:  Benita Stabile, MD Primary Gastroenterologist: previously Dr. Karilyn Cota (***)  Date:  12/22/2021  ID:  Cynthia Cochran, DOB 04/22/1950, MRN [id number]   Chief Complaint   No chief complaint on file.   History of Present Illness  Cynthia Cochran is a 71 y.o. female with a history of IBS - constipation, anal fissure, GERD, dysphagia, anxiety, HLD, urinary incontinence*** presenting today for follow up.  Last seen by Dr. Karilyn Cota in April 2023.  Noted to have a prior history of anal fissure in October 2022 that did not respond to therapy.  She had flex sigmoidoscopy in November 2022 with no abnormality noted.  Continue to have constant rectal discomfort when she is sitting on a tennis ball.  She subsequently underwent anorectal manometry in December 2022 and developed discomfort at 200 mL suggesting rectal hypersensitivity without any findings of pelvic floor dyssynergy.  She was started on low-dose amitriptyline which was gradually increased to 50 mg daily but did not have any improvement of symptoms other than improvement with sleeping.  Noted to be seen by Dr. Despina Hidden who felt that she did not have rectocele or other abnormality and suggested trying gabapentin and after 1 week she did not tell any difference.  She denied any constipation and was having a bowel movement every day, at times having incomplete evacuation.  Reported passing stool with urination but denied any melena or BRBPR.  She was noted to have prior nerve conduction studies of her perianal region and lower extremities that were normal.  Sacral MRI ordered and she was advised to increase her Metamucil, continue MiraLAX, continue amitriptyline and follow-up in 6 months.  Sacral MRI April 2023 noting a normal-appearing sacrum, convex right lumbar scoliosis and multilevel level degenerative change or incompletely evaluated.  She was given referral for neurology  given suspected referred pain to her rectum from her spine due to prior back injury.  Patient followed up in May 2023 via MyChart message regarding referral for urogynecology to discuss rectal hernia (rectocele).   She was seen by urogynecology with [facility] in June 2023.  She reported feeling like she was sitting on a ball for over a year with size changes of mass with different positions and difficulty sleeping at night to find a comfortable position.  She noted decreased sensation with bowel movements but denies any fecal incontinence.  On exam she was noted abnormally high resting tone with tenderness of her pelvic floor and no evidence of mass.  It was felt as though her symptoms were likely due to levator spasm and she was given a referral for pelvic floor physical therapy.  She had multiple appointments with PT and completing exercises at home without any relief of her pelvic pain, rectal pain, and vaginal pain.  At her last appointment 11/24/2021 the patient requested to discontinue PT.  She noted that her neurosurgeon stated they would like to do surgery to place an InterStim device but she was not willing to do that.   Today:    Current Outpatient Medications  Medication Sig Dispense Refill   acetaminophen (TYLENOL) 650 MG CR tablet Take 1,300 mg by mouth every 8 (eight) hours as needed for pain.     amitriptyline (ELAVIL) 50 MG tablet Take 1 tablet (50 mg total) by mouth at bedtime. 90 tablet 1   atorvastatin (LIPITOR) 80 MG tablet Take 80 mg by mouth at bedtime.  Cholecalciferol (DIALYVITE VITAMIN D 5000) 125 MCG (5000 UT) capsule Take 5,000 Units by mouth daily.     COLLAGEN PO Take 1 capsule by mouth daily.     desvenlafaxine (PRISTIQ) 25 MG 24 hr tablet Take 50 mg by mouth daily.     ezetimibe (ZETIA) 10 MG tablet Take 10 mg by mouth at bedtime.      fenofibrate 160 MG tablet      gabapentin (NEURONTIN) 300 MG capsule Take 1 capsule (300 mg total) by mouth 3 (three) times daily. 90  capsule 2   HYDROcodone-acetaminophen (NORCO/VICODIN) 5-325 MG tablet Take 1 tablet by mouth 3 (three) times daily as needed for moderate pain.     losartan (COZAAR) 25 MG tablet Take 25 mg by mouth daily.     oxybutynin (DITROPAN) 5 MG tablet Take by mouth.     polyethylene glycol (MIRALAX / GLYCOLAX) 17 g packet Take 17 g by mouth daily. One capful daily in coffee     predniSONE (DELTASONE) 50 MG tablet Take 1 tablet daily with breakfast for 5 days. 5 tablet 0   Psyllium (METAMUCIL PO) Take 1 Dose by mouth daily. 1 dose = 2 teaspoons     Specialty Vitamins Products (BIOTIN PLUS KERATIN PO) Take 1 capsule by mouth daily.     tiZANidine (ZANAFLEX) 4 MG tablet Take 1 tablet (4 mg total) by mouth every 6 (six) hours as needed for muscle spasms. 20 tablet 0   Turmeric Curcumin 500 MG CAPS Take 500 mg by mouth daily.     No current facility-administered medications for this visit.    Past Medical History:  Diagnosis Date   Anxiety    Arthritis    Blood transfusion without reported diagnosis 1982   after c section   Dysphagia 04/25/2016   Gastric ulcer    GERD (gastroesophageal reflux disease)    Heart murmur    High cholesterol 04/25/2016   Hyperlipidemia    Mixed stress and urge urinary incontinence 09/09/2015   Sciatic nerve pain    Spinal stenosis     Past Surgical History:  Procedure Laterality Date   BIOPSY  05/12/2016   Procedure: BIOPSY;  Surgeon: Rogene Houston, MD;  [facility];  Service: Endoscopy;;  gastric   BIOPSY  10/04/2018   Procedure: BIOPSY;  Surgeon: Rogene Houston, MD;  [facility];  Service: Endoscopy;;  gastric    CARPAL TUNNEL RELEASE     right; 1998, left [place]   COLONOSCOPY WITH PROPOFOL N/A 10/04/2018   Procedure: COLONOSCOPY WITH PROPOFOL;  Surgeon: Rogene Houston, MD;  [facility];  Service: Endoscopy;  Laterality: N/A;  7:30   Colonscopy     ESOPHAGEAL DILATION N/A 05/12/2016   Procedure:  ESOPHAGEAL DILATION;  Surgeon: Rogene Houston, MD;  [facility];  Service: Endoscopy;  Laterality: N/A;   ESOPHAGOGASTRODUODENOSCOPY N/A 05/12/2016   Procedure: ESOPHAGOGASTRODUODENOSCOPY (EGD);  Surgeon: Rogene Houston, MD;  [facility];  Service: Endoscopy;  Laterality: N/A;  9:30   ESOPHAGOGASTRODUODENOSCOPY (EGD) WITH PROPOFOL N/A 10/04/2018   Procedure: ESOPHAGOGASTRODUODENOSCOPY (EGD) WITH PROPOFOL;  Surgeon: Rogene Houston, MD;  [facility];  Service: Endoscopy;  Laterality: N/A;   EYE SURGERY     lasix   FLEXIBLE SIGMOIDOSCOPY N/A 01/28/2021   Procedure: FLEXIBLE SIGMOIDOSCOPY WITH PROPOFOL;  Surgeon: Rogene Houston, MD;  [facility];  Service: Endoscopy;  Laterality: N/A;  9:30   JOINT REPLACEMENT     both hips   LUMBAR LAMINECTOMY/DECOMPRESSION MICRODISCECTOMY N/A 08/05/2020   Procedure: LAMINECTOMY THORACIC TEN-ELEVEN;  Surgeon: Ashok Pall, MD;  Location: [place];  Service: Neurosurgery;  Laterality: N/A;   LUMBAR LAMINECTOMY/DECOMPRESSION MICRODISCECTOMY N/A 10/14/2020   Procedure: Right Lumbar 4-5, Lumbar 5 Sacral 1 Lumbar and thoracic laminectomy;  Surgeon: Ashok Pall, MD;  Location: [place];  Service: Neurosurgery;  Laterality: N/A;  Right Lumbar 4-5, Lumbar 5 Sacral 1 Lumbar and thoracic laminectomy   TOTAL HIP ARTHROPLASTY     right; March 2013; left March 2011    Family History  Problem Relation Age of Onset   Colon cancer Father 57   Colon cancer Brother 48   Other Mother        heavy smoker; died from arterial sclerosis   Hypertension Sister    Stomach cancer Neg Hx     Allergies as of 12/22/2021 - Review Complete 12/20/2021  Allergen Reaction Noted   Lyrica [pregabalin] Other (See Comments) 11/04/2018   Mobic [meloxicam] Other (See Comments) 09/25/2018   Nsaids Other (See Comments) 05/24/2018    Social History   Socioeconomic History   Marital status: Widowed    Spouse name: Not on file   Number of  children: 0   Years of education: some college   Highest education level: Not on file  Occupational History   Occupation: Retired  Tobacco Use   Smoking status: Never   Smokeless tobacco: Never  Vaping Use   Vaping Use: Never used  Substance and Sexual Activity   Alcohol use: Yes    Comment: social drinking - a glass of wine about every 2-3 weeka   Drug use: No   Sexual activity: Not Currently    Birth control/protection: Post-menopausal, Abstinence  Other Topics Concern   Not on file  Social History Narrative   Lives alone   Caffeine use: no more than 2 cups per day    16 oz soda per day   Right handed    Social Determinants of Health   Financial Resource Strain: Low Risk  (04/15/2021)   Overall Financial Resource Strain (CARDIA)    Difficulty of Paying Living Expenses: Not hard at all  Food Insecurity: No Food Insecurity (04/15/2021)   Hunger Vital Sign    Worried About Running Out of Food in the Last Year: Never true    [place] in the Last Year: Never true  Transportation Needs: No Transportation Needs (04/15/2021)   PRAPARE - Hydrologist (Medical): No    Lack of Transportation (Non-Medical): No  Physical Activity: Insufficiently Active (04/15/2021)   Exercise Vital Sign    Days of Exercise per Week: 3 days    Minutes of Exercise per Session: 20 min  Stress: No Stress Concern Present (04/15/2021)   [place]    Feeling of Stress : Not at all  Social Connections: Moderately Integrated (04/15/2021)   Social Connection and Isolation Panel [NHANES]    Frequency of Communication with Friends and Family: More than three times a week    Frequency of Social Gatherings with Friends and Family: Once a week    Attends Religious Services: More than 4 times per year    Active Member of [employer] or Organizations: Yes    Attends Archivist Meetings: More than 4 times per year     Marital Status: Widowed  Review of Systems   Gen: Denies fever, chills, anorexia. Denies fatigue, weakness, weight loss.  CV: Denies chest pain, palpitations, syncope, peripheral edema, and claudication. Resp: Denies dyspnea at rest, cough, wheezing, coughing up blood, and pleurisy. GI: See HPI Derm: Denies rash, itching, dry skin Psych: Denies depression, anxiety, memory loss, confusion. No homicidal or suicidal ideation.  Heme: Denies bruising, bleeding, and enlarged lymph nodes.   Physical Exam   There were no vitals taken for this visit.  General:   Alert and oriented. No distress noted. Pleasant and cooperative.  Head:  Normocephalic and atraumatic. Eyes:  Conjuctiva clear without scleral icterus. Mouth:  Oral mucosa pink and moist. Good dentition. No lesions. Lungs:  Clear to auscultation bilaterally. No wheezes, rales, or rhonchi. No distress.  Heart:  S1, S2 present without murmurs appreciated.  Abdomen:  +BS, soft, non-tender and non-distended. No rebound or guarding. No HSM or masses noted. Rectal: *** Msk:  Symmetrical without gross deformities. Normal posture. Extremities:  Without edema. Neurologic:  Alert and  oriented x4 Psych:  Alert and cooperative. Normal mood and affect.   Assessment  Cynthia Cochran is a 71 y.o. female with a history of  IBS - constipation, anal fissure, GERD, dysphagia, anxiety, HLD, urinary incontinence, and rectal pain presenting today for follow up.   Rectal pain/discomfort:   IBS-Constipation:   PLAN   ***     Venetia Night, MSN, FNP-BC, AGACNP-BC [facility]

## 2021-12-23 NOTE — Therapy (Deleted)
OUTPATIENT PHYSICAL THERAPY FEMALE PELVIC EVALUATION   Patient Name: Cynthia Cochran MRN: [id number] DOB:1950-12-05, 71 y.o., female Today's Date: 12/23/2021    Past Medical History:  Diagnosis Date   Anxiety    Arthritis    Blood transfusion without reported diagnosis 1982   after c section   Dysphagia 04/25/2016   Gastric ulcer    GERD (gastroesophageal reflux disease)    Heart murmur    High cholesterol 04/25/2016   Hyperlipidemia    Mixed stress and urge urinary incontinence 09/09/2015   Sciatic nerve pain    Spinal stenosis    Past Surgical History:  Procedure Laterality Date   BIOPSY  05/12/2016   Procedure: BIOPSY;  Surgeon: Malissa Hippo, MD;  [facility];  Service: Endoscopy;;  gastric   BIOPSY  10/04/2018   Procedure: BIOPSY;  Surgeon: Malissa Hippo, MD;  [facility];  Service: Endoscopy;;  gastric    CARPAL TUNNEL RELEASE     right; 1998, left 1998   CESAREAN SECTION  1982   COLONOSCOPY WITH PROPOFOL N/A 10/04/2018   Procedure: COLONOSCOPY WITH PROPOFOL;  Surgeon: Malissa Hippo, MD;  [facility];  Service: Endoscopy;  Laterality: N/A;  7:30   Colonscopy     ESOPHAGEAL DILATION N/A 05/12/2016   Procedure: ESOPHAGEAL DILATION;  Surgeon: Malissa Hippo, MD;  [facility];  Service: Endoscopy;  Laterality: N/A;   ESOPHAGOGASTRODUODENOSCOPY N/A 05/12/2016   Procedure: ESOPHAGOGASTRODUODENOSCOPY (EGD);  Surgeon: Malissa Hippo, MD;  [facility];  Service: Endoscopy;  Laterality: N/A;  9:30   ESOPHAGOGASTRODUODENOSCOPY (EGD) WITH PROPOFOL N/A 10/04/2018   Procedure: ESOPHAGOGASTRODUODENOSCOPY (EGD) WITH PROPOFOL;  Surgeon: Malissa Hippo, MD;  [facility];  Service: Endoscopy;  Laterality: N/A;   EYE SURGERY     lasix   FLEXIBLE SIGMOIDOSCOPY N/A 01/28/2021   Procedure: FLEXIBLE SIGMOIDOSCOPY WITH PROPOFOL;  Surgeon: Malissa Hippo, MD;  [facility];  Service: Endoscopy;  Laterality:  N/A;  9:30   JOINT REPLACEMENT     both hips   LUMBAR LAMINECTOMY/DECOMPRESSION MICRODISCECTOMY N/A 08/05/2020   Procedure: LAMINECTOMY THORACIC TEN-ELEVEN;  Surgeon: Coletta Memos, MD;  [facility];  Service: Neurosurgery;  Laterality: N/A;   LUMBAR LAMINECTOMY/DECOMPRESSION MICRODISCECTOMY N/A 10/14/2020   Procedure: Right Lumbar 4-5, Lumbar 5 Sacral 1 Lumbar and thoracic laminectomy;  Surgeon: Coletta Memos, MD;  Location: [facility];  Service: Neurosurgery;  Laterality: N/A;  Right Lumbar 4-5, Lumbar 5 Sacral 1 Lumbar and thoracic laminectomy   TOTAL HIP ARTHROPLASTY     right; March 2013; left March 2011   Patient Active Problem List   Diagnosis Date Noted   Pelvic relaxation due to cystocele, midline 04/15/2021   Postmenopausal 04/15/2021   Rectal pain 04/05/2021   Anal fissure 12/21/2020   Constipation 08/12/2020   Thoracic spinal stenosis 08/05/2020   Gait disturbance 11/04/2018   Numbness and tingling 11/04/2018   Lumbar radiculopathy 11/04/2018   Cervical stenosis of spinal canal 11/04/2018   PUD (peptic ulcer disease) 05/10/2018   Anemia 05/10/2018   Special screening for malignant neoplasms, colon 05/10/2018   Abdominal pain 04/07/2018   Hypokalemia 04/07/2018   Osteoarthritis 04/07/2018   Elevated blood pressure reading 04/07/2018   Family hx of colon cancer 02/27/2018   Screening for colorectal cancer 11/06/2017   Encounter for well woman exam with routine gynecological exam 11/06/2017   High cholesterol 04/25/2016   Dysphagia 04/25/2016   Esophageal dysphagia 04/25/2016  Mixed stress and urge urinary incontinence 09/09/2015    PCP: Celene Squibb, MD  REFERRING PROVIDER: Jaquita Folds, MD  REFERRING DIAG: [phone] (ICD-10-CM) - Levator spasm  THERAPY DIAG:  No diagnosis found.  Rationale for Evaluation and Treatment Rehabilitation  ONSET DATE: ***  SUBJECTIVE:                                                                                                                                                                                            SUBJECTIVE STATEMENT: *** Fluid intake: {Yes/No:304960894}    PAIN:  Are you having pain? {yes/no:20286} NPRS scale: ***/10 Pain location: {pelvic pain location:27098}  Pain type: {type:313116} Pain description: {PAIN DESCRIPTION:21022940}   Aggravating factors: *** Relieving factors: ***  PRECAUTIONS: {Therapy precautions:24002}  WEIGHT BEARING RESTRICTIONS {Yes ***/No:24003}  FALLS:  Has patient fallen in last 6 months? {fallsyesno:27318}  LIVING ENVIRONMENT: Lives with: {[facility] lives with:25569::"lives with their family"} Lives in: {Lives in:25570} Stairs: {opstairs:27293} Has following equipment at home: {Assistive devices:23999}  OCCUPATION: ***  PLOF: {PLOF:24004}  PATIENT GOALS ***  PERTINENT HISTORY:  C-section; lumbar laminectomy; bil. THR Sexual abuse: {Yes/No:304960894}  BOWEL MOVEMENT Pain with bowel movement: {yes/no:20286} Type of bowel movement:{PT BM type:27100} Fully empty rectum: {Yes/No:304960894} Leakage: {Yes/No:304960894} Pads: {Yes/No:304960894} Fiber supplement: {Yes/No:304960894}  URINATION Pain with urination: {yes/no:20286} Fully empty bladder: Yes: *** Stream: {PT urination:27102} Urgency: {Yes/No:304960894} Frequency: Day time voids 3-4.  Nocturia: 0 times per night to void. Leakage: Urge to void, Walking to the bathroom, Coughing, and Sneezing Pads:  1 liners/ mini-pads per day.  INTERCOURSE Pain with intercourse: {pain with intercourse PA:27099} Ability to have vaginal penetration:  {Yes/No:304960894} Climax: *** Marinoff Scale: ***/3  PREGNANCY Vaginal deliveries *** Tearing {Yes***/No:304960894} C-section deliveries *** Currently pregnant {Yes***/No:304960894}  PROLAPSE None    OBJECTIVE:   DIAGNOSTIC FINDINGS:  CST: positive; Pelvic floor strength I/V, puborectalis III/V external anal sphincter IV/V   Pelvic  floor musculature: + tension in puborectalis on rectal exam, On vaginal exam, Right levator tender, Right obturator tender, Left levator tender, Left obturator tender PVR of 31 ml was obtained by bladder scan. PATIENT SURVEYS:  {rehab surveys:24030}  PFIQ-7 ***  COGNITION:  Overall cognitive status: {cognition:24006}     SENSATION:  Light touch: {intact/deficits:24005}  Proprioception: {intact/deficits:24005}  MUSCLE LENGTH: Hamstrings: Right *** deg; Left *** deg Thomas test: Right *** deg; Left *** deg  LUMBAR SPECIAL TESTS:  {lumbar special test:25242}  FUNCTIONAL TESTS:  {Functional tests:24029}  GAIT: Distance walked: *** Assistive device utilized: {Assistive devices:23999} Level of assistance: {Levels of assistance:24026} Comments: ***  POSTURE: {posture:25561}   PELVIC ALIGNMENT:  LUMBARAROM/PROM  A/PROM A/PROM  eval  Flexion   Extension   Right lateral flexion   Left lateral flexion   Right rotation   Left rotation    (Blank rows = not tested)  LOWER EXTREMITY ROM:  {AROM/PROM:27142} ROM Right eval Left eval  Hip flexion    Hip extension    Hip abduction    Hip adduction    Hip internal rotation    Hip external rotation    Knee flexion    Knee extension    Ankle dorsiflexion    Ankle plantarflexion    Ankle inversion    Ankle eversion     (Blank rows = not tested)  LOWER EXTREMITY MMT:  MMT Right eval Left eval  Hip flexion    Hip extension    Hip abduction    Hip adduction    Hip internal rotation    Hip external rotation    Knee flexion    Knee extension    Ankle dorsiflexion    Ankle plantarflexion    Ankle inversion    Ankle eversion      PALPATION:   General  ***                External Perineal Exam ***                             Internal Pelvic Floor ***  Patient confirms identification and approves PT to assess internal pelvic floor and treatment {yes/no:20286}  PELVIC MMT:   MMT eval  Vaginal    Internal Anal Sphincter   External Anal Sphincter   Puborectalis   Diastasis Recti   (Blank rows = not tested)        TONE: ***  PROLAPSE: ***  TODAY'S TREATMENT  EVAL ***   PATIENT EDUCATION:  Education details: *** Person educated: {Person educated:25204} Education method: {Education Method:25205} Education comprehension: {Education Comprehension:25206}   HOME EXERCISE PROGRAM: ***  ASSESSMENT:  CLINICAL IMPRESSION: Patient is a *** y.o. *** who was seen today for physical therapy evaluation and treatment for ***.    OBJECTIVE IMPAIRMENTS {opptimpairments:25111}.   ACTIVITY LIMITATIONS {activitylimitations:27494}  PARTICIPATION LIMITATIONS: {participationrestrictions:25113}  PERSONAL FACTORS {Personal factors:25162} are also affecting patient's functional outcome.   REHAB POTENTIAL: {rehabpotential:25112}  CLINICAL DECISION MAKING: {clinical decision making:25114}  EVALUATION COMPLEXITY: {Evaluation complexity:25115}   GOALS: Goals reviewed with patient? {yes/no:20286}  SHORT TERM GOALS: Target date: {follow up:25551}  *** Baseline: Goal status: {GOALSTATUS:25110}  2.  *** Baseline:  Goal status: {GOALSTATUS:25110}  3.  *** Baseline:  Goal status: {GOALSTATUS:25110}  4.  *** Baseline:  Goal status: {GOALSTATUS:25110}  5.  *** Baseline:  Goal status: {GOALSTATUS:25110}  6.  *** Baseline:  Goal status: {GOALSTATUS:25110}  LONG TERM GOALS: Target date: {follow up:25551}   *** Baseline:  Goal status: {GOALSTATUS:25110}  2.  *** Baseline:  Goal status: {GOALSTATUS:25110}  3.  *** Baseline:  Goal status: {GOALSTATUS:25110}  4.  *** Baseline:  Goal status: {GOALSTATUS:25110}  5.  *** Baseline:  Goal status: {GOALSTATUS:25110}  6.  *** Baseline:  Goal status: {GOALSTATUS:25110}  PLAN: PT FREQUENCY: {rehab frequency:25116}  PT DURATION: {rehab duration:25117}  PLANNED INTERVENTIONS: {rehab planned  interventions:25118::"Therapeutic exercises","Therapeutic activity","Neuromuscular re-education","Balance training","Gait training","Patient/Family education","Self Care","Joint mobilization"}  PLAN FOR NEXT SESSION: ***   Fatmata Legere, PT 12/23/2021, 9:27 AM

## 2021-12-26 ENCOUNTER — Ambulatory Visit: Payer: Medicare Other | Attending: Obstetrics and Gynecology | Admitting: Physical Therapy

## 2021-12-26 ENCOUNTER — Encounter: Payer: Self-pay | Admitting: Physical Therapy

## 2021-12-26 ENCOUNTER — Other Ambulatory Visit: Payer: Self-pay

## 2021-12-26 ENCOUNTER — Ambulatory Visit: Payer: Medicare Other | Admitting: Physical Therapy

## 2021-12-26 DIAGNOSIS — R278 Other lack of coordination: Secondary | ICD-10-CM | POA: Diagnosis not present

## 2021-12-26 DIAGNOSIS — M5416 Radiculopathy, lumbar region: Secondary | ICD-10-CM | POA: Diagnosis not present

## 2021-12-26 DIAGNOSIS — R252 Cramp and spasm: Secondary | ICD-10-CM | POA: Insufficient documentation

## 2021-12-26 NOTE — Therapy (Signed)
OUTPATIENT PHYSICAL THERAPY FEMALE PELVIC EVALUATION   Patient Name: Cynthia Cochran MRN: [id number] DOB:1950-05-21, 71 y.o., female Today's Date: 12/26/2021   PT End of Session - 12/26/21 1509     Visit Number 1    Date for PT Re-Evaluation 03/20/22    Authorization Type Medicare    Authorization - Visit Number 1    Authorization - Number of Visits 10    PT Start Time 5284    PT Stop Time 1600    PT Time Calculation (min) 45 min    Activity Tolerance Patient tolerated treatment well    Behavior During Therapy [facility] for tasks assessed/performed             Past Medical History:  Diagnosis Date   Anxiety    Arthritis    Blood transfusion without reported diagnosis 1982   after c section   Dysphagia 04/25/2016   Gastric ulcer    GERD (gastroesophageal reflux disease)    Heart murmur    High cholesterol 04/25/2016   Hyperlipidemia    Mixed stress and urge urinary incontinence 09/09/2015   Sciatic nerve pain    Spinal stenosis    Past Surgical History:  Procedure Laterality Date   BIOPSY  05/12/2016   Procedure: BIOPSY;  Surgeon: Rogene Houston, MD;  [facility];  Service: Endoscopy;;  gastric   BIOPSY  10/04/2018   Procedure: BIOPSY;  Surgeon: Rogene Houston, MD;  [facility];  Service: Endoscopy;;  gastric    CARPAL TUNNEL RELEASE     right; 1998, left [place]   COLONOSCOPY WITH PROPOFOL N/A 10/04/2018   Procedure: COLONOSCOPY WITH PROPOFOL;  Surgeon: Rogene Houston, MD;  [facility];  Service: Endoscopy;  Laterality: N/A;  7:30   Colonscopy     ESOPHAGEAL DILATION N/A 05/12/2016   Procedure: ESOPHAGEAL DILATION;  Surgeon: Rogene Houston, MD;  [facility];  Service: Endoscopy;  Laterality: N/A;   ESOPHAGOGASTRODUODENOSCOPY N/A 05/12/2016   Procedure: ESOPHAGOGASTRODUODENOSCOPY (EGD);  Surgeon: Rogene Houston, MD;  [facility];  Service: Endoscopy;  Laterality: N/A;  9:30    ESOPHAGOGASTRODUODENOSCOPY (EGD) WITH PROPOFOL N/A 10/04/2018   Procedure: ESOPHAGOGASTRODUODENOSCOPY (EGD) WITH PROPOFOL;  Surgeon: Rogene Houston, MD;  [facility];  Service: Endoscopy;  Laterality: N/A;   EYE SURGERY     lasix   FLEXIBLE SIGMOIDOSCOPY N/A 01/28/2021   Procedure: FLEXIBLE SIGMOIDOSCOPY WITH PROPOFOL;  Surgeon: Rogene Houston, MD;  [facility];  Service: Endoscopy;  Laterality: N/A;  9:30   JOINT REPLACEMENT     both hips   LUMBAR LAMINECTOMY/DECOMPRESSION MICRODISCECTOMY N/A 08/05/2020   Procedure: LAMINECTOMY THORACIC TEN-ELEVEN;  Surgeon: Ashok Pall, MD;  Location: [place];  Service: Neurosurgery;  Laterality: N/A;   LUMBAR LAMINECTOMY/DECOMPRESSION MICRODISCECTOMY N/A 10/14/2020   Procedure: Right Lumbar 4-5, Lumbar 5 Sacral 1 Lumbar and thoracic laminectomy;  Surgeon: Ashok Pall, MD;  Location: [place];  Service: Neurosurgery;  Laterality: N/A;  Right Lumbar 4-5, Lumbar 5 Sacral 1 Lumbar and thoracic laminectomy   TOTAL HIP ARTHROPLASTY     right; March 2013; left March 2011   Patient Active Problem List   Diagnosis Date Noted   Pelvic relaxation due to cystocele, midline 04/15/2021   Postmenopausal 04/15/2021   Rectal pain 04/05/2021   Anal fissure 12/21/2020   Constipation 08/12/2020   Thoracic spinal stenosis 08/05/2020   Gait disturbance 11/04/2018   Numbness  and tingling 11/04/2018   Lumbar radiculopathy 11/04/2018   Cervical stenosis of spinal canal 11/04/2018   PUD (peptic ulcer disease) 05/10/2018   Anemia 05/10/2018   Special screening for malignant neoplasms, colon 05/10/2018   Abdominal pain 04/07/2018   Hypokalemia 04/07/2018   Osteoarthritis 04/07/2018   Elevated blood pressure reading 04/07/2018   Family hx of colon cancer 02/27/2018   Screening for colorectal cancer 11/06/2017   Encounter for well woman exam with routine gynecological exam 11/06/2017   High cholesterol 04/25/2016   Dysphagia 04/25/2016   Esophageal  dysphagia 04/25/2016   Mixed stress and urge urinary incontinence 09/09/2015    PCP: Benita Stabile, MD  REFERRING PROVIDER: Marguerita Beards, MD  REFERRING DIAG: [phone] (ICD-10-CM) - Levator spasm  THERAPY DIAG:  No diagnosis found.  Rationale for Evaluation and Treatment Rehabilitation  ONSET DATE: 2021  SUBJECTIVE:                                                                                                                                                                                           SUBJECTIVE STATEMENT: Patient issues started 2 years ago. Patient had PT for 3 months  for pelvic floor. I had 1 injection so far. I had an epidural. She feels like she has rubber bands around her butt hole. I have a vaginal wand to work on the muscles. The rectum feels hard and tight. Patient has bowel movements and they start part way.  Fluid intake: Yes: drink a lot of water.      PAIN:  Are you having pain? No  PRECAUTIONS: None  WEIGHT BEARING RESTRICTIONS No  FALLS:  Has patient fallen in last 6 months? No  LIVING ENVIRONMENT: Lives with: lives with their family   OCCUPATION: retired; no exercise  PLOF: Independent  PATIENT GOALS rectum to feel normal  PERTINENT HISTORY:  C-section; bil. Hips replaced; lumbar laminectomy;   BOWEL MOVEMENT Pain with bowel movement: No Type of bowel movement:Type (Bristol Stool Scale) type 4 at 3 inches, Frequency daily for several times, Strain No, and Splinting no Fully empty rectum: No, uses the squatty potty Leakage: No Fiber supplement: Yes: miralax, metamucil  URINATION Pain with urination: No Fully empty bladder: Yes:   Stream: Strong Urgency: No Frequency: Day time voids 3-4.  Nocturia: 0 times per night to void Leakage: Urge to void, Walking to the bathroom, Coughing, and Sneezing, not leakage for the past week Pads: 1 liners/ mini-pads per day  INTERCOURSE not sexually active  PREGNANCY C-section  deliveries 1 still born      OBJECTIVE:   DIAGNOSTIC FINDINGS:  Pelvic floor strength  I/V, puborectalis III/V external anal sphincter IV/V  Pelvic floor musculature: + tension in puborectalis on rectal exam, On vaginal exam, Right levator tender, Right obturator tender, Left levator tender, Left obturator tender PVR of 31 ml was obtained by bladder scan   COGNITION:  Overall cognitive status: Within functional limits for tasks assessed     SENSATION:  Light touch: Appears intact  Proprioception: Appears intact                 POSTURE: No Significant postural limitations   PELVIC ALIGNMENT: Bilateral ASIS are equal  LUMBARAROM/PROM not assessed due to having an epidural this morning.    LOWER EXTREMITY ROM: Within functional limits   LOWER EXTREMITY MMT: bilateral hip strength is 4/5    PALPATION:   General  Patient over contracts the upper abdominals and does not contract the lower abdominals.                 External Perineal Exam vaginal dryness, tightness along the perineal body and superior transverse                             Internal Pelvic Floor tenderness located in the left puborectalis, bilateral obturator internist; tightness in bilateral iliococcygeus, puborectalis,   Patient confirms identification and approves PT to assess internal pelvic floor and treatment Yes  PELVIC MMT:   MMT eval  Vaginal 2/5 anterior/posterior; 1/5 laterally  Internal Anal Sphincter 3/5  External Anal Sphincter 3/5  Puborectalis 2/5  (Blank rows = not tested)        TONE: increased  PROLAPSE: none  TODAY'S TREATMENT  EVAL Date: 12/26/2021 HEP established-see below     PATIENT EDUCATION:  Education details: educated patient on how to use the vaginal wand through the anus and vaginally to work the puborectalis, obturator internist Person educated: Patient Education method: Explanation Education comprehension: verbalized understanding   HOME EXERCISE  PROGRAM: See above.   ASSESSMENT:  CLINICAL IMPRESSION: Patient is a 72 y.o. female who was seen today for physical therapy evaluation and treatment for levator spasm. Patient does not have pelvic floor pain but feels the rectum is a tight band and she is sitting on a ball. Vaginal strength is 1/5 laterally and anterior and posterior is 2/5. Her rectal strength is 3/5 with puborectalis is 2/5. She has tightness in the anococcygeal ligament, puborectalis, obturator internist, iliococcygeal ligament. She had some stool in the rectum. Patient feels she only gets half of the stool out then the rest is stuck. She will have a bowel movement multiple times per day type 4 that is 3 inches. Patient leaks urine with urge to void, walking to the bathroom, coughing, and sneezing. She has  not leaked urine  for the past week. Patient will benefit from skilled therapy to improve pelvic floor coordination to relax the pelvic floor and have a full bowel movement and reduce urinary leakage.    OBJECTIVE IMPAIRMENTS decreased coordination, decreased endurance, increased fascial restrictions, increased muscle spasms, and impaired tone.   ACTIVITY LIMITATIONS toileting  PARTICIPATION LIMITATIONS: community activity  PERSONAL FACTORS Fitness and 3+ comorbidities: C-section; bil. Hips replaced; lumbar laminectomy  are also affecting patient's functional outcome.   REHAB POTENTIAL: Excellent  CLINICAL DECISION MAKING: Stable/uncomplicated  EVALUATION COMPLEXITY: Low   GOALS: Goals reviewed with patient? Yes  SHORT TERM GOALS: Target date: 01/23/2022  Patient independent with the use of the pelvic wand to work on the pelvic  floor tissue through the anus and vagina.  Baseline: Goal status: INITIAL  2.  Patient able to perform diaphragmatic breathing in sitting to relax the pelvic floor.  Baseline:  Goal status: INITIAL  3.  Patient reports the tightness in the pelvic floor has reduced >/= 25% due to  reduction of the trigger points.  Baseline:  Goal status: INITIAL    LONG TERM GOALS: Target date: 03/20/2022   Patient is independent with her HEP to relax the pelvic floor and strengthen.  Baseline:  Goal status: INITIAL  2.  Patient reports no urinary leakage due to pelvic floor strength is >/= 3/5.  Baseline:  Goal status: INITIAL  3.  Patient is able to push the full stool our and not feel she has some remaining due to the ability to fully relax her pelvic floor.  Baseline:  Goal status: INITIAL  4.  Patient is able to sit without feeling a band or ball in the anal region due to reduction of tightness in the pelvic floor  Baseline:  Goal status: INITIAL   PLAN: PT FREQUENCY: 1x/week  PT DURATION: 12 weeks  PLANNED INTERVENTIONS: Therapeutic exercises, Therapeutic activity, Neuromuscular re-education, Patient/Family education, Self Care, Joint mobilization, Dry Needling, Electrical stimulation, Spinal mobilization, Cryotherapy, Moist heat, Biofeedback, and Manual therapy  PLAN FOR NEXT SESSION: manual work to the pelvic floor vaginally and anally, diaphragmatic breathing to do pelvic floor drop, see how it is going with the pelvic wand, hip stretches for the pelvic floor   Eulis Foster, PT 12/26/21 5:14 PM

## 2021-12-27 ENCOUNTER — Ambulatory Visit (INDEPENDENT_AMBULATORY_CARE_PROVIDER_SITE_OTHER): Payer: Medicare Other | Admitting: Obstetrics and Gynecology

## 2021-12-27 ENCOUNTER — Encounter: Payer: Self-pay | Admitting: Obstetrics and Gynecology

## 2021-12-27 VITALS — BP 152/96 | Ht 61.0 in | Wt 149.0 lb

## 2021-12-27 DIAGNOSIS — M62838 Other muscle spasm: Secondary | ICD-10-CM

## 2021-12-27 NOTE — Progress Notes (Signed)
Ms. Cynthia Cochran is a 71 y.o. female who presents for levator trigger point injection. Series #2  Vitals:   12/27/21 0953  BP: (!) 152/96   She stated pelvic PT yesterday and had epidural injection yesterday as well.   Indication(s): Levator spasm.   Informed Consent:  The alternatives, risks and benefits of the procedure were explained to the patient. Risks including, but not limited to discomfort, pain, bleeding, infection, injury to nearby structures, inability to perform the procedure, failure of the procedure were discussed.  All questions were answered and the patient elected to proceed.  Procedure:   The patient was positioned in dorsal lithotomy position.  The vaginal tissues were prepped with Hibiclens solution.  An injection of 10cc of a mixture of 90% anesthetic (0.25% Bupivacaine) and 10% 40mg /ml Triamcinolone acetomide (Kenalog) was performed in multiple in the bilateral levator muscles, a total of 5 injection sites. Pressure was held over bleeding areas until good hemostasis was achieved.   The patient tolerated the procedure well with no apparent complications. Return 1 week for repeat injection.   Jaquita Folds, MD

## 2021-12-28 ENCOUNTER — Other Ambulatory Visit (HOSPITAL_BASED_OUTPATIENT_CLINIC_OR_DEPARTMENT_OTHER): Payer: Medicare Other

## 2021-12-28 ENCOUNTER — Ambulatory Visit (HOSPITAL_COMMUNITY)
Admission: RE | Admit: 2021-12-28 | Discharge: 2021-12-28 | Disposition: A | Discharge status: Home / Self Care | Payer: Medicare Other | Source: Ambulatory Visit | Attending: Cardiovascular Disease | Admitting: Cardiovascular Disease

## 2021-12-28 DIAGNOSIS — E7800 Pure hypercholesterolemia, unspecified: Secondary | ICD-10-CM | POA: Insufficient documentation

## 2021-12-28 DIAGNOSIS — R011 Cardiac murmur, unspecified: Secondary | ICD-10-CM | POA: Insufficient documentation

## 2021-12-29 NOTE — Progress Notes (Signed)
CARDIOLOGY CONSULT NOTE       Patient ID: Cynthia Cochran MRN: [id number] DOB/AGE: 71-Apr-1952 71 y.o.  Admit date: (Not on file) Referring Physician: Nevada Crane Primary Physician: Celene Squibb, MD Primary Cardiologist: new Reason for Consultation: Murmur  Active Problems:   * No active hospital problems. *   HPI:  71 y.o. referred by Dr Nevada Crane for murmur. I use to take care of her husband who passed a few years ago. Transition to being single has been rough She was on Lexapro and Prosac but weaned and now on Pristiq for depression TTE done 11/29/21 showed normal EF and just mild AS/AR with mean gradient 13 peak 27.7 mmHg DVI 0.58 and AVA 1.7 cm2 Calcium score was 257 , 83 rd percentile for age/sex. She is on statin Don't have recent lipids Seen by oncology for easy bruising She has had some prednisone for sciatica Labs and blood counts normal only on observation for now  She retired from customer service at [employer] Volunteers at [facility] in [place] other systems reviewed and negative except as noted above  Past Medical History:  Diagnosis Date   Anxiety    Arthritis    Blood transfusion without reported diagnosis 1982   after c section   Dysphagia 04/25/2016   Gastric ulcer    GERD (gastroesophageal reflux disease)    Heart murmur    High cholesterol 04/25/2016   Hyperlipidemia    Mixed stress and urge urinary incontinence 09/09/2015   Sciatic nerve pain    Spinal stenosis     Family History  Problem Relation Age of Onset   Colon cancer Father 52   Colon cancer Brother 58   Other Mother        heavy smoker; died from arterial sclerosis   Hypertension Sister    Stomach cancer Neg Hx     Social History   Socioeconomic History   Marital status: Widowed    Spouse name: Not on file   Number of children: 0   Years of education: some college   Highest education level: Not on file  Occupational History   Occupation: Retired  Tobacco Use   Smoking status: Never    Smokeless tobacco: Never  Vaping Use   Vaping Use: Never used  Substance and Sexual Activity   Alcohol use: Yes    Comment: social drinking - a glass of wine about every 2-3 weeka   Drug use: No   Sexual activity: Not Currently    Birth control/protection: Post-menopausal, Abstinence  Other Topics Concern   Not on file  Social History Narrative   Lives alone   Caffeine use: no more than 2 cups per day    16 oz soda per day   Right handed    Social Determinants of Health   Financial Resource Strain: Low Risk  (04/15/2021)   Overall Financial Resource Strain (CARDIA)    Difficulty of Paying Living Expenses: Not hard at all  Food Insecurity: No Food Insecurity (04/15/2021)   Hunger Vital Sign    Worried About Running Out of Food in the Last Year: Never true    Libby in the Last Year: Never true  Transportation Needs: No Transportation Needs (04/15/2021)   PRAPARE - Hydrologist (Medical): No    Lack of Transportation (Non-Medical): No  Physical Activity: Insufficiently Active (04/15/2021)   Exercise Vital Sign    Days of Exercise per Week: 3  days    Minutes of Exercise per Session: 20 min  Stress: No Stress Concern Present (04/15/2021)   [employer] of Occupational Health - Occupational Stress Questionnaire    Feeling of Stress : Not at all  Social Connections: Moderately Integrated (04/15/2021)   Social Connection and Isolation Panel [NHANES]    Frequency of Communication with Friends and Family: More than three times a week    Frequency of Social Gatherings with Friends and Family: Once a week    Attends Religious Services: More than 4 times per year    Active Member of [employer] or Organizations: Yes    Attends Banker Meetings: More than 4 times per year    Marital Status: Widowed  Intimate Partner Violence: Not At Risk (04/15/2021)   Humiliation, Afraid, Rape, and Kick questionnaire    Fear of Current or Ex-Partner: No     Emotionally Abused: No    Physically Abused: No    Sexually Abused: No    Past Surgical History:  Procedure Laterality Date   BIOPSY  05/12/2016   Procedure: BIOPSY;  Surgeon: Malissa Hippo, MD;  [facility];  Service: Endoscopy;;  gastric   BIOPSY  10/04/2018   Procedure: BIOPSY;  Surgeon: Malissa Hippo, MD;  [facility];  Service: Endoscopy;;  gastric    CARPAL TUNNEL RELEASE     right; 1998, left 1998   CESAREAN SECTION  1982   COLONOSCOPY WITH PROPOFOL N/A 10/04/2018   Procedure: COLONOSCOPY WITH PROPOFOL;  Surgeon: Malissa Hippo, MD;  [facility];  Service: Endoscopy;  Laterality: N/A;  7:30   Colonscopy     ESOPHAGEAL DILATION N/A 05/12/2016   Procedure: ESOPHAGEAL DILATION;  Surgeon: Malissa Hippo, MD;  [facility];  Service: Endoscopy;  Laterality: N/A;   ESOPHAGOGASTRODUODENOSCOPY N/A 05/12/2016   Procedure: ESOPHAGOGASTRODUODENOSCOPY (EGD);  Surgeon: Malissa Hippo, MD;  [facility];  Service: Endoscopy;  Laterality: N/A;  9:30   ESOPHAGOGASTRODUODENOSCOPY (EGD) WITH PROPOFOL N/A 10/04/2018   Procedure: ESOPHAGOGASTRODUODENOSCOPY (EGD) WITH PROPOFOL;  Surgeon: Malissa Hippo, MD;  [facility];  Service: Endoscopy;  Laterality: N/A;   EYE SURGERY     lasix   FLEXIBLE SIGMOIDOSCOPY N/A 01/28/2021   Procedure: FLEXIBLE SIGMOIDOSCOPY WITH PROPOFOL;  Surgeon: Malissa Hippo, MD;  [facility];  Service: Endoscopy;  Laterality: N/A;  9:30   JOINT REPLACEMENT     both hips   LUMBAR LAMINECTOMY/DECOMPRESSION MICRODISCECTOMY N/A 08/05/2020   Procedure: LAMINECTOMY THORACIC TEN-ELEVEN;  Surgeon: Coletta Memos, MD;  Location: [facility];  Service: Neurosurgery;  Laterality: N/A;   LUMBAR LAMINECTOMY/DECOMPRESSION MICRODISCECTOMY N/A 10/14/2020   Procedure: Right Lumbar 4-5, Lumbar 5 Sacral 1 Lumbar and thoracic laminectomy;  Surgeon: Coletta Memos, MD;  Location: [facility];  Service: Neurosurgery;  Laterality:  N/A;  Right Lumbar 4-5, Lumbar 5 Sacral 1 Lumbar and thoracic laminectomy   TOTAL HIP ARTHROPLASTY     right; March 2013; left March 2011      Current Outpatient Medications:    acetaminophen (TYLENOL) 650 MG CR tablet, Take 1,300 mg by mouth every 8 (eight) hours as needed for pain., Disp: , Rfl:    atorvastatin (LIPITOR) 80 MG tablet, Take 80 mg by mouth at bedtime. , Disp: , Rfl:    Cholecalciferol (DIALYVITE VITAMIN D 5000) 125 MCG (5000 UT) capsule, Take 5,000 Units by mouth daily., Disp: , Rfl:    COLLAGEN PO, Take 1 capsule  by mouth daily., Disp: , Rfl:    HYDROcodone-acetaminophen (NORCO/VICODIN) 5-325 MG tablet, Take 1 tablet by mouth 3 (three) times daily as needed for moderate pain., Disp: , Rfl:    polyethylene glycol (MIRALAX / GLYCOLAX) 17 g packet, Take 17 g by mouth daily. One capful daily in coffee, Disp: , Rfl:    Psyllium (METAMUCIL PO), Take 1 Dose by mouth daily. 1 dose = 2 teaspoons, Disp: , Rfl:    Specialty Vitamins Products (BIOTIN PLUS KERATIN PO), Take 1 capsule by mouth daily., Disp: , Rfl:    tiZANidine (ZANAFLEX) 4 MG tablet, Take 1 tablet (4 mg total) by mouth every 6 (six) hours as needed for muscle spasms., Disp: 20 tablet, Rfl: 0   Turmeric Curcumin 500 MG CAPS, Take 500 mg by mouth daily., Disp: , Rfl:     Physical Exam: Blood pressure (!) 192/100, pulse 62, height 5\' 2"  (1.575 m), weight 153 lb 12.8 oz (69.8 kg), SpO2 98 %.    Affect appropriate Healthy:  appears stated age HEENT: normal Neck supple with no adenopathy JVP normal no bruits no thyromegaly Lungs clear with no wheezing and good diaphragmatic motion Heart:  S1/S2 mild AS/AR murmur, no rub, gallop or click PMI normal Abdomen: benighn, BS positve, no tenderness, no AAA no bruit.  No HSM or HJR Distal pulses intact with no bruits No edema Neuro non-focal Skin warm and dry No muscular weakness   Labs:   Lab Results  Component Value Date   WBC 6.7 12/14/2021   HGB 12.2 12/14/2021    HCT 37.8 12/14/2021   MCV 86.9 12/14/2021   PLT 307 12/14/2021    Recent Labs  Lab 01/02/22 0853  PROT 6.9  BILITOT 0.4  ALKPHOS 88  ALT 33*  AST 20   Lab Results  Component Value Date   TROPONINI <0.03 04/08/2018    Lab Results  Component Value Date   CHOL 209 (H) 01/02/2022   Lab Results  Component Value Date   HDL 43 01/02/2022   Lab Results  Component Value Date   LDLCALC 131 (H) 01/02/2022   Lab Results  Component Value Date   TRIG 196 (H) 01/02/2022   Lab Results  Component Value Date   CHOLHDL 4.9 (H) 01/02/2022   No results found for: "LDLDIRECT"    Radiology: CT CARDIAC SCORING (SELF PAY ONLY)  Addendum Date: 12/28/2021   ADDENDUM REPORT: 12/28/2021 23:15 CLINICAL DATA:  Cardiovascular disease risk stratification Chest pain/anginal equiv, intermediate CAD risk, not treadmill candidate EXAM: CT Coronary Calcium Score TECHNIQUE: A gated, non-contrast computed tomography scan of the heart was performed using 57mm slice thickness. Axial images were analyzed on a dedicated workstation. Calcium scoring of the coronary arteries was performed using the Agatston method. FINDINGS: Coronary Calcium Score: Left main: 27.7 Left anterior descending artery: 138 Left circumflex artery: 81 Right coronary artery: 11 Total: 257 Percentile: 83rd Aortic valve is mildly calcified.  AV calcium score 195. Pericardium: Normal. Ascending Aorta: Normal caliber. Ascending aorta measures approximately 66mm at the mid ascending aorta measured in a non-contrast axial plane. Non-cardiac: See separate report from [facility]. IMPRESSION: 1. Coronary calcium score of 257. This was 83rd percentile for age-, race-, and sex-matched controls. 2.  Mild aortic valve calcifications, AV calcium score 195. RECOMMENDATIONS: Coronary artery calcium (CAC) score is a strong predictor of incident coronary heart disease (CHD) and provides predictive information beyond traditional risk factors. CAC  scoring is reasonable to use in the decision to withhold, postpone,  or initiate statin therapy in intermediate-risk or selected borderline-risk asymptomatic adults (age 50-75 years and LDL-C >=70 to <190 mg/dL) who do not have diabetes or established atherosclerotic cardiovascular disease (ASCVD).* In intermediate-risk (10-year ASCVD risk >=7.5% to <20%) adults or selected borderline-risk (10-year ASCVD risk >=5% to <7.5%) adults in whom a CAC score is measured for the purpose of making a treatment decision the following recommendations have been made: If CAC=0, it is reasonable to withhold statin therapy and reassess in 5 to 10 years, as long as higher risk conditions are absent (diabetes mellitus, family history of premature CHD in first degree relatives (males <55 years; females <65 years), cigarette smoking, or LDL >=190 mg/dL). If CAC is 1 to 99, it is reasonable to initiate statin therapy for patients >=57 years of age. If CAC is >=100 or >=75th percentile, it is reasonable to initiate statin therapy at any age. Cardiology referral should be considered for patients with CAC scores >=400 or >=75th percentile. *2018 AHA/ACC/AACVPR/AAPA/ABC/ACPM/ADA/AGS/APhA/ASPC/NLA/PCNA Guideline on the Management of Blood Cholesterol: A Report of the American College of Cardiology/American Heart Association Task Force on Clinical Practice Guidelines. J Am Coll Cardiol. 2019;73(24):3168-3209. Electronically Signed   By: Weston Brass M.D.   On: 12/28/2021 23:15   Result Date: 12/28/2021 CLINICAL DATA:  The following report is an over-read performed by radiologist Dr. Lupita Raider of [facility] on 12/28/2021. This over-read does not include interpretation of cardiac or coronary anatomy or pathology. The coronary calcium score interpretation by the cardiologist is attached. COMPARISON:  Jul 22, 2019. FINDINGS: The visualized portions of the extracardiac vascular structures are unremarkable. Visualized  mediastinum is unremarkable. Visualized portion of upper abdomen is unremarkable. Visualized pulmonary parenchyma is unremarkable. Visualized skeleton is unremarkable. IMPRESSION: No significant abnormality seen involving the visualized extracardiac structures of the chest. Electronically Signed: By: Lupita Raider M.D. On: 12/28/2021 14:00    EKG: 01/03/22 SR rate 62 low voltage normal   ASSESSMENT AND PLAN:   CAD: Risk high calcium score for age no chest pain normal ECG continue statin Labs with primary Bruising:  UE;s f/u oncology labs normal meds for neuropathy and depression adjusted Depression: since husbands death now on Pristiq AS: mild with mild AR f/u echo  11/2022 HLD:  LDL 131 Triglycerides 196 30/09/23 Zetia added to high dose lipitor f/u labs in 3 months and refer to lipid clinic May need to change to PSK9  Lipid/Liver in 3 months F/U lipid clinic   F/U in a year   Signed: Charlton Haws 01/03/2022, 10:51 AM

## 2021-12-30 ENCOUNTER — Telehealth: Payer: Self-pay

## 2021-12-30 DIAGNOSIS — E7800 Pure hypercholesterolemia, unspecified: Secondary | ICD-10-CM

## 2021-12-30 DIAGNOSIS — R931 Abnormal findings on diagnostic imaging of heart and coronary circulation: Secondary | ICD-10-CM

## 2021-12-30 NOTE — Telephone Encounter (Signed)
The patient has been notified of the result and verbalized understanding.  All questions (if any) were answered. Cynthia Barter, RN 12/30/2021 4:31 PM   Patient getting lab work done on Monday.

## 2021-12-30 NOTE — Telephone Encounter (Signed)
-----   Message from Josue Hector, MD sent at 12/29/2021  7:29 AM EDT ----- Calcium score high for age she is on statin need update lipids ? From primary

## 2022-01-02 ENCOUNTER — Encounter: Payer: Self-pay | Admitting: *Deleted

## 2022-01-02 DIAGNOSIS — E7800 Pure hypercholesterolemia, unspecified: Secondary | ICD-10-CM | POA: Diagnosis not present

## 2022-01-02 DIAGNOSIS — R931 Abnormal findings on diagnostic imaging of heart and coronary circulation: Secondary | ICD-10-CM | POA: Diagnosis not present

## 2022-01-03 ENCOUNTER — Telehealth: Payer: Self-pay | Admitting: Cardiovascular Disease

## 2022-01-03 ENCOUNTER — Encounter: Payer: Medicare Other | Attending: Obstetrics and Gynecology | Admitting: Physical Therapy

## 2022-01-03 ENCOUNTER — Ambulatory Visit: Payer: Medicare Other | Attending: Cardiovascular Disease | Admitting: Cardiovascular Disease

## 2022-01-03 ENCOUNTER — Encounter: Payer: Self-pay | Admitting: Physical Therapy

## 2022-01-03 ENCOUNTER — Encounter: Payer: Self-pay | Admitting: Cardiovascular Disease

## 2022-01-03 VITALS — BP 192/100 | HR 62 | Ht 62.0 in | Wt 153.8 lb

## 2022-01-03 DIAGNOSIS — R252 Cramp and spasm: Secondary | ICD-10-CM | POA: Diagnosis present

## 2022-01-03 DIAGNOSIS — E7800 Pure hypercholesterolemia, unspecified: Secondary | ICD-10-CM | POA: Insufficient documentation

## 2022-01-03 DIAGNOSIS — R931 Abnormal findings on diagnostic imaging of heart and coronary circulation: Secondary | ICD-10-CM | POA: Insufficient documentation

## 2022-01-03 DIAGNOSIS — R278 Other lack of coordination: Secondary | ICD-10-CM | POA: Diagnosis present

## 2022-01-03 DIAGNOSIS — I350 Nonrheumatic aortic (valve) stenosis: Secondary | ICD-10-CM | POA: Diagnosis not present

## 2022-01-03 DIAGNOSIS — R011 Cardiac murmur, unspecified: Secondary | ICD-10-CM | POA: Insufficient documentation

## 2022-01-03 DIAGNOSIS — M62838 Other muscle spasm: Secondary | ICD-10-CM | POA: Insufficient documentation

## 2022-01-03 LAB — LIPID PANEL
Chol/HDL Ratio: 4.9 ratio — ABNORMAL HIGH (ref 0.0–4.4)
Cholesterol, Total: 209 mg/dL — ABNORMAL HIGH (ref 100–199)
HDL: 43 mg/dL (ref >39)
LDL Chol Calc (NIH): 131 mg/dL — ABNORMAL HIGH (ref 0–99)
Triglycerides: 196 mg/dL — ABNORMAL HIGH (ref 0–149)
VLDL Cholesterol Cal: 35 mg/dL (ref 5–40)

## 2022-01-03 LAB — HEPATIC FUNCTION PANEL
ALT: 33 IU/L — ABNORMAL HIGH (ref 0–32)
AST: 20 IU/L (ref 0–40)
Albumin: 4.1 g/dL (ref 3.8–4.8)
Alkaline Phosphatase: 88 IU/L (ref 44–121)
Bilirubin Total: 0.4 mg/dL (ref 0.0–1.2)
Bilirubin, Direct: 0.12 mg/dL (ref 0.00–0.40)
Total Protein: 6.9 g/dL (ref 6.0–8.5)

## 2022-01-03 MED ORDER — LOSARTAN POTASSIUM 50 MG PO TABS: 50.0000 mg | ORAL_TABLET | Freq: Every day | ORAL | 3 refills | Status: DC

## 2022-01-03 NOTE — Patient Instructions (Addendum)
Medication Instructions:  Your physician has recommended you make the following change in your medication:  1-INCREASE Losartan 50 mg by mouth daily  *If you need a refill on your cardiac medications before your next appointment, please call your pharmacy*  Lab Work: Your physician recommends that you return for lab work in: 3 months for fasting lipid and liver panel.  If you have labs (blood work) drawn today and your tests are completely normal, you will receive your results only by: [place] (if you have MyChart) OR A paper copy in the mail If you have any lab test that is abnormal or we need to change your treatment, we will call you to review the results.  Testing/Procedures: None ordered today.  Follow-Up: At [facility], you and your health needs are our priority.  As part of our continuing mission to provide you with exceptional heart care, we have created designated Provider Care Teams.  These Care Teams include your primary Cardiologist (physician) and Advanced Practice Providers (APPs -  Physician Assistants and Nurse Practitioners) who all work together to provide you with the care you need, when you need it.  We recommend signing up for the patient portal called "MyChart".  Sign up information is provided on this After Visit Summary.  MyChart is used to connect with patients for Virtual Visits (Telemedicine).  Patients are able to view lab/test results, encounter notes, upcoming appointments, etc.  Non-urgent messages can be sent to your provider as well.   To learn more about what you can do with MyChart, go to [email or web address].    Your next appointment:   1 year(s)  The format for your next appointment:   In Person  Provider:   Jenkins Rouge, MD      Important Information About Sugar

## 2022-01-03 NOTE — Therapy (Signed)
OUTPATIENT PHYSICAL THERAPY TREATMENT NOTE   Patient Name: Cynthia Cochran MRN: [id number] DOB:09/30/1950, 71 y.o., female Today's Date: 01/03/2022  PCP: Celene Squibb, MD REFERRING PROVIDER: Jaquita Folds, MD  END OF SESSION:   PT End of Session - 01/03/22 1604     Visit Number 2    Date for PT Re-Evaluation 03/20/22    Authorization Type Medicare    Authorization - Visit Number 2    Authorization - Number of Visits 10    PT Start Time 1600    PT Stop Time 1645    PT Time Calculation (min) 45 min    Activity Tolerance Patient tolerated treatment well    Behavior During Therapy [facility] for tasks assessed/performed             Past Medical History:  Diagnosis Date   Anxiety    Arthritis    Blood transfusion without reported diagnosis 1982   after c section   Dysphagia 04/25/2016   Gastric ulcer    GERD (gastroesophageal reflux disease)    Heart murmur    High cholesterol 04/25/2016   Hyperlipidemia    Mixed stress and urge urinary incontinence 09/09/2015   Sciatic nerve pain    Spinal stenosis    Past Surgical History:  Procedure Laterality Date   BIOPSY  05/12/2016   Procedure: BIOPSY;  Surgeon: Rogene Houston, MD;  [facility];  Service: Endoscopy;;  gastric   BIOPSY  10/04/2018   Procedure: BIOPSY;  Surgeon: Rogene Houston, MD;  [facility];  Service: Endoscopy;;  gastric    CARPAL TUNNEL RELEASE     right; 1998, left [place]   COLONOSCOPY WITH PROPOFOL N/A 10/04/2018   Procedure: COLONOSCOPY WITH PROPOFOL;  Surgeon: Rogene Houston, MD;  [facility];  Service: Endoscopy;  Laterality: N/A;  7:30   Colonscopy     ESOPHAGEAL DILATION N/A 05/12/2016   Procedure: ESOPHAGEAL DILATION;  Surgeon: Rogene Houston, MD;  [facility];  Service: Endoscopy;  Laterality: N/A;   ESOPHAGOGASTRODUODENOSCOPY N/A 05/12/2016   Procedure: ESOPHAGOGASTRODUODENOSCOPY (EGD);  Surgeon: Rogene Houston, MD;   [facility];  Service: Endoscopy;  Laterality: N/A;  9:30   ESOPHAGOGASTRODUODENOSCOPY (EGD) WITH PROPOFOL N/A 10/04/2018   Procedure: ESOPHAGOGASTRODUODENOSCOPY (EGD) WITH PROPOFOL;  Surgeon: Rogene Houston, MD;  [facility];  Service: Endoscopy;  Laterality: N/A;   EYE SURGERY     lasix   FLEXIBLE SIGMOIDOSCOPY N/A 01/28/2021   Procedure: FLEXIBLE SIGMOIDOSCOPY WITH PROPOFOL;  Surgeon: Rogene Houston, MD;  [facility];  Service: Endoscopy;  Laterality: N/A;  9:30   JOINT REPLACEMENT     both hips   LUMBAR LAMINECTOMY/DECOMPRESSION MICRODISCECTOMY N/A 08/05/2020   Procedure: LAMINECTOMY THORACIC TEN-ELEVEN;  Surgeon: Ashok Pall, MD;  Location: [place];  Service: Neurosurgery;  Laterality: N/A;   LUMBAR LAMINECTOMY/DECOMPRESSION MICRODISCECTOMY N/A 10/14/2020   Procedure: Right Lumbar 4-5, Lumbar 5 Sacral 1 Lumbar and thoracic laminectomy;  Surgeon: Ashok Pall, MD;  Location: [place];  Service: Neurosurgery;  Laterality: N/A;  Right Lumbar 4-5, Lumbar 5 Sacral 1 Lumbar and thoracic laminectomy   TOTAL HIP ARTHROPLASTY     right; March 2013; left March 2011   Patient Active Problem List   Diagnosis Date Noted   Pelvic relaxation due to cystocele, midline 04/15/2021   Postmenopausal 04/15/2021   Rectal pain 04/05/2021   Anal fissure 12/21/2020   Constipation  08/12/2020   Thoracic spinal stenosis 08/05/2020   Gait disturbance 11/04/2018   Numbness and tingling 11/04/2018   Lumbar radiculopathy 11/04/2018   Cervical stenosis of spinal canal 11/04/2018   PUD (peptic ulcer disease) 05/10/2018   Anemia 05/10/2018   Special screening for malignant neoplasms, colon 05/10/2018   Abdominal pain 04/07/2018   Hypokalemia 04/07/2018   Osteoarthritis 04/07/2018   Elevated blood pressure reading 04/07/2018   Family hx of colon cancer 02/27/2018   Screening for colorectal cancer 11/06/2017   Encounter for well woman exam with routine gynecological exam  11/06/2017   High cholesterol 04/25/2016   Dysphagia 04/25/2016   Esophageal dysphagia 04/25/2016   Mixed stress and urge urinary incontinence 09/09/2015   REFERRING DIAG: X72.620 (ICD-10-CM) - Levator spasm   THERAPY DIAG:  No diagnosis found.   Rationale for Evaluation and Treatment Rehabilitation   ONSET DATE: 2021   SUBJECTIVE:                                                                                                                                                                                            SUBJECTIVE STATEMENT: I have been using the wand and it is helping.     PAIN:  Are you having pain? No   PRECAUTIONS: None   WEIGHT BEARING RESTRICTIONS No   FALLS:  Has patient fallen in last 6 months? No   LIVING ENVIRONMENT: Lives with: lives with their family     OCCUPATION: retired; no exercise   PLOF: Independent   PATIENT GOALS rectum to feel normal   PERTINENT HISTORY:  C-section; bil. Hips replaced; lumbar laminectomy;    BOWEL MOVEMENT Pain with bowel movement: No Type of bowel movement:Type (Bristol Stool Scale) type 4 at 3 inches, Frequency daily for several times, Strain No, and Splinting no Fully empty rectum: No, uses the squatty potty Leakage: No Fiber supplement: Yes: miralax, metamucil   URINATION Pain with urination: No Fully empty bladder: Yes:   Stream: Strong Urgency: No Frequency: Day time voids 3-4.  Nocturia: 0 times per night to void Leakage: Urge to void, Walking to the bathroom, Coughing, and Sneezing, not leakage for the past week Pads: 1 liners/ mini-pads per day   INTERCOURSE not sexually active   PREGNANCY C-section deliveries 1 still born           OBJECTIVE:    DIAGNOSTIC FINDINGS:  Pelvic floor strength I/V, puborectalis III/V external anal sphincter IV/V  Pelvic floor musculature: + tension in puborectalis on rectal exam, On vaginal exam, Right levator tender, Right obturator tender, Left levator  tender, Left obturator tender PVR of 31 ml  was obtained by bladder scan     COGNITION:            Overall cognitive status: Within functional limits for tasks assessed                          SENSATION:            Light touch: Appears intact            Proprioception: Appears intact                    POSTURE: No Significant postural limitations               PELVIC ALIGNMENT: Bilateral ASIS are equal   LUMBARAROM/PROM not assessed due to having an epidural this morning.      LOWER EXTREMITY ROM: Within functional limits     LOWER EXTREMITY MMT: bilateral hip strength is 4/5      PALPATION:   General  Patient over contracts the upper abdominals and does not contract the lower abdominals.                  External Perineal Exam vaginal dryness, tightness along the perineal body and superior transverse                             Internal Pelvic Floor tenderness located in the left puborectalis, bilateral obturator internist; tightness in bilateral iliococcygeus, puborectalis,    Patient confirms identification and approves PT to assess internal pelvic floor and treatment Yes   PELVIC MMT:   MMT eval 01/03/2022  Vaginal 2/5 anterior/posterior; 1/5 laterally 2/5 with circular contraction holding for 2 seconds; at times she will bulge her pelvic floor  Internal Anal Sphincter 3/5   External Anal Sphincter 3/5   Puborectalis 2/5   (Blank rows = not tested)         TONE: increased   PROLAPSE: none   TODAY'S TREATMENT  01/03/2022 Manual: Myofascial release:release of the urogenital diaphragm Internal pelvic floor techniques:No emotional/communication barriers or cognitive limitation. Patient is motivated to learn. Patient understands and agrees with treatment goals and plan. PT explains patient will be examined in standing, sitting, and lying down to see how their muscles and joints work. When they are ready, they will be asked to remove their underwear so PT can  examine their perineum. The patient is also given the option of providing their own chaperone as one is not provided in our facility. The patient also has the right and is explained the right to defer or refuse any part of the evaluation or treatment including the internal exam. With the patient's consent, PT will use one gloved finger to gently assess the muscles of the pelvic floor, seeing how well it contracts and relaxes and if there is muscle symmetry. After, the patient will get dressed and PT and patient will discuss exam findings and plan of care. PT and patient discuss plan of care, schedule, attendance policy and HEP activities.  Manual work on the [employer], along the ATLA, obturator internist internally One finger internally and other externally on the urogenital diaphragm working on the tissue Neuromuscular re-education: Pelvic floor contraction training:one finger in the vaginal canal taping on the sides of the introitus to work on the lateral contraction and give tactile cues to contract instead of bulging Supine hip flexion isometric to engage the lower abdomen 5  x each side hold 5 sec Supine ball squeeze with pelvic floor contraction keeping the gluteals relaxed Down training:diaphragmatic breathing in supine Exercises: Stretches/mobility:supine hamstring stretch holding for 30 sec bil.  Butterfly stretch holding for 30 sec.  Cat cow with tactile cues    EVAL Date: 12/26/2021 HEP established-see below        PATIENT EDUCATION:  Education details: Access Code: [email or web address] Person educated: Patient Education method: Explanation Education comprehension: verbalized understanding     HOME EXERCISE PROGRAM: Access Code: [email or web address] URL: [email or web address] Date: 01/03/2022 Prepared by: Earlie Counts  Exercises - Hooklying Isometric Hip Flexion  - 1 x daily - 7 x weekly - 1 sets - 5 reps - 5 sec hold - Supine Hip Adduction Isometric with Ball  - 1 x daily - 7 x  weekly - 1 sets - 10 reps - 4 sec hold - Supine Hamstring Stretch  - 1 x daily - 7 x weekly - 1 sets - 2 reps - 30 sec hold - Supported Occupational psychologist with Pelvic Floor Relaxation  - 1 x daily - 7 x weekly - 1 sets - 1 reps - 30 sec hold    ASSESSMENT:   CLINICAL IMPRESSION: Patient is a 71 y.o. female who was seen today for physical therapy  treatment for levator spasm.  Pelvic floor strength increased to 2/5 with circular contraction holding for 2 seconds. She will bulge her pelvic floor at times when thinking she is contracting. Patient needs verbal cues to relax her gluteals. Patient will benefit from skilled therapy to improve pelvic floor coordination to relax the pelvic floor and have a full bowel movement and reduce urinary leakage.      OBJECTIVE IMPAIRMENTS decreased coordination, decreased endurance, increased fascial restrictions, increased muscle spasms, and impaired tone.    ACTIVITY LIMITATIONS toileting   PARTICIPATION LIMITATIONS: community activity   PERSONAL FACTORS Fitness and 3+ comorbidities: C-section; bil. Hips replaced; lumbar laminectomy  are also affecting patient's functional outcome.    REHAB POTENTIAL: Excellent   CLINICAL DECISION MAKING: Stable/uncomplicated   EVALUATION COMPLEXITY: Low     GOALS: Goals reviewed with patient? Yes   SHORT TERM GOALS: Target date: 01/23/2022   Patient independent with the use of the pelvic wand to work on the pelvic floor tissue through the anus and vagina.  Baseline: Goal status: met 01/03/2022   2.  Patient able to perform diaphragmatic breathing in sitting to relax the pelvic floor.  Baseline:  Goal status: INITIAL   3.  Patient reports the tightness in the pelvic floor has reduced >/= 25% due to reduction of the trigger points.  Baseline:  Goal status: INITIAL       LONG TERM GOALS: Target date: 03/20/2022    Patient is independent with her HEP to relax the pelvic floor and strengthen.  Baseline:   Goal status: INITIAL   2.  Patient reports no urinary leakage due to pelvic floor strength is >/= 3/5.  Baseline:  Goal status: INITIAL   3.  Patient is able to push the full stool our and not feel she has some remaining due to the ability to fully relax her pelvic floor.  Baseline:  Goal status: INITIAL   4.  Patient is able to sit without feeling a band or ball in the anal region due to reduction of tightness in the pelvic floor  Baseline:  Goal status: INITIAL     PLAN: PT FREQUENCY: 1x/week   PT DURATION: 12 weeks   PLANNED  INTERVENTIONS: Therapeutic exercises, Therapeutic activity, Neuromuscular re-education, Patient/Family education, Self Care, Joint mobilization, Dry Needling, Electrical stimulation, Spinal mobilization, Cryotherapy, Moist heat, Biofeedback, and Manual therapy   PLAN FOR NEXT SESSION: manual work to the pelvic floor vanally, diaphragmatic breathing to do pelvic floor drop,pelvic floor contraction with not contracting the gluteal, manual work to the [employer], PT 01/03/22 4:50 PM

## 2022-01-03 NOTE — Telephone Encounter (Signed)
Patient states she is returning a call today for test results.

## 2022-01-04 ENCOUNTER — Encounter: Payer: Self-pay | Admitting: Obstetrics and Gynecology

## 2022-01-04 ENCOUNTER — Ambulatory Visit (INDEPENDENT_AMBULATORY_CARE_PROVIDER_SITE_OTHER): Payer: Medicare Other | Admitting: Obstetrics and Gynecology

## 2022-01-04 VITALS — BP 181/84 | HR 73

## 2022-01-04 DIAGNOSIS — M62838 Other muscle spasm: Secondary | ICD-10-CM | POA: Diagnosis not present

## 2022-01-04 MED ORDER — TRIAMCINOLONE ACETONIDE 40 MG/ML IJ SUSP
40.0000 mg | Freq: Once | INTRAMUSCULAR | Status: AC
  Administered 2022-01-04: 40 mg via INTRAMUSCULAR

## 2022-01-04 MED ORDER — BUPIVACAINE HCL 0.25 % IJ SOLN
9.0000 mL | Freq: Once | INTRAMUSCULAR | Status: AC
  Administered 2022-01-04: 9 mL

## 2022-01-04 NOTE — Addendum Note (Signed)
Addended by: Aris Georgia, Jenesa Foresta L on: 01/04/2022 08:58 AM   Modules accepted: Orders

## 2022-01-04 NOTE — Addendum Note (Signed)
Addended by: Jaquita Folds on: 01/04/2022 10:20 AM   Modules accepted: Orders

## 2022-01-04 NOTE — Progress Notes (Signed)
Ms. Cynthia Cochran is a 71 y.o. female who presents for levator trigger point injection. Series #3    PT has been helping a lot. Has been doing pelvic floor muscle massage at home when she is not able to get to PT.   Indication(s): Levator spasm.   Informed Consent:  The alternatives, risks and benefits of the procedure were explained to the patient. Risks including, but not limited to discomfort, pain, bleeding, infection, injury to nearby structures, inability to perform the procedure, failure of the procedure were discussed.  All questions were answered and the patient elected to proceed.  Procedure:   The patient was positioned in dorsal lithotomy position.  The vaginal tissues were prepped with Hibiclens solution.  An injection of 10cc of a mixture of 90% anesthetic (0.25% Bupivacaine) and 10% 40mg /ml Triamcinolone acetomide (Kenalog) was performed in multiple in the bilateral levator muscles, a total of 6 injection sites. Pressure was held over bleeding areas until good hemostasis was achieved.   The patient tolerated the procedure well with no apparent complications. Return 1 week for repeat injection.   Cynthia Folds, MD

## 2022-01-04 NOTE — Telephone Encounter (Signed)
Already called patient back about results.

## 2022-01-09 ENCOUNTER — Encounter: Payer: Self-pay | Admitting: Plastic Surgery

## 2022-01-09 ENCOUNTER — Ambulatory Visit (INDEPENDENT_AMBULATORY_CARE_PROVIDER_SITE_OTHER): Payer: Self-pay | Admitting: Plastic Surgery

## 2022-01-09 DIAGNOSIS — Z719 Counseling, unspecified: Secondary | ICD-10-CM | POA: Insufficient documentation

## 2022-01-09 DIAGNOSIS — Z1211 Encounter for screening for malignant neoplasm of colon: Secondary | ICD-10-CM | POA: Insufficient documentation

## 2022-01-09 NOTE — Progress Notes (Signed)
Botulinum Toxin and Filler Injection Procedure Note  Procedure: Cosmetic botulinum toxin and Filler administration  Pre-operative Diagnosis: Dynamic rhytides and midface volume loss  Post-operative Diagnosis: Same  Complications:  None  Brief history: The patient desires botulinum toxin injection of her forehead. I discussed with the patient this proposed procedure of botulinum toxin injections, which is customized depending on the particular needs of the patient. It is performed on facial rhytids as a temporary correction. The alternatives were discussed with the patient. The risks were addressed including bleeding, scarring, infection, damage to deeper structures, asymmetry, and chronic pain, which may occur infrequently after a procedure. The individual's choice to undergo a surgical procedure is based on the comparison of risks to potential benefits. Other risks include unsatisfactory results, brow ptosis, eyelid ptosis, allergic reaction, temporary paralysis, which should go away with time, bruising, blurring disturbances and delayed healing. Botulinum toxin injections do not arrest the aging process or produce permanent tightening of the eyelid.  Operative intervention maybe necessary to maintain the results of a blepharoplasty or botulinum toxin. The patient understands and wishes to proceed.  Procedure: The area was prepped with alcohol and dried with a clean gauze. Using a clean technique, the botulinum toxin was diluted with 1.25 cc of preservative-free normal saline which was slowly injected with an 18 gauge needle in a tuberculin syringes.  A 32 gauge needles were then used to inject the botulinum toxin. This mixture allow for an aliquot of 4 units per 0.1 cc in each injection site.    Subsequently the mixture was injected in the glabellar and forehead area with preservation of the temporal branch to the lateral eyebrow as well as into each lateral canthal area beginning from the lateral  orbital rim medial to the zygomaticus major in 3 separate areas. A total of 20 Units of botulinum toxin was used. The forehead and glabellar area was injected with care to inject intramuscular only while holding pressure on the supratrochlear vessels in each area during each injection on either side of the medial corrugators. The injection proceeded vertically superiorly to the medial 2/3 of the frontalis muscle and superior 2/3 of the lateral frontalis, again with preservation of the frontal branch.  The midface area was injected at the 3 sub-regions of the mid-face: zygomaticomalar region, anteromedial cheek region, and submalar region for a total of one syringe on each side of the face. The technique used was serial puncture with equal injections in the 3 sub-regions: the zygomaticomalar region, the anteromedial cheek, and the submalar region.  The nasolabial folds were injected with 0.5 cc on each side. No complications were noted. Light pressure was held for 5 minutes. She was instructed explicitly in post-operative care.  Botox LOT:  [id number]  EXP:  2025/11  J. Voluma XC x 2 LOT: [id number] J. Ultra Plus XC  LOT: [id number]

## 2022-01-10 ENCOUNTER — Encounter: Payer: Self-pay | Admitting: Obstetrics and Gynecology

## 2022-01-10 ENCOUNTER — Ambulatory Visit (INDEPENDENT_AMBULATORY_CARE_PROVIDER_SITE_OTHER): Payer: Medicare Other | Admitting: Obstetrics and Gynecology

## 2022-01-10 VITALS — BP 182/77 | HR 64

## 2022-01-10 DIAGNOSIS — R102 Pelvic and perineal pain: Secondary | ICD-10-CM | POA: Diagnosis not present

## 2022-01-10 DIAGNOSIS — M62838 Other muscle spasm: Secondary | ICD-10-CM

## 2022-01-10 MED ORDER — TRIAMCINOLONE ACETONIDE 40 MG/ML IJ SUSP
40.0000 mg | Freq: Once | INTRAMUSCULAR | Status: AC
  Administered 2022-01-10: 40 mg via INTRAMUSCULAR

## 2022-01-10 MED ORDER — BUPIVACAINE HCL (PF) 0.25 % IJ SOLN
9.0000 mL | Freq: Once | INTRAMUSCULAR | Status: AC
  Administered 2022-01-10: 9 mL

## 2022-01-10 NOTE — Progress Notes (Signed)
Ms. ZEVA LEBER is a 71 y.o. female who presents for levator trigger point injection. Series #4  Vitals:   01/10/22 0820  BP: (!) 182/77  Pulse: 64     Has been having more difficulty with sitting for long periods. Pain is more perineal, near the rectum at this time.   Indication(s): Levator spasm, perineal pain.   Informed Consent:  The alternatives, risks and benefits of the procedure were explained to the patient. Risks including, but not limited to discomfort, pain, bleeding, infection, injury to nearby structures, inability to perform the procedure, failure of the procedure were discussed.  All questions were answered and the patient elected to proceed.  Procedure:   The patient was positioned in dorsal lithotomy position.  The vaginal tissues were prepped with Hibiclens solution.  An injection of 10cc of a mixture of 90% anesthetic (0.25% Bupivacaine) and 10% 40mg /ml Triamcinolone acetomide (Kenalog) was performed into puborectalis through the vagina in 2 locations. The ischeal spine was palpated, and 3cc of the solution was injected inferiorly for a pudendal nerve block, performed bilaterally. Pressure was held over bleeding areas until good hemostasis was achieved.   The patient tolerated the procedure well with no apparent complications. Return 1 week for repeat injection.   Jaquita Folds, MD

## 2022-01-10 NOTE — Addendum Note (Signed)
Addended by: Elita Quick on: 01/10/2022 11:36 AM   Modules accepted: Orders

## 2022-01-11 ENCOUNTER — Encounter: Payer: Self-pay | Admitting: Physical Therapy

## 2022-01-11 ENCOUNTER — Ambulatory Visit: Payer: Medicare Other | Attending: Obstetrics and Gynecology | Admitting: Physical Therapy

## 2022-01-11 ENCOUNTER — Ambulatory Visit: Admit: 2022-01-11 | Discharge status: Absent without leave | Payer: Medicare Other

## 2022-01-11 DIAGNOSIS — R252 Cramp and spasm: Secondary | ICD-10-CM

## 2022-01-11 DIAGNOSIS — R278 Other lack of coordination: Secondary | ICD-10-CM

## 2022-01-11 NOTE — Therapy (Signed)
OUTPATIENT PHYSICAL THERAPY TREATMENT NOTE   Patient Name: Cynthia Cochran MRN: [id number] DOB:08/20/1950, 71 y.o., female Today's Date: 01/11/2022  PCP: Celene Squibb, MD REFERRING PROVIDER: Jaquita Folds, MD  END OF SESSION:   PT End of Session - 01/11/22 1146     Visit Number 3    Date for PT Re-Evaluation 03/20/22    Authorization Type Medicare    Authorization - Visit Number 3    Authorization - Number of Visits 10    PT Start Time 8546    PT Stop Time 1225    PT Time Calculation (min) 40 min    Activity Tolerance Patient tolerated treatment well    Behavior During Therapy [facility] for tasks assessed/performed             Past Medical History:  Diagnosis Date   Anxiety    Arthritis    Blood transfusion without reported diagnosis 1982   after c section   Dysphagia 04/25/2016   Gastric ulcer    GERD (gastroesophageal reflux disease)    Heart murmur    High cholesterol 04/25/2016   Hyperlipidemia    Mixed stress and urge urinary incontinence 09/09/2015   Sciatic nerve pain    Spinal stenosis    Past Surgical History:  Procedure Laterality Date   BIOPSY  05/12/2016   Procedure: BIOPSY;  Surgeon: Rogene Houston, MD;  [facility];  Service: Endoscopy;;  gastric   BIOPSY  10/04/2018   Procedure: BIOPSY;  Surgeon: Rogene Houston, MD;  [facility];  Service: Endoscopy;;  gastric    CARPAL TUNNEL RELEASE     right; 1998, left [place]   COLONOSCOPY WITH PROPOFOL N/A 10/04/2018   Procedure: COLONOSCOPY WITH PROPOFOL;  Surgeon: Rogene Houston, MD;  [facility];  Service: Endoscopy;  Laterality: N/A;  7:30   Colonscopy     ESOPHAGEAL DILATION N/A 05/12/2016   Procedure: ESOPHAGEAL DILATION;  Surgeon: Rogene Houston, MD;  [facility];  Service: Endoscopy;  Laterality: N/A;   ESOPHAGOGASTRODUODENOSCOPY N/A 05/12/2016   Procedure: ESOPHAGOGASTRODUODENOSCOPY (EGD);  Surgeon: Rogene Houston, MD;   [facility];  Service: Endoscopy;  Laterality: N/A;  9:30   ESOPHAGOGASTRODUODENOSCOPY (EGD) WITH PROPOFOL N/A 10/04/2018   Procedure: ESOPHAGOGASTRODUODENOSCOPY (EGD) WITH PROPOFOL;  Surgeon: Rogene Houston, MD;  [facility];  Service: Endoscopy;  Laterality: N/A;   EYE SURGERY     lasix   FLEXIBLE SIGMOIDOSCOPY N/A 01/28/2021   Procedure: FLEXIBLE SIGMOIDOSCOPY WITH PROPOFOL;  Surgeon: Rogene Houston, MD;  [facility];  Service: Endoscopy;  Laterality: N/A;  9:30   JOINT REPLACEMENT     both hips   LUMBAR LAMINECTOMY/DECOMPRESSION MICRODISCECTOMY N/A 08/05/2020   Procedure: LAMINECTOMY THORACIC TEN-ELEVEN;  Surgeon: Ashok Pall, MD;  Location: [place];  Service: Neurosurgery;  Laterality: N/A;   LUMBAR LAMINECTOMY/DECOMPRESSION MICRODISCECTOMY N/A 10/14/2020   Procedure: Right Lumbar 4-5, Lumbar 5 Sacral 1 Lumbar and thoracic laminectomy;  Surgeon: Ashok Pall, MD;  Location: [place];  Service: Neurosurgery;  Laterality: N/A;  Right Lumbar 4-5, Lumbar 5 Sacral 1 Lumbar and thoracic laminectomy   TOTAL HIP ARTHROPLASTY     right; March 2013; left March 2011   Patient Active Problem List   Diagnosis Date Noted   Encounter for counseling 01/09/2022   Pelvic relaxation due to cystocele, midline 04/15/2021   Postmenopausal 04/15/2021   Rectal pain 04/05/2021  Anal fissure 12/21/2020   Constipation 08/12/2020   Thoracic spinal stenosis 08/05/2020   Gait disturbance 11/04/2018   Numbness and tingling 11/04/2018   Lumbar radiculopathy 11/04/2018   Cervical stenosis of spinal canal 11/04/2018   PUD (peptic ulcer disease) 05/10/2018   Anemia 05/10/2018   Special screening for malignant neoplasms, colon 05/10/2018   Abdominal pain 04/07/2018   Hypokalemia 04/07/2018   Osteoarthritis 04/07/2018   Elevated blood pressure reading 04/07/2018   Family hx of colon cancer 02/27/2018   Screening for colorectal cancer 11/06/2017   Encounter for well woman exam  with routine gynecological exam 11/06/2017   High cholesterol 04/25/2016   Dysphagia 04/25/2016   Esophageal dysphagia 04/25/2016   Mixed stress and urge urinary incontinence 09/09/2015   REFERRING DIAG: M03.491 (ICD-10-CM) - Levator spasm   THERAPY DIAG:  No diagnosis found.   Rationale for Evaluation and Treatment Rehabilitation   ONSET DATE: 2021   SUBJECTIVE:                                                                                                                                                                                            SUBJECTIVE STATEMENT: I am still sitting on a hard ball. MD touched a few areas that hurt and all feels it in the rectal area.     PAIN:  Are you having pain? No   PRECAUTIONS: None   WEIGHT BEARING RESTRICTIONS No   FALLS:  Has patient fallen in last 6 months? No   LIVING ENVIRONMENT: Lives with: lives with their family     OCCUPATION: retired; no exercise   PLOF: Independent   PATIENT GOALS rectum to feel normal   PERTINENT HISTORY:  C-section; bil. Hips replaced; lumbar laminectomy;    BOWEL MOVEMENT Pain with bowel movement: No Type of bowel movement:Type (Bristol Stool Scale) type 4 at 3 inches, Frequency daily for several times, Strain No, and Splinting no Fully empty rectum: No, uses the squatty potty Leakage: No Fiber supplement: Yes: miralax, metamucil   URINATION Pain with urination: No Fully empty bladder: Yes:   Stream: Strong Urgency: No Frequency: Day time voids 3-4.  Nocturia: 0 times per night to void Leakage: Urge to void, Walking to the bathroom, Coughing, and Sneezing, not leakage for the past week Pads: 1 liners/ mini-pads per day   INTERCOURSE not sexually active   PREGNANCY C-section deliveries 1 still born           OBJECTIVE:    DIAGNOSTIC FINDINGS:  Pelvic floor strength I/V, puborectalis III/V external anal sphincter IV/V  Pelvic floor musculature: + tension in puborectalis on  rectal exam,  On vaginal exam, Right levator tender, Right obturator tender, Left levator tender, Left obturator tender PVR of 31 ml was obtained by bladder scan     COGNITION:            Overall cognitive status: Within functional limits for tasks assessed                          SENSATION:            Light touch: Appears intact            Proprioception: Appears intact                    POSTURE: No Significant postural limitations               PELVIC ALIGNMENT: Bilateral ASIS are equal   LUMBARAROM/PROM not assessed due to having an epidural this morning.      LOWER EXTREMITY ROM: Within functional limits     LOWER EXTREMITY MMT: bilateral hip strength is 4/5      PALPATION:   General  Patient over contracts the upper abdominals and does not contract the lower abdominals.                  External Perineal Exam vaginal dryness, tightness along the perineal body and superior transverse                             Internal Pelvic Floor tenderness located in the left puborectalis, bilateral obturator internist; tightness in bilateral iliococcygeus, puborectalis,    Patient confirms identification and approves PT to assess internal pelvic floor and treatment Yes   PELVIC MMT:   MMT eval 01/03/2022  Vaginal 2/5 anterior/posterior; 1/5 laterally 2/5 with circular contraction holding for 2 seconds; at times she will bulge her pelvic floor  Internal Anal Sphincter 3/5    External Anal Sphincter 3/5    Puborectalis 2/5    (Blank rows = not tested)         TONE: increased   PROLAPSE: none   TODAY'S TREATMENT  01/11/2022 Manual: Internal pelvic floor techniques:No emotional/communication barriers or cognitive limitation. Patient is motivated to learn. Patient understands and agrees with treatment goals and plan. PT explains patient will be examined in standing, sitting, and lying down to see how their muscles and joints work. When they are ready, they will be asked to  remove their underwear so PT can examine their perineum. The patient is also given the option of providing their own chaperone as one is not provided in our facility. The patient also has the right and is explained the right to defer or refuse any part of the evaluation or treatment including the internal exam. With the patient's consent, PT will use one gloved finger to gently assess the muscles of the pelvic floor, seeing how well it contracts and relaxes and if there is muscle symmetry. After, the patient will get dressed and PT and patient will discuss exam findings and plan of care. PT and patient discuss plan of care, schedule, attendance policy and HEP activities.  Going through the anus working on the levator ani, puborectalis, coccygeus, anococcygeal ligament with one finger internally and other externally, distraction of the coccyx Educated patient on how to use her pelvic floor wand through the rectum to massage the muscles in the pelvic floor 3 x per week and how to work  on trigger points Neuromuscular re-education: Down training:diaphragmatic breathing in sidely then breathing out to push therapist finger out of the rectum and had patient to practice in sitting to prepare her for pushing stool out    01/03/2022 Manual: Myofascial release:release of the urogenital diaphragm Internal pelvic floor techniques:No emotional/communication barriers or cognitive limitation. Patient is motivated to learn. Patient understands and agrees with treatment goals and plan. PT explains patient will be examined in standing, sitting, and lying down to see how their muscles and joints work. When they are ready, they will be asked to remove their underwear so PT can examine their perineum. The patient is also given the option of providing their own chaperone as one is not provided in our facility. The patient also has the right and is explained the right to defer or refuse any part of the evaluation or treatment  including the internal exam. With the patient's consent, PT will use one gloved finger to gently assess the muscles of the pelvic floor, seeing how well it contracts and relaxes and if there is muscle symmetry. After, the patient will get dressed and PT and patient will discuss exam findings and plan of care. PT and patient discuss plan of care, schedule, attendance policy and HEP activities.  Manual work on the [employer], along the ATLA, obturator internist internally One finger internally and other externally on the urogenital diaphragm working on the tissue Neuromuscular re-education: Pelvic floor contraction training:one finger in the vaginal canal taping on the sides of the introitus to work on the lateral contraction and give tactile cues to contract instead of bulging Supine hip flexion isometric to engage the lower abdomen 5 x each side hold 5 sec Supine ball squeeze with pelvic floor contraction keeping the gluteals relaxed Down training:diaphragmatic breathing in supine Exercises: Stretches/mobility:supine hamstring stretch holding for 30 sec bil.  Butterfly stretch holding for 30 sec.  Cat cow with tactile cues      PATIENT EDUCATION:  01/11/2022 Education details: Access Code: [email or web address], diaphragmatic breathing to push stool out of the rectum, how to use the wand to massage the pelvic floor muscles.  Person educated: Patient Education method: Explanation Education comprehension: verbalized understanding     HOME EXERCISE PROGRAM: Access Code: [email or web address] URL: [email or web address] Date: 01/03/2022 Prepared by: Earlie Counts   Exercises - Hooklying Isometric Hip Flexion  - 1 x daily - 7 x weekly - 1 sets - 5 reps - 5 sec hold - Supine Hip Adduction Isometric with Ball  - 1 x daily - 7 x weekly - 1 sets - 10 reps - 4 sec hold - Supine Hamstring Stretch  - 1 x daily - 7 x weekly - 1 sets - 2 reps - 30 sec hold - Supported Occupational psychologist with Pelvic Floor  Relaxation  - 1 x daily - 7 x weekly - 1 sets - 1 reps - 30 sec hold     ASSESSMENT:   CLINICAL IMPRESSION: Patient is a 71 y.o. female who was seen today for physical therapy  treatment for levator spasm.   Patient had several trigger points in the levator ani and coccygeus muscles. She was able to push the therapist finger out of the rectum after several times trying. Patient will benefit from skilled therapy to improve pelvic floor coordination to relax the pelvic floor and have a full bowel movement and reduce urinary leakage.      OBJECTIVE IMPAIRMENTS decreased coordination, decreased endurance, increased fascial restrictions, increased muscle spasms, and impaired  tone.    ACTIVITY LIMITATIONS toileting   PARTICIPATION LIMITATIONS: community activity   PERSONAL FACTORS Fitness and 3+ comorbidities: C-section; bil. Hips replaced; lumbar laminectomy  are also affecting patient's functional outcome.    REHAB POTENTIAL: Excellent   CLINICAL DECISION MAKING: Stable/uncomplicated   EVALUATION COMPLEXITY: Low     GOALS: Goals reviewed with patient? Yes   SHORT TERM GOALS: Target date: 01/23/2022   Patient independent with the use of the pelvic wand to work on the pelvic floor tissue through the anus and vagina.  Baseline: Goal status: met 01/03/2022   2.  Patient able to perform diaphragmatic breathing in sitting to relax the pelvic floor.  Baseline:  Goal status: ongoing 01/11/2022   3.  Patient reports the tightness in the pelvic floor has reduced >/= 25% due to reduction of the trigger points.  Baseline:  Goal status: ongoing 01/11/2022       LONG TERM GOALS: Target date: 03/20/2022    Patient is independent with her HEP to relax the pelvic floor and strengthen.  Baseline:  Goal status: INITIAL   2.  Patient reports no urinary leakage due to pelvic floor strength is >/= 3/5.  Baseline:  Goal status: INITIAL   3.  Patient is able to push the full stool our and not  feel she has some remaining due to the ability to fully relax her pelvic floor.  Baseline:  Goal status: INITIAL   4.  Patient is able to sit without feeling a band or ball in the anal region due to reduction of tightness in the pelvic floor  Baseline:  Goal status: INITIAL     PLAN: PT FREQUENCY: 1x/week   PT DURATION: 12 weeks   PLANNED INTERVENTIONS: Therapeutic exercises, Therapeutic activity, Neuromuscular re-education, Patient/Family education, Self Care, Joint mobilization, Dry Needling, Electrical stimulation, Spinal mobilization, Cryotherapy, Moist heat, Biofeedback, and Manual therapy   PLAN FOR NEXT SESSION: manual work to the pelvic floor vaginally going through vaginally and rectally,  diaphragmatic breathing to do pelvic floor drop,pelvic floor contraction with not contracting the gluteal, manual work to the [employer], PT 01/11/22 12:27 PM

## 2022-01-12 ENCOUNTER — Emergency Department (HOSPITAL_COMMUNITY)
Admission: EM | Admit: 2022-01-12 | Discharge: 2022-01-12 | Disposition: A | Discharge status: Home / Self Care | Payer: Medicare Other | Attending: Emergency Medicine | Admitting: Emergency Medicine

## 2022-01-12 ENCOUNTER — Other Ambulatory Visit: Payer: Self-pay

## 2022-01-12 ENCOUNTER — Emergency Department (HOSPITAL_COMMUNITY): Payer: Medicare Other

## 2022-01-12 ENCOUNTER — Encounter (HOSPITAL_COMMUNITY): Payer: Self-pay | Admitting: *Deleted

## 2022-01-12 DIAGNOSIS — M25552 Pain in left hip: Secondary | ICD-10-CM | POA: Diagnosis not present

## 2022-01-12 DIAGNOSIS — W19XXXA Unspecified fall, initial encounter: Secondary | ICD-10-CM

## 2022-01-12 DIAGNOSIS — W010XXA Fall on same level from slipping, tripping and stumbling without subsequent striking against object, initial encounter: Secondary | ICD-10-CM | POA: Diagnosis not present

## 2022-01-12 MED ORDER — HYDROCODONE-ACETAMINOPHEN 5-325 MG PO TABS: 1.0000 | ORAL_TABLET | Freq: Four times a day (QID) | ORAL | 0 refills | Status: DC | PRN

## 2022-01-12 MED ORDER — METHOCARBAMOL 1000 MG/10ML IJ SOLN
500.0000 mg | Freq: Once | INTRAMUSCULAR | Status: AC
  Administered 2022-01-12: 500 mg via INTRAMUSCULAR
  Filled 2022-01-12: qty 5

## 2022-01-12 MED ORDER — TIZANIDINE HCL 4 MG PO TABS: 4.0000 mg | ORAL_TABLET | Freq: Four times a day (QID) | ORAL | 0 refills | Status: DC | PRN

## 2022-01-12 NOTE — ED Triage Notes (Signed)
Pt states she lost her balance and fell yesterday landing on her left side; pt states she is unable to put any weight on it

## 2022-01-12 NOTE — ED Provider Notes (Signed)
[facility] EMERGENCY DEPARTMENT Provider Note   CSN: [id number] Arrival date & time: 01/12/22  7616     History  Chief Complaint  Patient presents with   Cynthia Cochran    Cynthia Cochran is a 71 y.o. female.  Room 5 but Pains are acute on   Fall  Patient presents with left hip/thigh pain.  Fell yesterday.  Mechanical following that her left leg does not work as well since she has had back surgery.  Uses a walker at baseline.  States she has numbness and weakness in the leg.  States that used muscle relaxer yesterday and worsening pain today.  Difficulty raising the leg which is not all that unusual for her.  No back pain.  Pain had a fall but negative x-ray he is on Zanaflex at baseline but she just finished but I did give her a shot of Toradol to help looks like she is got bad ulcers I did some IV Robaxin instead or IM Robaxin instead of trying to help but Toradol tends to work great for most people    Past Medical History:  Diagnosis Date   Anxiety    Arthritis    Blood transfusion without reported diagnosis 1982   after c section   Dysphagia 04/25/2016   Gastric ulcer    GERD (gastroesophageal reflux disease)    Heart murmur    High cholesterol 04/25/2016   Hyperlipidemia    Mixed stress and urge urinary incontinence 09/09/2015   Sciatic nerve pain    Spinal stenosis     Home Medications Prior to Admission medications   Medication Sig Start Date End Date Taking? Authorizing Provider  acetaminophen (TYLENOL) 650 MG CR tablet Take 1,300 mg by mouth every 8 (eight) hours as needed for pain.    [provider]  atorvastatin (LIPITOR) 80 MG tablet Take 80 mg by mouth at bedtime.  12/14/11   [provider]  Cholecalciferol (DIALYVITE VITAMIN D 5000) 125 MCG (5000 UT) capsule Take 5,000 Units by mouth daily.    [provider]  COLLAGEN PO Take 1 capsule by mouth daily.    [provider]  HYDROcodone-acetaminophen (NORCO/VICODIN) 5-325 MG tablet  Take 1 tablet by mouth 3 (three) times daily as needed for moderate pain.    [provider]  losartan (COZAAR) 50 MG tablet Take 1 tablet (50 mg total) by mouth daily. 01/03/22   [name], MD  polyethylene glycol (MIRALAX / GLYCOLAX) 17 g packet Take 17 g by mouth daily. One capful daily in coffee    [provider]  Psyllium (METAMUCIL PO) Take 1 Dose by mouth daily. 1 dose = 2 teaspoons    [provider]  Specialty Vitamins Products (BIOTIN PLUS KERATIN PO) Take 1 capsule by mouth daily.    [provider]  tiZANidine (ZANAFLEX) 4 MG tablet Take 1 tablet (4 mg total) by mouth every 6 (six) hours as needed for muscle spasms. 01/12/22   Benjiman Core, MD  Turmeric Curcumin 500 MG CAPS Take 500 mg by mouth daily.    [provider]      Allergies    Lyrica [pregabalin], Mobic [meloxicam], and Nsaids    Review of Systems   Review of Systems  Physical Exam Updated Vital Signs BP (!) 175/86 (BP Location: Right Arm)   Pulse (!) 54   Temp 98.3 F (36.8 C) (Oral)   Resp 18   Ht 5\' 2"  (1.575 m)   Wt 68 kg  SpO2 98%   BMI 27.44 kg/m  Physical Exam Vitals and nursing note reviewed.  Cardiovascular:     Rate and Rhythm: Regular rhythm.  Musculoskeletal:     Cervical back: Neck supple. No tenderness.     Comments: Some tenderness to left hip laterally.  Good range of motion.  No ecchymosis.  Difficulty with straight leg raise actively but passively does have good raise but more pain with putting it down.  No new numbness or weakness.  Neurological:     Mental Status: She is alert.     ED Results / Procedures / Treatments   Labs (all labs ordered are listed, but only abnormal results are displayed) Labs Reviewed - No data to display  EKG None  Radiology DG Hip Unilat W or Wo Pelvis 2-3 Views Left  Result Date: 01/12/2022 CLINICAL DATA:  Fall yesterday with pain, history of bilateral hip replacements. EXAM: DG HIP (WITH OR  WITHOUT PELVIS) 2-3V LEFT COMPARISON:  Pelvis radiographs 08/30/2017 FINDINGS: Postsurgical changes reflecting bilateral hip arthroplasties are seen. The inferior portion of the right hardware is not included within the field of view. No periprosthetic fracture or dislocation is seen. The SI joints and symphysis pubis are intact. A probable calcified uterine fibroid is again seen. IMPRESSION: Status post bilateral hip arthroplasty without evidence of acute fracture or dislocation. Electronically Signed   By: Valetta Mole M.D.   On: 01/12/2022 08:19    Procedures Procedures    Medications Ordered in ED Medications  methocarbamol (ROBAXIN) injection 500 mg (has no administration in time range)    ED Course/ Medical Decision Making/ A&P                           Medical Decision Making Amount and/or Complexity of Data Reviewed Radiology: ordered.  Risk Prescription drug management.   Patient with fall.  Left hip pain.  Previous hip replacement.  Also chronic weakness and numbness on the side due to previous back surgery.  Tenderness laterally.  No deformity.  No abdominal tenderness.  We will get x-ray to evaluate for potential fracture.  Doubt mid to distal fracture on the thigh since there is no deformity.  X-ray independently interpreted shows no fracture.  Continued pain.  Initial plan was to give some Toradol to help with pain, however has had history of ulcers.  We will give IM Robaxin.  Also ran out of her Zanaflex and will refill for her.  Outpatient follow-up as needed.  Discussed possibility of occult fracture but felt less likely.        Final Clinical Impression(s) / ED Diagnoses Final diagnoses:  Fall, initial encounter  Pain of left hip    Rx / DC Orders ED Discharge Orders          Ordered    tiZANidine (ZANAFLEX) 4 MG tablet  Every 6 hours PRN        01/12/22 1006              Davonna Belling, MD 01/12/22 1008

## 2022-01-12 NOTE — Discharge Instructions (Addendum)
Follow-up with your doctors.  Your x-ray was reassuring.  If symptoms do not prove may need further work-up.

## 2022-01-13 DIAGNOSIS — R41840 Attention and concentration deficit: Secondary | ICD-10-CM | POA: Diagnosis not present

## 2022-01-13 DIAGNOSIS — F411 Generalized anxiety disorder: Secondary | ICD-10-CM | POA: Diagnosis not present

## 2022-01-13 DIAGNOSIS — F331 Major depressive disorder, recurrent, moderate: Secondary | ICD-10-CM | POA: Diagnosis not present

## 2022-01-16 ENCOUNTER — Institutional Professional Consult (permissible substitution): Payer: Medicare Other

## 2022-01-16 ENCOUNTER — Ambulatory Visit: Payer: Medicare Other | Admitting: Cardiovascular Disease

## 2022-01-17 DIAGNOSIS — Z23 Encounter for immunization: Secondary | ICD-10-CM | POA: Diagnosis not present

## 2022-01-18 ENCOUNTER — Telehealth: Payer: Self-pay | Admitting: Plastic Surgery

## 2022-01-18 ENCOUNTER — Encounter: Payer: Self-pay | Admitting: Obstetrics and Gynecology

## 2022-01-18 ENCOUNTER — Ambulatory Visit (INDEPENDENT_AMBULATORY_CARE_PROVIDER_SITE_OTHER): Payer: Medicare Other | Admitting: Obstetrics and Gynecology

## 2022-01-18 VITALS — BP 183/82 | HR 58

## 2022-01-18 DIAGNOSIS — R102 Pelvic and perineal pain: Secondary | ICD-10-CM

## 2022-01-18 DIAGNOSIS — M62838 Other muscle spasm: Secondary | ICD-10-CM

## 2022-01-18 MED ORDER — TRIAMCINOLONE ACETONIDE 40 MG/ML IJ SUSP
40.0000 mg | Freq: Once | INTRAMUSCULAR | Status: AC
  Administered 2022-01-18: 40 mg via INTRAMUSCULAR

## 2022-01-18 MED ORDER — BUPIVACAINE HCL (PF) 0.25 % IJ SOLN
9.0000 mL | Freq: Once | INTRAMUSCULAR | Status: AC
  Administered 2022-01-18: 9 mL

## 2022-01-18 NOTE — Telephone Encounter (Signed)
Tr explained PA had pictures of botox she said they discussed not doing it - she does not n=think she had it

## 2022-01-18 NOTE — Addendum Note (Signed)
Addended by: Salina April on: 01/18/2022 02:34 PM   Modules accepted: Orders

## 2022-01-18 NOTE — Progress Notes (Signed)
Ms. SADIE HAZELETT is a 71 y.o. female who presents for levator trigger point injection. Series #5  Vitals:   01/18/22 0818  BP: (!) 183/82  Pulse: (!) 58    Had a fall last week and went to ER- nothing broken. Has been taking muscle relaxants more regularly since then.   Indication(s): Levator spasm, perineal pain.   Informed Consent:  The alternatives, risks and benefits of the procedure were explained to the patient. Risks including, but not limited to discomfort, pain, bleeding, infection, injury to nearby structures, inability to perform the procedure, failure of the procedure were discussed.  All questions were answered and the patient elected to proceed.  Procedure:   The patient was positioned in dorsal lithotomy position.  The vaginal tissues were prepped with Hibiclens solution.  An injection of 10cc of a mixture of 90% anesthetic (0.25% Bupivacaine) and 10% 40mg /ml Triamcinolone acetomide (Kenalog) was performed into puborectalis through the vagina in 2 locations. The ischeal spine was palpated, and 3cc of the solution was injected inferiorly for a pudendal nerve block, performed bilaterally. Pressure was held over bleeding areas until good hemostasis was achieved.   The patient tolerated the procedure well with no apparent complications. Return 1 week for repeat injection.   , MD

## 2022-01-19 ENCOUNTER — Encounter: Payer: Self-pay | Admitting: Physical Therapy

## 2022-01-19 ENCOUNTER — Ambulatory Visit: Payer: Self-pay | Admitting: Physical Therapy

## 2022-01-19 DIAGNOSIS — R278 Other lack of coordination: Secondary | ICD-10-CM

## 2022-01-19 DIAGNOSIS — R252 Cramp and spasm: Secondary | ICD-10-CM | POA: Diagnosis not present

## 2022-01-19 NOTE — Therapy (Signed)
OUTPATIENT PHYSICAL THERAPY TREATMENT NOTE   Patient Name: Cynthia Cochran MRN: [id number] DOB:10-09-1950, 71 y.o., female Today's Date: 01/19/2022  PCP: Celene Squibb, MD  REFERRING PROVIDER:  Jaquita Folds, MD   END OF SESSION:   PT End of Session - 01/19/22 1036     Visit Number 4    Date for PT Re-Evaluation 03/20/22    Authorization Type Medicare    Authorization - Visit Number 4    Authorization - Number of Visits 10    PT Start Time 1030    PT Stop Time 1125    PT Time Calculation (min) 55 min    Activity Tolerance Patient tolerated treatment well    Behavior During Therapy [facility] for tasks assessed/performed             Past Medical History:  Diagnosis Date   Anxiety    Arthritis    Blood transfusion without reported diagnosis 1982   after c section   Dysphagia 04/25/2016   Gastric ulcer    GERD (gastroesophageal reflux disease)    Heart murmur    High cholesterol 04/25/2016   Hyperlipidemia    Mixed stress and urge urinary incontinence 09/09/2015   Sciatic nerve pain    Spinal stenosis    Past Surgical History:  Procedure Laterality Date   BIOPSY  05/12/2016   Procedure: BIOPSY;  Surgeon: Rogene Houston, MD;  [facility];  Service: Endoscopy;;  gastric   BIOPSY  10/04/2018   Procedure: BIOPSY;  Surgeon: Rogene Houston, MD;  [facility];  Service: Endoscopy;;  gastric    CARPAL TUNNEL RELEASE     right; 1998, left [place]   COLONOSCOPY WITH PROPOFOL N/A 10/04/2018   Procedure: COLONOSCOPY WITH PROPOFOL;  Surgeon: Rogene Houston, MD;  [facility]: [facility];  Service: Endoscopy;  Laterality: N/A;  7:30   Colonscopy     ESOPHAGEAL DILATION N/A 05/12/2016   Procedure: ESOPHAGEAL DILATION;  Surgeon: Rogene Houston, MD;  [facility];  Service: Endoscopy;  Laterality: N/A;   ESOPHAGOGASTRODUODENOSCOPY N/A 05/12/2016   Procedure: ESOPHAGOGASTRODUODENOSCOPY (EGD);  Surgeon: Rogene Houston, MD;   [facility];  Service: Endoscopy;  Laterality: N/A;  9:30   ESOPHAGOGASTRODUODENOSCOPY (EGD) WITH PROPOFOL N/A 10/04/2018   Procedure: ESOPHAGOGASTRODUODENOSCOPY (EGD) WITH PROPOFOL;  Surgeon: Rogene Houston, MD;  [facility];  Service: Endoscopy;  Laterality: N/A;   EYE SURGERY     lasix   FLEXIBLE SIGMOIDOSCOPY N/A 01/28/2021   Procedure: FLEXIBLE SIGMOIDOSCOPY WITH PROPOFOL;  Surgeon: Rogene Houston, MD;  [facility];  Service: Endoscopy;  Laterality: N/A;  9:30   JOINT REPLACEMENT     both hips   LUMBAR LAMINECTOMY/DECOMPRESSION MICRODISCECTOMY N/A 08/05/2020   Procedure: LAMINECTOMY THORACIC TEN-ELEVEN;  Surgeon: Ashok Pall, MD;  Location: [place];  Service: Neurosurgery;  Laterality: N/A;   LUMBAR LAMINECTOMY/DECOMPRESSION MICRODISCECTOMY N/A 10/14/2020   Procedure: Right Lumbar 4-5, Lumbar 5 Sacral 1 Lumbar and thoracic laminectomy;  Surgeon: Ashok Pall, MD;  Location: [place];  Service: Neurosurgery;  Laterality: N/A;  Right Lumbar 4-5, Lumbar 5 Sacral 1 Lumbar and thoracic laminectomy   TOTAL HIP ARTHROPLASTY     right; March 2013; left March 2011   Patient Active Problem List   Diagnosis Date Noted   Encounter for counseling 01/09/2022   Pelvic relaxation due to cystocele, midline 04/15/2021   Postmenopausal 04/15/2021   Rectal pain  04/05/2021   Anal fissure 12/21/2020   Constipation 08/12/2020   Thoracic spinal stenosis 08/05/2020   Gait disturbance 11/04/2018   Numbness and tingling 11/04/2018   Lumbar radiculopathy 11/04/2018   Cervical stenosis of spinal canal 11/04/2018   PUD (peptic ulcer disease) 05/10/2018   Anemia 05/10/2018   Special screening for malignant neoplasms, colon 05/10/2018   Abdominal pain 04/07/2018   Hypokalemia 04/07/2018   Osteoarthritis 04/07/2018   Elevated blood pressure reading 04/07/2018   Family hx of colon cancer 02/27/2018   Screening for colorectal cancer 11/06/2017   Encounter for well woman exam  with routine gynecological exam 11/06/2017   High cholesterol 04/25/2016   Dysphagia 04/25/2016   Esophageal dysphagia 04/25/2016   Mixed stress and urge urinary incontinence 09/09/2015   REFERRING DIAG: Y61.683 (ICD-10-CM) - Levator spasm   THERAPY DIAG:  No diagnosis found.   Rationale for Evaluation and Treatment Rehabilitation   ONSET DATE: 2021   SUBJECTIVE:                                                                                                                                                                                            SUBJECTIVE STATEMENT: I fell last Wednesday and went to the ER. No broken bones.  I am not sure why I fell. I have been taking the muscle relaxer's due to the fall.   PAIN:  Are you having pain? No   PRECAUTIONS: None   WEIGHT BEARING RESTRICTIONS No   FALLS:  Has patient fallen in last 6 months? No   LIVING ENVIRONMENT: Lives with: lives with their family     OCCUPATION: retired; no exercise   PLOF: Independent   PATIENT GOALS rectum to feel normal   PERTINENT HISTORY:  C-section; bil. Hips replaced; lumbar laminectomy;    BOWEL MOVEMENT Pain with bowel movement: No Type of bowel movement:Type (Bristol Stool Scale) type 4 at 3 inches, Frequency daily for several times, Strain No, and Splinting no Fully empty rectum: No, uses the squatty potty Leakage: No Fiber supplement: Yes: miralax, metamucil   URINATION Pain with urination: No Fully empty bladder: Yes:   Stream: Strong Urgency: No Frequency: Day time voids 3-4.  Nocturia: 0 times per night to void Leakage: Urge to void, Walking to the bathroom, Coughing, and Sneezing, not leakage for the past week Pads: 1 liners/ mini-pads per day   INTERCOURSE not sexually active   PREGNANCY C-section deliveries 1 still born        OBJECTIVE: (objective measures completed at initial evaluation unless otherwise dated)   (Copy Eval's Objective through Plan section  here)  DIAGNOSTIC FINDINGS:  Pelvic floor strength I/V, puborectalis III/V external anal sphincter IV/V  Pelvic floor musculature: + tension in puborectalis on rectal exam, On vaginal exam, Right levator tender, Right obturator tender, Left levator tender, Left obturator tender PVR of 31 ml was obtained by bladder scan 01/12/2022 x-ray showed a utering fibroid that is hardened.       COGNITION:            Overall cognitive status: Within functional limits for tasks assessed                          SENSATION:            Light touch: Appears intact            Proprioception: Appears intact                    POSTURE: No Significant postural limitations               PELVIC ALIGNMENT: Bilateral ASIS are equal   LUMBARAROM/PROM not assessed due to having an epidural this morning.      LOWER EXTREMITY ROM: Within functional limits     LOWER EXTREMITY MMT: bilateral hip strength is 4/5      PALPATION:   General  Patient over contracts the upper abdominals and does not contract the lower abdominals.                  External Perineal Exam vaginal dryness, tightness along the perineal body and superior transverse                             Internal Pelvic Floor tenderness located in the left puborectalis, bilateral obturator internist; tightness in bilateral iliococcygeus, puborectalis,    Patient confirms identification and approves PT to assess internal pelvic floor and treatment Yes   PELVIC MMT:   MMT eval 01/03/2022  Vaginal 2/5 anterior/posterior; 1/5 laterally 2/5 with circular contraction holding for 2 seconds; at times she will bulge her pelvic floor  Internal Anal Sphincter 3/5    External Anal Sphincter 3/5    Puborectalis 2/5    (Blank rows = not tested)         TONE: increased   PROLAPSE: none   TODAY'S TREATMENT  01/19/2022 Manual: Internal pelvic floor techniques: No emotional/communication barriers or cognitive limitation. Patient is motivated to  learn. Patient understands and agrees with treatment goals and plan. PT explains patient will be examined in standing, sitting, and lying down to see how their muscles and joints work. When they are ready, they will be asked to remove their underwear so PT can examine their perineum. The patient is also given the option of providing their own chaperone as one is not provided in our facility. The patient also has the right and is explained the right to defer or refuse any part of the evaluation or treatment including the internal exam. With the patient's consent, PT will use one gloved finger to gently assess the muscles of the pelvic floor, seeing how well it contracts and relaxes and if there is muscle symmetry. After, the patient will get dressed and PT and patient will discuss exam findings and plan of care. PT and patient discuss plan of care, schedule, attendance policy and HEP activities.  One finger in the anal canal and other in the vaginal canal working on the levator ani and anococcygeal ligament  feeling for the releases and elongating the muscles in left sidely Self-care: Therapist explained to patient what a uterine fibroid is and how it can affect the pelvic floor    01/11/2022 Manual: Internal pelvic floor techniques:No emotional/communication barriers or cognitive limitation. Patient is motivated to learn. Patient understands and agrees with treatment goals and plan. PT explains patient will be examined in standing, sitting, and lying down to see how their muscles and joints work. When they are ready, they will be asked to remove their underwear so PT can examine their perineum. The patient is also given the option of providing their own chaperone as one is not provided in our facility. The patient also has the right and is explained the right to defer or refuse any part of the evaluation or treatment including the internal exam. With the patient's consent, PT will use one gloved finger to  gently assess the muscles of the pelvic floor, seeing how well it contracts and relaxes and if there is muscle symmetry. After, the patient will get dressed and PT and patient will discuss exam findings and plan of care. PT and patient discuss plan of care, schedule, attendance policy and HEP activities.  Going through the anus working on the levator ani, puborectalis, coccygeus, anococcygeal ligament with one finger internally and other externally, distraction of the coccyx Educated patient on how to use her pelvic floor wand through the rectum to massage the muscles in the pelvic floor 3 x per week and how to work on trigger points Neuromuscular re-education: Down training:diaphragmatic breathing in sidely then breathing out to push therapist finger out of the rectum and had patient to practice in sitting to prepare her for pushing stool out    01/03/2022 Manual: Myofascial release:release of the urogenital diaphragm Internal pelvic floor techniques:No emotional/communication barriers or cognitive limitation. Patient is motivated to learn. Patient understands and agrees with treatment goals and plan. PT explains patient will be examined in standing, sitting, and lying down to see how their muscles and joints work. When they are ready, they will be asked to remove their underwear so PT can examine their perineum. The patient is also given the option of providing their own chaperone as one is not provided in our facility. The patient also has the right and is explained the right to defer or refuse any part of the evaluation or treatment including the internal exam. With the patient's consent, PT will use one gloved finger to gently assess the muscles of the pelvic floor, seeing how well it contracts and relaxes and if there is muscle symmetry. After, the patient will get dressed and PT and patient will discuss exam findings and plan of care. PT and patient discuss plan of care, schedule, attendance policy  and HEP activities.  Manual work on the [employer], along the ATLA, obturator internist internally One finger internally and other externally on the urogenital diaphragm working on the tissue Neuromuscular re-education: Pelvic floor contraction training:one finger in the vaginal canal taping on the sides of the introitus to work on the lateral contraction and give tactile cues to contract instead of bulging Supine hip flexion isometric to engage the lower abdomen 5 x each side hold 5 sec Supine ball squeeze with pelvic floor contraction keeping the gluteals relaxed Down training:diaphragmatic breathing in supine Exercises: Stretches/mobility:supine hamstring stretch holding for 30 sec bil.  Butterfly stretch holding for 30 sec.  Cat cow with tactile cues       PATIENT EDUCATION:  01/19/2022  Education details: Access Code: [email or web address], educated patient on what a uterine fibroid is and how it can effect the pelvic floor Person educated: Patient Education method: Explanation Education comprehension: verbalized understanding     HOME EXERCISE PROGRAM: Access Code: [email or web address] URL: [email or web address] Date: 01/03/2022 Prepared by: Earlie Counts   Exercises - Hooklying Isometric Hip Flexion  - 1 x daily - 7 x weekly - 1 sets - 5 reps - 5 sec hold - Supine Hip Adduction Isometric with Ball  - 1 x daily - 7 x weekly - 1 sets - 10 reps - 4 sec hold - Supine Hamstring Stretch  - 1 x daily - 7 x weekly - 1 sets - 2 reps - 30 sec hold - Supported Occupational psychologist with Pelvic Floor Relaxation  - 1 x daily - 7 x weekly - 1 sets - 1 reps - 30 sec hold     ASSESSMENT:   CLINICAL IMPRESSION: Patient is a 71 y.o. female who was seen today for physical therapy  treatment for levator spasm.   Patient does not feel like she is sitting on a ball instead the tightness has dispersed. She fell last week and found out she has a uterine fibroid on x-ray. Patient had several trigger points but  the muscles felt pillowy after manual wwork. She felt less tightness around the rectum after manual work. . Patient will benefit from skilled therapy to improve pelvic floor coordination to relax the pelvic floor and have a full bowel movement and reduce urinary leakage.      OBJECTIVE IMPAIRMENTS decreased coordination, decreased endurance, increased fascial restrictions, increased muscle spasms, and impaired tone.    ACTIVITY LIMITATIONS toileting   PARTICIPATION LIMITATIONS: community activity   PERSONAL FACTORS Fitness and 3+ comorbidities: C-section; bil. Hips replaced; lumbar laminectomy  are also affecting patient's functional outcome.    REHAB POTENTIAL: Excellent   CLINICAL DECISION MAKING: Stable/uncomplicated   EVALUATION COMPLEXITY: Low     GOALS: Goals reviewed with patient? Yes   SHORT TERM GOALS: Target date: 01/23/2022   Patient independent with the use of the pelvic wand to work on the pelvic floor tissue through the anus and vagina.  Baseline: Goal status: met 01/03/2022   2.  Patient able to perform diaphragmatic breathing in sitting to relax the pelvic floor.  Baseline:  Goal status: ongoing 01/11/2022   3.  Patient reports the tightness in the pelvic floor has reduced >/= 25% due to reduction of the trigger points.  Baseline:  Goal status: ongoing 01/11/2022       LONG TERM GOALS: Target date: 03/20/2022    Patient is independent with her HEP to relax the pelvic floor and strengthen.  Baseline:  Goal status: INITIAL   2.  Patient reports no urinary leakage due to pelvic floor strength is >/= 3/5.  Baseline:  Goal status: INITIAL   3.  Patient is able to push the full stool our and not feel she has some remaining due to the ability to fully relax her pelvic floor.  Baseline:  Goal status: INITIAL   4.  Patient is able to sit without feeling a band or ball in the anal region due to reduction of tightness in the pelvic floor  Baseline:  Goal status:  INITIAL     PLAN: PT FREQUENCY: 1x/week   PT DURATION: 12 weeks   PLANNED INTERVENTIONS: Therapeutic exercises, Therapeutic activity, Neuromuscular re-education, Patient/Family education, Self Care, Joint mobilization, Dry Needling, Electrical stimulation, Spinal mobilization, Cryotherapy, Moist heat,  Biofeedback, and Manual therapy   PLAN FOR NEXT SESSION: manual work to the pelvic floor vaginally going through vaginally and rectally,  diaphragmatic breathing to do pelvic floor drop,pelvic floor contraction with not contracting the gluteal, manual work to the [employer], PT 01/19/22 11:30 AM

## 2022-01-20 DIAGNOSIS — W19XXXA Unspecified fall, initial encounter: Secondary | ICD-10-CM | POA: Diagnosis not present

## 2022-01-20 DIAGNOSIS — M791 Myalgia, unspecified site: Secondary | ICD-10-CM | POA: Diagnosis not present

## 2022-01-23 ENCOUNTER — Ambulatory Visit (INDEPENDENT_AMBULATORY_CARE_PROVIDER_SITE_OTHER): Payer: Medicare Other

## 2022-01-23 DIAGNOSIS — Z719 Counseling, unspecified: Secondary | ICD-10-CM

## 2022-01-24 DIAGNOSIS — M5416 Radiculopathy, lumbar region: Secondary | ICD-10-CM | POA: Diagnosis not present

## 2022-01-24 DIAGNOSIS — Z6827 Body mass index (BMI) 27.0-27.9, adult: Secondary | ICD-10-CM | POA: Diagnosis not present

## 2022-01-26 ENCOUNTER — Encounter: Payer: Self-pay | Admitting: Physical Therapy

## 2022-01-26 ENCOUNTER — Encounter: Payer: Medicare Other | Attending: Obstetrics and Gynecology | Admitting: Physical Therapy

## 2022-01-26 DIAGNOSIS — R278 Other lack of coordination: Secondary | ICD-10-CM | POA: Insufficient documentation

## 2022-01-26 DIAGNOSIS — R252 Cramp and spasm: Secondary | ICD-10-CM | POA: Diagnosis present

## 2022-01-26 NOTE — Therapy (Signed)
OUTPATIENT PHYSICAL THERAPY TREATMENT NOTE   Patient Name: Cynthia Cochran MRN: [id number] DOB:1950-07-19, 71 y.o., female Today's Date: 01/26/2022  PCP: Celene Squibb, MD   REFERRING PROVIDER: Jaquita Folds, MD    END OF SESSION:   PT End of Session - 01/26/22 0832     Visit Number 5    Date for PT Re-Evaluation 03/20/22    Authorization Type Medicare    Authorization - Visit Number 5    Authorization - Number of Visits 10    PT Start Time 0830    PT Stop Time 0915    PT Time Calculation (min) 45 min    Activity Tolerance Patient tolerated treatment well    Behavior During Therapy [facility] for tasks assessed/performed             Past Medical History:  Diagnosis Date   Anxiety    Arthritis    Blood transfusion without reported diagnosis 1982   after c section   Dysphagia 04/25/2016   Gastric ulcer    GERD (gastroesophageal reflux disease)    Heart murmur    High cholesterol 04/25/2016   Hyperlipidemia    Mixed stress and urge urinary incontinence 09/09/2015   Sciatic nerve pain    Spinal stenosis    Past Surgical History:  Procedure Laterality Date   BIOPSY  05/12/2016   Procedure: BIOPSY;  Surgeon: Rogene Houston, MD;  [facility];  Service: Endoscopy;;  gastric   BIOPSY  10/04/2018   Procedure: BIOPSY;  Surgeon: Rogene Houston, MD;  [facility];  Service: Endoscopy;;  gastric    CARPAL TUNNEL RELEASE     right; 1998, left [place]   COLONOSCOPY WITH PROPOFOL N/A 10/04/2018   Procedure: COLONOSCOPY WITH PROPOFOL;  Surgeon: Rogene Houston, MD;  [facility];  Service: Endoscopy;  Laterality: N/A;  7:30   Colonscopy     ESOPHAGEAL DILATION N/A 05/12/2016   Procedure: ESOPHAGEAL DILATION;  Surgeon: Rogene Houston, MD;  [facility];  Service: Endoscopy;  Laterality: N/A;   ESOPHAGOGASTRODUODENOSCOPY N/A 05/12/2016   Procedure: ESOPHAGOGASTRODUODENOSCOPY (EGD);  Surgeon: Rogene Houston, MD;   [facility];  Service: Endoscopy;  Laterality: N/A;  9:30   ESOPHAGOGASTRODUODENOSCOPY (EGD) WITH PROPOFOL N/A 10/04/2018   Procedure: ESOPHAGOGASTRODUODENOSCOPY (EGD) WITH PROPOFOL;  Surgeon: Rogene Houston, MD;  [facility];  Service: Endoscopy;  Laterality: N/A;   EYE SURGERY     lasix   FLEXIBLE SIGMOIDOSCOPY N/A 01/28/2021   Procedure: FLEXIBLE SIGMOIDOSCOPY WITH PROPOFOL;  Surgeon: Rogene Houston, MD;  [facility];  Service: Endoscopy;  Laterality: N/A;  9:30   JOINT REPLACEMENT     both hips   LUMBAR LAMINECTOMY/DECOMPRESSION MICRODISCECTOMY N/A 08/05/2020   Procedure: LAMINECTOMY THORACIC TEN-ELEVEN;  Surgeon: Ashok Pall, MD;  Location: [place];  Service: Neurosurgery;  Laterality: N/A;   LUMBAR LAMINECTOMY/DECOMPRESSION MICRODISCECTOMY N/A 10/14/2020   Procedure: Right Lumbar 4-5, Lumbar 5 Sacral 1 Lumbar and thoracic laminectomy;  Surgeon: Ashok Pall, MD;  Location: [place];  Service: Neurosurgery;  Laterality: N/A;  Right Lumbar 4-5, Lumbar 5 Sacral 1 Lumbar and thoracic laminectomy   TOTAL HIP ARTHROPLASTY     right; March 2013; left March 2011   Patient Active Problem List   Diagnosis Date Noted   Encounter for counseling 01/09/2022   Pelvic relaxation due to cystocele, midline 04/15/2021   Postmenopausal 04/15/2021   Rectal  pain 04/05/2021   Anal fissure 12/21/2020   Constipation 08/12/2020   Thoracic spinal stenosis 08/05/2020   Gait disturbance 11/04/2018   Numbness and tingling 11/04/2018   Lumbar radiculopathy 11/04/2018   Cervical stenosis of spinal canal 11/04/2018   PUD (peptic ulcer disease) 05/10/2018   Anemia 05/10/2018   Special screening for malignant neoplasms, colon 05/10/2018   Abdominal pain 04/07/2018   Hypokalemia 04/07/2018   Osteoarthritis 04/07/2018   Elevated blood pressure reading 04/07/2018   Family hx of colon cancer 02/27/2018   Screening for colorectal cancer 11/06/2017   Encounter for well woman exam  with routine gynecological exam 11/06/2017   High cholesterol 04/25/2016   Dysphagia 04/25/2016   Esophageal dysphagia 04/25/2016   Mixed stress and urge urinary incontinence 09/09/2015   REFERRING DIAG: W09.811 (ICD-10-CM) - Levator spasm   THERAPY DIAG:  No diagnosis found.   Rationale for Evaluation and Treatment Rehabilitation   ONSET DATE: 2021   SUBJECTIVE:                                                                                                                                                                                            SUBJECTIVE STATEMENT: Sunday I had a lot of tightness. I used the wand several days and is did not loosen it up. I see Dr. Wannetta Sender tomorrow. The area feels better after manual work but then will tense up. Before it felt like a ball but now is just tight. My left foot and leg feels it has increased in numbness. Patient still leaking urine.     PAIN:  Are you having pain? No   PRECAUTIONS: None   WEIGHT BEARING RESTRICTIONS No   FALLS:  Has patient fallen in last 6 months? No   LIVING ENVIRONMENT: Lives with: lives with their family     OCCUPATION: retired; no exercise   PLOF: Independent   PATIENT GOALS rectum to feel normal   PERTINENT HISTORY:  C-section; bil. Hips replaced; lumbar laminectomy;    BOWEL MOVEMENT Pain with bowel movement: No Type of bowel movement:Type (Bristol Stool Scale) type 4 at 3 inches, Frequency daily for several times, Strain No, and Splinting no Fully empty rectum: No, uses the squatty potty Leakage: No Fiber supplement: Yes: miralax, metamucil   URINATION Pain with urination: No Fully empty bladder: Yes:   Stream: Strong Urgency: No Frequency: Day time voids 3-4.  Nocturia: 0 times per night to void Leakage: Urge to void, Walking to the bathroom, Coughing, and Sneezing, not leakage for the past week Pads: 1 liners/ mini-pads per day   INTERCOURSE not sexually active  PREGNANCY C-section deliveries 1 still born        OBJECTIVE: (objective measures completed at initial evaluation unless otherwise dated)     (Copy Eval's Objective through Plan section here)   DIAGNOSTIC FINDINGS:  Pelvic floor strength I/V, puborectalis III/V external anal sphincter IV/V  Pelvic floor musculature: + tension in puborectalis on rectal exam, On vaginal exam, Right levator tender, Right obturator tender, Left levator tender, Left obturator tender PVR of 31 ml was obtained by bladder scan 01/12/2022 x-ray showed a utering fibroid that is hardened.        COGNITION:            Overall cognitive status: Within functional limits for tasks assessed                          SENSATION:            Light touch: Appears intact            Proprioception: Appears intact                    POSTURE: No Significant postural limitations               PELVIC ALIGNMENT: Bilateral ASIS are equal   LUMBARAROM/PROM not assessed due to having an epidural this morning.      LOWER EXTREMITY ROM: Within functional limits     LOWER EXTREMITY MMT: bilateral hip strength is 4/5      PALPATION:   General  Patient over contracts the upper abdominals and does not contract the lower abdominals.                  External Perineal Exam vaginal dryness, tightness along the perineal body and superior transverse                             Internal Pelvic Floor tenderness located in the left puborectalis, bilateral obturator internist; tightness in bilateral iliococcygeus, puborectalis,    Patient confirms identification and approves PT to assess internal pelvic floor and treatment Yes   PELVIC MMT:   MMT eval 01/03/2022 01/26/2022  Vaginal 2/5 anterior/posterior; 1/5 laterally 2/5 with circular contraction holding for 2 seconds; at times she will bulge her pelvic floor 3/5  Internal Anal Sphincter 3/5   3/5  External Anal Sphincter 3/5   3/5  Puborectalis 2/5   3/5  (Blank rows =  not tested)         TONE: increased   PROLAPSE: none   TODAY'S TREATMENT  01/26/2022 Manual: Soft tissue mobilization: Scar tissue mobilization: Myofascial release: Spinal mobilization: Internal pelvic floor techniques: No emotional/communication barriers or cognitive limitation. Patient is motivated to learn. Patient understands and agrees with treatment goals and plan. PT explains patient will be examined in standing, sitting, and lying down to see how their muscles and joints work. When they are ready, they will be asked to remove their underwear so PT can examine their perineum. The patient is also given the option of providing their own chaperone as one is not provided in our facility. The patient also has the right and is explained the right to defer or refuse any part of the evaluation or treatment including the internal exam. With the patient's consent, PT will use one gloved finger to gently assess the muscles of the pelvic floor, seeing how well it contracts and relaxes and if there  is muscle symmetry. After, the patient will get dressed and PT and patient will discuss exam findings and plan of care. PT and patient discuss plan of care, schedule, attendance policy and HEP activities.  One finger in the anus and other in the vaginal canal working on the ATLA, levator ani, anococcygeal ligament Neuromuscular re-education: Core facilitation:  Supine press hands into thighs with vaginal contraction holding for 5 sec 10x Supine ball squeeze with pelvic floor contraction holding 5 sec 10x Pelvic floor contraction training:one finger in the anus working on pelvic floor drop with diaphragmatic breathing One finger in the anus working on anal contraction and other finger in the vaginal working on the vaginal contraction Down training: diaphragmatic breathing with dropping of the pelvic floor  Standing with leaning on counter and therapist placing her index finger on the anococcygeal  ligament, patient is moving back and forth, diagonals, and mini squat to mobilize the ligament    01/19/2022 Manual: Internal pelvic floor techniques: No emotional/communication barriers or cognitive limitation. Patient is motivated to learn. Patient understands and agrees with treatment goals and plan. PT explains patient will be examined in standing, sitting, and lying down to see how their muscles and joints work. When they are ready, they will be asked to remove their underwear so PT can examine their perineum. The patient is also given the option of providing their own chaperone as one is not provided in our facility. The patient also has the right and is explained the right to defer or refuse any part of the evaluation or treatment including the internal exam. With the patient's consent, PT will use one gloved finger to gently assess the muscles of the pelvic floor, seeing how well it contracts and relaxes and if there is muscle symmetry. After, the patient will get dressed and PT and patient will discuss exam findings and plan of care. PT and patient discuss plan of care, schedule, attendance policy and HEP activities.  One finger in the anal canal and other in the vaginal canal working on the levator ani and anococcygeal ligament feeling for the releases and elongating the muscles in left sidely Self-care: Therapist explained to patient what a uterine fibroid is and how it can affect the pelvic floor    01/11/2022 Manual: Internal pelvic floor techniques:No emotional/communication barriers or cognitive limitation. Patient is motivated to learn. Patient understands and agrees with treatment goals and plan. PT explains patient will be examined in standing, sitting, and lying down to see how their muscles and joints work. When they are ready, they will be asked to remove their underwear so PT can examine their perineum. The patient is also given the option of providing their own chaperone as one is  not provided in our facility. The patient also has the right and is explained the right to defer or refuse any part of the evaluation or treatment including the internal exam. With the patient's consent, PT will use one gloved finger to gently assess the muscles of the pelvic floor, seeing how well it contracts and relaxes and if there is muscle symmetry. After, the patient will get dressed and PT and patient will discuss exam findings and plan of care. PT and patient discuss plan of care, schedule, attendance policy and HEP activities.  Going through the anus working on the levator ani, puborectalis, coccygeus, anococcygeal ligament with one finger internally and other externally, distraction of the coccyx Educated patient on how to use her pelvic floor wand through the rectum  to massage the muscles in the pelvic floor 3 x per week and how to work on trigger points Neuromuscular re-education: Down training:diaphragmatic breathing in sidely then breathing out to push therapist finger out of the rectum and had patient to practice in sitting to prepare her for pushing stool out          PATIENT EDUCATION:  01/26/2022 Education details: Access Code: [email or web address] Person educated: Patient Education method: Explanation Education comprehension: verbalized understanding     HOME EXERCISE PROGRAM: 01/26/2022 Access Code: [email or web address] URL: [email or web address] Date: 01/26/2022 Prepared by: Earlie Counts  Exercises - Supine Hip Adduction Isometric with Ball  - 1 x daily - 7 x weekly - 1 sets - 10 reps - 4 sec hold - Supine Transversus Abdominis Bracing - Hands on Thighs  - 1 x daily - 7 x weekly - 1 sets - 10 reps - 5 sec hold     ASSESSMENT:   CLINICAL IMPRESSION: Patient is a 71 y.o. female who was seen today for physical therapy  treatment for levator spasm.   Pelvic floor strength is 3/5 now. Patient had release of the anococcygeal ligament and levator ani today.  She is able to perform  a pelvic floor drop. She is able to feel the vaginal contraction better.  The pelvic floor tightness is not a ball feeling, instead is more dispersed. Patient will benefit from skilled therapy to improve pelvic floor coordination to relax the pelvic floor and have a full bowel movement and reduce urinary leakage.      OBJECTIVE IMPAIRMENTS decreased coordination, decreased endurance, increased fascial restrictions, increased muscle spasms, and impaired tone.    ACTIVITY LIMITATIONS toileting   PARTICIPATION LIMITATIONS: community activity   PERSONAL FACTORS Fitness and 3+ comorbidities: C-section; bil. Hips replaced; lumbar laminectomy  are also affecting patient's functional outcome.    REHAB POTENTIAL: Excellent   CLINICAL DECISION MAKING: Stable/uncomplicated   EVALUATION COMPLEXITY: Low     GOALS: Goals reviewed with patient? Yes   SHORT TERM GOALS: Target date: 01/23/2022   Patient independent with the use of the pelvic wand to work on the pelvic floor tissue through the anus and vagina.  Baseline: Goal status: met 01/03/2022   2.  Patient able to perform diaphragmatic breathing in sitting to relax the pelvic floor.  Baseline:  Goal status: Met 01/11/2022   3.  Patient reports the tightness in the pelvic floor has reduced >/= 25% due to reduction of the trigger points.  Baseline:  Goal status: ongoing 01/11/2022       LONG TERM GOALS: Target date: 03/20/2022    Patient is independent with her HEP to relax the pelvic floor and strengthen.  Baseline:  Goal status: INITIAL   2.  Patient reports no urinary leakage due to pelvic floor strength is >/= 3/5.  Baseline:  Goal status: INITIAL   3.  Patient is able to push the full stool our and not feel she has some remaining due to the ability to fully relax her pelvic floor.  Baseline:  Goal status: INITIAL   4.  Patient is able to sit without feeling a band or ball in the anal region due to reduction of tightness in the  pelvic floor  Baseline:  Goal status: INITIAL     PLAN: PT FREQUENCY: 1x/week   PT DURATION: 12 weeks   PLANNED INTERVENTIONS: Therapeutic exercises, Therapeutic activity, Neuromuscular re-education, Patient/Family education, Self Care, Joint mobilization, Dry Needling, Electrical stimulation, Spinal mobilization, Cryotherapy, Moist heat, Biofeedback, and  Manual therapy   PLAN FOR NEXT SESSION: manual work to the pelvic floor vaginally going through vaginally and rectally,  diaphragmatic breathing to do pelvic floor drop,pelvic floor contraction with not contracting the gluteal, manual work to the [employer], PT 01/26/22 9:32 AM

## 2022-01-27 ENCOUNTER — Ambulatory Visit: Payer: Medicare Other | Admitting: Obstetrics and Gynecology

## 2022-01-27 ENCOUNTER — Ambulatory Visit (INDEPENDENT_AMBULATORY_CARE_PROVIDER_SITE_OTHER): Payer: Medicare Other | Admitting: Obstetrics and Gynecology

## 2022-01-27 ENCOUNTER — Encounter: Payer: Self-pay | Admitting: Obstetrics and Gynecology

## 2022-01-27 VITALS — BP 176/83 | HR 67

## 2022-01-27 DIAGNOSIS — N3941 Urge incontinence: Secondary | ICD-10-CM

## 2022-01-27 DIAGNOSIS — N393 Stress incontinence (female) (male): Secondary | ICD-10-CM | POA: Diagnosis not present

## 2022-01-27 DIAGNOSIS — M62838 Other muscle spasm: Secondary | ICD-10-CM

## 2022-01-27 NOTE — Progress Notes (Signed)
Ms. Cynthia Cochran is a 71 y.o. female who presents for levator trigger point injection. Series #6  Vitals:   01/27/22 1107  BP: (!) 159/85  Pulse: 67    PT/ injections help some temporarily but is having trouble getting longer term relief. Still having some discomfort in her left hip. She is taking methocarbamol during the day and tizanidine at night. Urge incontinence is still bothersome to her, leaking at night and occasionally during the day. Stopped oxybutynin but does not want to start another medication at this time.   Indication(s): Levator spasm, perineal pain.   Informed Consent:  The alternatives, risks and benefits of the procedure were explained to the patient. Risks including, but not limited to discomfort, pain, bleeding, infection, injury to nearby structures, inability to perform the procedure, failure of the procedure were discussed.  All questions were answered and the patient elected to proceed.  Procedure:   The patient was positioned in dorsal lithotomy position.  The vaginal tissues were prepped with Hibiclens solution.  An injection of 10cc of a mixture of 90% anesthetic (0.25% Bupivacaine) and 10% 40mg /ml Triamcinolone acetomide (Kenalog) was performed into puborectalis and obturator interus through the vagina in 4 locations.  Pressure was held over bleeding areas until good hemostasis was achieved.   The patient tolerated the procedure well with no apparent complications.  Plan:  - Continue with PT - Return 3 months  for follow up. Will reassess leakage at that time.  - If not seeing improvement after PT, we discussed potential referral to colorectal due to tight puborectalis muscle and constipation.   , MD  Time spent: I spent 20 minutes dedicated to the care of this patient on the date of this encounter to include pre-visit review of records, face-to-face time with the patient and post visit documentation in addition to the procedure.

## 2022-02-06 DIAGNOSIS — Z719 Counseling, unspecified: Secondary | ICD-10-CM

## 2022-02-07 ENCOUNTER — Ambulatory Visit (INDEPENDENT_AMBULATORY_CARE_PROVIDER_SITE_OTHER): Payer: Self-pay | Admitting: Plastic Surgery

## 2022-02-07 ENCOUNTER — Encounter: Payer: Self-pay | Admitting: Physical Therapy

## 2022-02-07 ENCOUNTER — Encounter: Payer: Medicare Other | Admitting: Physical Therapy

## 2022-02-07 DIAGNOSIS — R252 Cramp and spasm: Secondary | ICD-10-CM

## 2022-02-07 DIAGNOSIS — R278 Other lack of coordination: Secondary | ICD-10-CM

## 2022-02-07 DIAGNOSIS — Z719 Counseling, unspecified: Secondary | ICD-10-CM

## 2022-02-07 NOTE — Progress Notes (Signed)
   Botox LOT:  [id number]  EXP:  26/2  The left brow 1.5 cm above brow and right lower lateral lid was injected for improved symmetry. The patient clearly had botox at her last visit.

## 2022-02-07 NOTE — Therapy (Signed)
OUTPATIENT PHYSICAL THERAPY TREATMENT NOTE   Patient Name: Cynthia Cochran MRN: [id number] DOB:1951/01/01, 71 y.o., female Today's Date: 02/07/2022  PCP:  Celene Squibb, MD   REFERRING PROVIDER:  Jaquita Folds, MD    END OF SESSION:   PT End of Session - 02/07/22 0834     Visit Number 6    Date for PT Re-Evaluation 03/20/22    Authorization Type Medicare    Authorization - Visit Number 6    Authorization - Number of Visits 10    PT Start Time 0830    PT Stop Time 0915    PT Time Calculation (min) 45 min    Activity Tolerance Patient tolerated treatment well    Behavior During Therapy [facility] for tasks assessed/performed             Past Medical History:  Diagnosis Date   Anxiety    Arthritis    Blood transfusion without reported diagnosis 1982   after c section   Dysphagia 04/25/2016   Gastric ulcer    GERD (gastroesophageal reflux disease)    Heart murmur    High cholesterol 04/25/2016   Hyperlipidemia    Mixed stress and urge urinary incontinence 09/09/2015   Sciatic nerve pain    Spinal stenosis    Past Surgical History:  Procedure Laterality Date   BIOPSY  05/12/2016   Procedure: BIOPSY;  Surgeon: Rogene Houston, MD;  [facility];  Service: Endoscopy;;  gastric   BIOPSY  10/04/2018   Procedure: BIOPSY;  Surgeon: Rogene Houston, MD;  [facility];  Service: Endoscopy;;  gastric    CARPAL TUNNEL RELEASE     right; 1998, left [place]   COLONOSCOPY WITH PROPOFOL N/A 10/04/2018   Procedure: COLONOSCOPY WITH PROPOFOL;  Surgeon: Rogene Houston, MD;  [facility];  Service: Endoscopy;  Laterality: N/A;  7:30   Colonscopy     ESOPHAGEAL DILATION N/A 05/12/2016   Procedure: ESOPHAGEAL DILATION;  Surgeon: Rogene Houston, MD;  [facility];  Service: Endoscopy;  Laterality: N/A;   ESOPHAGOGASTRODUODENOSCOPY N/A 05/12/2016   Procedure: ESOPHAGOGASTRODUODENOSCOPY (EGD);  Surgeon: Rogene Houston, MD;   [facility];  Service: Endoscopy;  Laterality: N/A;  9:30   ESOPHAGOGASTRODUODENOSCOPY (EGD) WITH PROPOFOL N/A 10/04/2018   Procedure: ESOPHAGOGASTRODUODENOSCOPY (EGD) WITH PROPOFOL;  Surgeon: Rogene Houston, MD;  [facility];  Service: Endoscopy;  Laterality: N/A;   EYE SURGERY     lasix   FLEXIBLE SIGMOIDOSCOPY N/A 01/28/2021   Procedure: FLEXIBLE SIGMOIDOSCOPY WITH PROPOFOL;  Surgeon: Rogene Houston, MD;  [facility];  Service: Endoscopy;  Laterality: N/A;  9:30   JOINT REPLACEMENT     both hips   LUMBAR LAMINECTOMY/DECOMPRESSION MICRODISCECTOMY N/A 08/05/2020   Procedure: LAMINECTOMY THORACIC TEN-ELEVEN;  Surgeon: Ashok Pall, MD;  Location: [place];  Service: Neurosurgery;  Laterality: N/A;   LUMBAR LAMINECTOMY/DECOMPRESSION MICRODISCECTOMY N/A 10/14/2020   Procedure: Right Lumbar 4-5, Lumbar 5 Sacral 1 Lumbar and thoracic laminectomy;  Surgeon: Ashok Pall, MD;  Location: [place];  Service: Neurosurgery;  Laterality: N/A;  Right Lumbar 4-5, Lumbar 5 Sacral 1 Lumbar and thoracic laminectomy   TOTAL HIP ARTHROPLASTY     right; March 2013; left March 2011   Patient Active Problem List   Diagnosis Date Noted   Encounter for counseling 01/09/2022   Pelvic relaxation due to cystocele, midline 04/15/2021   Postmenopausal 04/15/2021  Rectal pain 04/05/2021   Anal fissure 12/21/2020   Constipation 08/12/2020   Thoracic spinal stenosis 08/05/2020   Gait disturbance 11/04/2018   Numbness and tingling 11/04/2018   Lumbar radiculopathy 11/04/2018   Cervical stenosis of spinal canal 11/04/2018   PUD (peptic ulcer disease) 05/10/2018   Anemia 05/10/2018   Special screening for malignant neoplasms, colon 05/10/2018   Abdominal pain 04/07/2018   Hypokalemia 04/07/2018   Osteoarthritis 04/07/2018   Elevated blood pressure reading 04/07/2018   Family hx of colon cancer 02/27/2018   Screening for colorectal cancer 11/06/2017   Encounter for well woman exam  with routine gynecological exam 11/06/2017   High cholesterol 04/25/2016   Dysphagia 04/25/2016   Esophageal dysphagia 04/25/2016   Mixed stress and urge urinary incontinence 09/09/2015   REFERRING DIAG: Y58.592 (ICD-10-CM) - Levator spasm   THERAPY DIAG:  No diagnosis found.   Rationale for Evaluation and Treatment Rehabilitation   ONSET DATE: 2021   SUBJECTIVE:                                                                                                                                                                                            SUBJECTIVE STATEMENT: MD said I may need a colorectal surgeon. MD said the puborectalis is tight. The rectal area is so hard and tight. It is worse with sitting and standing. Sits mostly on the right side. Bowel movements are having trouble coming out for the last 3-4 days. The rectum unable to push them out.    PAIN:  Are you having pain? No   PRECAUTIONS: None   WEIGHT BEARING RESTRICTIONS No   FALLS:  Has patient fallen in last 6 months? No   LIVING ENVIRONMENT: Lives with: lives with their family     OCCUPATION: retired; no exercise   PLOF: Independent   PATIENT GOALS rectum to feel normal   PERTINENT HISTORY:  C-section; bil. Hips replaced; lumbar laminectomy;    BOWEL MOVEMENT Pain with bowel movement: No Type of bowel movement:Type (Bristol Stool Scale) type 4 at 3 inches, Frequency daily for several times, Strain No, and Splinting no Fully empty rectum: No, uses the squatty potty Leakage: No Fiber supplement: Yes: miralax, metamucil   URINATION Pain with urination: No Fully empty bladder: Yes:   Stream: Strong Urgency: No Frequency: Day time voids 3-4.  Nocturia: 0 times per night to void Leakage: Urge to void, Walking to the bathroom, Coughing, and Sneezing, not leakage for the past week Pads: 1 liners/ mini-pads per day   INTERCOURSE not sexually active   PREGNANCY C-section deliveries 1 still born  OBJECTIVE: (objective measures completed at initial evaluation unless otherwise dated)     (Copy Eval's Objective through Plan section here)   DIAGNOSTIC FINDINGS:  Pelvic floor strength I/V, puborectalis III/V external anal sphincter IV/V  Pelvic floor musculature: + tension in puborectalis on rectal exam, On vaginal exam, Right levator tender, Right obturator tender, Left levator tender, Left obturator tender PVR of 31 ml was obtained by bladder scan 01/12/2022 x-ray showed a utering fibroid that is hardened.        COGNITION:            Overall cognitive status: Within functional limits for tasks assessed                          SENSATION:            Light touch: Appears intact            Proprioception: Appears intact                    POSTURE: No Significant postural limitations               PELVIC ALIGNMENT: Bilateral ASIS are equal   LUMBARAROM/PROM not assessed due to having an epidural this morning.      LOWER EXTREMITY ROM: Within functional limits     LOWER EXTREMITY MMT: bilateral hip strength is 4/5      PALPATION:   General  Patient over contracts the upper abdominals and does not contract the lower abdominals.                  External Perineal Exam vaginal dryness, tightness along the perineal body and superior transverse                             Internal Pelvic Floor tenderness located in the left puborectalis, bilateral obturator internist; tightness in bilateral iliococcygeus, puborectalis,    Patient confirms identification and approves PT to assess internal pelvic floor and treatment Yes   PELVIC MMT:   MMT eval 01/03/2022 01/26/2022 02/07/2022  Vaginal 2/5 anterior/posterior; 1/5 laterally 2/5 with circular contraction holding for 2 seconds; at times she will bulge her pelvic floor 3/5   Internal Anal Sphincter 3/5   3/5 4/5  External Anal Sphincter 3/5   3/5 4/5  Puborectalis 2/5   3/5 4/5  (Blank rows = not tested)          TONE: increased   PROLAPSE: none   TODAY'S TREATMENT  02/07/2022 Manual: Soft tissue mobilization: manual work to the left gluteal and SI joint in right sidely, to assess dry needling to pelvic floor muscles.  Myofascial release: using a suction cup to the left gluteal, left SI joint, and posterior left hip to release the fascial Internal pelvic floor techniques: No emotional/communication barriers or cognitive limitation. Patient is motivated to learn. Patient understands and agrees with treatment goals and plan. PT explains patient will be examined in standing, sitting, and lying down to see how their muscles and joints work. When they are ready, they will be asked to remove their underwear so PT can examine their perineum. The patient is also given the option of providing their own chaperone as one is not provided in our facility. The patient also has the right and is explained the right to defer or refuse any part of the evaluation or treatment including the internal exam. With the  patient's consent, PT will use one gloved finger to gently assess the muscles of the pelvic floor, seeing how well it contracts and relaxes and if there is muscle symmetry. After, the patient will get dressed and PT and patient will discuss exam findings and plan of care. PT and patient discuss plan of care, schedule, attendance policy and HEP activities. Going into the anal canal working on the left puborectalis, iliococcygeus, anococcygeal ligament, coccygeus to elongate after dry needling  Trigger Point Dry-Needling  Treatment instructions: Expect mild to moderate muscle soreness. S/S of pneumothorax if dry needled over a lung field, and to seek immediate medical attention should they occur. Patient verbalized understanding of these instructions and education.  Patient Consent Given: Yes Education handout provided: Yes Muscles treated: left coccygeus, puborectalis, and iliococcygeus Electrical stimulation  performed: No Parameters: N/A Treatment response/outcome: elongation of muscle and trigger point response  Neuromuscular re-education: Pelvic floor contraction training: tactile cue to the rectal muscles for a circular contraction and lift in left sidely and sitting    01/26/2022 Manual: Soft tissue mobilization: Scar tissue mobilization: Myofascial release: Spinal mobilization: Internal pelvic floor techniques: No emotional/communication barriers or cognitive limitation. Patient is motivated to learn. Patient understands and agrees with treatment goals and plan. PT explains patient will be examined in standing, sitting, and lying down to see how their muscles and joints work. When they are ready, they will be asked to remove their underwear so PT can examine their perineum. The patient is also given the option of providing their own chaperone as one is not provided in our facility. The patient also has the right and is explained the right to defer or refuse any part of the evaluation or treatment including the internal exam. With the patient's consent, PT will use one gloved finger to gently assess the muscles of the pelvic floor, seeing how well it contracts and relaxes and if there is muscle symmetry. After, the patient will get dressed and PT and patient will discuss exam findings and plan of care. PT and patient discuss plan of care, schedule, attendance policy and HEP activities.  One finger in the anus and other in the vaginal canal working on the ATLA, levator ani, anococcygeal ligament Neuromuscular re-education: Core facilitation:  Supine press hands into thighs with vaginal contraction holding for 5 sec 10x Supine ball squeeze with pelvic floor contraction holding 5 sec 10x Pelvic floor contraction training:one finger in the anus working on pelvic floor drop with diaphragmatic breathing One finger in the anus working on anal contraction and other finger in the vaginal working on the  vaginal contraction Down training: diaphragmatic breathing with dropping of the pelvic floor  Standing with leaning on counter and therapist placing her index finger on the anococcygeal ligament, patient is moving back and forth, diagonals, and mini squat to mobilize the ligament     01/19/2022 Manual: Internal pelvic floor techniques: No emotional/communication barriers or cognitive limitation. Patient is motivated to learn. Patient understands and agrees with treatment goals and plan. PT explains patient will be examined in standing, sitting, and lying down to see how their muscles and joints work. When they are ready, they will be asked to remove their underwear so PT can examine their perineum. The patient is also given the option of providing their own chaperone as one is not provided in our facility. The patient also has the right and is explained the right to defer or refuse any part of the evaluation or treatment including the  internal exam. With the patient's consent, PT will use one gloved finger to gently assess the muscles of the pelvic floor, seeing how well it contracts and relaxes and if there is muscle symmetry. After, the patient will get dressed and PT and patient will discuss exam findings and plan of care. PT and patient discuss plan of care, schedule, attendance policy and HEP activities.  One finger in the anal canal and other in the vaginal canal working on the levator ani and anococcygeal ligament feeling for the releases and elongating the muscles in left sidely Self-care: Therapist explained to patient what a uterine fibroid is and how it can affect the pelvic floor        PATIENT EDUCATION:  01/26/2022 Education details: Access Code: [email or web address] Person educated: Patient Education method: Explanation Education comprehension: verbalized understanding     HOME EXERCISE PROGRAM: 01/26/2022 Access Code: [email or web address] URL: [email or web address] Date:  01/26/2022 Prepared by: Earlie Counts   Exercises - Supine Hip Adduction Isometric with Ball  - 1 x daily - 7 x weekly - 1 sets - 10 reps - 4 sec hold - Supine Transversus Abdominis Bracing - Hands on Thighs  - 1 x daily - 7 x weekly - 1 sets - 10 reps - 5 sec hold     ASSESSMENT:   CLINICAL IMPRESSION: Patient is a 71 y.o. female who was seen today for physical therapy  treatment for levator spasm.   Pelvic floor strength is 4/5 after tactile cues and several tries. Patient is now able to feel the rectal contraction. Patient responded to the dry needling. She was able to sit with less of a firm feeling in the rectum.   She is able to perform a pelvic floor drop. She is able to feel the vaginal contraction better. Patient will benefit from skilled therapy to improve pelvic floor coordination to relax the pelvic floor and have a full bowel movement and reduce urinary leakage.      OBJECTIVE IMPAIRMENTS decreased coordination, decreased endurance, increased fascial restrictions, increased muscle spasms, and impaired tone.    ACTIVITY LIMITATIONS toileting   PARTICIPATION LIMITATIONS: community activity   PERSONAL FACTORS Fitness and 3+ comorbidities: C-section; bil. Hips replaced; lumbar laminectomy  are also affecting patient's functional outcome.    REHAB POTENTIAL: Excellent   CLINICAL DECISION MAKING: Stable/uncomplicated   EVALUATION COMPLEXITY: Low     GOALS: Goals reviewed with patient? Yes   SHORT TERM GOALS: Target date: 01/23/2022   Patient independent with the use of the pelvic wand to work on the pelvic floor tissue through the anus and vagina.  Baseline: Goal status: met 01/03/2022   2.  Patient able to perform diaphragmatic breathing in sitting to relax the pelvic floor.  Baseline:  Goal status: Met 01/11/2022   3.  Patient reports the tightness in the pelvic floor has reduced >/= 25% due to reduction of the trigger points.  Baseline:  Goal status: ongoing  01/11/2022       LONG TERM GOALS: Target date: 03/20/2022    Patient is independent with her HEP to relax the pelvic floor and strengthen.  Baseline:  Goal status: ongoign 02/07/2022   2.  Patient reports no urinary leakage due to pelvic floor strength is >/= 3/5.  Baseline:  Goal status: ongoing 02/07/2022   3.  Patient is able to push the full stool our and not feel she has some remaining due to the ability to fully relax her pelvic floor.  Baseline:  Goal status: ongoing  02/07/2022   4.  Patient is able to sit without feeling a band or ball in the anal region due to reduction of tightness in the pelvic floor  Baseline:  Goal status: ongoing 02/07/2022     PLAN: PT FREQUENCY: 1x/week   PT DURATION: 12 weeks   PLANNED INTERVENTIONS: Therapeutic exercises, Therapeutic activity, Neuromuscular re-education, Patient/Family education, Self Care, Joint mobilization, Dry Needling, Electrical stimulation, Spinal mobilization, Cryotherapy, Moist heat, Biofeedback, and Manual therapy   PLAN FOR NEXT SESSION: see if the dry needling help and if to do it again, see how the left hip is working, work on Conservation officer, historic buildings contraction and doing Chief Executive Officer.    Earlie Counts, PT 02/07/22 9:33 AM

## 2022-02-07 NOTE — Patient Instructions (Addendum)

## 2022-02-10 ENCOUNTER — Ambulatory Visit: Payer: Medicare Other | Admitting: Obstetrics and Gynecology

## 2022-02-14 ENCOUNTER — Encounter: Payer: Self-pay | Admitting: Physical Therapy

## 2022-02-14 ENCOUNTER — Encounter: Payer: Medicare Other | Attending: Obstetrics and Gynecology | Admitting: Physical Therapy

## 2022-02-14 DIAGNOSIS — R252 Cramp and spasm: Secondary | ICD-10-CM

## 2022-02-14 DIAGNOSIS — M62838 Other muscle spasm: Secondary | ICD-10-CM | POA: Diagnosis not present

## 2022-02-14 DIAGNOSIS — R278 Other lack of coordination: Secondary | ICD-10-CM

## 2022-02-14 NOTE — Therapy (Signed)
OUTPATIENT PHYSICAL THERAPY TREATMENT NOTE   Patient Name: Cynthia Cochran MRN: [id number] DOB:14-May-1950, 72 y.o., female Today's Date: 02/14/2022  PCP: Celene Squibb, MD   REFERRING PROVIDER: Jaquita Folds, MD     END OF SESSION:   PT End of Session - 02/14/22 0837     Visit Number 7    Date for PT Re-Evaluation 03/20/22    Authorization Type Medicare    Authorization - Visit Number 7    Authorization - Number of Visits 10    PT Start Time 0830    PT Stop Time 0925    PT Time Calculation (min) 55 min    Activity Tolerance Patient tolerated treatment well    Behavior During Therapy [facility] for tasks assessed/performed             Past Medical History:  Diagnosis Date   Anxiety    Arthritis    Blood transfusion without reported diagnosis 1982   after c section   Dysphagia 04/25/2016   Gastric ulcer    GERD (gastroesophageal reflux disease)    Heart murmur    High cholesterol 04/25/2016   Hyperlipidemia    Mixed stress and urge urinary incontinence 09/09/2015   Sciatic nerve pain    Spinal stenosis    Past Surgical History:  Procedure Laterality Date   BIOPSY  05/12/2016   Procedure: BIOPSY;  Surgeon: Rogene Houston, MD;  [facility];  Service: Endoscopy;;  gastric   BIOPSY  10/04/2018   Procedure: BIOPSY;  Surgeon: Rogene Houston, MD;  [facility];  Service: Endoscopy;;  gastric    CARPAL TUNNEL RELEASE     right; 1998, left [place]   COLONOSCOPY WITH PROPOFOL N/A 10/04/2018   Procedure: COLONOSCOPY WITH PROPOFOL;  Surgeon: Rogene Houston, MD;  [facility];  Service: Endoscopy;  Laterality: N/A;  7:30   Colonscopy     ESOPHAGEAL DILATION N/A 05/12/2016   Procedure: ESOPHAGEAL DILATION;  Surgeon: Rogene Houston, MD;  [facility];  Service: Endoscopy;  Laterality: N/A;   ESOPHAGOGASTRODUODENOSCOPY N/A 05/12/2016   Procedure: ESOPHAGOGASTRODUODENOSCOPY (EGD);  Surgeon: Rogene Houston, MD;   [facility];  Service: Endoscopy;  Laterality: N/A;  9:30   ESOPHAGOGASTRODUODENOSCOPY (EGD) WITH PROPOFOL N/A 10/04/2018   Procedure: ESOPHAGOGASTRODUODENOSCOPY (EGD) WITH PROPOFOL;  Surgeon: Rogene Houston, MD;  [facility];  Service: Endoscopy;  Laterality: N/A;   EYE SURGERY     lasix   FLEXIBLE SIGMOIDOSCOPY N/A 01/28/2021   Procedure: FLEXIBLE SIGMOIDOSCOPY WITH PROPOFOL;  Surgeon: Rogene Houston, MD;  [facility];  Service: Endoscopy;  Laterality: N/A;  9:30   JOINT REPLACEMENT     both hips   LUMBAR LAMINECTOMY/DECOMPRESSION MICRODISCECTOMY N/A 08/05/2020   Procedure: LAMINECTOMY THORACIC TEN-ELEVEN;  Surgeon: Ashok Pall, MD;  Location: [place];  Service: Neurosurgery;  Laterality: N/A;   LUMBAR LAMINECTOMY/DECOMPRESSION MICRODISCECTOMY N/A 10/14/2020   Procedure: Right Lumbar 4-5, Lumbar 5 Sacral 1 Lumbar and thoracic laminectomy;  Surgeon: Ashok Pall, MD;  Location: [place];  Service: Neurosurgery;  Laterality: N/A;  Right Lumbar 4-5, Lumbar 5 Sacral 1 Lumbar and thoracic laminectomy   TOTAL HIP ARTHROPLASTY     right; March 2013; left March 2011   Patient Active Problem List   Diagnosis Date Noted   Encounter for counseling 01/09/2022   Pelvic relaxation due to cystocele, midline 04/15/2021   Postmenopausal 04/15/2021  Rectal pain 04/05/2021   Anal fissure 12/21/2020   Constipation 08/12/2020   Thoracic spinal stenosis 08/05/2020   Gait disturbance 11/04/2018   Numbness and tingling 11/04/2018   Lumbar radiculopathy 11/04/2018   Cervical stenosis of spinal canal 11/04/2018   PUD (peptic ulcer disease) 05/10/2018   Anemia 05/10/2018   Special screening for malignant neoplasms, colon 05/10/2018   Abdominal pain 04/07/2018   Hypokalemia 04/07/2018   Osteoarthritis 04/07/2018   Elevated blood pressure reading 04/07/2018   Family hx of colon cancer 02/27/2018   Screening for colorectal cancer 11/06/2017   Encounter for well woman exam  with routine gynecological exam 11/06/2017   High cholesterol 04/25/2016   Dysphagia 04/25/2016   Esophageal dysphagia 04/25/2016   Mixed stress and urge urinary incontinence 09/09/2015   REFERRING DIAG: M76.720 (ICD-10-CM) - Levator spasm   THERAPY DIAG:  No diagnosis found.   Rationale for Evaluation and Treatment Rehabilitation   ONSET DATE: 2021   SUBJECTIVE:                                                                                                                                                                                            SUBJECTIVE STATEMENT: I still have trouble sitting. I feel like the bulge is not as big. I feel like the bulge is more in my vagina. I feel it more in sitting with equal weight on each buttocks. Patient reports when she wakes up in the morning there is not the bulge but when she gets up the bulge will return.    PAIN:  Are you having pain? No   PRECAUTIONS: None   WEIGHT BEARING RESTRICTIONS No   FALLS:  Has patient fallen in last 6 months? No   LIVING ENVIRONMENT: Lives with: lives with their family     OCCUPATION: retired; no exercise   PLOF: Independent   PATIENT GOALS rectum to feel normal   PERTINENT HISTORY:  C-section; bil. Hips replaced; lumbar laminectomy;    BOWEL MOVEMENT Pain with bowel movement: No Type of bowel movement:Type (Bristol Stool Scale) type 4 at 3 inches, Frequency daily for several times, Strain No, and Splinting no Fully empty rectum: No, uses the squatty potty Leakage: No Fiber supplement: Yes: miralax, metamucil   URINATION Pain with urination: No Fully empty bladder: Yes:   Stream: Strong Urgency: No Frequency: Day time voids 3-4.  Nocturia: 0 times per night to void Leakage: Urge to void, Walking to the bathroom, Coughing, and Sneezing, not leakage for the past week Pads: 1 liners/ mini-pads per day   INTERCOURSE not sexually active   PREGNANCY C-section deliveries 1 still  born         OBJECTIVE: (objective measures completed at initial evaluation unless otherwise dated)     DIAGNOSTIC FINDINGS:  Pelvic floor strength I/V, puborectalis III/V external anal sphincter IV/V  Pelvic floor musculature: + tension in puborectalis on rectal exam, On vaginal exam, Right levator tender, Right obturator tender, Left levator tender, Left obturator tender PVR of 31 ml was obtained by bladder scan 01/12/2022 x-ray showed a utering fibroid that is hardened.        COGNITION:            Overall cognitive status: Within functional limits for tasks assessed                          SENSATION:            Light touch: Appears intact            Proprioception: Appears intact                    POSTURE: No Significant postural limitations               PELVIC ALIGNMENT: Bilateral ASIS are equal   LUMBARAROM/PROM not assessed due to having an epidural this morning.      LOWER EXTREMITY ROM: Within functional limits     LOWER EXTREMITY MMT: bilateral hip strength is 4/5      PALPATION:   General  Patient over contracts the upper abdominals and does not contract the lower abdominals.                  External Perineal Exam vaginal dryness, tightness along the perineal body and superior transverse                             Internal Pelvic Floor tenderness located in the left puborectalis, bilateral obturator internist; tightness in bilateral iliococcygeus, puborectalis,    Patient confirms identification and approves PT to assess internal pelvic floor and treatment Yes   PELVIC MMT:   MMT eval 01/03/2022 01/26/2022 02/07/2022  Vaginal 2/5 anterior/posterior; 1/5 laterally 2/5 with circular contraction holding for 2 seconds; at times she will bulge her pelvic floor 3/5    Internal Anal Sphincter 3/5   3/5 4/5  External Anal Sphincter 3/5   3/5 4/5  Puborectalis 2/5   3/5 4/5  (Blank rows = not tested)         TONE: increased   PROLAPSE: none   TODAY'S TREATMENT   02/14/2022 Manual: Soft tissue mobilization: To address for dry needling Manual work with the addaday to the left gluteus medius, left piriformis, gluteus maximus, left hip rotators  Internal pelvic floor techniques:No emotional/communication barriers or cognitive limitation. Patient is motivated to learn. Patient understands and agrees with treatment goals and plan. PT explains patient will be examined in standing, sitting, and lying down to see how their muscles and joints work. When they are ready, they will be asked to remove their underwear so PT can examine their perineum. The patient is also given the option of providing their own chaperone as one is not provided in our facility. The patient also has the right and is explained the right to defer or refuse any part of the evaluation or treatment including the internal exam. With the patient's consent, PT will use one gloved finger to gently assess the muscles of the pelvic floor, seeing how  well it contracts and relaxes and if there is muscle symmetry. After, the patient will get dressed and PT and patient will discuss exam findings and plan of care. PT and patient discuss plan of care, schedule, attendance policy and HEP activities.  Going through the anus to work on the puborectalis, external anal sphincter, along the iliococcygeus, coccygeus, obturator internist to elongate and reduce trigger points Trigger Point Dry-Needling  Treatment instructions: Expect mild to moderate muscle soreness. S/S of pneumothorax if dry needled over a lung field, and to seek immediate medical attention should they occur. Patient verbalized understanding of these instructions and education.  Patient Consent Given: Yes Education handout provided: Previously provided Muscles treated: left puborectalis, perineal body, left coccygeus, left obturator internist, left piriformis, left gluteus medius, left lumbar multifidi Electrical stimulation performed: No Parameters:  N/A Treatment response/outcome: trigger point release and elongation of muscles    02/07/2022 Manual: Soft tissue mobilization: manual work to the left gluteal and SI joint in right sidely, to assess dry needling to pelvic floor muscles.  Myofascial release: using a suction cup to the left gluteal, left SI joint, and posterior left hip to release the fascial Internal pelvic floor techniques: No emotional/communication barriers or cognitive limitation. Patient is motivated to learn. Patient understands and agrees with treatment goals and plan. PT explains patient will be examined in standing, sitting, and lying down to see how their muscles and joints work. When they are ready, they will be asked to remove their underwear so PT can examine their perineum. The patient is also given the option of providing their own chaperone as one is not provided in our facility. The patient also has the right and is explained the right to defer or refuse any part of the evaluation or treatment including the internal exam. With the patient's consent, PT will use one gloved finger to gently assess the muscles of the pelvic floor, seeing how well it contracts and relaxes and if there is muscle symmetry. After, the patient will get dressed and PT and patient will discuss exam findings and plan of care. PT and patient discuss plan of care, schedule, attendance policy and HEP activities. Going into the anal canal working on the left puborectalis, iliococcygeus, anococcygeal ligament, coccygeus to elongate after dry needling  Trigger Point Dry-Needling  Treatment instructions: Expect mild to moderate muscle soreness. S/S of pneumothorax if dry needled over a lung field, and to seek immediate medical attention should they occur. Patient verbalized understanding of these instructions and education.   Patient Consent Given: Yes Education handout provided: Yes Muscles treated: left coccygeus, puborectalis, and  iliococcygeus Electrical stimulation performed: No Parameters: N/A Treatment response/outcome: elongation of muscle and trigger point response   Neuromuscular re-education: Pelvic floor contraction training: tactile cue to the rectal muscles for a circular contraction and lift in left sidely and sitting    01/26/2022 Manual: Internal pelvic floor techniques: No emotional/communication barriers or cognitive limitation. Patient is motivated to learn. Patient understands and agrees with treatment goals and plan. PT explains patient will be examined in standing, sitting, and lying down to see how their muscles and joints work. When they are ready, they will be asked to remove their underwear so PT can examine their perineum. The patient is also given the option of providing their own chaperone as one is not provided in our facility. The patient also has the right and is explained the right to defer or refuse any part of the evaluation or treatment including the  internal exam. With the patient's consent, PT will use one gloved finger to gently assess the muscles of the pelvic floor, seeing how well it contracts and relaxes and if there is muscle symmetry. After, the patient will get dressed and PT and patient will discuss exam findings and plan of care. PT and patient discuss plan of care, schedule, attendance policy and HEP activities.  One finger in the anus and other in the vaginal canal working on the ATLA, levator ani, anococcygeal ligament Neuromuscular re-education: Core facilitation:  Supine press hands into thighs with vaginal contraction holding for 5 sec 10x Supine ball squeeze with pelvic floor contraction holding 5 sec 10x Pelvic floor contraction training:one finger in the anus working on pelvic floor drop with diaphragmatic breathing One finger in the anus working on anal contraction and other finger in the vaginal working on the vaginal contraction Down training: diaphragmatic breathing  with dropping of the pelvic floor  Standing with leaning on counter and therapist placing her index finger on the anococcygeal ligament, patient is moving back and forth, diagonals, and mini squat to mobilize the ligament     PATIENT EDUCATION:  01/26/2022 Education details: Access Code: [email or web address] Person educated: Patient Education method: Explanation Education comprehension: verbalized understanding     HOME EXERCISE PROGRAM: 01/26/2022 Access Code: [email or web address] URL: [email or web address] Date: 01/26/2022 Prepared by: Earlie Counts   Exercises - Supine Hip Adduction Isometric with Ball  - 1 x daily - 7 x weekly - 1 sets - 10 reps - 4 sec hold - Supine Transversus Abdominis Bracing - Hands on Thighs  - 1 x daily - 7 x weekly - 1 sets - 10 reps - 5 sec hold     ASSESSMENT:   CLINICAL IMPRESSION: Patient is a 71 y.o. female who was seen today for physical therapy  treatment for levator spasm.   Patient did not have pain after dry needling. She had increased pressure of the vaginal area. She had trigger point response with the dry needling. She had increased fascial release along the left coccygeus and posterior external anal sphincter. Patient feels she does not have to strain to have a bowel movement due to the rectal muscles able to relax fully. She reports increased pressure in the vagina and urinary leakage when trying to get to the bathroom.  Patient will benefit from skilled therapy to improve pelvic floor coordination to relax the pelvic floor and have a full bowel movement and reduce urinary leakage.      OBJECTIVE IMPAIRMENTS decreased coordination, decreased endurance, increased fascial restrictions, increased muscle spasms, and impaired tone.    ACTIVITY LIMITATIONS toileting   PARTICIPATION LIMITATIONS: community activity   PERSONAL FACTORS Fitness and 3+ comorbidities: C-section; bil. Hips replaced; lumbar laminectomy  are also affecting patient's functional  outcome.    REHAB POTENTIAL: Excellent   CLINICAL DECISION MAKING: Stable/uncomplicated   EVALUATION COMPLEXITY: Low     GOALS: Goals reviewed with patient? Yes   SHORT TERM GOALS: Target date: 01/23/2022   Patient independent with the use of the pelvic wand to work on the pelvic floor tissue through the anus and vagina.  Baseline: Goal status: met 01/03/2022   2.  Patient able to perform diaphragmatic breathing in sitting to relax the pelvic floor.  Baseline:  Goal status: Met 01/11/2022   3.  Patient reports the tightness in the pelvic floor has reduced >/= 25% due to reduction of the trigger points.  Baseline:  Goal status: ongoing 01/11/2022  LONG TERM GOALS: Target date: 03/20/2022    Patient is independent with her HEP to relax the pelvic floor and strengthen.  Baseline:  Goal status: ongoign 02/07/2022   2.  Patient reports no urinary leakage due to pelvic floor strength is >/= 3/5.  Baseline:  Goal status: ongoing 02/07/2022   3.  Patient is able to push the full stool our and not feel she has some remaining due to the ability to fully relax her pelvic floor.  Baseline:  Goal status: ongoing 02/07/2022   4.  Patient is able to sit without feeling a band or ball in the anal region due to reduction of tightness in the pelvic floor  Baseline:  Goal status: ongoing 02/07/2022     PLAN: PT FREQUENCY: 1x/week   PT DURATION: 12 weeks   PLANNED INTERVENTIONS: Therapeutic exercises, Therapeutic activity, Neuromuscular re-education, Patient/Family education, Self Care, Joint mobilization, Dry Needling, Electrical stimulation, Spinal mobilization, Cryotherapy, Moist heat, Biofeedback, and Manual therapy   PLAN FOR NEXT SESSION: continue with the  dry needling,  work on holding pelvic floor contraction and doing the elevator.     Earlie Counts, PT 02/14/22 9:22 AM

## 2022-02-17 ENCOUNTER — Other Ambulatory Visit: Payer: Self-pay | Admitting: Internal Medicine

## 2022-02-17 DIAGNOSIS — Z1231 Encounter for screening mammogram for malignant neoplasm of breast: Secondary | ICD-10-CM

## 2022-02-20 ENCOUNTER — Other Ambulatory Visit: Payer: Self-pay | Admitting: Obstetrics and Gynecology

## 2022-02-20 DIAGNOSIS — K594 Anal spasm: Secondary | ICD-10-CM

## 2022-02-21 ENCOUNTER — Telehealth: Payer: Self-pay

## 2022-02-21 ENCOUNTER — Encounter: Payer: Self-pay | Admitting: Physical Therapy

## 2022-02-21 ENCOUNTER — Encounter: Payer: Medicare Other | Admitting: Physical Therapy

## 2022-02-21 DIAGNOSIS — R252 Cramp and spasm: Secondary | ICD-10-CM

## 2022-02-21 DIAGNOSIS — R278 Other lack of coordination: Secondary | ICD-10-CM

## 2022-02-21 DIAGNOSIS — M62838 Other muscle spasm: Secondary | ICD-10-CM | POA: Diagnosis not present

## 2022-02-21 NOTE — Telephone Encounter (Signed)
Cynthia Cochran is a 71 y.o. female called in because she said [name] PT wants the pt to let Dr. Florian Buff know her incontinence has gotten much worse.  Pt has been scheduled for a f/u on 02/22/2022 at 8:20 with Dr. Florian Buff

## 2022-02-21 NOTE — Therapy (Signed)
OUTPATIENT PHYSICAL THERAPY TREATMENT NOTE   Patient Name: Cynthia Cochran MRN: [id number] DOB:Dec 31, 1950, 71 y.o., female Today's Date: 02/21/2022  PCP: Celene Squibb, MD  REFERRING PROVIDER: Jaquita Folds, MD   END OF SESSION:   PT End of Session - 02/21/22 0837     Visit Number 8    Date for PT Re-Evaluation 03/20/22    Authorization Type Medicare    Authorization - Visit Number 8    Authorization - Number of Visits 10    PT Start Time 0830    PT Stop Time 0925    PT Time Calculation (min) 55 min    Activity Tolerance Patient tolerated treatment well    Behavior During Therapy [facility] for tasks assessed/performed             Past Medical History:  Diagnosis Date   Anxiety    Arthritis    Blood transfusion without reported diagnosis 1982   after c section   Dysphagia 04/25/2016   Gastric ulcer    GERD (gastroesophageal reflux disease)    Heart murmur    High cholesterol 04/25/2016   Hyperlipidemia    Mixed stress and urge urinary incontinence 09/09/2015   Sciatic nerve pain    Spinal stenosis    Past Surgical History:  Procedure Laterality Date   BIOPSY  05/12/2016   Procedure: BIOPSY;  Surgeon: Rogene Houston, MD;  [facility];  Service: Endoscopy;;  gastric   BIOPSY  10/04/2018   Procedure: BIOPSY;  Surgeon: Rogene Houston, MD;  [facility];  Service: Endoscopy;;  gastric    CARPAL TUNNEL RELEASE     right; 1998, left [place]   COLONOSCOPY WITH PROPOFOL N/A 10/04/2018   Procedure: COLONOSCOPY WITH PROPOFOL;  Surgeon: Rogene Houston, MD;  [facility];  Service: Endoscopy;  Laterality: N/A;  7:30   Colonscopy     ESOPHAGEAL DILATION N/A 05/12/2016   Procedure: ESOPHAGEAL DILATION;  Surgeon: Rogene Houston, MD;  [facility];  Service: Endoscopy;  Laterality: N/A;   ESOPHAGOGASTRODUODENOSCOPY N/A 05/12/2016   Procedure: ESOPHAGOGASTRODUODENOSCOPY (EGD);  Surgeon: Rogene Houston, MD;   [facility];  Service: Endoscopy;  Laterality: N/A;  9:30   ESOPHAGOGASTRODUODENOSCOPY (EGD) WITH PROPOFOL N/A 10/04/2018   Procedure: ESOPHAGOGASTRODUODENOSCOPY (EGD) WITH PROPOFOL;  Surgeon: Rogene Houston, MD;  [facility];  Service: Endoscopy;  Laterality: N/A;   EYE SURGERY     lasix   FLEXIBLE SIGMOIDOSCOPY N/A 01/28/2021   Procedure: FLEXIBLE SIGMOIDOSCOPY WITH PROPOFOL;  Surgeon: Rogene Houston, MD;  [facility];  Service: Endoscopy;  Laterality: N/A;  9:30   JOINT REPLACEMENT     both hips   LUMBAR LAMINECTOMY/DECOMPRESSION MICRODISCECTOMY N/A 08/05/2020   Procedure: LAMINECTOMY THORACIC TEN-ELEVEN;  Surgeon: Ashok Pall, MD;  Location: [place];  Service: Neurosurgery;  Laterality: N/A;   LUMBAR LAMINECTOMY/DECOMPRESSION MICRODISCECTOMY N/A 10/14/2020   Procedure: Right Lumbar 4-5, Lumbar 5 Sacral 1 Lumbar and thoracic laminectomy;  Surgeon: Ashok Pall, MD;  Location: [place];  Service: Neurosurgery;  Laterality: N/A;  Right Lumbar 4-5, Lumbar 5 Sacral 1 Lumbar and thoracic laminectomy   TOTAL HIP ARTHROPLASTY     right; March 2013; left March 2011   Patient Active Problem List   Diagnosis Date Noted   Encounter for counseling 01/09/2022   Pelvic relaxation due to cystocele, midline 04/15/2021   Postmenopausal 04/15/2021   Rectal pain 04/05/2021  Anal fissure 12/21/2020   Constipation 08/12/2020   Thoracic spinal stenosis 08/05/2020   Gait disturbance 11/04/2018   Numbness and tingling 11/04/2018   Lumbar radiculopathy 11/04/2018   Cervical stenosis of spinal canal 11/04/2018   PUD (peptic ulcer disease) 05/10/2018   Anemia 05/10/2018   Special screening for malignant neoplasms, colon 05/10/2018   Abdominal pain 04/07/2018   Hypokalemia 04/07/2018   Osteoarthritis 04/07/2018   Elevated blood pressure reading 04/07/2018   Family hx of colon cancer 02/27/2018   Screening for colorectal cancer 11/06/2017   Encounter for well woman exam  with routine gynecological exam 11/06/2017   High cholesterol 04/25/2016   Dysphagia 04/25/2016   Esophageal dysphagia 04/25/2016   Mixed stress and urge urinary incontinence 09/09/2015   REFERRING DIAG: D62.229 (ICD-10-CM) - Levator spasm   THERAPY DIAG:  No diagnosis found.   Rationale for Evaluation and Treatment Rehabilitation   ONSET DATE: 2021   SUBJECTIVE:                                                                                                                                                                                            SUBJECTIVE STATEMENT: My incontinence has gotten worse. The tightness continues in the rectum. When I urinate it is a little. Therapy is helping temporarily. Wand causes pain in the left side of pelvic floor. I am having continuous leakage.     PAIN:  Are you having pain? No   PRECAUTIONS: None   WEIGHT BEARING RESTRICTIONS No   FALLS:  Has patient fallen in last 6 months? No   LIVING ENVIRONMENT: Lives with: lives with their family     OCCUPATION: retired; no exercise   PLOF: Independent   PATIENT GOALS rectum to feel normal   PERTINENT HISTORY:  C-section; bil. Hips replaced; lumbar laminectomy;    BOWEL MOVEMENT Pain with bowel movement: No Type of bowel movement:Type (Bristol Stool Scale) type 6, Frequency daily for several times, Strain No, and Splinting no Fully empty rectum: No, uses the squatty potty Leakage: small amounts of stool come out when urinate in the commode Fiber supplement: Yes: miralax, metamucil   URINATION Pain with urination: No Fully empty bladder: Yes:   Stream: Strong Urgency: No Frequency: Day time voids 6-7 small amounts.  Nocturia: 2 times per night to void Leakage: Urge to void, Walking to the bathroom, Coughing, and Sneezing, continuous urinary leakage,  Pads: 2 liners/ mini-pads per day   INTERCOURSE not sexually active   PREGNANCY C-section deliveries 1 still born         OBJECTIVE: (objective measures completed at initial evaluation unless otherwise dated)  DIAGNOSTIC FINDINGS:  Pelvic floor strength I/V, puborectalis III/V external anal sphincter IV/V  Pelvic floor musculature: + tension in puborectalis on rectal exam, On vaginal exam, Right levator tender, Right obturator tender, Left levator tender, Left obturator tender PVR of 31 ml was obtained by bladder scan 01/12/2022 x-ray showed a utering fibroid that is hardened.        COGNITION:            Overall cognitive status: Within functional limits for tasks assessed                          SENSATION:            Light touch: Appears intact            Proprioception: Appears intact                    POSTURE: No Significant postural limitations               PELVIC ALIGNMENT: Bilateral ASIS are equal   LUMBARAROM/PROM not assessed due to having an epidural this morning.      LOWER EXTREMITY ROM: Within functional limits     LOWER EXTREMITY MMT: bilateral hip strength is 4/5      PALPATION:   General  Patient over contracts the upper abdominals and does not contract the lower abdominals.                  External Perineal Exam vaginal dryness, tightness along the perineal body and superior transverse                             Internal Pelvic Floor tenderness located in the left puborectalis, bilateral obturator internist; tightness in bilateral iliococcygeus, puborectalis,    Patient confirms identification and approves PT to assess internal pelvic floor and treatment Yes   PELVIC MMT:   MMT eval 01/03/2022 01/26/2022 02/07/2022 02/21/2022  Vaginal 2/5 anterior/posterior; 1/5 laterally 2/5 with circular contraction holding for 2 seconds; at times she will bulge her pelvic floor 3/5   3/5 with circular contraction Holding 4 seconds  Internal Anal Sphincter 3/5   3/5 4/5   External Anal Sphincter 3/5   3/5 4/5   Puborectalis 2/5   3/5 4/5   (Blank rows = not tested)          TONE: increased   PROLAPSE: none   TODAY'S TREATMENT  02/20/2022 Manual: Internal pelvic floor techniques:No emotional/communication barriers or cognitive limitation. Patient is motivated to learn. Patient understands and agrees with treatment goals and plan. PT explains patient will be examined in standing, sitting, and lying down to see how their muscles and joints work. When they are ready, they will be asked to remove their underwear so PT can examine their perineum. The patient is also given the option of providing their own chaperone as one is not provided in our facility. The patient also has the right and is explained the right to defer or refuse any part of the evaluation or treatment including the internal exam. With the patient's consent, PT will use one gloved finger to gently assess the muscles of the pelvic floor, seeing how well it contracts and relaxes and if there is muscle symmetry. After, the patient will get dressed and PT and patient will discuss exam findings and plan of care. PT and patient discuss plan of care, schedule, attendance  policy and HEP activities.  Going through the vaginal working on the ischiocavernosus, along the perineal body, superior transverse perineum, and levator ani to release after dry needling and lengthen the muscles.  Trigger Point Dry-Needling  Treatment instructions: Expect mild to moderate muscle soreness. S/S of pneumothorax if dry needled over a lung field, and to seek immediate medical attention should they occur. Patient verbalized understanding of these instructions and education.  Patient Consent Given: Yes Education handout provided: Previously provided Muscles treated: left ischiocavernosus, perineal body, superior transverse, pubococcygeus Electrical stimulation performed: No Parameters: N/A Treatment response/outcome: elongation of muscles and trigger point response Neuromuscular re-education: Pelvic floor contraction  training: Tactile cues to the vaginal opening to work on circular contraction with a lift.  Working on relaxation of pelvic floor after contraction    02/14/2022 Manual: Soft tissue mobilization: To address for dry needling Manual work with the addaday to the left gluteus medius, left piriformis, gluteus maximus, left hip rotators  Internal pelvic floor techniques:No emotional/communication barriers or cognitive limitation. Patient is motivated to learn. Patient understands and agrees with treatment goals and plan. PT explains patient will be examined in standing, sitting, and lying down to see how their muscles and joints work. When they are ready, they will be asked to remove their underwear so PT can examine their perineum. The patient is also given the option of providing their own chaperone as one is not provided in our facility. The patient also has the right and is explained the right to defer or refuse any part of the evaluation or treatment including the internal exam. With the patient's consent, PT will use one gloved finger to gently assess the muscles of the pelvic floor, seeing how well it contracts and relaxes and if there is muscle symmetry. After, the patient will get dressed and PT and patient will discuss exam findings and plan of care. PT and patient discuss plan of care, schedule, attendance policy and HEP activities.  Going through the anus to work on the puborectalis, external anal sphincter, along the iliococcygeus, coccygeus, obturator internist to elongate and reduce trigger points Trigger Point Dry-Needling  Treatment instructions: Expect mild to moderate muscle soreness. S/S of pneumothorax if dry needled over a lung field, and to seek immediate medical attention should they occur. Patient verbalized understanding of these instructions and education.   Patient Consent Given: Yes Education handout provided: Previously provided Muscles treated: left puborectalis, perineal  body, left coccygeus, left obturator internist, left piriformis, left gluteus medius, left lumbar multifidi Electrical stimulation performed: No Parameters: N/A Treatment response/outcome: trigger point release and elongation of muscles     02/07/2022 Manual: Soft tissue mobilization: manual work to the left gluteal and SI joint in right sidely, to assess dry needling to pelvic floor muscles.  Myofascial release: using a suction cup to the left gluteal, left SI joint, and posterior left hip to release the fascial Internal pelvic floor techniques: No emotional/communication barriers or cognitive limitation. Patient is motivated to learn. Patient understands and agrees with treatment goals and plan. PT explains patient will be examined in standing, sitting, and lying down to see how their muscles and joints work. When they are ready, they will be asked to remove their underwear so PT can examine their perineum. The patient is also given the option of providing their own chaperone as one is not provided in our facility. The patient also has the right and is explained the right to defer or refuse any part of the evaluation  or treatment including the internal exam. With the patient's consent, PT will use one gloved finger to gently assess the muscles of the pelvic floor, seeing how well it contracts and relaxes and if there is muscle symmetry. After, the patient will get dressed and PT and patient will discuss exam findings and plan of care. PT and patient discuss plan of care, schedule, attendance policy and HEP activities. Going into the anal canal working on the left puborectalis, iliococcygeus, anococcygeal ligament, coccygeus to elongate after dry needling  Trigger Point Dry-Needling  Treatment instructions: Expect mild to moderate muscle soreness. S/S of pneumothorax if dry needled over a lung field, and to seek immediate medical attention should they occur. Patient verbalized understanding of these  instructions and education.   Patient Consent Given: Yes Education handout provided: Yes Muscles treated: left coccygeus, puborectalis, and iliococcygeus Electrical stimulation performed: No Parameters: N/A Treatment response/outcome: elongation of muscle and trigger point response   Neuromuscular re-education: Pelvic floor contraction training: tactile cue to the rectal muscles for a circular contraction and lift in left sidely and sitting      PATIENT EDUCATION:  01/26/2022 Education details: Access Code: [email or web address] Person educated: Patient Education method: Explanation Education comprehension: verbalized understanding     HOME EXERCISE PROGRAM: 01/26/2022 Access Code: [email or web address] URL: [email or web address] Date: 01/26/2022 Prepared by: Earlie Counts   Exercises - Supine Hip Adduction Isometric with Ball  - 1 x daily - 7 x weekly - 1 sets - 10 reps - 4 sec hold - Supine Transversus Abdominis Bracing - Hands on Thighs  - 1 x daily - 7 x weekly - 1 sets - 10 reps - 5 sec hold     ASSESSMENT:   CLINICAL IMPRESSION: Patient is a 71 y.o. female who was seen today for physical therapy  treatment for levator spasm.   Patient felt her bulge feeling in the vagina left after the dry needling. She continues to have the pressure in the left pelvic floor rectally and feels better if she keeps the weight off the left buttocks. Vaginal strength is 3/5 holding for 4 seconds.   She now has continuous leakage with urine now. She feels her urinary leakage is worse. She gets temporary relief of the pressure feeling in the left gluteal after therapy. Patient is waiting for an appointment with a colorectal surgeon. Patient will benefit from skilled therapy to improve pelvic floor coordination to relax the pelvic floor and have a full bowel movement and reduce urinary leakage.      OBJECTIVE IMPAIRMENTS decreased coordination, decreased endurance, increased fascial restrictions, increased  muscle spasms, and impaired tone.    ACTIVITY LIMITATIONS toileting   PARTICIPATION LIMITATIONS: community activity   PERSONAL FACTORS Fitness and 3+ comorbidities: C-section; bil. Hips replaced; lumbar laminectomy  are also affecting patient's functional outcome.    REHAB POTENTIAL: Excellent   CLINICAL DECISION MAKING: Stable/uncomplicated   EVALUATION COMPLEXITY: Low     GOALS: Goals reviewed with patient? Yes   SHORT TERM GOALS: Target date: 01/23/2022   Patient independent with the use of the pelvic wand to work on the pelvic floor tissue through the anus and vagina.  Baseline: Goal status: met 01/03/2022   2.  Patient able to perform diaphragmatic breathing in sitting to relax the pelvic floor.  Baseline:  Goal status: Met 01/11/2022   3.  Patient reports the tightness in the pelvic floor has reduced >/= 25% due to reduction of the trigger points.  Baseline:  Goal status: ongoing 01/11/2022  LONG TERM GOALS: Target date: 03/20/2022    Patient is independent with her HEP to relax the pelvic floor and strengthen.  Baseline:  Goal status: ongoign 02/07/2022   2.  Patient reports no urinary leakage due to pelvic floor strength is >/= 3/5.  Baseline:  Goal status: ongoing 02/07/2022   3.  Patient is able to push the full stool our and not feel she has some remaining due to the ability to fully relax her pelvic floor.  Baseline:  Goal status: ongoing 02/07/2022   4.  Patient is able to sit without feeling a band or ball in the anal region due to reduction of tightness in the pelvic floor  Baseline:  Goal status: ongoing 02/07/2022     PLAN: PT FREQUENCY: 1x/week   PT DURATION: 12 weeks   PLANNED INTERVENTIONS: Therapeutic exercises, Therapeutic activity, Neuromuscular re-education, Patient/Family education, Self Care, Joint mobilization, Dry Needling, Electrical stimulation, Spinal mobilization, Cryotherapy, Moist heat, Biofeedback, and Manual therapy    PLAN FOR NEXT SESSION: continue with the  dry needling,  work on holding pelvic floor contraction and doing the elevator, see patient one more time to see if dry needling vaginally helped.     Earlie Counts, PT 02/21/22 9:55 AM

## 2022-02-22 ENCOUNTER — Ambulatory Visit (INDEPENDENT_AMBULATORY_CARE_PROVIDER_SITE_OTHER): Payer: Medicare Other | Admitting: Obstetrics and Gynecology

## 2022-02-22 ENCOUNTER — Encounter: Payer: Self-pay | Admitting: Obstetrics and Gynecology

## 2022-02-22 VITALS — BP 192/97 | HR 70

## 2022-02-22 DIAGNOSIS — N3281 Overactive bladder: Secondary | ICD-10-CM | POA: Diagnosis not present

## 2022-02-22 DIAGNOSIS — R350 Frequency of micturition: Secondary | ICD-10-CM | POA: Diagnosis not present

## 2022-02-22 DIAGNOSIS — R159 Full incontinence of feces: Secondary | ICD-10-CM

## 2022-02-22 LAB — POCT URINALYSIS DIPSTICK
Bilirubin, UA: NEGATIVE
Blood, UA: NEGATIVE
Glucose, UA: NEGATIVE
Ketones, UA: NEGATIVE
Leukocytes, UA: NEGATIVE
Nitrite, UA: NEGATIVE
Protein, UA: NEGATIVE
Spec Grav, UA: 1.025 (ref 1.010–1.025)
Urobilinogen, UA: 0.2 E.U./dL
pH, UA: 6 (ref 5.0–8.0)

## 2022-02-22 MED ORDER — VIBEGRON 75 MG PO TABS: 1.0000 | ORAL_TABLET | Freq: Every day | ORAL | 5 refills | Status: DC

## 2022-02-22 NOTE — Progress Notes (Signed)
Cynthia Cochran Return Visit  SUBJECTIVE  History of Present Illness: Cynthia Cochran is a 71 y.o. female seen in follow-up for pelvic/ levator pain and incontinence. Previously underwent trigger point injections for levator spasm and has been attending pelvic PT. Rectal/ perineal pain has been about the same. She has a referral to colorectal.   Previously had been on oxybutynin for several years. Did not feel it was helping and not recommended after age 81 so medication was stopped. She is having to wear depends of a pad all the time- leakage has worsened since stopping medication.   She is also having some issues with bowel leakage as well. Has been doing miralax and metamucil daily.   Past Medical History: Patient  has a past medical history of Anxiety, Arthritis, Blood transfusion without reported diagnosis (1982), Dysphagia (04/25/2016), Gastric ulcer, GERD (gastroesophageal reflux disease), Heart murmur, High cholesterol (04/25/2016), Hyperlipidemia, Mixed stress and urge urinary incontinence (09/09/2015), Sciatic nerve pain, and Spinal stenosis.   Past Surgical History: She  has a past surgical history that includes Cesarean section (1982); Carpal tunnel release; Total hip arthroplasty; Colonscopy; Esophagogastroduodenoscopy (N/A, 05/12/2016); Esophageal dilation (N/A, 05/12/2016); biopsy (05/12/2016); Colonoscopy with propofol (N/A, 10/04/2018); Esophagogastroduodenoscopy (egd) with propofol (N/A, 10/04/2018); biopsy (10/04/2018); Joint replacement; Eye surgery; Lumbar laminectomy/decompression microdiscectomy (N/A, 08/05/2020); Lumbar laminectomy/decompression microdiscectomy (N/A, 10/14/2020); and Flexible sigmoidoscopy (N/A, 01/28/2021).   Medications: She has a current medication list which includes the following prescription(s): acetaminophen, atorvastatin, dialyvite vitamin d 5000, collagen, hydrocodone-acetaminophen, losartan, polyethylene glycol, psyllium, specialty vitamins products,  tizanidine, turmeric curcumin, and vibegron.   Allergies: Patient is allergic to lyrica [pregabalin], mobic [meloxicam], and nsaids.   Social History: Patient  reports that she has never smoked. She has never used smokeless tobacco. She reports current alcohol use. She reports that she does not use drugs.      OBJECTIVE     Physical Exam: Vitals:   02/22/22 0829  BP: (!) 192/97  Pulse: 70   Gen: No apparent distress, A&O x 3.  Detailed Urogynecologic Evaluation:  Deferred.    POC urine: negative  ASSESSMENT AND PLAN    Cynthia Cochran is a 71 y.o. with:  1. Overactive bladder   2. Urinary frequency   3. Incontinence of feces, unspecified fecal incontinence type    - We discussed the symptoms of overactive bladder (OAB), which include urinary urgency, urinary frequency, nocturia, with or without urge incontinence.  While we do not know the exact etiology of OAB, several treatment options exist. We discussed management including behavioral therapy (decreasing bladder irritants, urge suppression strategies, timed voids, bladder retraining), physical therapy, medication; for refractory cases posterior tibial nerve stimulation, sacral neuromodulation, and intravesical botulinum toxin injection.  - We discussed that she would be a good candidate for SNM since she has both bladder and bowel leakage issues. She is considering this options but wants evaluation with colorectal first.  - Start Gemtesa 75mg  daily. Not a candidate for myrbetriq due to elevated BP. Samples provided for her. Her insurance is changing in Jan and we discussed we may need to get authorization for this medication.   Return 6 weeks.   Feb, MD

## 2022-02-28 ENCOUNTER — Ambulatory Visit: Payer: Medicare Other

## 2022-02-28 ENCOUNTER — Encounter: Payer: Medicare Other | Admitting: Physical Therapy

## 2022-02-28 ENCOUNTER — Encounter: Payer: Self-pay | Admitting: Physical Therapy

## 2022-02-28 DIAGNOSIS — R252 Cramp and spasm: Secondary | ICD-10-CM

## 2022-02-28 DIAGNOSIS — R278 Other lack of coordination: Secondary | ICD-10-CM

## 2022-02-28 DIAGNOSIS — M62838 Other muscle spasm: Secondary | ICD-10-CM | POA: Diagnosis not present

## 2022-02-28 NOTE — Therapy (Addendum)
OUTPATIENT PHYSICAL THERAPY TREATMENT NOTE   Patient Name: Cynthia Cochran MRN: [id number] DOB:11/14/1950, 71 y.o., female Today's Date: 02/28/2022  PCP: Celene Squibb, MD   REFERRING PROVIDER: Jaquita Folds, MD    END OF SESSION:   PT End of Session - 02/28/22 0836     Visit Number 9    Date for PT Re-Evaluation 03/20/22    Authorization Type Medicare    Authorization - Visit Number 9    Authorization - Number of Visits 10    PT Start Time 0830    PT Stop Time 0915    PT Time Calculation (min) 45 min    Activity Tolerance Patient tolerated treatment well    Behavior During Therapy [facility] for tasks assessed/performed             Past Medical History:  Diagnosis Date   Anxiety    Arthritis    Blood transfusion without reported diagnosis 1982   after c section   Dysphagia 04/25/2016   Gastric ulcer    GERD (gastroesophageal reflux disease)    Heart murmur    High cholesterol 04/25/2016   Hyperlipidemia    Mixed stress and urge urinary incontinence 09/09/2015   Sciatic nerve pain    Spinal stenosis    Past Surgical History:  Procedure Laterality Date   BIOPSY  05/12/2016   Procedure: BIOPSY;  Surgeon: Rogene Houston, MD;  [facility];  Service: Endoscopy;;  gastric   BIOPSY  10/04/2018   Procedure: BIOPSY;  Surgeon: Rogene Houston, MD;  [facility];  Service: Endoscopy;;  gastric    CARPAL TUNNEL RELEASE     right; 1998, left [place]   COLONOSCOPY WITH PROPOFOL N/A 10/04/2018   Procedure: COLONOSCOPY WITH PROPOFOL;  Surgeon: Rogene Houston, MD;  [facility];  Service: Endoscopy;  Laterality: N/A;  7:30   Colonscopy     ESOPHAGEAL DILATION N/A 05/12/2016   Procedure: ESOPHAGEAL DILATION;  Surgeon: Rogene Houston, MD;  [facility];  Service: Endoscopy;  Laterality: N/A;   ESOPHAGOGASTRODUODENOSCOPY N/A 05/12/2016   Procedure: ESOPHAGOGASTRODUODENOSCOPY (EGD);  Surgeon: Rogene Houston, MD;   [facility];  Service: Endoscopy;  Laterality: N/A;  9:30   ESOPHAGOGASTRODUODENOSCOPY (EGD) WITH PROPOFOL N/A 10/04/2018   Procedure: ESOPHAGOGASTRODUODENOSCOPY (EGD) WITH PROPOFOL;  Surgeon: Rogene Houston, MD;  [facility];  Service: Endoscopy;  Laterality: N/A;   EYE SURGERY     lasix   FLEXIBLE SIGMOIDOSCOPY N/A 01/28/2021   Procedure: FLEXIBLE SIGMOIDOSCOPY WITH PROPOFOL;  Surgeon: Rogene Houston, MD;  [facility];  Service: Endoscopy;  Laterality: N/A;  9:30   JOINT REPLACEMENT     both hips   LUMBAR LAMINECTOMY/DECOMPRESSION MICRODISCECTOMY N/A 08/05/2020   Procedure: LAMINECTOMY THORACIC TEN-ELEVEN;  Surgeon: Ashok Pall, MD;  Location: [place];  Service: Neurosurgery;  Laterality: N/A;   LUMBAR LAMINECTOMY/DECOMPRESSION MICRODISCECTOMY N/A 10/14/2020   Procedure: Right Lumbar 4-5, Lumbar 5 Sacral 1 Lumbar and thoracic laminectomy;  Surgeon: Ashok Pall, MD;  Location: [place];  Service: Neurosurgery;  Laterality: N/A;  Right Lumbar 4-5, Lumbar 5 Sacral 1 Lumbar and thoracic laminectomy   TOTAL HIP ARTHROPLASTY     right; March 2013; left March 2011   Patient Active Problem List   Diagnosis Date Noted   Encounter for counseling 01/09/2022   Pelvic relaxation due to cystocele, midline 04/15/2021   Postmenopausal 04/15/2021   Rectal  pain 04/05/2021   Anal fissure 12/21/2020   Constipation 08/12/2020   Thoracic spinal stenosis 08/05/2020   Gait disturbance 11/04/2018   Numbness and tingling 11/04/2018   Lumbar radiculopathy 11/04/2018   Cervical stenosis of spinal canal 11/04/2018   PUD (peptic ulcer disease) 05/10/2018   Anemia 05/10/2018   Special screening for malignant neoplasms, colon 05/10/2018   Abdominal pain 04/07/2018   Hypokalemia 04/07/2018   Osteoarthritis 04/07/2018   Elevated blood pressure reading 04/07/2018   Family hx of colon cancer 02/27/2018   Screening for colorectal cancer 11/06/2017   Encounter for well woman exam  with routine gynecological exam 11/06/2017   High cholesterol 04/25/2016   Dysphagia 04/25/2016   Esophageal dysphagia 04/25/2016   Mixed stress and urge urinary incontinence 09/09/2015   REFERRING DIAG: I33.825 (ICD-10-CM) - Levator spasm   THERAPY DIAG:  No diagnosis found.   Rationale for Evaluation and Treatment Rehabilitation   ONSET DATE: 2021   SUBJECTIVE:                                                                                                                                                                                            SUBJECTIVE STATEMENT: I am having diarrhea all the time. I feel the pressure as the day goes on. In the morning the pressure is not at bad.    PAIN:  Are you having pain? No   PRECAUTIONS: None   WEIGHT BEARING RESTRICTIONS No   FALLS:  Has patient fallen in last 6 months? No   LIVING ENVIRONMENT: Lives with: lives with their family     OCCUPATION: retired; no exercise   PLOF: Independent   PATIENT GOALS rectum to feel normal   PERTINENT HISTORY:  C-section; bil. Hips replaced; lumbar laminectomy;    BOWEL MOVEMENT Pain with bowel movement: No Type of bowel movement:Type (Bristol Stool Scale) type 6, Frequency daily for several times, Strain No, and Splinting no Fully empty rectum: No, uses the squatty potty Leakage: small amounts of stool come out when urinate in the commode Fiber supplement: Yes: miralax, metamucil   URINATION Pain with urination: No Fully empty bladder: Yes:   Stream: Strong Urgency: No Frequency: Day time voids 6-7 small amounts.  Nocturia: 2 times per night to void Leakage: Urge to void, Walking to the bathroom, Coughing, and Sneezing, continuous urinary leakage,  Pads: 2 liners/ mini-pads per day   INTERCOURSE not sexually active   PREGNANCY C-section deliveries 1 still born        OBJECTIVE: (objective measures completed at initial evaluation unless otherwise dated)     DIAGNOSTIC  FINDINGS:  Pelvic floor  strength I/V, puborectalis III/V external anal sphincter IV/V  Pelvic floor musculature: + tension in puborectalis on rectal exam, On vaginal exam, Right levator tender, Right obturator tender, Left levator tender, Left obturator tender PVR of 31 ml was obtained by bladder scan 01/12/2022 x-ray showed a utering fibroid that is hardened.        COGNITION:            Overall cognitive status: Within functional limits for tasks assessed                          SENSATION:            Light touch: Appears intact            Proprioception: Appears intact                    POSTURE: No Significant postural limitations               PELVIC ALIGNMENT: Bilateral ASIS are equal   LUMBARAROM/PROM not assessed due to having an epidural this morning.      LOWER EXTREMITY ROM: Within functional limits     LOWER EXTREMITY MMT: bilateral hip strength is 4/5      PALPATION:   General  Patient over contracts the upper abdominals and does not contract the lower abdominals.                  External Perineal Exam vaginal dryness, tightness along the perineal body and superior transverse                             Internal Pelvic Floor tenderness located in the left puborectalis, bilateral obturator internist; tightness in bilateral iliococcygeus, puborectalis,    Patient confirms identification and approves PT to assess internal pelvic floor and treatment Yes   PELVIC MMT:   MMT eval 01/03/2022 01/26/2022 02/07/2022 02/21/2022  Vaginal 2/5 anterior/posterior; 1/5 laterally 2/5 with circular contraction holding for 2 seconds; at times she will bulge her pelvic floor 3/5   3/5 with circular contraction Holding 4 seconds  Internal Anal Sphincter 3/5   3/5 4/5    External Anal Sphincter 3/5   3/5 4/5    Puborectalis 2/5   3/5 4/5    (Blank rows = not tested)         TONE: increased   PROLAPSE: none   TODAY'S TREATMENT  02/28/2022 Manual: Soft tissue  mobilization: To address for dry needling Manual work to the lumbar paraspinals, bil. Quadratus, left gluteal, left piriformis, left coccygeus Trigger Point Dry-Needling  Treatment instructions: Expect mild to moderate muscle soreness. S/S of pneumothorax if dry needled over a lung field, and to seek immediate medical attention should they occur. Patient verbalized understanding of these instructions and education.  Patient Consent Given: Yes Education handout provided: Previously provided Muscles treated: bilateral lumbar multifidi, left gluteus medius, left gluteus maximus, left piriformis, left coccygeus Electrical stimulation performed: No Parameters: N/A Treatment response/outcome: elongation of muscle and trigger point response  Neuromuscular re-education: Core retraining: Lean on counter lift opposite extremity keeping spinal neutral but difficult then just lifted the one leg for hip extension 10x each side Standing side step to work on the gluteus medius with core engagement    02/20/2022 Manual: Internal pelvic floor techniques:No emotional/communication barriers or cognitive limitation. Patient is motivated to learn. Patient understands and agrees with treatment goals and  plan. PT explains patient will be examined in standing, sitting, and lying down to see how their muscles and joints work. When they are ready, they will be asked to remove their underwear so PT can examine their perineum. The patient is also given the option of providing their own chaperone as one is not provided in our facility. The patient also has the right and is explained the right to defer or refuse any part of the evaluation or treatment including the internal exam. With the patient's consent, PT will use one gloved finger to gently assess the muscles of the pelvic floor, seeing how well it contracts and relaxes and if there is muscle symmetry. After, the patient will get dressed and PT and patient will discuss  exam findings and plan of care. PT and patient discuss plan of care, schedule, attendance policy and HEP activities.  Going through the vaginal working on the ischiocavernosus, along the perineal body, superior transverse perineum, and levator ani to release after dry needling and lengthen the muscles.  Trigger Point Dry-Needling  Treatment instructions: Expect mild to moderate muscle soreness. S/S of pneumothorax if dry needled over a lung field, and to seek immediate medical attention should they occur. Patient verbalized understanding of these instructions and education.   Patient Consent Given: Yes Education handout provided: Previously provided Muscles treated: left ischiocavernosus, perineal body, superior transverse, pubococcygeus Electrical stimulation performed: No Parameters: N/A Treatment response/outcome: elongation of muscles and trigger point response Neuromuscular re-education: Pelvic floor contraction training: Tactile cues to the vaginal opening to work on circular contraction with a lift.  Working on relaxation of pelvic floor after contraction    02/14/2022 Manual: Soft tissue mobilization: To address for dry needling Manual work with the addaday to the left gluteus medius, left piriformis, gluteus maximus, left hip rotators  Internal pelvic floor techniques:No emotional/communication barriers or cognitive limitation. Patient is motivated to learn. Patient understands and agrees with treatment goals and plan. PT explains patient will be examined in standing, sitting, and lying down to see how their muscles and joints work. When they are ready, they will be asked to remove their underwear so PT can examine their perineum. The patient is also given the option of providing their own chaperone as one is not provided in our facility. The patient also has the right and is explained the right to defer or refuse any part of the evaluation or treatment including the internal exam. With  the patient's consent, PT will use one gloved finger to gently assess the muscles of the pelvic floor, seeing how well it contracts and relaxes and if there is muscle symmetry. After, the patient will get dressed and PT and patient will discuss exam findings and plan of care. PT and patient discuss plan of care, schedule, attendance policy and HEP activities.  Going through the anus to work on the puborectalis, external anal sphincter, along the iliococcygeus, coccygeus, obturator internist to elongate and reduce trigger points Trigger Point Dry-Needling  Treatment instructions: Expect mild to moderate muscle soreness. S/S of pneumothorax if dry needled over a lung field, and to seek immediate medical attention should they occur. Patient verbalized understanding of these instructions and education.   Patient Consent Given: Yes Education handout provided: Previously provided Muscles treated: left puborectalis, perineal body, left coccygeus, left obturator internist, left piriformis, left gluteus medius, left lumbar multifidi Electrical stimulation performed: No Parameters: N/A Treatment response/outcome: trigger point release and elongation of muscles      PATIENT EDUCATION:  01/26/2022 Education details: Access Code: [email or web address] Person educated: Patient Education method: Explanation Education comprehension: verbalized understanding     HOME EXERCISE PROGRAM: 01/26/2022 Access Code: [email or web address] URL: [email or web address] Date: 01/26/2022 Prepared by: Earlie Counts   Exercises - Supine Hip Adduction Isometric with Ball  - 1 x daily - 7 x weekly - 1 sets - 10 reps - 4 sec hold - Supine Transversus Abdominis Bracing - Hands on Thighs  - 1 x daily - 7 x weekly - 1 sets - 10 reps - 5 sec hold     ASSESSMENT:   CLINICAL IMPRESSION: Patient is a 71 y.o. female who was seen today for physical therapy  treatment for levator spasm.  Patient sits with her weight on the right buttocks.   Patient responded well with the dry needling. She felt less of a bulge in the rectal area. Patient has decreased contraction of the gluteal. She had many trigger points in the gluteal and lumbar multifidi. Patient is having issue with loose stools and it will happen when she urinates. She is taking medication for her urinary leakage. She will be seeing a colorectal surgeon and come back to therapy after the consultation. Patient will benefit from skilled therapy to improve pelvic floor strength and coordination.    OBJECTIVE IMPAIRMENTS decreased coordination, decreased endurance, increased fascial restrictions, increased muscle spasms, and impaired tone.    ACTIVITY LIMITATIONS toileting   PARTICIPATION LIMITATIONS: community activity   PERSONAL FACTORS Fitness and 3+ comorbidities: C-section; bil. Hips replaced; lumbar laminectomy  are also affecting patient's functional outcome.    REHAB POTENTIAL: Excellent   CLINICAL DECISION MAKING: Stable/uncomplicated   EVALUATION COMPLEXITY: Low     GOALS: Goals reviewed with patient? Yes   SHORT TERM GOALS: Target date: 01/23/2022   Patient independent with the use of the pelvic wand to work on the pelvic floor tissue through the anus and vagina.  Baseline: Goal status: met 01/03/2022   2.  Patient able to perform diaphragmatic breathing in sitting to relax the pelvic floor.  Baseline:  Goal status: Met 01/11/2022   3.  Patient reports the tightness in the pelvic floor has reduced >/= 25% due to reduction of the trigger points.  Baseline:  Goal status: ongoing 01/11/2022       LONG TERM GOALS: Target date: 03/20/2022    Patient is independent with her HEP to relax the pelvic floor and strengthen.  Baseline:  Goal status: ongoign 02/07/2022   2.  Patient reports no urinary leakage due to pelvic floor strength is >/= 3/5.  Baseline:  Goal status: ongoing 02/07/2022   3.  Patient is able to push the full stool our and not feel she has  some remaining due to the ability to fully relax her pelvic floor.  Baseline:  Goal status: ongoing 02/07/2022   4.  Patient is able to sit without feeling a band or ball in the anal region due to reduction of tightness in the pelvic floor  Baseline:  Goal status: ongoing 02/07/2022     PLAN: PT FREQUENCY: 1x/week   PT DURATION: 12 weeks   PLANNED INTERVENTIONS: Therapeutic exercises, Therapeutic activity, Neuromuscular re-education, Patient/Family education, Self Care, Joint mobilization, Dry Needling, Electrical stimulation, Spinal mobilization, Cryotherapy, Moist heat, Biofeedback, and Manual therapy   PLAN FOR NEXT SESSION: continue with the  dry needling,  work on holding pelvic floor contraction and doing the elevator, See what the colorectal doctor said, renewal and 10th visit note, 10/16 [place], PT 02/28/22  11:29 AM  PHYSICAL THERAPY DISCHARGE SUMMARY  Visits from Start of Care: 9  Current functional level related to goals / functional outcomes: See above. Patient continues to have the levator spasm and having it evaluated further.    Remaining deficits: See above.    Education / Equipment: HEP   Patient agrees to discharge. Patient goals were not met. Patient is being discharged due to maximized rehab potential. Thank you for the referral. Earlie Counts, PT 03/20/22 11:49 AM

## 2022-03-01 ENCOUNTER — Ambulatory Visit
Admission: RE | Admit: 2022-03-01 | Discharge: 2022-03-01 | Disposition: A | Discharge status: Home / Self Care | Payer: Medicare Other | Source: Ambulatory Visit | Attending: Internal Medicine | Admitting: Internal Medicine

## 2022-03-01 DIAGNOSIS — Z1231 Encounter for screening mammogram for malignant neoplasm of breast: Secondary | ICD-10-CM

## 2022-03-14 ENCOUNTER — Ambulatory Visit (INDEPENDENT_AMBULATORY_CARE_PROVIDER_SITE_OTHER): Payer: Medicare Other | Admitting: Surgical

## 2022-03-14 VITALS — BP 215/85 | HR 66 | Ht 62.0 in

## 2022-03-14 DIAGNOSIS — Z719 Counseling, unspecified: Secondary | ICD-10-CM

## 2022-03-14 NOTE — Progress Notes (Signed)
Preoperative Dx: Rosacea  Postoperative Dx:  same  Procedure: laser to face   Anesthesia: none  Description of Procedure:  Risks and complications were explained to the patient. Consent was confirmed. Time out was called and all information was confirmed to be correct. The area  area was prepped with alcohol and wiped dry.    The 560 nm BBL laser was set at 7.5 J/cm2, 10 pulse width, 15 degree C. The face was lasered for 208 passes, the 560 nm laser was then set at 14 j/cm2, 20 pulse width, 20 degree C for a total of 48 passes to the bilateral cheeks where rosacea was present.  The 560nm laser was set at 17 j/cm2, 20 pulse width, 15 degree C for a total of 3 pulses on the patients right hand bruise.  The patient tolerated the procedure well and there were no complications.

## 2022-03-15 ENCOUNTER — Telehealth: Payer: Self-pay | Admitting: *Deleted

## 2022-03-15 ENCOUNTER — Encounter: Payer: Self-pay | Admitting: Plastic Surgery

## 2022-03-15 NOTE — Telephone Encounter (Signed)
Call received from Ms. Kass who states she had a laser treatment yesterday and today her eyes are puffy and she has some broken blood vessels. Advised her to attempt to upload a picture to mychart if she can. Pt would like a call back if possible [phone]

## 2022-03-15 NOTE — Telephone Encounter (Signed)
Spoke with pt, discussed with her those are normal responses to BBL.

## 2022-03-17 ENCOUNTER — Telehealth: Payer: Self-pay | Admitting: Plastic Surgery

## 2022-03-17 NOTE — Telephone Encounter (Signed)
Ptn contacted by [facility] due to message.  She is unhappy and not sure that swelling and red is normal - unhappy with procedure.  I asked her to come back in and let Dr. D review what is happening.  She could not come today but will come in Saturday -- advised weekend clinic phones will not be on - mychart message if unable to attend

## 2022-03-18 ENCOUNTER — Encounter: Payer: Medicare Other | Admitting: Plastic Surgery

## 2022-03-18 ENCOUNTER — Telehealth: Payer: Self-pay | Admitting: Plastic Surgery

## 2022-03-18 NOTE — Telephone Encounter (Signed)
Called the pt to see if she would be willing to come in sooner than her scheduled appointment and she stated that she lives 45 mins from the office and was thinking about just waiting 2-3 day up to a week from the procedure to see if everything will work itself out and she will send a mychart message to let Dr. Marla Roe know of her decision.  She will give the office a call on Tuesday (03/21/2022) or Wednesday (03/22/2022) to speak with someone in the office.  At this present time she is cancelling the appointment for today.

## 2022-03-19 ENCOUNTER — Ambulatory Visit
Admission: RE | Admit: 2022-03-19 | Discharge: 2022-03-19 | Disposition: A | Discharge status: Home / Self Care | Payer: Medicare Other | Source: Ambulatory Visit | Attending: Nurse Practitioner | Admitting: Nurse Practitioner

## 2022-03-19 VITALS — BP 202/81 | HR 64 | Temp 98.0°F | Resp 16

## 2022-03-19 DIAGNOSIS — M5441 Lumbago with sciatica, right side: Secondary | ICD-10-CM

## 2022-03-19 DIAGNOSIS — M5442 Lumbago with sciatica, left side: Secondary | ICD-10-CM

## 2022-03-19 MED ORDER — PREDNISONE 20 MG PO TABS: 40.0000 mg | ORAL_TABLET | Freq: Every day | ORAL | 0 refills | Status: AC

## 2022-03-19 MED ORDER — METHYLPREDNISOLONE SODIUM SUCC 125 MG IJ SOLR
80.0000 mg | Freq: Once | INTRAMUSCULAR | Status: AC
  Administered 2022-03-19: 80 mg via INTRAMUSCULAR

## 2022-03-19 NOTE — ED Provider Notes (Addendum)
[facility]    CSN: [id number] Arrival date & time: 03/19/22  1025      History   Chief Complaint Chief Complaint  Patient presents with   Leg Pain    Sciatic pain - Entered by patient   Appointment    1100    HPI Cynthia Cochran is a 72 y.o. female.   The history is provided by the patient.   Patient presents for today for current chronic right-sided sciatica.  Patient states symptoms started approximately 3 days ago.  States pain begins in the right lower back and radiates into the buttock and down the right thigh. She denies numbness, tingling, lower extremity weakness, recent injury, trauma or fall, paresthesia, saddle anesthesia, loss of bowel or bladder function, nausea, vomiting, or urinary symptoms.  Patient reports she has tried ice or heat and stretching for her symptoms with minimal relief.  Patient reports this is a chronic condition for her.  She also reports she has been taking Tylenol for her symptoms.    Past Medical History:  Diagnosis Date   Anxiety    Arthritis    Blood transfusion without reported diagnosis 1982   after c section   Dysphagia 04/25/2016   Gastric ulcer    GERD (gastroesophageal reflux disease)    Heart murmur    High cholesterol 04/25/2016   Hyperlipidemia    Mixed stress and urge urinary incontinence 09/09/2015   Sciatic nerve pain    Spinal stenosis     Patient Active Problem List   Diagnosis Date Noted   Encounter for counseling 01/09/2022   Pelvic relaxation due to cystocele, midline 04/15/2021   Postmenopausal 04/15/2021   Rectal pain 04/05/2021   Anal fissure 12/21/2020   Constipation 08/12/2020   Thoracic spinal stenosis 08/05/2020   Gait disturbance 11/04/2018   Numbness and tingling 11/04/2018   Lumbar radiculopathy 11/04/2018   Cervical stenosis of spinal canal 11/04/2018   PUD (peptic ulcer disease) 05/10/2018   Anemia 05/10/2018   Special screening for malignant neoplasms, colon 05/10/2018   Abdominal  pain 04/07/2018   Hypokalemia 04/07/2018   Osteoarthritis 04/07/2018   Elevated blood pressure reading 04/07/2018   Family hx of colon cancer 02/27/2018   Screening for colorectal cancer 11/06/2017   Encounter for well woman exam with routine gynecological exam 11/06/2017   High cholesterol 04/25/2016   Dysphagia 04/25/2016   Esophageal dysphagia 04/25/2016   Mixed stress and urge urinary incontinence 09/09/2015    Past Surgical History:  Procedure Laterality Date   BIOPSY  05/12/2016   Procedure: BIOPSY;  Surgeon: Malissa Hippo, MD;  [facility];  Service: Endoscopy;;  gastric   BIOPSY  10/04/2018   Procedure: BIOPSY;  Surgeon: Malissa Hippo, MD;  [facility];  Service: Endoscopy;;  gastric    CARPAL TUNNEL RELEASE     right; 1998, left 1998   CESAREAN SECTION  1982   COLONOSCOPY WITH PROPOFOL N/A 10/04/2018   Procedure: COLONOSCOPY WITH PROPOFOL;  Surgeon: Malissa Hippo, MD;  [facility];  Service: Endoscopy;  Laterality: N/A;  7:30   Colonscopy     ESOPHAGEAL DILATION N/A 05/12/2016   Procedure: ESOPHAGEAL DILATION;  Surgeon: Malissa Hippo, MD;  [facility];  Service: Endoscopy;  Laterality: N/A;   ESOPHAGOGASTRODUODENOSCOPY N/A 05/12/2016   Procedure: ESOPHAGOGASTRODUODENOSCOPY (EGD);  Surgeon: Malissa Hippo, MD;  [facility];  Service: Endoscopy;  Laterality: N/A;  9:30   ESOPHAGOGASTRODUODENOSCOPY (EGD)  WITH PROPOFOL N/A 10/04/2018   Procedure: ESOPHAGOGASTRODUODENOSCOPY (EGD) WITH PROPOFOL;  Surgeon: Malissa Hippoehman, Najeeb U, MD;  [facility];  Service: Endoscopy;  Laterality: N/A;   EYE SURGERY     lasix   FLEXIBLE SIGMOIDOSCOPY N/A 01/28/2021   Procedure: FLEXIBLE SIGMOIDOSCOPY WITH PROPOFOL;  Surgeon: Malissa Hippoehman, Najeeb U, MD;  [facility];  Service: Endoscopy;  Laterality: N/A;  9:30   JOINT REPLACEMENT     both hips   LUMBAR LAMINECTOMY/DECOMPRESSION MICRODISCECTOMY N/A 08/05/2020   Procedure:  LAMINECTOMY THORACIC TEN-ELEVEN;  Surgeon: Coletta Memosabbell, Kyle, MD;  Location: [facility];  Service: Neurosurgery;  Laterality: N/A;   LUMBAR LAMINECTOMY/DECOMPRESSION MICRODISCECTOMY N/A 10/14/2020   Procedure: Right Lumbar 4-5, Lumbar 5 Sacral 1 Lumbar and thoracic laminectomy;  Surgeon: Coletta Memosabbell, Kyle, MD;  Location: [facility];  Service: Neurosurgery;  Laterality: N/A;  Right Lumbar 4-5, Lumbar 5 Sacral 1 Lumbar and thoracic laminectomy   TOTAL HIP ARTHROPLASTY     right; March 2013; left March 2011    OB History     Gravida  3   Para  1   Term      Preterm  1   AB  2   Living  0      SAB  2   IAB      Ectopic      Multiple      Live Births               Home Medications    Prior to Admission medications   Medication Sig Start Date End Date Taking? Authorizing Provider  predniSONE (DELTASONE) 20 MG tablet Take 2 tablets (40 mg total) by mouth daily with breakfast for 5 days. 03/19/22 03/24/22 Yes Chiara Coltrin-Warren, Sadie Haberhristie J, NP  acetaminophen (TYLENOL) 650 MG CR tablet Take 1,300 mg by mouth every 8 (eight) hours as needed for pain.    [provider]  atorvastatin (LIPITOR) 80 MG tablet Take 80 mg by mouth at bedtime.  12/14/11   [provider]  Cholecalciferol (DIALYVITE VITAMIN D 5000) 125 MCG (5000 UT) capsule Take 5,000 Units by mouth daily.    [provider]  COLLAGEN PO Take 1 capsule by mouth daily.    [provider]  HYDROcodone-acetaminophen (NORCO/VICODIN) 5-325 MG tablet Take 1-2 tablets by mouth every 6 (six) hours as needed. 01/12/22   Benjiman CorePickering, Nathan, MD  losartan (COZAAR) 50 MG tablet Take 1 tablet (50 mg total) by mouth daily. 01/03/22   Wendall StadeNishan, Peter C, MD  polyethylene glycol (MIRALAX / GLYCOLAX) 17 g packet Take 17 g by mouth daily. One capful daily in coffee    [provider]  Psyllium (METAMUCIL PO) Take 1 Dose by mouth daily. 1 dose = 2 teaspoons    [provider]  Specialty Vitamins Products (BIOTIN PLUS  KERATIN PO) Take 1 capsule by mouth daily.    [provider]  tiZANidine (ZANAFLEX) 4 MG tablet Take 1 tablet (4 mg total) by mouth every 6 (six) hours as needed for muscle spasms. 01/12/22   Benjiman CorePickering, Nathan, MD  Turmeric Curcumin 500 MG CAPS Take 500 mg by mouth daily.    [provider]  Vibegron 75 MG TABS Take 1 tablet by mouth daily. 02/22/22   Marguerita BeardsSchroeder, Michelle N, MD    Family History Family History  Problem Relation Age of Onset   Other Mother        heavy smoker; died from arterial sclerosis   Colon cancer Father 2364  Hypertension Sister    Colon cancer Brother 78   Stomach cancer Neg Hx    Breast cancer Neg Hx     Social History Social History   Tobacco Use   Smoking status: Never   Smokeless tobacco: Never  Vaping Use   Vaping Use: Never used  Substance Use Topics   Alcohol use: Yes    Comment: social drinking - a glass of wine about every 2-3 weeka   Drug use: No     Allergies   Lyrica [pregabalin], Mobic [meloxicam], and Nsaids   Review of Systems Review of Systems Per HPI  Physical Exam Triage Vital Signs ED Triage Vitals  Enc Vitals Group     BP 03/19/22 1034 (!) 202/81     Pulse Rate 03/19/22 1034 64     Resp 03/19/22 1034 16     Temp 03/19/22 1034 98 F (36.7 C)     Temp Source 03/19/22 1034 Oral     SpO2 03/19/22 1034 98 %     Weight --      Height --      Head Circumference --      Peak Flow --      Pain Score 03/19/22 1036 10     Pain Loc --      Pain Edu? --      Excl. in [place]? --    No data found.  Updated Vital Signs BP (!) 202/81 (BP Location: Right Arm) Comment: Reports her Cardiologist said her blood pressure increse due to stress.  Pulse 64   Temp 98 F (36.7 C) (Oral)   Resp 16   SpO2 98%   Visual Acuity Right Eye Distance:   Left Eye Distance:   Bilateral Distance:    Right Eye Near:   Left Eye Near:    Bilateral Near:     Physical Exam Vitals and nursing note reviewed.  Constitutional:       General: She is not in acute distress.    Appearance: Normal appearance.  HENT:     Head: Normocephalic.  Eyes:     Extraocular Movements: Extraocular movements intact.     Pupils: Pupils are equal, round, and reactive to light.  Cardiovascular:     Rate and Rhythm: Normal rate and regular rhythm.     Pulses: Normal pulses.     Heart sounds: Normal heart sounds.  Pulmonary:     Effort: Pulmonary effort is normal. No respiratory distress.     Breath sounds: Normal breath sounds. No stridor. No wheezing, rhonchi or rales.  Abdominal:     General: Bowel sounds are normal.     Palpations: Abdomen is soft.     Tenderness: There is no abdominal tenderness.  Musculoskeletal:     Cervical back: Normal range of motion.     Lumbar back: No swelling, deformity, lacerations or spasms.     Comments: Tenderness noted to the right sciatic nerve in the right buttock.  Patient with full active and passive range of motion.  Lymphadenopathy:     Cervical: No cervical adenopathy.  Skin:    General: Skin is warm and dry.  Neurological:     General: No focal deficit present.     Mental Status: She is alert and oriented to person, place, and time.  Psychiatric:        Mood and Affect: Mood normal.        Behavior: Behavior normal.      UC Treatments / Results  Labs (all labs ordered are listed, but only abnormal results are displayed) Labs Reviewed - No data to display  EKG   Radiology No results found.  Procedures Procedures (including critical care time)  Medications Ordered in UC Medications  methylPREDNISolone sodium succinate (SOLU-MEDROL) 125 mg/2 mL injection 80 mg (has no administration in time range)    Initial Impression / Assessment and Plan / UC Course  I have reviewed the triage vital signs and the nursing notes.  Pertinent labs & imaging results that were available during my care of the patient were reviewed by me and considered in my medical decision making (see  chart for details).  The patient is well-appearing, she is in no acute distress, vital signs are stable.  Symptoms consistent with recurrent right-sided sciatica.  Solu-Medrol 80 mg IM injection was given today.  Will hold on Toradol today as patient does not have any recent lab work indicating her most recent creatinine function.  Discussed same with the patient.  Will start patient on prednisone 40 mg for 5 days.  Patient advised to take over-the-counter Tylenol as needed for pain or discomfort.  Supportive care recommendations were provided to the patient to include use of ice or heat, and stretching.  Patient reports that she does have previous information provided for sciatica rehab.  Patient was given strict indications for ER follow-up.  Patient verbalizes understanding.  All questions were answered.  Patient stable for discharge.  Final Clinical Impressions(s) / UC Diagnoses   Final diagnoses:  Right-sided low back pain with bilateral sciatica, unspecified chronicity     Discharge Instructions      Take medication as prescribed. Continue stretching exercises for your low back pain. May apply ice or heat as needed. Ice for pain or swelling, heat for spasm or stiffness. Apply for 20 minutes, remove for 1 hour, then repeat as needed. Go to the emergency department immediately if you develop loss of bowel or bladder function, inability to ambulate, or worsening numbness or tingling that is different from baseline. Follow-up with your primary care physician if symptoms do not improve.      ED Prescriptions     Medication Sig Dispense Auth. Provider   predniSONE (DELTASONE) 20 MG tablet Take 2 tablets (40 mg total) by mouth daily with breakfast for 5 days. 10 tablet Hoorain Kozakiewicz-Warren, Alda Lea, NP      PDMP not reviewed this encounter.   Tish Men, NP 03/19/22 1118    Atari Novick-Warren, Alda Lea, NP 03/19/22 1209

## 2022-03-19 NOTE — ED Triage Notes (Signed)
Pt reports right side buttock pain and radiated to right leg pain x 3 day. Tylenol gives no relief.

## 2022-03-19 NOTE — Discharge Instructions (Addendum)
Take medication as prescribed. Continue stretching exercises for your low back pain. May apply ice or heat as needed. Ice for pain or swelling, heat for spasm or stiffness. Apply for 20 minutes, remove for 1 hour, then repeat as needed. Go to the emergency department immediately if you develop loss of bowel or bladder function, inability to ambulate, or worsening numbness or tingling that is different from baseline. Follow-up with your primary care physician if symptoms do not improve.

## 2022-03-20 ENCOUNTER — Other Ambulatory Visit: Payer: Self-pay | Admitting: Surgery

## 2022-03-20 DIAGNOSIS — K594 Anal spasm: Secondary | ICD-10-CM

## 2022-03-20 DIAGNOSIS — M6289 Other specified disorders of muscle: Secondary | ICD-10-CM

## 2022-03-21 ENCOUNTER — Other Ambulatory Visit: Payer: Self-pay | Admitting: Surgery

## 2022-03-21 ENCOUNTER — Encounter: Payer: Medicare Other | Admitting: Physical Therapy

## 2022-03-21 DIAGNOSIS — K594 Anal spasm: Secondary | ICD-10-CM

## 2022-03-21 DIAGNOSIS — M6289 Other specified disorders of muscle: Secondary | ICD-10-CM

## 2022-04-01 ENCOUNTER — Ambulatory Visit
Admission: RE | Admit: 2022-04-01 | Discharge: 2022-04-01 | Disposition: A | Discharge status: Home / Self Care | Payer: Medicare Other | Source: Ambulatory Visit | Attending: Surgery | Admitting: Surgery

## 2022-04-01 DIAGNOSIS — M6289 Other specified disorders of muscle: Secondary | ICD-10-CM

## 2022-04-01 DIAGNOSIS — K594 Anal spasm: Secondary | ICD-10-CM

## 2022-04-05 ENCOUNTER — Ambulatory Visit: Payer: Medicare Other | Admitting: Obstetrics and Gynecology

## 2022-04-05 ENCOUNTER — Other Ambulatory Visit: Payer: Medicare Other

## 2022-04-13 ENCOUNTER — Ambulatory Visit: Payer: Medicare Other

## 2022-04-19 NOTE — Therapy (Unsigned)
OUTPATIENT PHYSICAL THERAPY FEMALE PELVIC EVALUATION   Patient Name: Cynthia Cochran MRN: [id number] DOB:August 22, 1950, 72 y.o., female Today's Date: 04/20/2022  END OF SESSION:  PT End of Session - 04/20/22 1356     Visit Number 1    Date for PT Re-Evaluation 07/13/22    Authorization Type Medicare    Authorization - Visit Number 1    Authorization - Number of Visits 10    PT Start Time 1350    PT Stop Time 1445    PT Time Calculation (min) 55 min    Activity Tolerance Patient tolerated treatment well    Behavior During Therapy [facility] for tasks assessed/performed             Past Medical History:  Diagnosis Date   Anxiety    Arthritis    Blood transfusion without reported diagnosis 1982   after c section   Dysphagia 04/25/2016   Gastric ulcer    GERD (gastroesophageal reflux disease)    Heart murmur    High cholesterol 04/25/2016   Hyperlipidemia    Mixed stress and urge urinary incontinence 09/09/2015   Sciatic nerve pain    Spinal stenosis    Past Surgical History:  Procedure Laterality Date   BIOPSY  05/12/2016   Procedure: BIOPSY;  Surgeon: Rogene Houston, MD;  [facility];  Service: Endoscopy;;  gastric   BIOPSY  10/04/2018   Procedure: BIOPSY;  Surgeon: Rogene Houston, MD;  [facility];  Service: Endoscopy;;  gastric    CARPAL TUNNEL RELEASE     right; 1998, left [place]   COLONOSCOPY WITH PROPOFOL N/A 10/04/2018   Procedure: COLONOSCOPY WITH PROPOFOL;  Surgeon: Rogene Houston, MD;  [facility];  Service: Endoscopy;  Laterality: N/A;  7:30   Colonscopy     ESOPHAGEAL DILATION N/A 05/12/2016   Procedure: ESOPHAGEAL DILATION;  Surgeon: Rogene Houston, MD;  [facility];  Service: Endoscopy;  Laterality: N/A;   ESOPHAGOGASTRODUODENOSCOPY N/A 05/12/2016   Procedure: ESOPHAGOGASTRODUODENOSCOPY (EGD);  Surgeon: Rogene Houston, MD;  [facility];  Service: Endoscopy;  Laterality: N/A;  9:30    ESOPHAGOGASTRODUODENOSCOPY (EGD) WITH PROPOFOL N/A 10/04/2018   Procedure: ESOPHAGOGASTRODUODENOSCOPY (EGD) WITH PROPOFOL;  Surgeon: Rogene Houston, MD;  [facility];  Service: Endoscopy;  Laterality: N/A;   EYE SURGERY     lasix   FLEXIBLE SIGMOIDOSCOPY N/A 01/28/2021   Procedure: FLEXIBLE SIGMOIDOSCOPY WITH PROPOFOL;  Surgeon: Rogene Houston, MD;  [facility];  Service: Endoscopy;  Laterality: N/A;  9:30   JOINT REPLACEMENT     both hips   LUMBAR LAMINECTOMY/DECOMPRESSION MICRODISCECTOMY N/A 08/05/2020   Procedure: LAMINECTOMY THORACIC TEN-ELEVEN;  Surgeon: Ashok Pall, MD;  Location: [place];  Service: Neurosurgery;  Laterality: N/A;   LUMBAR LAMINECTOMY/DECOMPRESSION MICRODISCECTOMY N/A 10/14/2020   Procedure: Right Lumbar 4-5, Lumbar 5 Sacral 1 Lumbar and thoracic laminectomy;  Surgeon: Ashok Pall, MD;  Location: [place];  Service: Neurosurgery;  Laterality: N/A;  Right Lumbar 4-5, Lumbar 5 Sacral 1 Lumbar and thoracic laminectomy   TOTAL HIP ARTHROPLASTY     right; March 2013; left March 2011   Patient Active Problem List   Diagnosis Date Noted   Encounter for counseling 01/09/2022   Pelvic relaxation due to cystocele, midline 04/15/2021   Postmenopausal 04/15/2021   Rectal pain 04/05/2021   Anal fissure 12/21/2020   Constipation 08/12/2020   Thoracic spinal stenosis  08/05/2020   Gait disturbance 11/04/2018   Numbness and tingling 11/04/2018   Lumbar radiculopathy 11/04/2018   Cervical stenosis of spinal canal 11/04/2018   PUD (peptic ulcer disease) 05/10/2018   Anemia 05/10/2018   Special screening for malignant neoplasms, colon 05/10/2018   Abdominal pain 04/07/2018   Hypokalemia 04/07/2018   Osteoarthritis 04/07/2018   Elevated blood pressure reading 04/07/2018   Family hx of colon cancer 02/27/2018   Screening for colorectal cancer 11/06/2017   Encounter for well woman exam with routine gynecological exam 11/06/2017   High cholesterol  04/25/2016   Dysphagia 04/25/2016   Esophageal dysphagia 04/25/2016   Mixed stress and urge urinary incontinence 09/09/2015    PCP: Celene Squibb, MD  REFERRING PROVIDER: Jaquita Folds, MD   REFERRING DIAG: [phone] (ICD-10-CM) - Levator spasm   THERAPY DIAG:  Cramp and spasm  Other lack of coordination  Rationale for Evaluation and Treatment: Rehabilitation  ONSET DATE: 2021  SUBJECTIVE:                                                                                                                                                                                           SUBJECTIVE STATEMENT: Patient feels like she is sitting on a tennis ball.    PAIN:  Are you having pain? Yes NPRS scale: 10/10  Pain location: moves from the rectum to the tailbone  Pain type: discomfort Pain description: intermittent   Aggravating factors: sitting, standing Relieving factors: laying on side with pillow between knees  PRECAUTIONS: None  WEIGHT BEARING RESTRICTIONS: No  FALLS:  Has patient fallen in last 6 months? Yes. Number of falls 01/11/22 in garage  LIVING ENVIRONMENT: Lives with: lives alone   OCCUPATION: retired  PLOF: Independent  PATIENT GOALS: reduce discomfort  PERTINENT HISTORY:  C-section; bil. Hips replaced; lumbar laminectomy   BOWEL MOVEMENT: Pain with bowel movement: No Type of bowel movement:Type (Bristol Stool Scale) Type 6, Frequency daily, and Strain No Fully empty rectum: No, sometimes has to go a second time Leakage: No   URINATION: Pain with urination: No Fully empty bladder: Yes:   Stream: Strong Urgency: Yes: sometimes Frequency: every 2-3 hours Leakage: Sneezing and Laughing Pads: Yes:    INTERCOURSE:not sexually active   PREGNANCY: C-section deliveries C-section deliveries 1 still born    OBJECTIVE:   COGNITION: Overall cognitive status: Within functional limits for tasks assessed      POSTURE: posterior pelvic  tilt  PELVIC ALIGNMENT:  LUMBARAROM/PROM:  A/PROM A/PROM  eval  Flexion   Extension Decreased by 50% with pain in right SI area  Left lateral flexion Decreased by 25%  with pain across the sacrum   (Blank rows = not tested)  LOWER EXTREMITY PIR:JJOACZYSA hip ROM is full   LOWER EXTREMITY MMT:  MMT Right eval Left eval  Hip extension 4/5 4/5  Hip abduction 4/5 4/5   PALPATION:   General  tenderness located in the left gluteal and piriformis                External Perineal Exam decreased sensation on the left S3, S4, and S5 dermatome                             Internal Pelvic Floor tenderness located on the left alcock canal, along the puborectalis, and iliococcygeus, difficulty pushing the therapist finger all the way out of the rectum  Patient confirms identification and approves PT to assess internal pelvic floor and treatment Yes  PELVIC MMT:   MMT eval  Internal Anal Sphincter 2/5 for 2 sec  External Anal Sphincter 2/5  Puborectalis 2/5  (Blank rows = not tested)        TONE: average   TODAY'S TREATMENT:                                                                                                                              DATE: 04/20/22  EVAL finished the eval and explained findings and future treatment    PATIENT EDUCATION:  Education details: using her pelvic floor wand Person educated: Patient Education method: Explanation Education comprehension: verbalized understanding  HOME EXERCISE PROGRAM: See above.   ASSESSMENT:  CLINICAL IMPRESSION: Patient is a 72 y.o. female who was seen today for physical therapy evaluation and treatment for levator ani spasm. Patient reports her pressure pain can get to a 10/10 when she is sitting or standing. She sits on the right buttocks due to the pressure. She has reduce feeling in the left dermatome S3, S4, and S5. She has tenderness located on the left piriformis, gluteal, iliococcygeus and puborectalis.  Pelvic floor strength is 2/5 holding for 2 sec. She has decreased movement of the sacrum. Left lumbar sidebending is limited and cause pain along the sacrum. Lumbar extension causes pain in the right SI joint. Patient will benefit from skilled therapy to reduce her pressure so she is able to stand and sit with increased comfort.   OBJECTIVE IMPAIRMENTS: decreased activity tolerance, decreased endurance, decreased strength, increased fascial restrictions, and pain.   ACTIVITY LIMITATIONS: sitting and standing  PARTICIPATION LIMITATIONS: meal prep, cleaning, shopping, and community activity  PERSONAL FACTORS: C-section; bil. Hips replaced; lumbar laminectomy  are also affecting patient's functional outcome.   REHAB POTENTIAL: Good  CLINICAL DECISION MAKING: Evolving/moderate complexity  EVALUATION COMPLEXITY: Moderate   GOALS: Goals reviewed with patient? Yes  SHORT TERM GOALS: Target date: 05/18/22  Patient is independent with initial HEP for stretches, nerve stretch, and diaphragmatic breathing Baseline: Goal status: INITIAL  2.  Patient reports the  pressure feeling decreased </= 25%.  Baseline:  Goal status: INITIAL   LONG TERM GOALS: Target date: 07/13/2022  Patient independent with initial HEP for pelvic floor strength and elongation.  Baseline:  Goal status: INITIAL  2.  Patient able to sit for >/= 45 minutes with pressure feeling decreased </= 70%.  Baseline:  Goal status: INITIAL  3.  Patient able to stand for 30 minutes at the store with pressure feeling decreased </= 70%.  Baseline:  Goal status: INITIAL  4.  Patient is able to contract the pelvic floor for 15 seconds with strength of 3/5.  Baseline:  Goal status: INITIAL  5.  Patient is able to push the therapist finger out of the rectum when bearing down.  Baseline:  Goal status: INITIAL    PLAN:  PT FREQUENCY: 1x/week  PT DURATION: 12 weeks  PLANNED INTERVENTIONS: Therapeutic exercises, Therapeutic  activity, Neuromuscular re-education, Patient/Family education, Joint mobilization, Dry Needling, Electrical stimulation, Cryotherapy, Moist heat, Biofeedback, and Manual therapy  PLAN FOR NEXT SESSION: dry needle to the piriformis, mobilization to sacrum, manual work to the piriformis, diaphragmatic breathing   Eulis Foster, PT 04/20/22 4:12 PM

## 2022-04-20 ENCOUNTER — Other Ambulatory Visit: Payer: Self-pay

## 2022-04-20 ENCOUNTER — Encounter: Payer: Medicare Other | Attending: Obstetrics and Gynecology | Admitting: Physical Therapy

## 2022-04-20 ENCOUNTER — Encounter: Payer: Self-pay | Admitting: Physical Therapy

## 2022-04-20 DIAGNOSIS — R252 Cramp and spasm: Secondary | ICD-10-CM | POA: Diagnosis present

## 2022-04-20 DIAGNOSIS — R278 Other lack of coordination: Secondary | ICD-10-CM | POA: Diagnosis present

## 2022-04-23 NOTE — Progress Notes (Deleted)
GI Office Note    Referring Provider: Celene Squibb, MD Primary Care Physician:  Celene Squibb, MD Primary Gastroenterologist: ***  Date:  04/23/2022  ID:  Cynthia Cochran, DOB January 05, 1951, MRN [id number]   Chief Complaint   No chief complaint on file.    History of Present Illness  Cynthia Cochran is a 72 y.o. female with a history of *** presenting today with complaint of     Current Outpatient Medications  Medication Sig Dispense Refill   acetaminophen (TYLENOL) 650 MG CR tablet Take 1,300 mg by mouth every 8 (eight) hours as needed for pain.     atorvastatin (LIPITOR) 80 MG tablet Take 80 mg by mouth at bedtime.      Cholecalciferol (DIALYVITE VITAMIN D 5000) 125 MCG (5000 UT) capsule Take 5,000 Units by mouth daily.     COLLAGEN PO Take 1 capsule by mouth daily.     HYDROcodone-acetaminophen (NORCO/VICODIN) 5-325 MG tablet Take 1-2 tablets by mouth every 6 (six) hours as needed. 6 tablet 0   losartan (COZAAR) 50 MG tablet Take 1 tablet (50 mg total) by mouth daily. 90 tablet 3   polyethylene glycol (MIRALAX / GLYCOLAX) 17 g packet Take 17 g by mouth daily. One capful daily in coffee     Psyllium (METAMUCIL PO) Take 1 Dose by mouth daily. 1 dose = 2 teaspoons     Specialty Vitamins Products (BIOTIN PLUS KERATIN PO) Take 1 capsule by mouth daily.     tiZANidine (ZANAFLEX) 4 MG tablet Take 1 tablet (4 mg total) by mouth every 6 (six) hours as needed for muscle spasms. 20 tablet 0   Turmeric Curcumin 500 MG CAPS Take 500 mg by mouth daily.     Vibegron 75 MG TABS Take 1 tablet by mouth daily. 30 tablet 5   No current facility-administered medications for this visit.    Past Medical History:  Diagnosis Date   Anxiety    Arthritis    Blood transfusion without reported diagnosis 1982   after c section   Dysphagia 04/25/2016   Gastric ulcer    GERD (gastroesophageal reflux disease)    Heart murmur    High cholesterol 04/25/2016   Hyperlipidemia    Mixed stress and urge  urinary incontinence 09/09/2015   Sciatic nerve pain    Spinal stenosis     Past Surgical History:  Procedure Laterality Date   BIOPSY  05/12/2016   Procedure: BIOPSY;  Surgeon: Rogene Houston, MD;  [facility];  Service: Endoscopy;;  gastric   BIOPSY  10/04/2018   Procedure: BIOPSY;  Surgeon: Rogene Houston, MD;  [facility];  Service: Endoscopy;;  gastric    CARPAL TUNNEL RELEASE     right; 1998, left [place]   COLONOSCOPY WITH PROPOFOL N/A 10/04/2018   Procedure: COLONOSCOPY WITH PROPOFOL;  Surgeon: Rogene Houston, MD;  [facility];  Service: Endoscopy;  Laterality: N/A;  7:30   Colonscopy     ESOPHAGEAL DILATION N/A 05/12/2016   Procedure: ESOPHAGEAL DILATION;  Surgeon: Rogene Houston, MD;  [facility];  Service: Endoscopy;  Laterality: N/A;   ESOPHAGOGASTRODUODENOSCOPY N/A 05/12/2016   Procedure: ESOPHAGOGASTRODUODENOSCOPY (EGD);  Surgeon: Rogene Houston, MD;  [facility];  Service: Endoscopy;  Laterality: N/A;  9:30   ESOPHAGOGASTRODUODENOSCOPY (EGD) WITH PROPOFOL N/A 10/04/2018   Procedure: ESOPHAGOGASTRODUODENOSCOPY (EGD) WITH PROPOFOL;  Surgeon: Rogene Houston, MD;  [facility];  Service: Endoscopy;  Laterality: N/A;   EYE SURGERY     lasix   FLEXIBLE SIGMOIDOSCOPY N/A 01/28/2021   Procedure: FLEXIBLE SIGMOIDOSCOPY WITH PROPOFOL;  Surgeon: Rogene Houston, MD;  [facility];  Service: Endoscopy;  Laterality: N/A;  9:30   JOINT REPLACEMENT     both hips   LUMBAR LAMINECTOMY/DECOMPRESSION MICRODISCECTOMY N/A 08/05/2020   Procedure: LAMINECTOMY THORACIC TEN-ELEVEN;  Surgeon: Ashok Pall, MD;  Location: [place];  Service: Neurosurgery;  Laterality: N/A;   LUMBAR LAMINECTOMY/DECOMPRESSION MICRODISCECTOMY N/A 10/14/2020   Procedure: Right Lumbar 4-5, Lumbar 5 Sacral 1 Lumbar and thoracic laminectomy;  Surgeon: Ashok Pall, MD;  Location: [place];  Service: Neurosurgery;   Laterality: N/A;  Right Lumbar 4-5, Lumbar 5 Sacral 1 Lumbar and thoracic laminectomy   TOTAL HIP ARTHROPLASTY     right; March 2013; left March 2011    Family History  Problem Relation Age of Onset   Other Mother        heavy smoker; died from arterial sclerosis   Colon cancer Father 79   Hypertension Sister    Colon cancer Brother 4   Stomach cancer Neg Hx    Breast cancer Neg Hx     Allergies as of 04/24/2022 - Review Complete 04/20/2022  Allergen Reaction Noted   Lyrica [pregabalin] Other (See Comments) 11/04/2018   Mobic [meloxicam] Other (See Comments) 09/25/2018   Nsaids Other (See Comments) 05/24/2018    Social History   Socioeconomic History   Marital status: Widowed    Spouse name: Not on file   Number of children: 0   Years of education: some college   Highest education level: Not on file  Occupational History   Occupation: Retired  Tobacco Use   Smoking status: Never   Smokeless tobacco: Never  Vaping Use   Vaping Use: Never used  Substance and Sexual Activity   Alcohol use: Yes    Comment: social drinking - a glass of wine about every 2-3 weeka   Drug use: No   Sexual activity: Not Currently    Birth control/protection: Post-menopausal, Abstinence  Other Topics Concern   Not on file  Social History Narrative   Lives alone   Caffeine use: no more than 2 cups per day    16 oz soda per day   Right handed    Social Determinants of Health   Financial Resource Strain: Low Risk  (04/15/2021)   Overall Financial Resource Strain (CARDIA)    Difficulty of Paying Living Expenses: Not hard at all  Food Insecurity: No Food Insecurity (04/15/2021)   Hunger Vital Sign    Worried About Running Out of Food in the Last Year: Never true    [place] in the Last Year: Never true  Transportation Needs: No Transportation Needs (04/15/2021)   PRAPARE - Hydrologist (Medical): No    Lack of Transportation (Non-Medical): No  Physical  Activity: Insufficiently Active (04/15/2021)   Exercise Vital Sign    Days of Exercise per Week: 3 days    Minutes of Exercise per Session: 20 min  Stress: No Stress Concern Present (04/15/2021)   [place]    Feeling of Stress : Not at all  Social Connections: Moderately Integrated (04/15/2021)   Social Connection and Isolation Panel [NHANES]    Frequency of Communication with Friends and Family: More than three times a week  Frequency of Social Gatherings with Friends and Family: Once a week    Attends Religious Services: More than 4 times per year    Active Member of [employer] or Organizations: Yes    Attends Archivist Meetings: More than 4 times per year    Marital Status: Widowed     Review of Systems   Gen: Denies fever, chills, anorexia. Denies fatigue, weakness, weight loss.  CV: Denies chest pain, palpitations, syncope, peripheral edema, and claudication. Resp: Denies dyspnea at rest, cough, wheezing, coughing up blood, and pleurisy. GI: See HPI Derm: Denies rash, itching, dry skin Psych: Denies depression, anxiety, memory loss, confusion. No homicidal or suicidal ideation.  Heme: Denies bruising, bleeding, and enlarged lymph nodes.   Physical Exam   There were no vitals taken for this visit.  General:   Alert and oriented. No distress noted. Pleasant and cooperative.  Head:  Normocephalic and atraumatic. Eyes:  Conjuctiva clear without scleral icterus. Mouth:  Oral mucosa pink and moist. Good dentition. No lesions. Lungs:  Clear to auscultation bilaterally. No wheezes, rales, or rhonchi. No distress.  Heart:  S1, S2 present without murmurs appreciated.  Abdomen:  +BS, soft, non-tender and non-distended. No rebound or guarding. No HSM or masses noted. Rectal: *** Msk:  Symmetrical without gross deformities. Normal posture. Extremities:  Without edema. Neurologic:  Alert and  oriented x4 Psych:   Alert and cooperative. Normal mood and affect.   Assessment  Cynthia Cochran is a 72 y.o. female with a history of *** presenting today with    PLAN   ***     Venetia Night, MSN, FNP-BC, AGACNP-BC [facility]  GI Office Note    Referring Provider: Celene Squibb, MD Primary Care Physician:  Celene Squibb, MD Primary Gastroenterologist: ***  Date:  04/23/2022  ID:  Cynthia Cochran, DOB 1951/01/27, MRN [id number]   Chief Complaint   No chief complaint on file.    History of Present Illness  JALISSA WENDT is a 72 y.o. female with a history of *** presenting today with complaint of ***    Current Outpatient Medications  Medication Sig Dispense Refill   acetaminophen (TYLENOL) 650 MG CR tablet Take 1,300 mg by mouth every 8 (eight) hours as needed for pain.     atorvastatin (LIPITOR) 80 MG tablet Take 80 mg by mouth at bedtime.      Cholecalciferol (DIALYVITE VITAMIN D 5000) 125 MCG (5000 UT) capsule Take 5,000 Units by mouth daily.     COLLAGEN PO Take 1 capsule by mouth daily.     HYDROcodone-acetaminophen (NORCO/VICODIN) 5-325 MG tablet Take 1-2 tablets by mouth every 6 (six) hours as needed. 6 tablet 0   losartan (COZAAR) 50 MG tablet Take 1 tablet (50 mg total) by mouth daily. 90 tablet 3   polyethylene glycol (MIRALAX / GLYCOLAX) 17 g packet Take 17 g by mouth daily. One capful daily in coffee     Psyllium (METAMUCIL PO) Take 1 Dose by mouth daily. 1 dose = 2 teaspoons     Specialty Vitamins Products (BIOTIN PLUS KERATIN PO) Take 1 capsule by mouth daily.     tiZANidine (ZANAFLEX) 4 MG tablet Take 1 tablet (4 mg total) by mouth every 6 (six) hours as needed for muscle spasms. 20 tablet 0   Turmeric Curcumin 500 MG CAPS Take 500 mg by mouth daily.     Vibegron 75 MG TABS Take 1 tablet by mouth daily. 30 tablet 5  No current facility-administered medications for this visit.    Past Medical History:  Diagnosis Date   Anxiety    Arthritis     Blood transfusion without reported diagnosis 1982   after c section   Dysphagia 04/25/2016   Gastric ulcer    GERD (gastroesophageal reflux disease)    Heart murmur    High cholesterol 04/25/2016   Hyperlipidemia    Mixed stress and urge urinary incontinence 09/09/2015   Sciatic nerve pain    Spinal stenosis     Past Surgical History:  Procedure Laterality Date   BIOPSY  05/12/2016   Procedure: BIOPSY;  Surgeon: Rogene Houston, MD;  [facility];  Service: Endoscopy;;  gastric   BIOPSY  10/04/2018   Procedure: BIOPSY;  Surgeon: Rogene Houston, MD;  [facility];  Service: Endoscopy;;  gastric    CARPAL TUNNEL RELEASE     right; 1998, left [place]   COLONOSCOPY WITH PROPOFOL N/A 10/04/2018   Procedure: COLONOSCOPY WITH PROPOFOL;  Surgeon: Rogene Houston, MD;  [facility];  Service: Endoscopy;  Laterality: N/A;  7:30   Colonscopy     ESOPHAGEAL DILATION N/A 05/12/2016   Procedure: ESOPHAGEAL DILATION;  Surgeon: Rogene Houston, MD;  [facility];  Service: Endoscopy;  Laterality: N/A;   ESOPHAGOGASTRODUODENOSCOPY N/A 05/12/2016   Procedure: ESOPHAGOGASTRODUODENOSCOPY (EGD);  Surgeon: Rogene Houston, MD;  [facility];  Service: Endoscopy;  Laterality: N/A;  9:30   ESOPHAGOGASTRODUODENOSCOPY (EGD) WITH PROPOFOL N/A 10/04/2018   Procedure: ESOPHAGOGASTRODUODENOSCOPY (EGD) WITH PROPOFOL;  Surgeon: Rogene Houston, MD;  [facility];  Service: Endoscopy;  Laterality: N/A;   EYE SURGERY     lasix   FLEXIBLE SIGMOIDOSCOPY N/A 01/28/2021   Procedure: FLEXIBLE SIGMOIDOSCOPY WITH PROPOFOL;  Surgeon: Rogene Houston, MD;  [facility];  Service: Endoscopy;  Laterality: N/A;  9:30   JOINT REPLACEMENT     both hips   LUMBAR LAMINECTOMY/DECOMPRESSION MICRODISCECTOMY N/A 08/05/2020   Procedure: LAMINECTOMY THORACIC TEN-ELEVEN;  Surgeon: Ashok Pall, MD;  Location: [place];  Service: Neurosurgery;   Laterality: N/A;   LUMBAR LAMINECTOMY/DECOMPRESSION MICRODISCECTOMY N/A 10/14/2020   Procedure: Right Lumbar 4-5, Lumbar 5 Sacral 1 Lumbar and thoracic laminectomy;  Surgeon: Ashok Pall, MD;  Location: [place];  Service: Neurosurgery;  Laterality: N/A;  Right Lumbar 4-5, Lumbar 5 Sacral 1 Lumbar and thoracic laminectomy   TOTAL HIP ARTHROPLASTY     right; March 2013; left March 2011    Family History  Problem Relation Age of Onset   Other Mother        heavy smoker; died from arterial sclerosis   Colon cancer Father 55   Hypertension Sister    Colon cancer Brother 60   Stomach cancer Neg Hx    Breast cancer Neg Hx     Allergies as of 04/24/2022 - Review Complete 04/20/2022  Allergen Reaction Noted   Lyrica [pregabalin] Other (See Comments) 11/04/2018   Mobic [meloxicam] Other (See Comments) 09/25/2018   Nsaids Other (See Comments) 05/24/2018    Social History   Socioeconomic History   Marital status: Widowed    Spouse name: Not on file   Number of children: 0   Years of education: some college   Highest education level: Not on file  Occupational History   Occupation: Retired  Tobacco Use   Smoking status: Never   Smokeless tobacco: Never  Media planner  Vaping Use: Never used  Substance and Sexual Activity   Alcohol use: Yes    Comment: social drinking - a glass of wine about every 2-3 weeka   Drug use: No   Sexual activity: Not Currently    Birth control/protection: Post-menopausal, Abstinence  Other Topics Concern   Not on file  Social History Narrative   Lives alone   Caffeine use: no more than 2 cups per day    16 oz soda per day   Right handed    Social Determinants of Health   Financial Resource Strain: Low Risk  (04/15/2021)   Overall Financial Resource Strain (CARDIA)    Difficulty of Paying Living Expenses: Not hard at all  Food Insecurity: No Food Insecurity (04/15/2021)   Hunger Vital Sign    Worried About Running Out of Food in the Last Year: Never  true    Ran Out of Food in the Last Year: Never true  Transportation Needs: No Transportation Needs (04/15/2021)   PRAPARE - Hydrologist (Medical): No    Lack of Transportation (Non-Medical): No  Physical Activity: Insufficiently Active (04/15/2021)   Exercise Vital Sign    Days of Exercise per Week: 3 days    Minutes of Exercise per Session: 20 min  Stress: No Stress Concern Present (04/15/2021)   [place]    Feeling of Stress : Not at all  Social Connections: Moderately Integrated (04/15/2021)   Social Connection and Isolation Panel [NHANES]    Frequency of Communication with Friends and Family: More than three times a week    Frequency of Social Gatherings with Friends and Family: Once a week    Attends Religious Services: More than 4 times per year    Active Member of [employer] or Organizations: Yes    Attends Archivist Meetings: More than 4 times per year    Marital Status: Widowed     Review of Systems   Gen: Denies fever, chills, anorexia. Denies fatigue, weakness, weight loss.  CV: Denies chest pain, palpitations, syncope, peripheral edema, and claudication. Resp: Denies dyspnea at rest, cough, wheezing, coughing up blood, and pleurisy. GI: See HPI Derm: Denies rash, itching, dry skin Psych: Denies depression, anxiety, memory loss, confusion. No homicidal or suicidal ideation.  Heme: Denies bruising, bleeding, and enlarged lymph nodes.   Physical Exam   There were no vitals taken for this visit.  General:   Alert and oriented. No distress noted. Pleasant and cooperative.  Head:  Normocephalic and atraumatic. Eyes:  Conjuctiva clear without scleral icterus. Mouth:  Oral mucosa pink and moist. Good dentition. No lesions. Lungs:  Clear to auscultation bilaterally. No wheezes, rales, or rhonchi. No distress.  Heart:  S1, S2 present without murmurs appreciated.  Abdomen:   +BS, soft, non-tender and non-distended. No rebound or guarding. No HSM or masses noted. Rectal: *** Msk:  Symmetrical without gross deformities. Normal posture. Extremities:  Without edema. Neurologic:  Alert and  oriented x4 Psych:  Alert and cooperative. Normal mood and affect.   Assessment  Cynthia Cochran is a 72 y.o. female with a history of *** presenting today with    PLAN   ***     Venetia Night, MSN, FNP-BC, AGACNP-BC [facility]  GI Office Note    Referring Provider: Celene Squibb, MD Primary Care Physician:  Celene Squibb, MD Primary Gastroenterologist: ***  Date:  04/23/2022  ID:  Cynthia Cochran  Cynthia Cochran, Cynthia Cochran [birth year], MRN [id number]   Chief Complaint   No chief complaint on file.    History of Present Illness  Cynthia Cochran is a 72 y.o. female with a history of *** presenting today with complaint of     Current Outpatient Medications  Medication Sig Dispense Refill   acetaminophen (TYLENOL) 650 MG CR tablet Take 1,300 mg by mouth every 8 (eight) hours as needed for pain.     atorvastatin (LIPITOR) 80 MG tablet Take 80 mg by mouth at bedtime.      Cholecalciferol (DIALYVITE VITAMIN D 5000) 125 MCG (5000 UT) capsule Take 5,000 Units by mouth daily.     COLLAGEN PO Take 1 capsule by mouth daily.     HYDROcodone-acetaminophen (NORCO/VICODIN) 5-325 MG tablet Take 1-2 tablets by mouth every 6 (six) hours as needed. 6 tablet 0   losartan (COZAAR) 50 MG tablet Take 1 tablet (50 mg total) by mouth daily. 90 tablet 3   polyethylene glycol (MIRALAX / GLYCOLAX) 17 g packet Take 17 g by mouth daily. One capful daily in coffee     Psyllium (METAMUCIL PO) Take 1 Dose by mouth daily. 1 dose = 2 teaspoons     Specialty Vitamins Products (BIOTIN PLUS KERATIN PO) Take 1 capsule by mouth daily.     tiZANidine (ZANAFLEX) 4 MG tablet Take 1 tablet (4 mg total) by mouth every 6 (six) hours as needed for muscle spasms. 20 tablet 0   Turmeric Curcumin 500 MG  CAPS Take 500 mg by mouth daily.     Vibegron 75 MG TABS Take 1 tablet by mouth daily. 30 tablet 5   No current facility-administered medications for this visit.    Past Medical History:  Diagnosis Date   Anxiety    Arthritis    Blood transfusion without reported diagnosis 1982   after c section   Dysphagia 04/25/2016   Gastric ulcer    GERD (gastroesophageal reflux disease)    Heart murmur    High cholesterol 04/25/2016   Hyperlipidemia    Mixed stress and urge urinary incontinence 09/09/2015   Sciatic nerve pain    Spinal stenosis     Past Surgical History:  Procedure Laterality Date   BIOPSY  05/12/2016   Procedure: BIOPSY;  Surgeon: Rogene Houston, MD;  [facility];  Service: Endoscopy;;  gastric   BIOPSY  10/04/2018   Procedure: BIOPSY;  Surgeon: Rogene Houston, MD;  [facility];  Service: Endoscopy;;  gastric    CARPAL TUNNEL RELEASE     right; 1998, left [place]   COLONOSCOPY WITH PROPOFOL N/A 10/04/2018   Procedure: COLONOSCOPY WITH PROPOFOL;  Surgeon: Rogene Houston, MD;  [facility];  Service: Endoscopy;  Laterality: N/A;  7:30   Colonscopy     ESOPHAGEAL DILATION N/A 05/12/2016   Procedure: ESOPHAGEAL DILATION;  Surgeon: Rogene Houston, MD;  [facility];  Service: Endoscopy;  Laterality: N/A;   ESOPHAGOGASTRODUODENOSCOPY N/A 05/12/2016   Procedure: ESOPHAGOGASTRODUODENOSCOPY (EGD);  Surgeon: Rogene Houston, MD;  [facility];  Service: Endoscopy;  Laterality: N/A;  9:30   ESOPHAGOGASTRODUODENOSCOPY (EGD) WITH PROPOFOL N/A 10/04/2018   Procedure: ESOPHAGOGASTRODUODENOSCOPY (EGD) WITH PROPOFOL;  Surgeon: Rogene Houston, MD;  [facility];  Service: Endoscopy;  Laterality: N/A;   EYE SURGERY     lasix   FLEXIBLE SIGMOIDOSCOPY N/A 01/28/2021   Procedure: FLEXIBLE SIGMOIDOSCOPY WITH PROPOFOL;  Surgeon: Rogene Houston, MD;  [facility];  Service: Endoscopy;   Laterality: N/A;  9:30   JOINT REPLACEMENT     both hips   LUMBAR LAMINECTOMY/DECOMPRESSION MICRODISCECTOMY N/A 08/05/2020   Procedure: LAMINECTOMY THORACIC TEN-ELEVEN;  Surgeon: Ashok Pall, MD;  Location: [place];  Service: Neurosurgery;  Laterality: N/A;   LUMBAR LAMINECTOMY/DECOMPRESSION MICRODISCECTOMY N/A 10/14/2020   Procedure: Right Lumbar 4-5, Lumbar 5 Sacral 1 Lumbar and thoracic laminectomy;  Surgeon: Ashok Pall, MD;  Location: [place];  Service: Neurosurgery;  Laterality: N/A;  Right Lumbar 4-5, Lumbar 5 Sacral 1 Lumbar and thoracic laminectomy   TOTAL HIP ARTHROPLASTY     right; March 2013; left March 2011    Family History  Problem Relation Age of Onset   Other Mother        heavy smoker; died from arterial sclerosis   Colon cancer Father 73   Hypertension Sister    Colon cancer Brother 9   Stomach cancer Neg Hx    Breast cancer Neg Hx     Allergies as of 04/24/2022 - Review Complete 04/20/2022  Allergen Reaction Noted   Lyrica [pregabalin] Other (See Comments) 11/04/2018   Mobic [meloxicam] Other (See Comments) 09/25/2018   Nsaids Other (See Comments) 05/24/2018    Social History   Socioeconomic History   Marital status: Widowed    Spouse name: Not on file   Number of children: 0   Years of education: some college   Highest education level: Not on file  Occupational History   Occupation: Retired  Tobacco Use   Smoking status: Never   Smokeless tobacco: Never  Vaping Use   Vaping Use: Never used  Substance and Sexual Activity   Alcohol use: Yes    Comment: social drinking - a glass of wine about every 2-3 weeka   Drug use: No   Sexual activity: Not Currently    Birth control/protection: Post-menopausal, Abstinence  Other Topics Concern   Not on file  Social History Narrative   Lives alone   Caffeine use: no more than 2 cups per day    16 oz soda per day   Right handed    Social Determinants of Health   Financial Resource Strain: Low Risk   (04/15/2021)   Overall Financial Resource Strain (CARDIA)    Difficulty of Paying Living Expenses: Not hard at all  Food Insecurity: No Food Insecurity (04/15/2021)   Hunger Vital Sign    Worried About Running Out of Food in the Last Year: Never true    [place] in the Last Year: Never true  Transportation Needs: No Transportation Needs (04/15/2021)   PRAPARE - Hydrologist (Medical): No    Lack of Transportation (Non-Medical): No  Physical Activity: Insufficiently Active (04/15/2021)   Exercise Vital Sign    Days of Exercise per Week: 3 days    Minutes of Exercise per Session: 20 min  Stress: No Stress Concern Present (04/15/2021)   [place]    Feeling of Stress : Not at all  Social Connections: Moderately Integrated (04/15/2021)   Social Connection and Isolation Panel [NHANES]    Frequency of Communication with Friends and Family: More than three times a week    Frequency of Social Gatherings with Friends and Family: Once a week    Attends Religious Services: More than 4 times per year    Active Member of [employer] or Organizations:  Yes    Attends Club or Organization Meetings: More than 4 times per year    Marital Status: Widowed     Review of Systems   Gen: Denies fever, chills, anorexia. Denies fatigue, weakness, weight loss.  CV: Denies chest pain, palpitations, syncope, peripheral edema, and claudication. Resp: Denies dyspnea at rest, cough, wheezing, coughing up blood, and pleurisy. GI: See HPI Derm: Denies rash, itching, dry skin Psych: Denies depression, anxiety, memory loss, confusion. No homicidal or suicidal ideation.  Heme: Denies bruising, bleeding, and enlarged lymph nodes.   Physical Exam   There were no vitals taken for this visit.  General:   Alert and oriented. No distress noted. Pleasant and cooperative.  Head:  Normocephalic and atraumatic. Eyes:  Conjuctiva clear  without scleral icterus. Mouth:  Oral mucosa pink and moist. Good dentition. No lesions. Lungs:  Clear to auscultation bilaterally. No wheezes, rales, or rhonchi. No distress.  Heart:  S1, S2 present without murmurs appreciated.  Abdomen:  +BS, soft, non-tender and non-distended. No rebound or guarding. No HSM or masses noted. Rectal: *** Msk:  Symmetrical without gross deformities. Normal posture. Extremities:  Without edema. Neurologic:  Alert and  oriented x4 Psych:  Alert and cooperative. Normal mood and affect.   Assessment  Cynthia Cochran is a 72 y.o. female with a history of *** presenting today with    PLAN   ***     Venetia Night, MSN, FNP-BC, AGACNP-BC [facility]  GI Office Note    Referring Provider: Celene Squibb, MD Primary Care Physician:  Celene Squibb, MD Primary Gastroenterologist: ***  Date:  04/23/2022  ID:  Cynthia Cochran, DOB 08/19/1950, MRN [id number]   Chief Complaint   No chief complaint on file.    History of Present Illness  Cynthia Cochran is a 72 y.o. female with a history of *** presenting today with complaint of     Current Outpatient Medications  Medication Sig Dispense Refill   acetaminophen (TYLENOL) 650 MG CR tablet Take 1,300 mg by mouth every 8 (eight) hours as needed for pain.     atorvastatin (LIPITOR) 80 MG tablet Take 80 mg by mouth at bedtime.      Cholecalciferol (DIALYVITE VITAMIN D 5000) 125 MCG (5000 UT) capsule Take 5,000 Units by mouth daily.     COLLAGEN PO Take 1 capsule by mouth daily.     HYDROcodone-acetaminophen (NORCO/VICODIN) 5-325 MG tablet Take 1-2 tablets by mouth every 6 (six) hours as needed. 6 tablet 0   losartan (COZAAR) 50 MG tablet Take 1 tablet (50 mg total) by mouth daily. 90 tablet 3   polyethylene glycol (MIRALAX / GLYCOLAX) 17 g packet Take 17 g by mouth daily. One capful daily in coffee     Psyllium (METAMUCIL PO) Take 1 Dose by mouth daily. 1 dose = 2 teaspoons      Specialty Vitamins Products (BIOTIN PLUS KERATIN PO) Take 1 capsule by mouth daily.     tiZANidine (ZANAFLEX) 4 MG tablet Take 1 tablet (4 mg total) by mouth every 6 (six) hours as needed for muscle spasms. 20 tablet 0   Turmeric Curcumin 500 MG CAPS Take 500 mg by mouth daily.     Vibegron 75 MG TABS Take 1 tablet by mouth daily. 30 tablet 5   No current facility-administered medications for this visit.    Past Medical History:  Diagnosis Date   Anxiety    Arthritis    Blood transfusion without reported  diagnosis 1982   after c section   Dysphagia 04/25/2016   Gastric ulcer    GERD (gastroesophageal reflux disease)    Heart murmur    High cholesterol 04/25/2016   Hyperlipidemia    Mixed stress and urge urinary incontinence 09/09/2015   Sciatic nerve pain    Spinal stenosis     Past Surgical History:  Procedure Laterality Date   BIOPSY  05/12/2016   Procedure: BIOPSY;  Surgeon: Rogene Houston, MD;  [facility];  Service: Endoscopy;;  gastric   BIOPSY  10/04/2018   Procedure: BIOPSY;  Surgeon: Rogene Houston, MD;  [facility];  Service: Endoscopy;;  gastric    CARPAL TUNNEL RELEASE     right; 1998, left [place]   COLONOSCOPY WITH PROPOFOL N/A 10/04/2018   Procedure: COLONOSCOPY WITH PROPOFOL;  Surgeon: Rogene Houston, MD;  [facility];  Service: Endoscopy;  Laterality: N/A;  7:30   Colonscopy     ESOPHAGEAL DILATION N/A 05/12/2016   Procedure: ESOPHAGEAL DILATION;  Surgeon: Rogene Houston, MD;  [facility];  Service: Endoscopy;  Laterality: N/A;   ESOPHAGOGASTRODUODENOSCOPY N/A 05/12/2016   Procedure: ESOPHAGOGASTRODUODENOSCOPY (EGD);  Surgeon: Rogene Houston, MD;  [facility];  Service: Endoscopy;  Laterality: N/A;  9:30   ESOPHAGOGASTRODUODENOSCOPY (EGD) WITH PROPOFOL N/A 10/04/2018   Procedure: ESOPHAGOGASTRODUODENOSCOPY (EGD) WITH PROPOFOL;  Surgeon: Rogene Houston, MD;  [facility];  Service: Endoscopy;  Laterality: N/A;   EYE SURGERY     lasix   FLEXIBLE SIGMOIDOSCOPY N/A 01/28/2021   Procedure: FLEXIBLE SIGMOIDOSCOPY WITH PROPOFOL;  Surgeon: Rogene Houston, MD;  [facility];  Service: Endoscopy;  Laterality: N/A;  9:30   JOINT REPLACEMENT     both hips   LUMBAR LAMINECTOMY/DECOMPRESSION MICRODISCECTOMY N/A 08/05/2020   Procedure: LAMINECTOMY THORACIC TEN-ELEVEN;  Surgeon: Ashok Pall, MD;  Location: [place];  Service: Neurosurgery;  Laterality: N/A;   LUMBAR LAMINECTOMY/DECOMPRESSION MICRODISCECTOMY N/A 10/14/2020   Procedure: Right Lumbar 4-5, Lumbar 5 Sacral 1 Lumbar and thoracic laminectomy;  Surgeon: Ashok Pall, MD;  Location: [place];  Service: Neurosurgery;  Laterality: N/A;  Right Lumbar 4-5, Lumbar 5 Sacral 1 Lumbar and thoracic laminectomy   TOTAL HIP ARTHROPLASTY     right; March 2013; left March 2011    Family History  Problem Relation Age of Onset   Other Mother        heavy smoker; died from arterial sclerosis   Colon cancer Father 41   Hypertension Sister    Colon cancer Brother 56   Stomach cancer Neg Hx    Breast cancer Neg Hx     Allergies as of 04/24/2022 - Review Complete 04/20/2022  Allergen Reaction Noted   Lyrica [pregabalin] Other (See Comments) 11/04/2018   Mobic [meloxicam] Other (See Comments) 09/25/2018   Nsaids Other (See Comments) 05/24/2018    Social History   Socioeconomic History   Marital status: Widowed    Spouse name: Not on file   Number of children: 0   Years of education: some college   Highest education level: Not on file  Occupational History   Occupation: Retired  Tobacco Use   Smoking status: Never   Smokeless tobacco: Never  Vaping Use   Vaping Use: Never used  Substance and Sexual Activity   Alcohol use: Yes    Comment: social drinking - a glass of wine about every 2-3 weeka  Drug use: No   Sexual activity: Not Currently    Birth control/protection: Post-menopausal, Abstinence   Other Topics Concern   Not on file  Social History Narrative   Lives alone   Caffeine use: no more than 2 cups per day    16 oz soda per day   Right handed    Social Determinants of Health   Financial Resource Strain: Low Risk  (04/15/2021)   Overall Financial Resource Strain (CARDIA)    Difficulty of Paying Living Expenses: Not hard at all  Food Insecurity: No Food Insecurity (04/15/2021)   Hunger Vital Sign    Worried About Running Out of Food in the Last Year: Never true    Ran Out of Food in the Last Year: Never true  Transportation Needs: No Transportation Needs (04/15/2021)   PRAPARE - Hydrologist (Medical): No    Lack of Transportation (Non-Medical): No  Physical Activity: Insufficiently Active (04/15/2021)   Exercise Vital Sign    Days of Exercise per Week: 3 days    Minutes of Exercise per Session: 20 min  Stress: No Stress Concern Present (04/15/2021)   [place]    Feeling of Stress : Not at all  Social Connections: Moderately Integrated (04/15/2021)   Social Connection and Isolation Panel [NHANES]    Frequency of Communication with Friends and Family: More than three times a week    Frequency of Social Gatherings with Friends and Family: Once a week    Attends Religious Services: More than 4 times per year    Active Member of [employer] or Organizations: Yes    Attends Archivist Meetings: More than 4 times per year    Marital Status: Widowed     Review of Systems   Gen: Denies fever, chills, anorexia. Denies fatigue, weakness, weight loss.  CV: Denies chest pain, palpitations, syncope, peripheral edema, and claudication. Resp: Denies dyspnea at rest, cough, wheezing, coughing up blood, and pleurisy. GI: See HPI Derm: Denies rash, itching, dry skin Psych: Denies depression, anxiety, memory loss, confusion. No homicidal or suicidal ideation.  Heme: Denies bruising,  bleeding, and enlarged lymph nodes.   Physical Exam   There were no vitals taken for this visit.  General:   Alert and oriented. No distress noted. Pleasant and cooperative.  Head:  Normocephalic and atraumatic. Eyes:  Conjuctiva clear without scleral icterus. Mouth:  Oral mucosa pink and moist. Good dentition. No lesions. Lungs:  Clear to auscultation bilaterally. No wheezes, rales, or rhonchi. No distress.  Heart:  S1, S2 present without murmurs appreciated.  Abdomen:  +BS, soft, non-tender and non-distended. No rebound or guarding. No HSM or masses noted. Rectal: *** Msk:  Symmetrical without gross deformities. Normal posture. Extremities:  Without edema. Neurologic:  Alert and  oriented x4 Psych:  Alert and cooperative. Normal mood and affect.   Assessment  Cynthia Cochran is a 72 y.o. female with a history of *** presenting today with    PLAN   ***     Venetia Night, MSN, FNP-BC, AGACNP-BC [facility]

## 2022-04-24 ENCOUNTER — Ambulatory Visit: Payer: Medicare Other | Admitting: Gastroenterology

## 2022-05-01 ENCOUNTER — Encounter: Payer: Self-pay | Admitting: *Deleted

## 2022-05-02 ENCOUNTER — Encounter: Payer: Self-pay | Admitting: Obstetrics and Gynecology

## 2022-05-02 ENCOUNTER — Ambulatory Visit: Payer: Medicare Other | Admitting: Obstetrics and Gynecology

## 2022-05-02 VITALS — BP 195/102 | HR 65

## 2022-05-02 DIAGNOSIS — N3281 Overactive bladder: Secondary | ICD-10-CM | POA: Diagnosis not present

## 2022-05-02 NOTE — Progress Notes (Signed)
Cynthia Cochran Return Visit  SUBJECTIVE  History of Present Illness: Cynthia Cochran is a 72 y.o. female seen in follow-up for incontinence and pelvic pain. Previously underwent trigger point injections for levator spasm and has been attending pelvic PT. She was seen by Dr Dema Severin (Colorectal) and had an MRI defocography and anoscopy, and no abnormalities were noted. He recommended continued pelvic PT.  She had been on been Gemtesa 26m. For the first 4 weeks, she had been taking twice the amount of BP meds by accident and it was causing her to urinate more. Then for about 2-3 weeks after she stopped that, she did notice that her urgency decreased. Then her insurance changed and she did not try to fill the prescription because she was unsure about the cost. Has rare leakage with cough/ sneeze.    Past Medical History: Patient  has a past medical history of Anxiety, Arthritis, Blood transfusion without reported diagnosis (1982), Dysphagia (04/25/2016), Gastric ulcer, GERD (gastroesophageal reflux disease), Heart murmur, High cholesterol (04/25/2016), Hyperlipidemia, Mixed stress and urge urinary incontinence (09/09/2015), Sciatic nerve pain, and Spinal stenosis.   Past Surgical History: She  has a past surgical history that includes Cesarean section (1982); Carpal tunnel release; Total hip arthroplasty; Colonscopy; Esophagogastroduodenoscopy (N/A, 05/12/2016); Esophageal dilation (N/A, 05/12/2016); biopsy (05/12/2016); Colonoscopy with propofol (N/A, 10/04/2018); Esophagogastroduodenoscopy (egd) with propofol (N/A, 10/04/2018); biopsy (10/04/2018); Joint replacement; Eye surgery; Lumbar laminectomy/decompression microdiscectomy (N/A, 08/05/2020); Lumbar laminectomy/decompression microdiscectomy (N/A, 10/14/2020); and Flexible sigmoidoscopy (N/A, 01/28/2021).   Medications: She has a current medication list which includes the following prescription(s): acetaminophen, atorvastatin, dialyvite vitamin d 5000,  collagen, hydrocodone-acetaminophen, losartan, polyethylene glycol, psyllium, specialty vitamins products, tizanidine, turmeric curcumin, and vibegron.   Allergies: Patient is allergic to lyrica [pregabalin], mobic [meloxicam], and nsaids.   Social History: Patient  reports that she has never smoked. She has never used smokeless tobacco. She reports current alcohol use. She reports that she does not use drugs.      OBJECTIVE     Physical Exam: Vitals:   05/02/22 1235  BP: (!) 195/102  Pulse: 65   Gen: No apparent distress, A&O x 3.  Detailed Urogynecologic Evaluation:  Deferred.    POC urine: negative  ASSESSMENT AND PLAN    Cynthia Cochran a 72y.o. with:  1. Overactive bladder     - Continue Gemtesa 739mdaily. A few weeks of samples provided and she will call the pharmacy to investigate costs. Not a candidate for myrbetriq due to elevated BP. Also has been on oxybutynin in the past.  - Previously had discussed option of SNM - Continue with pelvic PT   Return 2 months or sooner if needed  Cynthia Cochran  Time spent: I spent 20 minutes dedicated to the care of this patient on the date of this encounter to include pre-visit review of records, face-to-face time with the patient and post visit documentation.

## 2022-05-11 ENCOUNTER — Encounter: Payer: Medicare Other | Admitting: Physical Therapy

## 2022-05-11 ENCOUNTER — Encounter: Payer: Self-pay | Admitting: Physical Therapy

## 2022-05-11 DIAGNOSIS — R278 Other lack of coordination: Secondary | ICD-10-CM

## 2022-05-11 DIAGNOSIS — R252 Cramp and spasm: Secondary | ICD-10-CM | POA: Diagnosis not present

## 2022-05-11 NOTE — Therapy (Signed)
OUTPATIENT PHYSICAL THERAPY TREATMENT NOTE   Patient Name: Cynthia Cochran MRN: [id number] DOB:November 03, 1950, 72 y.o., female Today's Date: 05/11/2022  PCP: Celene Squibb, MD  REFERRING PROVIDER: Jaquita Folds, MD    END OF SESSION:   PT End of Session - 05/11/22 1601     Visit Number 2    Date for PT Re-Evaluation 07/13/22    Authorization Type Medicare    Authorization - Visit Number 2    Authorization - Number of Visits 10    PT Start Time 1600    PT Stop Time 1645    PT Time Calculation (min) 45 min    Activity Tolerance Patient tolerated treatment well    Behavior During Therapy [facility] for tasks assessed/performed             Past Medical History:  Diagnosis Date   Anxiety    Arthritis    Blood transfusion without reported diagnosis 1982   after c section   Dysphagia 04/25/2016   Gastric ulcer    GERD (gastroesophageal reflux disease)    Heart murmur    High cholesterol 04/25/2016   Hyperlipidemia    Mixed stress and urge urinary incontinence 09/09/2015   Sciatic nerve pain    Spinal stenosis    Past Surgical History:  Procedure Laterality Date   BIOPSY  05/12/2016   Procedure: BIOPSY;  Surgeon: Rogene Houston, MD;  [facility];  Service: Endoscopy;;  gastric   BIOPSY  10/04/2018   Procedure: BIOPSY;  Surgeon: Rogene Houston, MD;  [facility];  Service: Endoscopy;;  gastric    CARPAL TUNNEL RELEASE     right; 1998, left [place]   COLONOSCOPY WITH PROPOFOL N/A 10/04/2018   Procedure: COLONOSCOPY WITH PROPOFOL;  Surgeon: Rogene Houston, MD;  [facility];  Service: Endoscopy;  Laterality: N/A;  7:30   Colonscopy     ESOPHAGEAL DILATION N/A 05/12/2016   Procedure: ESOPHAGEAL DILATION;  Surgeon: Rogene Houston, MD;  [facility];  Service: Endoscopy;  Laterality: N/A;   ESOPHAGOGASTRODUODENOSCOPY N/A 05/12/2016   Procedure: ESOPHAGOGASTRODUODENOSCOPY (EGD);  Surgeon: Rogene Houston, MD;   [facility];  Service: Endoscopy;  Laterality: N/A;  9:30   ESOPHAGOGASTRODUODENOSCOPY (EGD) WITH PROPOFOL N/A 10/04/2018   Procedure: ESOPHAGOGASTRODUODENOSCOPY (EGD) WITH PROPOFOL;  Surgeon: Rogene Houston, MD;  [facility];  Service: Endoscopy;  Laterality: N/A;   EYE SURGERY     lasix   FLEXIBLE SIGMOIDOSCOPY N/A 01/28/2021   Procedure: FLEXIBLE SIGMOIDOSCOPY WITH PROPOFOL;  Surgeon: Rogene Houston, MD;  [facility];  Service: Endoscopy;  Laterality: N/A;  9:30   JOINT REPLACEMENT     both hips   LUMBAR LAMINECTOMY/DECOMPRESSION MICRODISCECTOMY N/A 08/05/2020   Procedure: LAMINECTOMY THORACIC TEN-ELEVEN;  Surgeon: Ashok Pall, MD;  Location: [place];  Service: Neurosurgery;  Laterality: N/A;   LUMBAR LAMINECTOMY/DECOMPRESSION MICRODISCECTOMY N/A 10/14/2020   Procedure: Right Lumbar 4-5, Lumbar 5 Sacral 1 Lumbar and thoracic laminectomy;  Surgeon: Ashok Pall, MD;  Location: [place];  Service: Neurosurgery;  Laterality: N/A;  Right Lumbar 4-5, Lumbar 5 Sacral 1 Lumbar and thoracic laminectomy   TOTAL HIP ARTHROPLASTY     right; March 2013; left March 2011   Patient Active Problem List   Diagnosis Date Noted   Encounter for counseling 01/09/2022   Pelvic relaxation due to cystocele, midline 04/15/2021   Postmenopausal 04/15/2021   Rectal pain  04/05/2021   Anal fissure 12/21/2020   Constipation 08/12/2020   Thoracic spinal stenosis 08/05/2020   Gait disturbance 11/04/2018   Numbness and tingling 11/04/2018   Lumbar radiculopathy 11/04/2018   Cervical stenosis of spinal canal 11/04/2018   PUD (peptic ulcer disease) 05/10/2018   Anemia 05/10/2018   Special screening for malignant neoplasms, colon 05/10/2018   Abdominal pain 04/07/2018   Hypokalemia 04/07/2018   Osteoarthritis 04/07/2018   Elevated blood pressure reading 04/07/2018   Family hx of colon cancer 02/27/2018   Screening for colorectal cancer 11/06/2017   Encounter for well woman exam  with routine gynecological exam 11/06/2017   High cholesterol 04/25/2016   Dysphagia 04/25/2016   Esophageal dysphagia 04/25/2016   Mixed stress and urge urinary incontinence 09/09/2015   REFERRING DIAG: [id number] (ICD-10-CM) - Levator spasm    THERAPY DIAG:  Cramp and spasm   Other lack of coordination   Rationale for Evaluation and Treatment: Rehabilitation   ONSET DATE: 2021   SUBJECTIVE:                                                                                                                                                                                            SUBJECTIVE STATEMENT: No changes at the time.  Patient feels like she is sitting on a tennis ball.      PAIN:  Are you having pain? Yes NPRS scale: 10/10  Pain location: moves from the rectum to the tailbone   Pain type: discomfort Pain description: intermittent    Aggravating factors: sitting, standing Relieving factors: laying on side with pillow between knees   PRECAUTIONS: None   WEIGHT BEARING RESTRICTIONS: No   FALLS:  Has patient fallen in last 6 months? Yes. Number of falls 01/11/22 in garage   LIVING ENVIRONMENT: Lives with: lives alone     OCCUPATION: retired   PLOF: Independent   PATIENT GOALS: reduce discomfort   PERTINENT HISTORY:  C-section; bil. Hips replaced; lumbar laminectomy    BOWEL MOVEMENT: Pain with bowel movement: No Type of bowel movement:Type (Bristol Stool Scale) Type 6, Frequency daily, and Strain No Fully empty rectum: No, sometimes has to go a second time Leakage: No     URINATION: Pain with urination: No Fully empty bladder: Yes:   Stream: Strong Urgency: Yes: sometimes Frequency: every 2-3 hours Leakage: Sneezing and Laughing Pads: Yes:     INTERCOURSE:not sexually active    PREGNANCY: C-section deliveries C-section deliveries 1 still born      OBJECTIVE:    COGNITION: Overall cognitive status: Within functional limits for tasks assessed  POSTURE: posterior pelvic tilt   PELVIC ALIGNMENT:   LUMBARAROM/PROM:   A/PROM A/PROM  eval  Extension Decreased by 50% with pain in right SI area  Left lateral flexion Decreased by 25% with pain across the sacrum   (Blank rows = not tested)   LOWER EXTREMITY [id number] hip ROM is full     LOWER EXTREMITY MMT:   MMT Right eval Left eval  Hip extension 4/5 4/5  Hip abduction 4/5 4/5    PALPATION:   General  tenderness located in the left gluteal and piriformis                 External Perineal Exam decreased sensation on the left S3, S4, and S5 dermatome                             Internal Pelvic Floor tenderness located on the left alcock canal, along the puborectalis, and iliococcygeus, difficulty pushing the therapist finger all the way out of the rectum   Patient confirms identification and approves PT to assess internal pelvic floor and treatment Yes   PELVIC MMT:   MMT eval  Internal Anal Sphincter 2/5 for 2 sec  External Anal Sphincter 2/5  Puborectalis 2/5  (Blank rows = not tested)         TONE: average     TODAY'S TREATMENT:      05/11/22 Manual: Trigger Point Dry-Needling  Treatment instructions: Expect mild to moderate muscle soreness. S/S of pneumothorax if dry needled over a lung field, and to seek immediate medical attention should they occur. Patient verbalized understanding of these instructions and education.  Patient Consent Given: Yes Education handout provided: Previously provided Muscles treated: bilateral gluteus miniimus, gluteus medius, piriformis Electrical stimulation performed: No Parameters: N/A Treatment response/outcome: elongation of muscle and trigger point response  Soft tissue mobilization: To assess for dry needling Manual work to bilateral gluteal and piriformis to elongate after dry needling Manual work to the coccygeus and levator ani externally while patient was prone Spinal  mobilization: Mobilization of the left SI joint Neuromuscular re-education: Pelvic floor contraction training: Pelvic floor contraction in prone with tactile cues holding 5 sec [id number] Quick flicks in prone [id number] training: Sitting on a ball and performing diaphragmatic breathing to relax the pelvic floor                                                                                                                          DATE: 04/20/22  EVAL finished the eval and explained findings and future treatment      PATIENT EDUCATION:  Education details: using her pelvic floor wand Person educated: Patient Education method: Explanation Education comprehension: verbalized understanding   HOME EXERCISE PROGRAM: See above.    ASSESSMENT:   CLINICAL IMPRESSION: Patient is a 72 y.o. female who was seen today for physical therapy  treatment for levator ani spasm. Patient was able to feel  the pelvic floor drop in the ball with diaphragmatic breathing. She had trigger point response from the dry needling and reduction of pain. Patient is able to contract the anus with a lift. Patient will benefit from skilled therapy to reduce her pressure so she is able to stand and sit with increased comfort.    OBJECTIVE IMPAIRMENTS: decreased activity tolerance, decreased endurance, decreased strength, increased fascial restrictions, and pain.    ACTIVITY LIMITATIONS: sitting and standing   PARTICIPATION LIMITATIONS: meal prep, cleaning, shopping, and community activity   PERSONAL FACTORS: C-section; bil. Hips replaced; lumbar laminectomy  are also affecting patient's functional outcome.    REHAB POTENTIAL: Good   CLINICAL DECISION MAKING: Evolving/moderate complexity   EVALUATION COMPLEXITY: Moderate     GOALS: Goals reviewed with patient? Yes   SHORT TERM GOALS: Target date: 05/18/22   Patient is independent with initial HEP for stretches, nerve stretch, and diaphragmatic breathing Baseline: Goal  status: INITIAL   2.  Patient reports the pressure feeling decreased </= 25%.  Baseline:  Goal status: INITIAL     LONG TERM GOALS: Target date: 07/13/2022   Patient independent with initial HEP for pelvic floor strength and elongation.  Baseline:  Goal status: INITIAL   2.  Patient able to sit for >/= 45 minutes with pressure feeling decreased </= 70%.  Baseline:  Goal status: INITIAL   3.  Patient able to stand for 30 minutes at the store with pressure feeling decreased </= 70%.  Baseline:  Goal status: INITIAL   4.  Patient is able to contract the pelvic floor for 15 seconds with strength of 3/5.  Baseline:  Goal status: INITIAL   5.  Patient is able to push the therapist finger out of the rectum when bearing down.  Baseline:  Goal status: INITIAL       PLAN:   PT FREQUENCY: 1x/week   PT DURATION: 12 weeks   PLANNED INTERVENTIONS: Therapeutic exercises, Therapeutic activity, Neuromuscular re-education, Patient/Family education, Joint mobilization, Dry Needling, Electrical stimulation, Cryotherapy, Moist heat, Biofeedback, and Manual therapy   PLAN FOR NEXT SESSION: dry needle to the piriformis, mobilization to sacrum, manual work to the piriformis, diaphragmatic breathing, nerve stretch to left   Earlie Counts, PT 05/11/22 4:57 PM

## 2022-05-23 IMAGING — MG MM DIGITAL SCREENING BILAT W/ TOMO AND CAD
8 series · 8 of 24 positions shown · non-contrast
Comparison: Previous exam(s).

CLINICAL DATA: Screening.

EXAM:
DIGITAL SCREENING BILATERAL MAMMOGRAM WITH TOMOSYNTHESIS AND CAD
TECHNIQUE: Bilateral screening digital craniocaudal and mediolateral oblique
mammograms were obtained. Bilateral screening digital breast
tomosynthesis was performed. The images were evaluated with
computer-aided detection.

[L CC synth-2D]
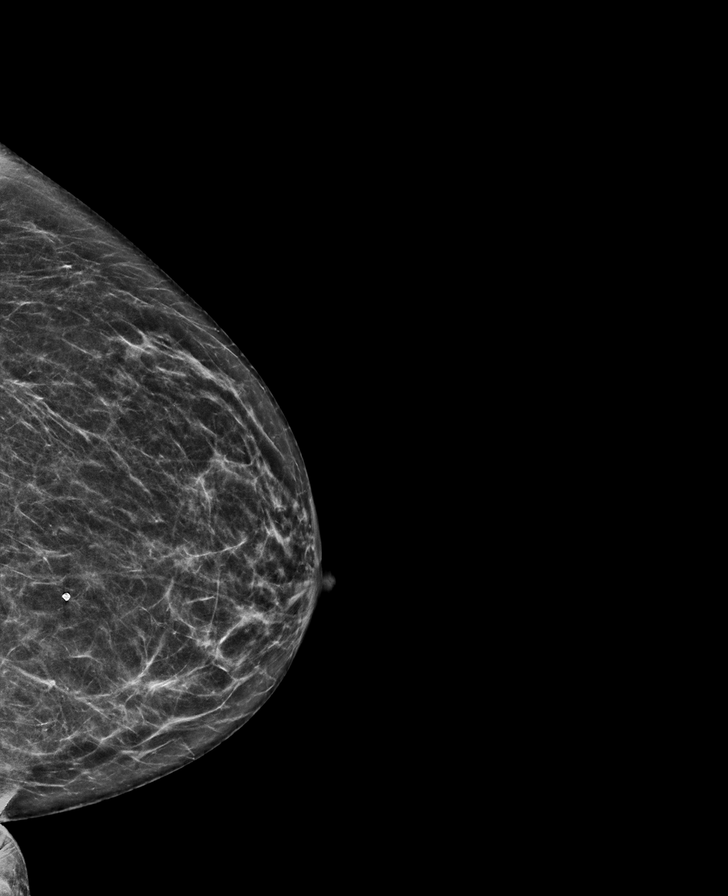

[L MLO synth-2D]
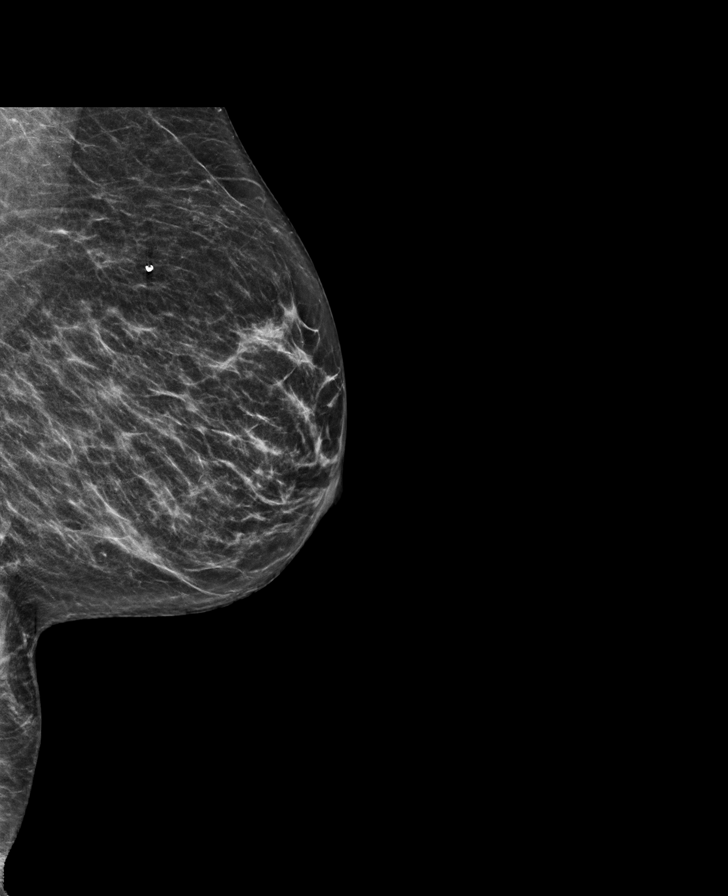

[R MLO synth-2D]
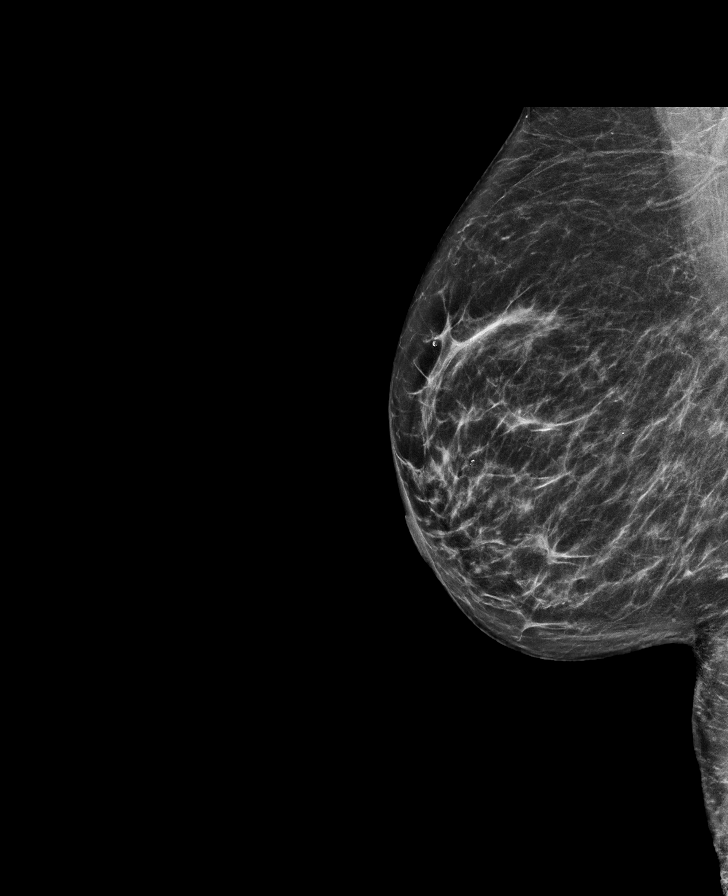

[R CC synth-2D]
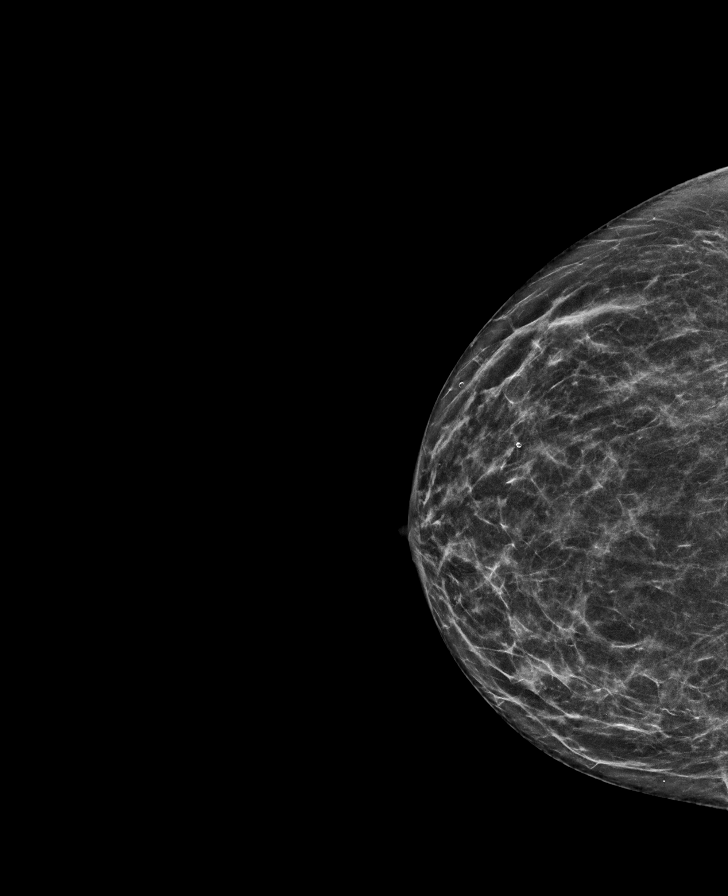

[R CC tomo · tomo slice 31/62.0]
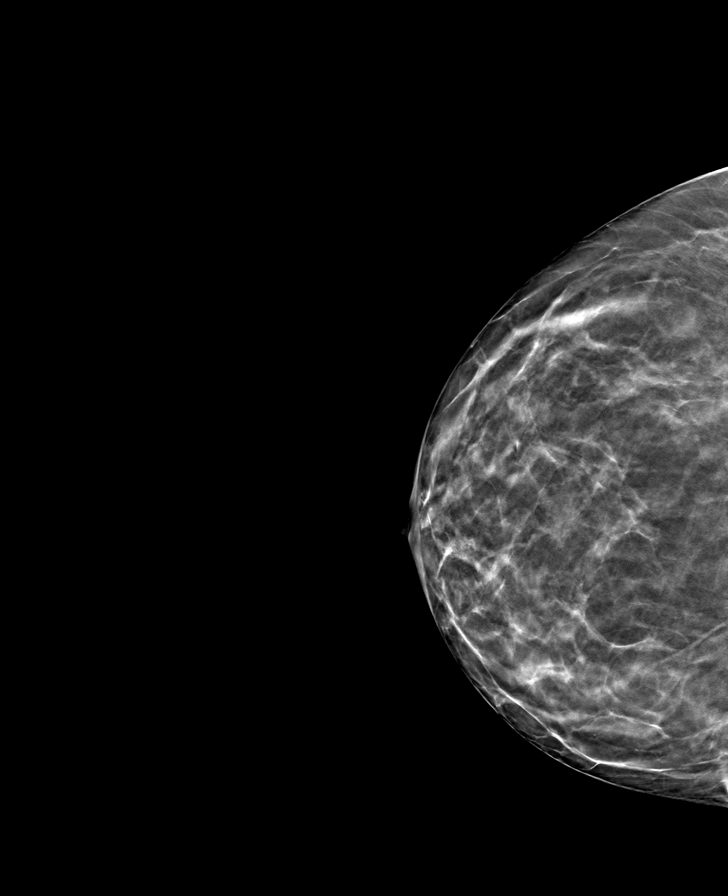

[R MLO tomo · tomo slice 35/69.0]
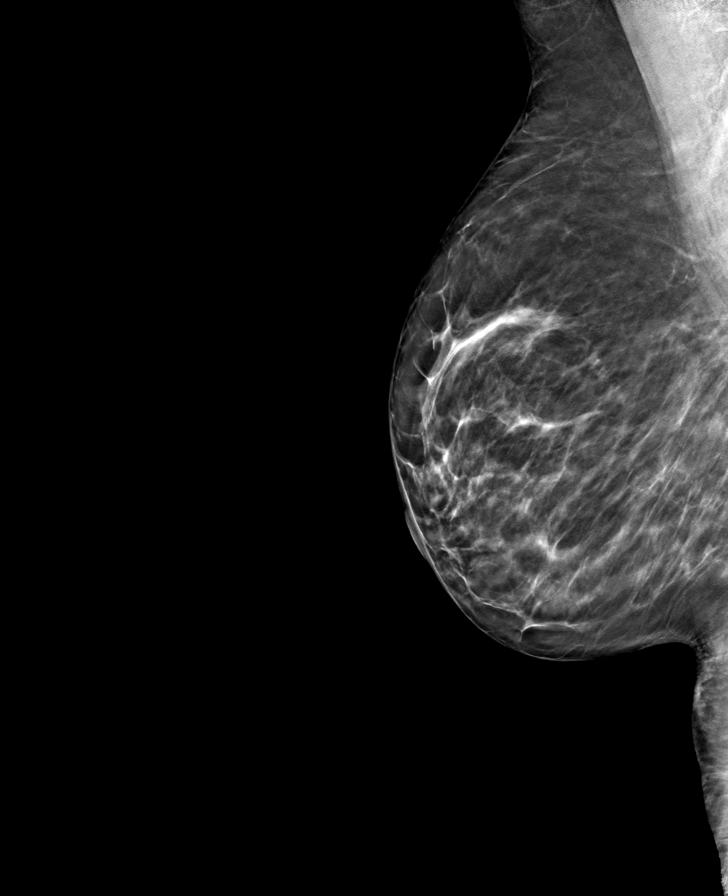

[L CC tomo · tomo slice 31/60.0]
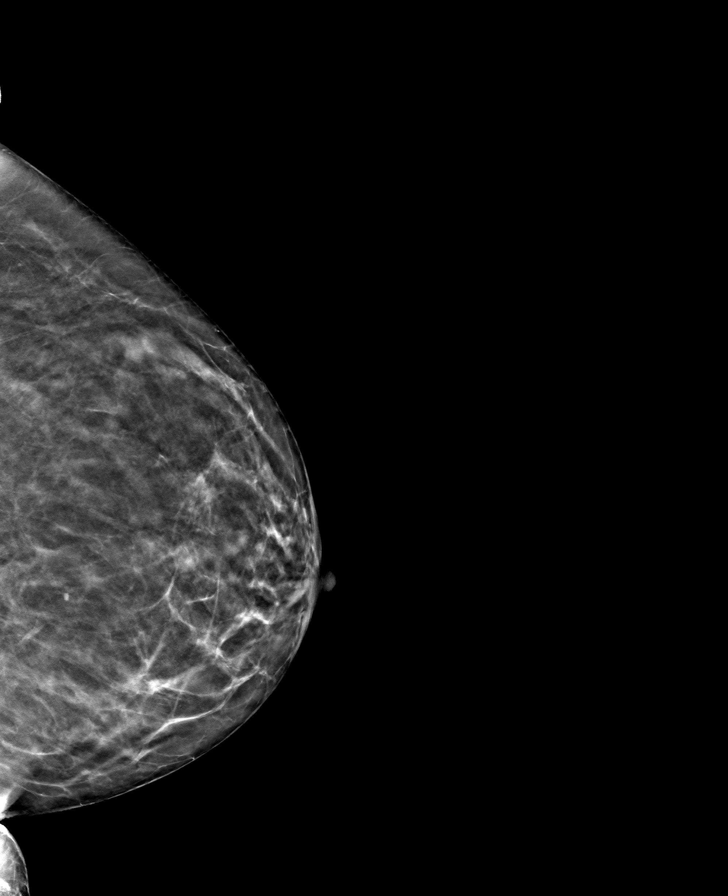

[L MLO tomo · tomo slice 31/61.0]
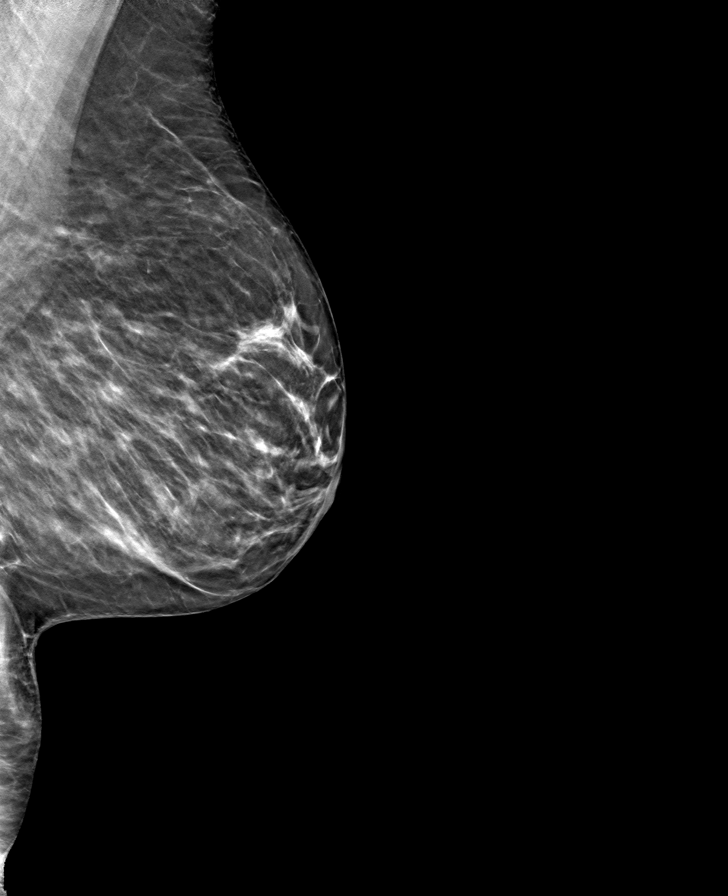

[8 of 24 positions shown; findings below may reference images not displayed]

ACR Breast Density Category c: The breast tissue is heterogeneously
dense, which may obscure small masses.
FINDINGS: There are no findings suspicious for malignancy.
IMPRESSION: No mammographic evidence of malignancy. A result letter of this
screening mammogram will be mailed directly to the patient.

RECOMMENDATION:
Screening mammogram in one year. (Code:Q3-W-BC3)

BI-RADS CATEGORY  1: Negative.

## 2022-05-30 ENCOUNTER — Encounter: Payer: Self-pay | Admitting: Physical Therapy

## 2022-05-30 ENCOUNTER — Encounter: Payer: Medicare Other | Attending: Obstetrics and Gynecology | Admitting: Physical Therapy

## 2022-05-30 DIAGNOSIS — R252 Cramp and spasm: Secondary | ICD-10-CM | POA: Insufficient documentation

## 2022-05-30 DIAGNOSIS — R278 Other lack of coordination: Secondary | ICD-10-CM | POA: Insufficient documentation

## 2022-05-30 NOTE — Therapy (Signed)
OUTPATIENT PHYSICAL THERAPY TREATMENT NOTE   Patient Name: Cynthia Cochran MRN: [id number] DOB:1950-04-26, 72 y.o., female Today's Date: 05/30/2022  PCP: Celene Squibb, MD  REFERRING PROVIDER: Jaquita Folds, MD    END OF SESSION:   PT End of Session - 05/30/22 1513     Visit Number 3    Date for PT Re-Evaluation 07/13/22    Authorization Type Medicare    Authorization - Visit Number 3    Authorization - Number of Visits 10    PT Start Time 1500    PT Stop Time 1545    PT Time Calculation (min) 45 min    Activity Tolerance Patient tolerated treatment well    Behavior During Therapy [facility] for tasks assessed/performed             Past Medical History:  Diagnosis Date   Anxiety    Arthritis    Blood transfusion without reported diagnosis 1982   after c section   Dysphagia 04/25/2016   Gastric ulcer    GERD (gastroesophageal reflux disease)    Heart murmur    High cholesterol 04/25/2016   Hyperlipidemia    Mixed stress and urge urinary incontinence 09/09/2015   Sciatic nerve pain    Spinal stenosis    Past Surgical History:  Procedure Laterality Date   BIOPSY  05/12/2016   Procedure: BIOPSY;  Surgeon: Rogene Houston, MD;  [facility];  Service: Endoscopy;;  gastric   BIOPSY  10/04/2018   Procedure: BIOPSY;  Surgeon: Rogene Houston, MD;  [facility];  Service: Endoscopy;;  gastric    CARPAL TUNNEL RELEASE     right; 1998, left [place]   COLONOSCOPY WITH PROPOFOL N/A 10/04/2018   Procedure: COLONOSCOPY WITH PROPOFOL;  Surgeon: Rogene Houston, MD;  [facility];  Service: Endoscopy;  Laterality: N/A;  7:30   Colonscopy     ESOPHAGEAL DILATION N/A 05/12/2016   Procedure: ESOPHAGEAL DILATION;  Surgeon: Rogene Houston, MD;  [facility];  Service: Endoscopy;  Laterality: N/A;   ESOPHAGOGASTRODUODENOSCOPY N/A 05/12/2016   Procedure: ESOPHAGOGASTRODUODENOSCOPY (EGD);  Surgeon: Rogene Houston, MD;   [facility];  Service: Endoscopy;  Laterality: N/A;  9:30   ESOPHAGOGASTRODUODENOSCOPY (EGD) WITH PROPOFOL N/A 10/04/2018   Procedure: ESOPHAGOGASTRODUODENOSCOPY (EGD) WITH PROPOFOL;  Surgeon: Rogene Houston, MD;  [facility];  Service: Endoscopy;  Laterality: N/A;   EYE SURGERY     lasix   FLEXIBLE SIGMOIDOSCOPY N/A 01/28/2021   Procedure: FLEXIBLE SIGMOIDOSCOPY WITH PROPOFOL;  Surgeon: Rogene Houston, MD;  [facility];  Service: Endoscopy;  Laterality: N/A;  9:30   JOINT REPLACEMENT     both hips   LUMBAR LAMINECTOMY/DECOMPRESSION MICRODISCECTOMY N/A 08/05/2020   Procedure: LAMINECTOMY THORACIC TEN-ELEVEN;  Surgeon: Ashok Pall, MD;  Location: [place];  Service: Neurosurgery;  Laterality: N/A;   LUMBAR LAMINECTOMY/DECOMPRESSION MICRODISCECTOMY N/A 10/14/2020   Procedure: Right Lumbar 4-5, Lumbar 5 Sacral 1 Lumbar and thoracic laminectomy;  Surgeon: Ashok Pall, MD;  Location: [place];  Service: Neurosurgery;  Laterality: N/A;  Right Lumbar 4-5, Lumbar 5 Sacral 1 Lumbar and thoracic laminectomy   TOTAL HIP ARTHROPLASTY     right; March 2013; left March 2011   Patient Active Problem List   Diagnosis Date Noted   Encounter for counseling 01/09/2022   Pelvic relaxation due to cystocele, midline 04/15/2021   Postmenopausal 04/15/2021   Rectal pain  04/05/2021   Anal fissure 12/21/2020   Constipation 08/12/2020   Thoracic spinal stenosis 08/05/2020   Gait disturbance 11/04/2018   Numbness and tingling 11/04/2018   Lumbar radiculopathy 11/04/2018   Cervical stenosis of spinal canal 11/04/2018   PUD (peptic ulcer disease) 05/10/2018   Anemia 05/10/2018   Special screening for malignant neoplasms, colon 05/10/2018   Abdominal pain 04/07/2018   Hypokalemia 04/07/2018   Osteoarthritis 04/07/2018   Elevated blood pressure reading 04/07/2018   Family hx of colon cancer 02/27/2018   Screening for colorectal cancer 11/06/2017   Encounter for well woman exam  with routine gynecological exam 11/06/2017   High cholesterol 04/25/2016   Dysphagia 04/25/2016   Esophageal dysphagia 04/25/2016   Mixed stress and urge urinary incontinence 09/09/2015   REFERRING DIAG: W11.914 (ICD-10-CM) - Levator spasm    THERAPY DIAG:  Cramp and spasm   Other lack of coordination   Rationale for Evaluation and Treatment: Rehabilitation   ONSET DATE: 2021   SUBJECTIVE:                                                                                                                                                                                            SUBJECTIVE STATEMENT: I feel the ball feeling is smaller along the rectum and lower back. The dry needling did not help as much from last time.  Patient feels like she is sitting on a tennis ball.      PAIN:  Are you having pain? Yes NPRS scale: 6/10  Pain location: moves from the rectum to the tailbone   Pain type: discomfort Pain description: intermittent    Aggravating factors: sitting, standing; more discomfort with ball feeling in sitting and tightness in standing.  Relieving factors: laying on side with pillow between knees   PRECAUTIONS: None   WEIGHT BEARING RESTRICTIONS: No   FALLS:  Has patient fallen in last 6 months? Yes. Number of falls 01/11/22 in garage   LIVING ENVIRONMENT: Lives with: lives alone     OCCUPATION: retired   PLOF: Independent   PATIENT GOALS: reduce discomfort   PERTINENT HISTORY:  C-section; bil. Hips replaced; lumbar laminectomy    BOWEL MOVEMENT: Pain with bowel movement: No Type of bowel movement:Type (Bristol Stool Scale) Type 6, Frequency daily, and Strain No Fully empty rectum: No, sometimes has to go a second time Leakage: No     URINATION: Pain with urination: No Fully empty bladder: Yes:   Stream: Strong Urgency: Yes: sometimes Frequency: every 2-3 hours Leakage: Sneezing and Laughing Pads: Yes:     INTERCOURSE:not sexually active     PREGNANCY: C-section deliveries  C-section deliveries 1 still born      OBJECTIVE:    COGNITION: Overall cognitive status: Within functional limits for tasks assessed                            POSTURE: posterior pelvic tilt   PELVIC ALIGNMENT:   LUMBARAROM/PROM:   A/PROM A/PROM  eval  Extension Decreased by 50% with pain in right SI area  Left lateral flexion Decreased by 25% with pain across the sacrum   (Blank rows = not tested)   LOWER EXTREMITY [id number] hip ROM is full  05/30/22; Positive slump test on the right.     LOWER EXTREMITY MMT:   MMT Right eval Left eval  Hip extension 4/5 4/5  Hip abduction 4/5 4/5    PALPATION:   General  tenderness located in the left gluteal and piriformis                 External Perineal Exam decreased sensation on the left S3, S4, and S5 dermatome                             Internal Pelvic Floor tenderness located on the left alcock canal, along the puborectalis, and iliococcygeus, difficulty pushing the therapist finger all the way out of the rectum   Patient confirms identification and approves PT to assess internal pelvic floor and treatment Yes   PELVIC MMT:   MMT eval  Internal Anal Sphincter 2/5 for 2 sec  External Anal Sphincter 2/5  Puborectalis 2/5  (Blank rows = not tested)         TONE: average     TODAY'S TREATMENT:      05/30/22 Manual: Soft tissue mobilization: Nerve mobilization to right leg Manual stretch of hip rotators in prone Manual work to the right quadratus and right gluteal Manual stretch of the quadriceps in prone Spinal mobilization: PA and rotational mobilization to L1-L5 Sacral mobilization for right rotation Sideglide to right side fo L3-L5  Exercises: Strengthening: Dead lift without weight working on gluteal length Single leg T exercises with hand on counter 10x each side Sidely hip abduction with ball press    05/11/22 Manual: Trigger Point Dry-Needling  Treatment  instructions: Expect mild to moderate muscle soreness. S/S of pneumothorax if dry needled over a lung field, and to seek immediate medical attention should they occur. Patient verbalized understanding of these instructions and education.   Patient Consent Given: Yes Education handout provided: Previously provided Muscles treated: bilateral gluteus miniimus, gluteus medius, piriformis Electrical stimulation performed: No Parameters: N/A Treatment response/outcome: elongation of muscle and trigger point response   Soft tissue mobilization: To assess for dry needling Manual work to bilateral gluteal and piriformis to elongate after dry needling Manual work to the coccygeus and levator ani externally while patient was prone Spinal mobilization: Mobilization of the left SI joint Neuromuscular re-education: Pelvic floor contraction training: Pelvic floor contraction in prone with tactile cues holding 5 sec [id number] Quick flicks in prone [id number] Down training: Sitting on a ball and performing diaphragmatic breathing to relax the pelvic floor  PATIENT EDUCATION: Education details: Access Code: [email or web address] Person educated: Patient Education method: Explanation, Demonstration, Corporate treasurer cues, Verbal cues, and Handouts Education comprehension: verbalized understanding, returned demonstration, verbal cues required, tactile cues required, and needs further education     HOME EXERCISE PROGRAM: 05/29/22 Access Code: [email or web address] URL: [email or web address] Date: 05/30/2022 Prepared by: Earlie Counts  Exercises - Kettlebell Deadlift  - 1 x daily - 3 x weekly - 1 sets - 10 reps - Forward T with Counter Support  - 1 x daily - 3 x weekly - 1 sets - 10 reps   ASSESSMENT:   CLINICAL IMPRESSION: Patient is a 72 y.o. female who was seen today for physical therapy  treatment for  levator ani spasm.  Slump test on the right increases her pain. Patient is able to feel the pelvic floor contract with her exercise for the first time. She is having less of the ball feeling in her rectum. She is not exercising in the gym with a personal trainer 2 times per week. Patient will benefit from skilled therapy to reduce her pressure so she is able to stand and sit with increased comfort.    OBJECTIVE IMPAIRMENTS: decreased activity tolerance, decreased endurance, decreased strength, increased fascial restrictions, and pain.    ACTIVITY LIMITATIONS: sitting and standing   PARTICIPATION LIMITATIONS: meal prep, cleaning, shopping, and community activity   PERSONAL FACTORS: C-section; bil. Hips replaced; lumbar laminectomy  are also affecting patient's functional outcome.    REHAB POTENTIAL: Good   CLINICAL DECISION MAKING: Evolving/moderate complexity   EVALUATION COMPLEXITY: Moderate     GOALS: Goals reviewed with patient? Yes   SHORT TERM GOALS: Target date: 05/18/22   Patient is independent with initial HEP for stretches, nerve stretch, and diaphragmatic breathing Baseline: Goal status: INITIAL   2.  Patient reports the pressure feeling decreased </= 25%.  Baseline:  Goal status: Met 05/30/22     LONG TERM GOALS: Target date: 07/13/2022   Patient independent with initial HEP for pelvic floor strength and elongation.  Baseline:  Goal status: INITIAL   2.  Patient able to sit for >/= 45 minutes with pressure feeling decreased </= 70%.  Baseline:  Goal status: INITIAL   3.  Patient able to stand for 30 minutes at the store with pressure feeling decreased </= 70%.  Baseline:  Goal status: INITIAL   4.  Patient is able to contract the pelvic floor for 15 seconds with strength of 3/5.  Baseline:  Goal status: INITIAL   5.  Patient is able to push the therapist finger out of the rectum when bearing down.  Baseline:  Goal status: INITIAL       PLAN:   PT  FREQUENCY: 1x/week   PT DURATION: 12 weeks   PLANNED INTERVENTIONS: Therapeutic exercises, Therapeutic activity, Neuromuscular re-education, Patient/Family education, Joint mobilization, Dry Needling, Electrical stimulation, Cryotherapy, Moist heat, Biofeedback, and Manual therapy   PLAN FOR NEXT SESSION:  right SI joint stabilization, manual work as needed  Earlie Counts, PT 05/30/22 4:03 PM

## 2022-06-06 ENCOUNTER — Encounter: Payer: Self-pay | Admitting: Physical Therapy

## 2022-06-06 ENCOUNTER — Encounter: Payer: Medicare Other | Admitting: Physical Therapy

## 2022-06-06 DIAGNOSIS — R278 Other lack of coordination: Secondary | ICD-10-CM

## 2022-06-06 DIAGNOSIS — R252 Cramp and spasm: Secondary | ICD-10-CM

## 2022-06-06 NOTE — Therapy (Signed)
OUTPATIENT PHYSICAL THERAPY TREATMENT NOTE   Patient Name: Cynthia Cochran MRN: [id number] DOB:Feb 24, 1951, 72 y.o., female Today's Date: 06/06/2022  PCP: Celene Squibb, MD  REFERRING PROVIDER: Jaquita Folds, MD    END OF SESSION:   PT End of Session - 06/06/22 1042     Visit Number 4    Date for PT Re-Evaluation 07/13/22    Authorization Type Medicare    Authorization - Visit Number 4    Authorization - Number of Visits 10    PT Start Time 1040    PT Stop Time 1120    PT Time Calculation (min) 40 min    Activity Tolerance Patient tolerated treatment well    Behavior During Therapy [facility] for tasks assessed/performed             Past Medical History:  Diagnosis Date   Anxiety    Arthritis    Blood transfusion without reported diagnosis 1982   after c section   Dysphagia 04/25/2016   Gastric ulcer    GERD (gastroesophageal reflux disease)    Heart murmur    High cholesterol 04/25/2016   Hyperlipidemia    Mixed stress and urge urinary incontinence 09/09/2015   Sciatic nerve pain    Spinal stenosis    Past Surgical History:  Procedure Laterality Date   BIOPSY  05/12/2016   Procedure: BIOPSY;  Surgeon: Rogene Houston, MD;  [facility];  Service: Endoscopy;;  gastric   BIOPSY  10/04/2018   Procedure: BIOPSY;  Surgeon: Rogene Houston, MD;  [facility];  Service: Endoscopy;;  gastric    CARPAL TUNNEL RELEASE     right; 1998, left [place]   COLONOSCOPY WITH PROPOFOL N/A 10/04/2018   Procedure: COLONOSCOPY WITH PROPOFOL;  Surgeon: Rogene Houston, MD;  [facility];  Service: Endoscopy;  Laterality: N/A;  7:30   Colonscopy     ESOPHAGEAL DILATION N/A 05/12/2016   Procedure: ESOPHAGEAL DILATION;  Surgeon: Rogene Houston, MD;  [facility];  Service: Endoscopy;  Laterality: N/A;   ESOPHAGOGASTRODUODENOSCOPY N/A 05/12/2016   Procedure: ESOPHAGOGASTRODUODENOSCOPY (EGD);  Surgeon: Rogene Houston, MD;   [facility];  Service: Endoscopy;  Laterality: N/A;  9:30   ESOPHAGOGASTRODUODENOSCOPY (EGD) WITH PROPOFOL N/A 10/04/2018   Procedure: ESOPHAGOGASTRODUODENOSCOPY (EGD) WITH PROPOFOL;  Surgeon: Rogene Houston, MD;  [facility];  Service: Endoscopy;  Laterality: N/A;   EYE SURGERY     lasix   FLEXIBLE SIGMOIDOSCOPY N/A 01/28/2021   Procedure: FLEXIBLE SIGMOIDOSCOPY WITH PROPOFOL;  Surgeon: Rogene Houston, MD;  [facility];  Service: Endoscopy;  Laterality: N/A;  9:30   JOINT REPLACEMENT     both hips   LUMBAR LAMINECTOMY/DECOMPRESSION MICRODISCECTOMY N/A 08/05/2020   Procedure: LAMINECTOMY THORACIC TEN-ELEVEN;  Surgeon: Ashok Pall, MD;  Location: [place];  Service: Neurosurgery;  Laterality: N/A;   LUMBAR LAMINECTOMY/DECOMPRESSION MICRODISCECTOMY N/A 10/14/2020   Procedure: Right Lumbar 4-5, Lumbar 5 Sacral 1 Lumbar and thoracic laminectomy;  Surgeon: Ashok Pall, MD;  Location: [place];  Service: Neurosurgery;  Laterality: N/A;  Right Lumbar 4-5, Lumbar 5 Sacral 1 Lumbar and thoracic laminectomy   TOTAL HIP ARTHROPLASTY     right; March 2013; left March 2011   Patient Active Problem List   Diagnosis Date Noted   Encounter for counseling 01/09/2022   Pelvic relaxation due to cystocele, midline 04/15/2021   Postmenopausal 04/15/2021   Rectal pain  04/05/2021   Anal fissure 12/21/2020   Constipation 08/12/2020   Thoracic spinal stenosis 08/05/2020   Gait disturbance 11/04/2018   Numbness and tingling 11/04/2018   Lumbar radiculopathy 11/04/2018   Cervical stenosis of spinal canal 11/04/2018   PUD (peptic ulcer disease) 05/10/2018   Anemia 05/10/2018   Special screening for malignant neoplasms, colon 05/10/2018   Abdominal pain 04/07/2018   Hypokalemia 04/07/2018   Osteoarthritis 04/07/2018   Elevated blood pressure reading 04/07/2018   Family hx of colon cancer 02/27/2018   Screening for colorectal cancer 11/06/2017   Encounter for well woman exam  with routine gynecological exam 11/06/2017   High cholesterol 04/25/2016   Dysphagia 04/25/2016   Esophageal dysphagia 04/25/2016   Mixed stress and urge urinary incontinence 09/09/2015   REFERRING DIAG: [id number] (ICD-10-CM) - Levator spasm    THERAPY DIAG:  Cramp and spasm   Other lack of coordination   Rationale for Evaluation and Treatment: Rehabilitation   ONSET DATE: 2021   SUBJECTIVE:                                                                                                                                                                                            SUBJECTIVE STATEMENT: I have had some increased in back pain. I have increased left leg strength.  Patient feels like she is sitting on a tennis ball.      PAIN:  Are you having pain? Yes NPRS scale: 6/10  Pain location: moves from the rectum to the tailbone   Pain type: discomfort Pain description: intermittent    Aggravating factors: sitting, standing; more discomfort with ball feeling in sitting and tightness in standing.  Relieving factors: laying on side with pillow between knees   PRECAUTIONS: None   WEIGHT BEARING RESTRICTIONS: No   FALLS:  Has patient fallen in last 6 months? Yes. Number of falls 01/11/22 in garage   LIVING ENVIRONMENT: Lives with: lives alone     OCCUPATION: retired   PLOF: Independent   PATIENT GOALS: reduce discomfort   PERTINENT HISTORY:  C-section; bil. Hips replaced; lumbar laminectomy    BOWEL MOVEMENT: Pain with bowel movement: No Type of bowel movement:Type (Bristol Stool Scale) Type 6, Frequency daily, and Strain No Fully empty rectum: No, sometimes has to go a second time Leakage: No     URINATION: Pain with urination: No Fully empty bladder: Yes:   Stream: Strong Urgency: Yes: sometimes Frequency: every 2-3 hours Leakage: Sneezing and Laughing Pads: Yes:     INTERCOURSE:not sexually active    PREGNANCY: C-section deliveries C-section  deliveries 1 still born  OBJECTIVE:    COGNITION: Overall cognitive status: Within functional limits for tasks assessed                            POSTURE: posterior pelvic tilt   PELVIC ALIGNMENT:   LUMBARAROM/PROM:   A/PROM A/PROM  eval  Extension Decreased by 50% with pain in right SI area  Left lateral flexion Decreased by 25% with pain across the sacrum   (Blank rows = not tested)   LOWER EXTREMITY [id number] hip ROM is full   05/30/22; Positive slump test on the right.     LOWER EXTREMITY MMT:   MMT Right eval Left eval  Hip extension 4/5 4/5  Hip abduction 4/5 4/5    PALPATION:   General  tenderness located in the left gluteal and piriformis                 External Perineal Exam decreased sensation on the left S3, S4, and S5 dermatome                             Internal Pelvic Floor tenderness located on the left alcock canal, along the puborectalis, and iliococcygeus, difficulty pushing the therapist finger all the way out of the rectum   Patient confirms identification and approves PT to assess internal pelvic floor and treatment Yes   PELVIC MMT:   MMT eval  Internal Anal Sphincter 2/5 for 2 sec  External Anal Sphincter 2/5  Puborectalis 2/5  (Blank rows = not tested)         TONE: average     TODAY'S TREATMENT:    06/06/22 Manual: Internal pelvic floor techniques: No emotional/communication barriers or cognitive limitation. Patient is motivated to learn. Patient understands and agrees with treatment goals and plan. PT explains patient will be examined in standing, sitting, and lying down to see how their muscles and joints work. When they are ready, they will be asked to remove their underwear so PT can examine their perineum. The patient is also given the option of providing their own chaperone as one is not provided in our facility. The patient also has the right and is explained the right to defer or refuse any part of the evaluation or  treatment including the internal exam. With the patient's consent, PT will use one gloved finger to gently assess the muscles of the pelvic floor, seeing how well it contracts and relaxes and if there is muscle symmetry. After, the patient will get dressed and PT and patient will discuss exam findings and plan of care. PT and patient discuss plan of care, schedule, attendance policy and HEP activities.  Going through the rectum working on the puborectalis, internal and external anal sphincter Exercises: Stretches/mobility: Hip hinging with spinal neutral and engaging the abdomen Strengthening: Quadruped press hands into mat and contract the abdominal  Sidely press top hand into ball to contract the abdominals with pelvic floor Contraction of the anus with therapist finger int he rectum with verbal cues to not bear down and just lift the anus Standing with ribs over the pelvis and engage the lower abdominals to reduce the posterior pelvic tilt      05/30/22 Manual: Soft tissue mobilization: Nerve mobilization to right leg Manual stretch of hip rotators in prone Manual work to the right quadratus and right gluteal Manual stretch of the quadriceps in prone Spinal mobilization: PA and rotational  mobilization to L1-L5 Sacral mobilization for right rotation Sideglide to right side fo L3-L5   Exercises: Strengthening: Dead lift without weight working on gluteal length Single leg T exercises with hand on counter 10x each side Sidely hip abduction with ball press    05/11/22 Manual: Trigger Point Dry-Needling  Treatment instructions: Expect mild to moderate muscle soreness. S/S of pneumothorax if dry needled over a lung field, and to seek immediate medical attention should they occur. Patient verbalized understanding of these instructions and education.   Patient Consent Given: Yes Education handout provided: Previously provided Muscles treated: bilateral gluteus miniimus, gluteus medius,  piriformis Electrical stimulation performed: No Parameters: N/A Treatment response/outcome: elongation of muscle and trigger point response   Soft tissue mobilization: To assess for dry needling Manual work to bilateral gluteal and piriformis to elongate after dry needling Manual work to the coccygeus and levator ani externally while patient was prone Spinal mobilization: Mobilization of the left SI joint Neuromuscular re-education: Pelvic floor contraction training: Pelvic floor contraction in prone with tactile cues holding 5 sec [id number] Quick flicks in prone [id number] training: Sitting on a ball and performing diaphragmatic breathing to relax the pelvic floor                                                                                                                            PATIENT EDUCATION: 06/06/22 Education details: Access Code: [email or web address] Person educated: Patient Education method: Consulting civil engineer, Demonstration, Corporate treasurer cues, Verbal cues, and Handouts Education comprehension: verbalized understanding, returned demonstration, verbal cues required, tactile cues required, and needs further education     HOME EXERCISE PROGRAM: 06/06/22 Access Code: [email or web address] URL: [email or web address] Date: 06/06/2022 Prepared by: Earlie Counts  Exercises - Kettlebell Deadlift  - 1 x daily - 3 x weekly - 1 sets - 10 reps - Forward T with Counter Support  - 1 x daily - 3 x weekly - 1 sets - 10 reps - Hip Hinge Rock Back  - 1 x daily - 7 x weekly - 1 sets - 10 reps - Quadruped Transversus Abdominis Bracing  - 1 x daily - 7 x weekly - 1 sets - 10 reps - 5 sec hold - Sidelying Transversus Abdominis Bracing  - 1 x daily - 7 x weekly - 1 sets - 10 reps - 5 sec hold   ASSESSMENT:   CLINICAL IMPRESSION: Patient is a 72 y.o. female who was seen today for physical therapy  treatment for levator ani spasm.  Patient feels like her rectum does not want to push the bowels out. Stools are getting  bigger. Patient has to strain to get the stool out. Patient will bear down when trying to contract the rectum and needs tactile cues to correct this. She will stand with her hips pushed forward and tighten the gluteal to reduce her back pain. She is learning how to engage her abdominals and keep her rib cage over the pelvis to reduce tension in the  gluteals. Patient will benefit from skilled therapy to reduce her pressure so she is able to stand and sit with increased comfort.    OBJECTIVE IMPAIRMENTS: decreased activity tolerance, decreased endurance, decreased strength, increased fascial restrictions, and pain.    ACTIVITY LIMITATIONS: sitting and standing   PARTICIPATION LIMITATIONS: meal prep, cleaning, shopping, and community activity   PERSONAL FACTORS: C-section; bil. Hips replaced; lumbar laminectomy  are also affecting patient's functional outcome.    REHAB POTENTIAL: Good   CLINICAL DECISION MAKING: Evolving/moderate complexity   EVALUATION COMPLEXITY: Moderate     GOALS: Goals reviewed with patient? Yes   SHORT TERM GOALS: Target date: 05/18/22   Patient is independent with initial HEP for stretches, nerve stretch, and diaphragmatic breathing Baseline: Goal status: INITIAL   2.  Patient reports the pressure feeling decreased </= 25%.  Baseline:  Goal status: Met 05/30/22     LONG TERM GOALS: Target date: 07/13/2022   Patient independent with initial HEP for pelvic floor strength and elongation.  Baseline:  Goal status: INITIAL   2.  Patient able to sit for >/= 45 minutes with pressure feeling decreased </= 70%.  Baseline:  Goal status: INITIAL   3.  Patient able to stand for 30 minutes at the store with pressure feeling decreased </= 70%.  Baseline:  Goal status: INITIAL   4.  Patient is able to contract the pelvic floor for 15 seconds with strength of 3/5.  Baseline:  Goal status: INITIAL   5.  Patient is able to push the therapist finger out of the rectum when  bearing down.  Baseline:  Goal status: INITIAL       PLAN:   PT FREQUENCY: 1x/week   PT DURATION: 12 weeks   PLANNED INTERVENTIONS: Therapeutic exercises, Therapeutic activity, Neuromuscular re-education, Patient/Family education, Joint mobilization, Dry Needling, Electrical stimulation, Cryotherapy, Moist heat, Biofeedback, and Manual therapy   PLAN FOR NEXT SESSION:  right SI joint stabilization, manual work as needed, work on abdominal contraction and reducing posterior tilt of pelvis   Earlie Counts, PT 06/06/22 12:35 PM

## 2022-06-13 ENCOUNTER — Encounter: Payer: Self-pay | Admitting: Physical Therapy

## 2022-06-13 ENCOUNTER — Encounter: Payer: Medicare Other | Attending: Obstetrics and Gynecology | Admitting: Physical Therapy

## 2022-06-13 DIAGNOSIS — R252 Cramp and spasm: Secondary | ICD-10-CM | POA: Insufficient documentation

## 2022-06-13 DIAGNOSIS — R278 Other lack of coordination: Secondary | ICD-10-CM

## 2022-06-13 NOTE — Therapy (Signed)
OUTPATIENT PHYSICAL THERAPY TREATMENT NOTE   Patient Name: Cynthia Cochran MRN: [id number] DOB:[birth year], 72 y.o., female Today's Date: 06/13/2022  PCP:  Celene Squibb, MD  REFERRING PROVIDER: Jaquita Folds, MD    END OF SESSION:   PT End of Session - 06/13/22 1035     Visit Number 5    Date for PT Re-Evaluation 07/13/22    Authorization Type Medicare    Authorization - Visit Number 5    Authorization - Number of Visits 10    PT Start Time 1030    PT Stop Time 1120    PT Time Calculation (min) 50 min    Activity Tolerance Patient tolerated treatment well    Behavior During Therapy [facility] for tasks assessed/performed             Past Medical History:  Diagnosis Date   Anxiety    Arthritis    Blood transfusion without reported diagnosis 1982   after c section   Dysphagia 04/25/2016   Gastric ulcer    GERD (gastroesophageal reflux disease)    Heart murmur    High cholesterol 04/25/2016   Hyperlipidemia    Mixed stress and urge urinary incontinence 09/09/2015   Sciatic nerve pain    Spinal stenosis    Past Surgical History:  Procedure Laterality Date   BIOPSY  05/12/2016   Procedure: BIOPSY;  Surgeon: Rogene Houston, MD;  [facility];  Service: Endoscopy;;  gastric   BIOPSY  10/04/2018   Procedure: BIOPSY;  Surgeon: Rogene Houston, MD;  [facility];  Service: Endoscopy;;  gastric    CARPAL TUNNEL RELEASE     right; 1998, left [place]   COLONOSCOPY WITH PROPOFOL N/A 10/04/2018   Procedure: COLONOSCOPY WITH PROPOFOL;  Surgeon: Rogene Houston, MD;  [facility];  Service: Endoscopy;  Laterality: N/A;  7:30   Colonscopy     ESOPHAGEAL DILATION N/A 05/12/2016   Procedure: ESOPHAGEAL DILATION;  Surgeon: Rogene Houston, MD;  [facility];  Service: Endoscopy;  Laterality: N/A;   ESOPHAGOGASTRODUODENOSCOPY N/A 05/12/2016   Procedure: ESOPHAGOGASTRODUODENOSCOPY (EGD);  Surgeon: Rogene Houston, MD;   [facility];  Service: Endoscopy;  Laterality: N/A;  9:30   ESOPHAGOGASTRODUODENOSCOPY (EGD) WITH PROPOFOL N/A 10/04/2018   Procedure: ESOPHAGOGASTRODUODENOSCOPY (EGD) WITH PROPOFOL;  Surgeon: Rogene Houston, MD;  [facility];  Service: Endoscopy;  Laterality: N/A;   EYE SURGERY     lasix   FLEXIBLE SIGMOIDOSCOPY N/A 01/28/2021   Procedure: FLEXIBLE SIGMOIDOSCOPY WITH PROPOFOL;  Surgeon: Rogene Houston, MD;  [facility];  Service: Endoscopy;  Laterality: N/A;  9:30   JOINT REPLACEMENT     both hips   LUMBAR LAMINECTOMY/DECOMPRESSION MICRODISCECTOMY N/A 08/05/2020   Procedure: LAMINECTOMY THORACIC TEN-ELEVEN;  Surgeon: Ashok Pall, MD;  Location: [place];  Service: Neurosurgery;  Laterality: N/A;   LUMBAR LAMINECTOMY/DECOMPRESSION MICRODISCECTOMY N/A 10/14/2020   Procedure: Right Lumbar 4-5, Lumbar 5 Sacral 1 Lumbar and thoracic laminectomy;  Surgeon: Ashok Pall, MD;  Location: [place];  Service: Neurosurgery;  Laterality: N/A;  Right Lumbar 4-5, Lumbar 5 Sacral 1 Lumbar and thoracic laminectomy   TOTAL HIP ARTHROPLASTY     right; March 2013; left March 2011   Patient Active Problem List   Diagnosis Date Noted   Encounter for counseling 01/09/2022   Pelvic relaxation due to cystocele, midline 04/15/2021   Postmenopausal 04/15/2021   Rectal  pain 04/05/2021   Anal fissure 12/21/2020   Constipation 08/12/2020   Thoracic spinal stenosis 08/05/2020   Gait disturbance 11/04/2018   Numbness and tingling 11/04/2018   Lumbar radiculopathy 11/04/2018   Cervical stenosis of spinal canal 11/04/2018   PUD (peptic ulcer disease) 05/10/2018   Anemia 05/10/2018   Special screening for malignant neoplasms, colon 05/10/2018   Abdominal pain 04/07/2018   Hypokalemia 04/07/2018   Osteoarthritis 04/07/2018   Elevated blood pressure reading 04/07/2018   Family hx of colon cancer 02/27/2018   Screening for colorectal cancer 11/06/2017   Encounter for well woman exam  with routine gynecological exam 11/06/2017   High cholesterol 04/25/2016   Dysphagia 04/25/2016   Esophageal dysphagia 04/25/2016   Mixed stress and urge urinary incontinence 09/09/2015   REFERRING DIAG: [id number] (ICD-10-CM) - Levator spasm    THERAPY DIAG:  Cramp and spasm   Other lack of coordination   Rationale for Evaluation and Treatment: Rehabilitation   ONSET DATE: 2021   SUBJECTIVE:                                                                                                                                                                                            SUBJECTIVE STATEMENT: Lower back pressure has gone away. I ffel a hardness along the rectum. I have been using the massage chair. I am enjoying going to the gym. The exercises give me temporary relief.     PAIN:  Are you having pain? Yes NPRS scale: 6/10  Pain location: moves from the rectum to the tailbone   Pain type: discomfort Pain description: intermittent    Aggravating factors: sitting, standing; more discomfort with ball feeling in sitting and tightness in standing.  Relieving factors: laying on side with pillow between knees   PRECAUTIONS: None   WEIGHT BEARING RESTRICTIONS: No   FALLS:  Has patient fallen in last 6 months? Yes. Number of falls 01/11/22 in garage   LIVING ENVIRONMENT: Lives with: lives alone     OCCUPATION: retired   PLOF: Independent   PATIENT GOALS: reduce discomfort   PERTINENT HISTORY:  C-section; bil. Hips replaced; lumbar laminectomy    BOWEL MOVEMENT: Pain with bowel movement: No Type of bowel movement:Type (Bristol Stool Scale) Type 6, Frequency daily, and Strain No Fully empty rectum: No, sometimes has to go a second time Leakage: No     URINATION: Pain with urination: No Fully empty bladder: Yes:   Stream: Strong Urgency: Yes: sometimes Frequency: every 2-3 hours Leakage: Sneezing and Laughing Pads: Yes:     INTERCOURSE:not sexually active     PREGNANCY: C-section deliveries C-section deliveries  1 still born      OBJECTIVE:    COGNITION: Overall cognitive status: Within functional limits for tasks assessed                            POSTURE: posterior pelvic tilt   PELVIC ALIGNMENT:   LUMBARAROM/PROM:   A/PROM A/PROM  eval  Extension Decreased by 50% with pain in right SI area  Left lateral flexion Decreased by 25% with pain across the sacrum   (Blank rows = not tested)   LOWER EXTREMITY [id number] hip ROM is full   05/30/22; Positive slump test on the right.     LOWER EXTREMITY MMT:   MMT Right eval Left eval  Hip extension 4/5 4/5  Hip abduction 4/5 4/5    PALPATION:   General  tenderness located in the left gluteal and piriformis                 External Perineal Exam decreased sensation on the left S3, S4, and S5 dermatome                             Internal Pelvic Floor tenderness located on the left alcock canal, along the puborectalis, and iliococcygeus, difficulty pushing the therapist finger all the way out of the rectum   Patient confirms identification and approves PT to assess internal pelvic floor and treatment Yes   PELVIC MMT:   MMT eval  Internal Anal Sphincter 2/5 for 2 sec  External Anal Sphincter 2/5  Puborectalis 2/5  (Blank rows = not tested)         TONE: average     TODAY'S TREATMENT:    06/13/22 Manual: Soft tissue mobilization: Manual work to the lumbar paraspinals, quadratus and right piriformis Spinal mobilization: Grade 2 PA and rotational mobilization to L1-L5 Exercises: Stretches/mobility: sitting neural tension stretch on the right sciatica Strengthening: Quadruped press hands into mat and contract the abdominal with anal contraction Sidely press top hand into ball to contract the abdominals with pelvic floor, tried to do without pressing ball but she would hold her breath and not activate the lower abdominals Supine press hands into the mat and with  ball squeeze to contract the abdomen and rectum with many VC and tactile cues Supine ball squeeze bil. Shoulder extension and rectal contraction  Bridge with ball squeeze with rectal contraction 15x Standing Pallof with red band and tactile cues to engage the abdomen with reduction of post. Tilt of pelvis Therapeutic activities: Functional strengthening activities: Standing with rib cage over the pelvis to reduce posterior tilt of pelvis Sitting with increased lumbar lordosis 06/06/22 Manual: Internal pelvic floor techniques: No emotional/communication barriers or cognitive limitation. Patient is motivated to learn. Patient understands and agrees with treatment goals and plan. PT explains patient will be examined in standing, sitting, and lying down to see how their muscles and joints work. When they are ready, they will be asked to remove their underwear so PT can examine their perineum. The patient is also given the option of providing their own chaperone as one is not provided in our facility. The patient also has the right and is explained the right to defer or refuse any part of the evaluation or treatment including the internal exam. With the patient's consent, PT will use one gloved finger to gently assess the muscles of the pelvic floor, seeing how well it contracts and  relaxes and if there is muscle symmetry. After, the patient will get dressed and PT and patient will discuss exam findings and plan of care. PT and patient discuss plan of care, schedule, attendance policy and HEP activities.  Going through the rectum working on the puborectalis, internal and external anal sphincter Exercises: Stretches/mobility: Hip hinging with spinal neutral and engaging the abdomen Strengthening: Quadruped press hands into mat and contract the abdominal  Sidely press top hand into ball to contract the abdominals with pelvic floor Contraction of the anus with therapist finger int he rectum with verbal  cues to not bear down and just lift the anus Standing with ribs over the pelvis and engage the lower abdominals to reduce the posterior pelvic tilt      05/30/22 Manual: Soft tissue mobilization: Nerve mobilization to right leg Manual stretch of hip rotators in prone Manual work to the right quadratus and right gluteal Manual stretch of the quadriceps in prone Spinal mobilization: PA and rotational mobilization to L1-L5 Sacral mobilization for right rotation Sideglide to right side fo L3-L5   Exercises: Strengthening: Dead lift without weight working on gluteal length Single leg T exercises with hand on counter 10x each side Sidely hip abduction with ball press                                                                                                                               PATIENT EDUCATION: 06/13/22 Education details: Access Code: [email or web address], posture education Person educated: Patient Education method: Explanation, Demonstration, Tactile cues, Verbal cues, and Handouts Education comprehension: verbalized understanding, returned demonstration, verbal cues required, tactile cues required, and needs further education     HOME EXERCISE PROGRAM: 06/13/22 Access Code: [email or web address] URL: [email or web address] Date: 06/13/2022 Prepared by: Earlie Counts  Exercises - Seated Sciatic Tensioner  - 1 x daily - 7 x weekly - 1 sets - 10 reps  ASSESSMENT:   CLINICAL IMPRESSION: Patient is a 72 y.o. female who was seen today for physical therapy  treatment for levator ani spasm.  Patient has trigger points in the quadratus and right piriformis. She will posteriorly tilt her pelvis so she is hanging on her hips increasing tension of the pelvic floor and piriformis muscles. She is needing many tactile and verbal cues to engage her core correctly and not bulge her abdomen out. Patient will benefit from skilled therapy to reduce her pressure so she is able to stand and sit with  increased comfort.    OBJECTIVE IMPAIRMENTS: decreased activity tolerance, decreased endurance, decreased strength, increased fascial restrictions, and pain.    ACTIVITY LIMITATIONS: sitting and standing   PARTICIPATION LIMITATIONS: meal prep, cleaning, shopping, and community activity   PERSONAL FACTORS: C-section; bil. Hips replaced; lumbar laminectomy  are also affecting patient's functional outcome.    REHAB POTENTIAL: Good   CLINICAL DECISION MAKING: Evolving/moderate complexity   EVALUATION COMPLEXITY: Moderate     GOALS: Goals reviewed with patient? Yes  SHORT TERM GOALS: Target date: 05/18/22   Patient is independent with initial HEP for stretches, nerve stretch, and diaphragmatic breathing Baseline: Goal status: Met 06/13/22   2.  Patient reports the pressure feeling decreased </= 25%.  Baseline:  Goal status: Met 05/30/22     LONG TERM GOALS: Target date: 07/13/2022   Patient independent with initial HEP for pelvic floor strength and elongation.  Baseline:  Goal status: INITIAL   2.  Patient able to sit for >/= 45 minutes with pressure feeling decreased </= 70%.  Baseline:  Goal status: INITIAL   3.  Patient able to stand for 30 minutes at the store with pressure feeling decreased </= 70%.  Baseline:  Goal status: ongoing 06/13/22   4.  Patient is able to contract the pelvic floor for 15 seconds with strength of 3/5.  Baseline:  Goal status: INITIAL   5.  Patient is able to push the therapist finger out of the rectum when bearing down.  Baseline:  Goal status: INITIAL       PLAN:   PT FREQUENCY: 1x/week   PT DURATION: 12 weeks   PLANNED INTERVENTIONS: Therapeutic exercises, Therapeutic activity, Neuromuscular re-education, Patient/Family education, Joint mobilization, Dry Needling, Electrical stimulation, Cryotherapy, Moist heat, Biofeedback, and Manual therapy   PLAN FOR NEXT SESSION:  right SI joint stabilization, manual work as needed on piriformis  and dry needle, work on abdominal contraction and reducing posterior tilt of pelvis  Earlie Counts, PT 06/13/22 11:32 AM

## 2022-06-19 ENCOUNTER — Inpatient Hospital Stay: Payer: Medicare Other | Attending: Hematology

## 2022-06-20 ENCOUNTER — Encounter: Payer: Self-pay | Admitting: Physical Therapy

## 2022-06-20 ENCOUNTER — Encounter: Payer: Medicare Other | Admitting: Physical Therapy

## 2022-06-20 ENCOUNTER — Ambulatory Visit
Admission: EM | Admit: 2022-06-20 | Discharge: 2022-06-20 | Disposition: A | Discharge status: Home / Self Care | Payer: Medicare Other | Attending: Nurse Practitioner | Admitting: Nurse Practitioner

## 2022-06-20 DIAGNOSIS — M5441 Lumbago with sciatica, right side: Secondary | ICD-10-CM

## 2022-06-20 DIAGNOSIS — G8929 Other chronic pain: Secondary | ICD-10-CM | POA: Diagnosis not present

## 2022-06-20 DIAGNOSIS — R252 Cramp and spasm: Secondary | ICD-10-CM

## 2022-06-20 DIAGNOSIS — R278 Other lack of coordination: Secondary | ICD-10-CM

## 2022-06-20 MED ORDER — METHYLPREDNISOLONE SODIUM SUCC 125 MG IJ SOLR
60.0000 mg | Freq: Once | INTRAMUSCULAR | Status: AC
  Administered 2022-06-20: 60 mg via INTRAMUSCULAR

## 2022-06-20 MED ORDER — TIZANIDINE HCL 4 MG PO TABS: 4.0000 mg | ORAL_TABLET | Freq: Three times a day (TID) | ORAL | 0 refills | Status: DC | PRN

## 2022-06-20 MED ORDER — PREDNISONE 20 MG PO TABS: 40.0000 mg | ORAL_TABLET | Freq: Every day | ORAL | 0 refills | Status: AC

## 2022-06-20 NOTE — ED Triage Notes (Signed)
Pt c/o sciatic nerve pain, x 1 week hs gotten worse over the last 3-4 days   Pt has taken tylenol 6-8 pills daily, cannot take NSAIDS.  Tylenol last taken: 8:00 pm

## 2022-06-20 NOTE — ED Provider Notes (Signed)
[facility]    CSN: [id number] Arrival date & time: 06/20/22  0802      History   Chief Complaint Chief Complaint  Patient presents with   Sciatica    HPI Cynthia Cochran is a 72 y.o. female.   Patient presents today for chronic right-sided sciatica.  Reports she sees a Retail buyerneuro surgeon in [place] and has an appointment with them next week.  Reports chronic sciatica began about a week ago and has worse in the past couple of days.  Denies recent fall, trauma, accident, or known injury to her right low back and leg.  Reports currently, her pain is severe and she describes the pain as a constant, sharp and shooting pain down to her shin on the right side.  She denies new numbness or tingling in her legs, saddle anesthesia, new bowel or bladder incontinence, fever, nausea/vomiting, dysuria/urinary frequency, hematuria, weakness, and decreased sensation in lower extremities.  Has been taking Tylenol for the pain which helps minimally.  Reports she is unable to take NSAIDs secondary to history of gastric ulcer.  Reports she gets epidural injections in her back for the pain, last injection was approximately 5 months ago and she has a follow-up with neurosurgeon in 1 week's time.    Past Medical History:  Diagnosis Date   Anxiety    Arthritis    Blood transfusion without reported diagnosis 1982   after c section   Dysphagia 04/25/2016   Gastric ulcer    GERD (gastroesophageal reflux disease)    Heart murmur    High cholesterol 04/25/2016   Hyperlipidemia    Mixed stress and urge urinary incontinence 09/09/2015   Sciatic nerve pain    Spinal stenosis     Patient Active Problem List   Diagnosis Date Noted   Encounter for counseling 01/09/2022   Pelvic relaxation due to cystocele, midline 04/15/2021   Postmenopausal 04/15/2021   Rectal pain 04/05/2021   Anal fissure 12/21/2020   Constipation 08/12/2020   Thoracic spinal stenosis 08/05/2020   Gait disturbance 11/04/2018    Numbness and tingling 11/04/2018   Lumbar radiculopathy 11/04/2018   Cervical stenosis of spinal canal 11/04/2018   PUD (peptic ulcer disease) 05/10/2018   Anemia 05/10/2018   Special screening for malignant neoplasms, colon 05/10/2018   Abdominal pain 04/07/2018   Hypokalemia 04/07/2018   Osteoarthritis 04/07/2018   Elevated blood pressure reading 04/07/2018   Family hx of colon cancer 02/27/2018   Screening for colorectal cancer 11/06/2017   Encounter for well woman exam with routine gynecological exam 11/06/2017   High cholesterol 04/25/2016   Dysphagia 04/25/2016   Esophageal dysphagia 04/25/2016   Mixed stress and urge urinary incontinence 09/09/2015    Past Surgical History:  Procedure Laterality Date   BIOPSY  05/12/2016   Procedure: BIOPSY;  Surgeon: Malissa HippoNajeeb U Rehman, MD;  Location: [facility];  Service: Endoscopy;;  gastric   BIOPSY  10/04/2018   Procedure: BIOPSY;  Surgeon: Malissa Hippoehman, Najeeb U, MD;  Location: [facility];  Service: Endoscopy;;  gastric    CARPAL TUNNEL RELEASE     right; 1998, left 1998   CESAREAN SECTION  1982   COLONOSCOPY WITH PROPOFOL N/A 10/04/2018   Procedure: COLONOSCOPY WITH PROPOFOL;  Surgeon: Malissa Hippoehman, Najeeb U, MD;  Location: [facility];  Service: Endoscopy;  Laterality: N/A;  7:30   Colonscopy     ESOPHAGEAL DILATION N/A 05/12/2016   Procedure: ESOPHAGEAL DILATION;  Surgeon: Malissa HippoNajeeb U Rehman, MD;  Location: [facility];  Service: Endoscopy;  Laterality: N/A;   ESOPHAGOGASTRODUODENOSCOPY N/A 05/12/2016   Procedure: ESOPHAGOGASTRODUODENOSCOPY (EGD);  Surgeon: Malissa Hippo, MD;  [facility]: [facility];  Service: Endoscopy;  Laterality: N/A;  9:30   ESOPHAGOGASTRODUODENOSCOPY (EGD) WITH PROPOFOL N/A 10/04/2018   Procedure: ESOPHAGOGASTRODUODENOSCOPY (EGD) WITH PROPOFOL;  Surgeon: Malissa Hippo, MD;  [facility]: [facility];  Service: Endoscopy;  Laterality: N/A;   EYE SURGERY     lasix   FLEXIBLE SIGMOIDOSCOPY N/A 01/28/2021   Procedure:  FLEXIBLE SIGMOIDOSCOPY WITH PROPOFOL;  Surgeon: Malissa Hippo, MD;  Location: [facility];  Service: Endoscopy;  Laterality: N/A;  9:30   JOINT REPLACEMENT     both hips   LUMBAR LAMINECTOMY/DECOMPRESSION MICRODISCECTOMY N/A 08/05/2020   Procedure: LAMINECTOMY THORACIC TEN-ELEVEN;  Surgeon: Coletta Memos, MD;  Location: [facility];  Service: Neurosurgery;  Laterality: N/A;   LUMBAR LAMINECTOMY/DECOMPRESSION MICRODISCECTOMY N/A 10/14/2020   Procedure: Right Lumbar 4-5, Lumbar 5 Sacral 1 Lumbar and thoracic laminectomy;  Surgeon: Coletta Memos, MD;  Location: [facility];  Service: Neurosurgery;  Laterality: N/A;  Right Lumbar 4-5, Lumbar 5 Sacral 1 Lumbar and thoracic laminectomy   TOTAL HIP ARTHROPLASTY     right; March 2013; left March 2011    OB History     Gravida  3   Para  1   Term      Preterm  1   AB  2   Living  0      SAB  2   IAB      Ectopic      Multiple      Live Births               Home Medications    Prior to Admission medications   Medication Sig Start Date End Date Taking? Authorizing Provider  predniSONE (DELTASONE) 20 MG tablet Take 2 tablets (40 mg total) by mouth daily with breakfast for 5 days. 06/20/22 06/25/22 Yes Valentino Nose, NP  acetaminophen (TYLENOL) 650 MG CR tablet Take 1,300 mg by mouth every 8 (eight) hours as needed for pain.    [provider]  atorvastatin (LIPITOR) 80 MG tablet Take 80 mg by mouth at bedtime.  12/14/11   [provider]  Cholecalciferol (DIALYVITE VITAMIN D 5000) 125 MCG (5000 UT) capsule Take 5,000 Units by mouth daily.    [provider]  COLLAGEN PO Take 1 capsule by mouth daily.    [provider]  HYDROcodone-acetaminophen (NORCO/VICODIN) 5-325 MG tablet Take 1-2 tablets by mouth every 6 (six) hours as needed. 01/12/22   Benjiman Core, MD  losartan (COZAAR) 50 MG tablet Take 1 tablet (50 mg total) by mouth daily. 01/03/22   [name], MD  polyethylene glycol  (MIRALAX / GLYCOLAX) 17 g packet Take 17 g by mouth daily. One capful daily in coffee    [provider]  Psyllium (METAMUCIL PO) Take 1 Dose by mouth daily. 1 dose = 2 teaspoons    [provider]  Specialty Vitamins Products (BIOTIN PLUS KERATIN PO) Take 1 capsule by mouth daily.    [provider]  tiZANidine (ZANAFLEX) 4 MG tablet Take 1 tablet (4 mg total) by mouth every 8 (eight) hours as needed for muscle spasms. Do not take with alcohol or while driving or operating heavy machinery.  May cause drowsiness. 06/20/22   Valentino Nose, NP  Turmeric Curcumin 500 MG CAPS Take 500 mg by mouth daily.    [provider]  Vibegron 75 MG TABS Take 1 tablet by mouth daily. 02/22/22   Marguerita Beards, MD    Family History Family History  Problem Relation Age of Onset   Other Mother        heavy smoker; died from arterial sclerosis   Colon cancer Father 90   Hypertension Sister    Colon cancer Brother 29   Stomach cancer Neg Hx    Breast cancer Neg Hx     Social History Social History   Tobacco Use   Smoking status: Never   Smokeless tobacco: Never  Vaping Use   Vaping Use: Never used  Substance Use Topics   Alcohol use: Yes    Comment: social drinking - a glass of wine about every 2-3 weeka   Drug use: No     Allergies   Lyrica [pregabalin], Mobic [meloxicam], and Nsaids   Review of Systems Review of Systems Per HPI  Physical Exam Triage Vital Signs ED Triage Vitals  Enc Vitals Group     BP 06/20/22 0807 (!) 168/85     Pulse Rate 06/20/22 0807 72     Resp 06/20/22 0807 18     Temp 06/20/22 0807 98.1 F (36.7 C)     Temp Source 06/20/22 0807 Oral     SpO2 06/20/22 0807 96 %     Weight --      Height --      Head Circumference --      Peak Flow --      Pain Score 06/20/22 0810 10     Pain Loc --      Pain Edu? --      Excl. in GC? --    No data found.  Updated Vital Signs BP (!) 168/85 (BP Location: Right Arm)    Pulse 72   Temp 98.1 F (36.7 C) (Oral)   Resp 18   SpO2 96%   Visual Acuity Right Eye Distance:   Left Eye Distance:   Bilateral Distance:    Right Eye Near:   Left Eye Near:    Bilateral Near:     Physical Exam Vitals and nursing note reviewed.  Constitutional:      General: She is not in acute distress.    Appearance: Normal appearance. She is not toxic-appearing.  HENT:     Mouth/Throat:     Mouth: Mucous membranes are moist.     Pharynx: Oropharynx is clear.  Pulmonary:     Effort: Pulmonary effort is normal. No respiratory distress.  Musculoskeletal:     Right ankle: Normal pulse.     Left ankle: Normal pulse.     Right foot: Normal capillary refill. Normal pulse.     Left foot: Normal capillary refill. Normal pulse.       Legs:     Comments: Inspection: No swelling, obvious deformity, or redness to right/upper buttock Palpation: Right buttock tender to palpation in area marked; no obvious deformities palpated ROM: Full ROM to bilateral lower extremities Strength: 5/5 bilateral lower extremities Neurovascular: neurovascularly intact in left and right lower extremity   Skin:    General: Skin is warm and dry.     Capillary Refill: Capillary refill takes less than 2 seconds.     Coloration: Skin is not jaundiced or pale.     Findings: No erythema.  Neurological:     Mental Status: She is alert and oriented to person, place, and time.  Psychiatric:  Behavior: Behavior is cooperative.      UC Treatments / Results  Labs (all labs ordered are listed, but only abnormal results are displayed) Labs Reviewed - No data to display  EKG   Radiology No results found.  Procedures Procedures (including critical care time)  Medications Ordered in UC Medications  methylPREDNISolone sodium succinate (SOLU-MEDROL) 125 mg/2 mL injection 60 mg (60 mg Intramuscular Given 06/20/22 0837)    Initial Impression / Assessment and Plan / UC Course  I have reviewed  the triage vital signs and the nursing notes.  Pertinent labs & imaging results that were available during my care of the patient were reviewed by me and considered in my medical decision making (see chart for details).   Patient is well-appearing, afebrile, not tachycardic, not tachypneic, oxygenating well on room air.  Patient is mildly hypertensive in urgent care today, likely secondary to pain.  1. Chronic right-sided low back pain with right-sided sciatica Will treat with Solu-Medrol 60 mg IM today in urgent care; start oral prednisone as this has previously helped the patient Refill sent in for tizanidine to help relax muscles in the meantime Recommended follow-up with neurosurgeon as planned Strict ER precautions discussed with patient  The patient was given the opportunity to ask questions.  All questions answered to their satisfaction.  The patient is in agreement to this plan.    Final Clinical Impressions(s) / UC Diagnoses   Final diagnoses:  Chronic right-sided low back pain with right-sided sciatica     Discharge Instructions      We have given you a steroid shot today.  You can start the prednisone this morning to help with pain and inflammation.  You can also take the tizanidine every 8 hours as needed for muscular pain.  Continue Tylenol as needed for pain and be sure to keep your appointment next week with your neurosurgeon.  Seek care in emergency room if you develop new urinary incontinence, numbness in between your legs, weakness, decreased sensation, or falls and unable to bear weight on your right leg     ED Prescriptions     Medication Sig Dispense Auth. Provider   predniSONE (DELTASONE) 20 MG tablet Take 2 tablets (40 mg total) by mouth daily with breakfast for 5 days. 10 tablet Cathlean Marseilles A, NP   tiZANidine (ZANAFLEX) 4 MG tablet Take 1 tablet (4 mg total) by mouth every 8 (eight) hours as needed for muscle spasms. Do not take with alcohol or while  driving or operating heavy machinery.  May cause drowsiness. 20 tablet Valentino Nose, NP      PDMP not reviewed this encounter.   Valentino Nose, NP 06/20/22 [phone]

## 2022-06-20 NOTE — Therapy (Signed)
OUTPATIENT PHYSICAL THERAPY TREATMENT NOTE   Patient Name: Cynthia Cochran MRN: [id number] DOB:1951-02-15, 72 y.o., female Today's Date: 06/20/2022  PCP:  Benita Stabile, MD  REFERRING PROVIDER: Marguerita Beards, MD    END OF SESSION:   PT End of Session - 06/20/22 1035     Visit Number 6    Date for PT Re-Evaluation 07/13/22    Authorization Type Medicare    Authorization - Visit Number 6    Authorization - Number of Visits 10    PT Start Time 1030    PT Stop Time 1115    PT Time Calculation (min) 45 min    Activity Tolerance Patient tolerated treatment well    Behavior During Therapy [facility] for tasks assessed/performed             Past Medical History:  Diagnosis Date   Anxiety    Arthritis    Blood transfusion without reported diagnosis 1982   after c section   Dysphagia 04/25/2016   Gastric ulcer    GERD (gastroesophageal reflux disease)    Heart murmur    High cholesterol 04/25/2016   Hyperlipidemia    Mixed stress and urge urinary incontinence 09/09/2015   Sciatic nerve pain    Spinal stenosis    Past Surgical History:  Procedure Laterality Date   BIOPSY  05/12/2016   Procedure: BIOPSY;  Surgeon: Malissa Hippo, MD;  [facility];  Service: Endoscopy;;  gastric   BIOPSY  10/04/2018   Procedure: BIOPSY;  Surgeon: Malissa Hippo, MD;  [facility];  Service: Endoscopy;;  gastric    CARPAL TUNNEL RELEASE     right; 1998, left 1998   CESAREAN SECTION  1982   COLONOSCOPY WITH PROPOFOL N/A 10/04/2018   Procedure: COLONOSCOPY WITH PROPOFOL;  Surgeon: Malissa Hippo, MD;  [facility];  Service: Endoscopy;  Laterality: N/A;  7:30   Colonscopy     ESOPHAGEAL DILATION N/A 05/12/2016   Procedure: ESOPHAGEAL DILATION;  Surgeon: Malissa Hippo, MD;  [facility];  Service: Endoscopy;  Laterality: N/A;   ESOPHAGOGASTRODUODENOSCOPY N/A 05/12/2016   Procedure: ESOPHAGOGASTRODUODENOSCOPY (EGD);  Surgeon: Malissa Hippo, MD;   [facility];  Service: Endoscopy;  Laterality: N/A;  9:30   ESOPHAGOGASTRODUODENOSCOPY (EGD) WITH PROPOFOL N/A 10/04/2018   Procedure: ESOPHAGOGASTRODUODENOSCOPY (EGD) WITH PROPOFOL;  Surgeon: Malissa Hippo, MD;  [facility];  Service: Endoscopy;  Laterality: N/A;   EYE SURGERY     lasix   FLEXIBLE SIGMOIDOSCOPY N/A 01/28/2021   Procedure: FLEXIBLE SIGMOIDOSCOPY WITH PROPOFOL;  Surgeon: Malissa Hippo, MD;  [facility];  Service: Endoscopy;  Laterality: N/A;  9:30   JOINT REPLACEMENT     both hips   LUMBAR LAMINECTOMY/DECOMPRESSION MICRODISCECTOMY N/A 08/05/2020   Procedure: LAMINECTOMY THORACIC TEN-ELEVEN;  Surgeon: Coletta Memos, MD;  Location: [facility];  Service: Neurosurgery;  Laterality: N/A;   LUMBAR LAMINECTOMY/DECOMPRESSION MICRODISCECTOMY N/A 10/14/2020   Procedure: Right Lumbar 4-5, Lumbar 5 Sacral 1 Lumbar and thoracic laminectomy;  Surgeon: Coletta Memos, MD;  Location: [facility];  Service: Neurosurgery;  Laterality: N/A;  Right Lumbar 4-5, Lumbar 5 Sacral 1 Lumbar and thoracic laminectomy   TOTAL HIP ARTHROPLASTY     right; March 2013; left March 2011   Patient Active Problem List   Diagnosis Date Noted   Encounter for counseling 01/09/2022   Pelvic relaxation due to cystocele, midline 04/15/2021   Postmenopausal 04/15/2021   Rectal  pain 04/05/2021   Anal fissure 12/21/2020   Constipation 08/12/2020   Thoracic spinal stenosis 08/05/2020   Gait disturbance 11/04/2018   Numbness and tingling 11/04/2018   Lumbar radiculopathy 11/04/2018   Cervical stenosis of spinal canal 11/04/2018   PUD (peptic ulcer disease) 05/10/2018   Anemia 05/10/2018   Special screening for malignant neoplasms, colon 05/10/2018   Abdominal pain 04/07/2018   Hypokalemia 04/07/2018   Osteoarthritis 04/07/2018   Elevated blood pressure reading 04/07/2018   Family hx of colon cancer 02/27/2018   Screening for colorectal cancer 11/06/2017   Encounter for well woman exam  with routine gynecological exam 11/06/2017   High cholesterol 04/25/2016   Dysphagia 04/25/2016   Esophageal dysphagia 04/25/2016   Mixed stress and urge urinary incontinence 09/09/2015   REFERRING DIAG: E15.830 (ICD-10-CM) - Levator spasm    THERAPY DIAG:  Cramp and spasm   Other lack of coordination   Rationale for Evaluation and Treatment: Rehabilitation   ONSET DATE: 2021   SUBJECTIVE:                                                                                                                                                                                            SUBJECTIVE STATEMENT: I was in urgent care today to get a shot for my sciatica. I have and appt. With my MD next week. I have been doing my exercises. I feel like I am sitting on a ball still. I am practicing on pushing my back into the chair.      PAIN:  Are you having pain? Yes NPRS scale: 8/10  Pain location: moves from the rectum to the tailbone   Pain type: discomfort Pain description: intermittent    Aggravating factors: sitting, standing; more discomfort with ball feeling in sitting and tightness in standing.  Relieving factors: laying on side with pillow between knees   PRECAUTIONS: None   WEIGHT BEARING RESTRICTIONS: No   FALLS:  Has patient fallen in last 6 months? Yes. Number of falls 01/11/22 in garage   LIVING ENVIRONMENT: Lives with: lives alone     OCCUPATION: retired   PLOF: Independent   PATIENT GOALS: reduce discomfort   PERTINENT HISTORY:  C-section; bil. Hips replaced; lumbar laminectomy    BOWEL MOVEMENT: Pain with bowel movement: No Type of bowel movement:Type (Bristol Stool Scale) Type 6, Frequency daily, and Strain No Fully empty rectum: No, sometimes has to go a second time Leakage: No     URINATION: Pain with urination: No Fully empty bladder: Yes:   Stream: Strong Urgency: Yes: sometimes Frequency: every 2-3 hours Leakage: Sneezing and Laughing Pads: Yes:  INTERCOURSE:not sexually active    PREGNANCY: C-section deliveries C-section deliveries 1 still born      OBJECTIVE:    COGNITION: Overall cognitive status: Within functional limits for tasks assessed                            POSTURE: posterior pelvic tilt   PELVIC ALIGNMENT:   LUMBARAROM/PROM:   A/PROM A/PROM  eval  Extension Decreased by 50% with pain in right SI area  Left lateral flexion Decreased by 25% with pain across the sacrum   (Blank rows = not tested)   LOWER EXTREMITY NFA:[id number]:bilateral hip ROM is full   05/30/22; Positive slump test on the right.     LOWER EXTREMITY MMT:   MMT Right eval Left eval  Hip extension 4/5 4/5  Hip abduction 4/5 4/5    PALPATION:   General  tenderness located in the left gluteal and piriformis                 External Perineal Exam decreased sensation on the left S3, S4, and S5 dermatome                             Internal Pelvic Floor tenderness located on the left alcock canal, along the puborectalis, and iliococcygeus, difficulty pushing the therapist finger all the way out of the rectum   Patient confirms identification and approves PT to assess internal pelvic floor and treatment Yes   PELVIC MMT:   MMT eval 06/20/22  Internal Anal Sphincter 2/5 for 2 sec 3/5  External Anal Sphincter 2/5 3/5  Puborectalis 2/5 3/5  (Blank rows = not tested)         TONE: average     TODAY'S TREATMENT:    06/20/22 Manual: Soft tissue mobilization: To assess for dry needling Manual work to the gluteus medius, gluteus maximus and piriformis bil.  Internal pelvic floor techniques: No emotional/communication barriers or cognitive limitation. Patient is motivated to learn. Patient understands and agrees with treatment goals and plan. PT explains patient will be examined in standing, sitting, and lying down to see how their muscles and joints work. When they are ready, they will be asked to remove their underwear so PT can examine  their perineum. The patient is also given the option of providing their own chaperone as one is not provided in our facility. The patient also has the right and is explained the right to defer or refuse any part of the evaluation or treatment including the internal exam. With the patient's consent, PT will use one gloved finger to gently assess the muscles of the pelvic floor, seeing how well it contracts and relaxes and if there is muscle symmetry. After, the patient will get dressed and PT and patient will discuss exam findings and plan of care. PT and patient discuss plan of care, schedule, attendance policy and HEP activities.  Going through the anus working on the piriformis, coccygeus, levator ani and aycock's canal to release the tissue after dry needling Trigger Point Dry-Needling  Treatment instructions: Expect mild to moderate muscle soreness. S/S of pneumothorax if dry needled over a lung field, and to seek immediate medical attention should they occur. Patient verbalized understanding of these instructions and education.  Patient Consent Given: Yes Education handout provided: Yes Muscles treated: bilateral piriformis, gluteus medius, gluteus maximus, coccygeus, and iliococcygeus Electrical stimulation performed: No Parameters: N/A Treatment  response/outcome: elongation of muscles and trigger point response    06/13/22 Manual: Soft tissue mobilization: Manual work to the lumbar paraspinals, quadratus and right piriformis Spinal mobilization: Grade 2 PA and rotational mobilization to L1-L5 Exercises: Stretches/mobility: sitting neural tension stretch on the right sciatica Strengthening: Quadruped press hands into mat and contract the abdominal with anal contraction Sidely press top hand into ball to contract the abdominals with pelvic floor, tried to do without pressing ball but she would hold her breath and not activate the lower abdominals Supine press hands into the mat and with  ball squeeze to contract the abdomen and rectum with many VC and tactile cues Supine ball squeeze bil. Shoulder extension and rectal contraction  Bridge with ball squeeze with rectal contraction 15x Standing Pallof with red band and tactile cues to engage the abdomen with reduction of post. Tilt of pelvis Therapeutic activities: Functional strengthening activities: Standing with rib cage over the pelvis to reduce posterior tilt of pelvis Sitting with increased lumbar lordosis 06/06/22 Manual: Internal pelvic floor techniques: No emotional/communication barriers or cognitive limitation. Patient is motivated to learn. Patient understands and agrees with treatment goals and plan. PT explains patient will be examined in standing, sitting, and lying down to see how their muscles and joints work. When they are ready, they will be asked to remove their underwear so PT can examine their perineum. The patient is also given the option of providing their own chaperone as one is not provided in our facility. The patient also has the right and is explained the right to defer or refuse any part of the evaluation or treatment including the internal exam. With the patient's consent, PT will use one gloved finger to gently assess the muscles of the pelvic floor, seeing how well it contracts and relaxes and if there is muscle symmetry. After, the patient will get dressed and PT and patient will discuss exam findings and plan of care. PT and patient discuss plan of care, schedule, attendance policy and HEP activities.  Going through the rectum working on the puborectalis, internal and external anal sphincter Exercises: Stretches/mobility: Hip hinging with spinal neutral and engaging the abdomen Strengthening: Quadruped press hands into mat and contract the abdominal  Sidely press top hand into ball to contract the abdominals with pelvic floor Contraction of the anus with therapist finger int he rectum with verbal  cues to not bear down and just lift the anus Standing with ribs over the pelvis and engage the lower abdominals to reduce the posterior pelvic tilt                                                                                                                                 PATIENT EDUCATION: 06/13/22 Education details: Access Code: [email or web address], posture education Person educated: Patient Education method: Explanation, Demonstration, Tactile cues, Verbal cues, and Handouts Education comprehension: verbalized understanding, returned demonstration, verbal cues required, tactile cues required, and needs  further education     HOME EXERCISE PROGRAM: 06/13/22 Access Code: [email or web address] URL: [email or web address] Date: 06/13/2022 Prepared by: Eulis Foster   Exercises - Seated Sciatic Tensioner  - 1 x daily - 7 x weekly - 1 sets - 10 reps  ASSESSMENT:   CLINICAL IMPRESSION: Patient is a 72 y.o. female who was seen today for physical therapy  treatment for levator ani spasm.  Patient has numbness around the rectum. She had trigger pints in the muscles today. She had no ball feeling after the dry needling. Pelvic floor strength increased to 3/5. She had an injection today to reduce her sciatica pain.  Patient will benefit from skilled therapy to reduce her pressure so she is able to stand and sit with increased comfort.    OBJECTIVE IMPAIRMENTS: decreased activity tolerance, decreased endurance, decreased strength, increased fascial restrictions, and pain.    ACTIVITY LIMITATIONS: sitting and standing   PARTICIPATION LIMITATIONS: meal prep, cleaning, shopping, and community activity   PERSONAL FACTORS: C-section; bil. Hips replaced; lumbar laminectomy  are also affecting patient's functional outcome.    REHAB POTENTIAL: Good   CLINICAL DECISION MAKING: Evolving/moderate complexity   EVALUATION COMPLEXITY: Moderate     GOALS: Goals reviewed with patient? Yes   SHORT TERM GOALS:  Target date: 05/18/22   Patient is independent with initial HEP for stretches, nerve stretch, and diaphragmatic breathing Baseline: Goal status: Met 06/13/22   2.  Patient reports the pressure feeling decreased </= 25%.  Baseline:  Goal status: Met 05/30/22     LONG TERM GOALS: Target date: 07/13/2022   Patient independent with initial HEP for pelvic floor strength and elongation.  Baseline:  Goal status: INITIAL   2.  Patient able to sit for >/= 45 minutes with pressure feeling decreased </= 70%.  Baseline:  Goal status: INITIAL   3.  Patient able to stand for 30 minutes at the store with pressure feeling decreased </= 70%.  Baseline:  Goal status: ongoing 06/13/22   4.  Patient is able to contract the pelvic floor for 15 seconds with strength of 3/5.  Baseline:  Goal status: INITIAL   5.  Patient is able to push the therapist finger out of the rectum when bearing down.  Baseline:  Goal status: INITIAL       PLAN:   PT FREQUENCY: 1x/week   PT DURATION: 12 weeks   PLANNED INTERVENTIONS: Therapeutic exercises, Therapeutic activity, Neuromuscular re-education, Patient/Family education, Joint mobilization, Dry Needling, Electrical stimulation, Cryotherapy, Moist heat, Biofeedback, and Manual therapy   PLAN FOR NEXT SESSION:  right SI joint stabilization, manual work as needed on piriformis and dry needle, internal work, work on abdominal contraction and reducing posterior tilt of pelvis  Eulis Foster, PT 06/20/22 11:27 AM

## 2022-06-20 NOTE — Progress Notes (Signed)
Request has been submitted for teir reduction through [facility] at [phone]. Teir reduction is pending There is  a 72hr turn around for a recponse  Key: [id number]

## 2022-06-20 NOTE — Discharge Instructions (Signed)
We have given you a steroid shot today.  You can start the prednisone this morning to help with pain and inflammation.  You can also take the tizanidine every 8 hours as needed for muscular pain.  Continue Tylenol as needed for pain and be sure to keep your appointment next week with your neurosurgeon.  Seek care in emergency room if you develop new urinary incontinence, numbness in between your legs, weakness, decreased sensation, or falls and unable to bear weight on your right leg

## 2022-06-26 ENCOUNTER — Ambulatory Visit: Payer: Medicare Other | Admitting: Hematology

## 2022-06-26 NOTE — Progress Notes (Addendum)
APPROVED  PHARMACY NOTIFIED

## 2022-06-27 ENCOUNTER — Encounter: Payer: Self-pay | Admitting: Physical Therapy

## 2022-06-27 ENCOUNTER — Encounter: Payer: Medicare Other | Admitting: Physical Therapy

## 2022-06-27 DIAGNOSIS — R278 Other lack of coordination: Secondary | ICD-10-CM

## 2022-06-27 DIAGNOSIS — R252 Cramp and spasm: Secondary | ICD-10-CM | POA: Diagnosis not present

## 2022-06-27 NOTE — Therapy (Addendum)
OUTPATIENT PHYSICAL THERAPY TREATMENT NOTE   Patient Name: Cynthia Cochran MRN: [id number] DOB:1950-11-13, 72 y.o., female Today's Date: 06/27/2022  PCP: Benita Stabile, MD  REFERRING PROVIDER: Marguerita Beards, MD    END OF SESSION:   PT End of Session - 06/27/22 1039     Visit Number 7    Date for PT Re-Evaluation 07/13/22    Authorization Type Medicare    Authorization - Visit Number 7    Authorization - Number of Visits 10    PT Start Time 1040    PT Stop Time 1120    PT Time Calculation (min) 40 min    Activity Tolerance Patient tolerated treatment well    Behavior During Therapy [facility] for tasks assessed/performed             Past Medical History:  Diagnosis Date   Anxiety    Arthritis    Blood transfusion without reported diagnosis 1982   after c section   Dysphagia 04/25/2016   Gastric ulcer    GERD (gastroesophageal reflux disease)    Heart murmur    High cholesterol 04/25/2016   Hyperlipidemia    Mixed stress and urge urinary incontinence 09/09/2015   Sciatic nerve pain    Spinal stenosis    Past Surgical History:  Procedure Laterality Date   BIOPSY  05/12/2016   Procedure: BIOPSY;  Surgeon: Malissa Hippo, MD;  [facility];  Service: Endoscopy;;  gastric   BIOPSY  10/04/2018   Procedure: BIOPSY;  Surgeon: Malissa Hippo, MD;  [facility];  Service: Endoscopy;;  gastric    CARPAL TUNNEL RELEASE     right; 1998, left 1998   CESAREAN SECTION  1982   COLONOSCOPY WITH PROPOFOL N/A 10/04/2018   Procedure: COLONOSCOPY WITH PROPOFOL;  Surgeon: Malissa Hippo, MD;  [facility];  Service: Endoscopy;  Laterality: N/A;  7:30   Colonscopy     ESOPHAGEAL DILATION N/A 05/12/2016   Procedure: ESOPHAGEAL DILATION;  Surgeon: Malissa Hippo, MD;  [facility];  Service: Endoscopy;  Laterality: N/A;   ESOPHAGOGASTRODUODENOSCOPY N/A 05/12/2016   Procedure: ESOPHAGOGASTRODUODENOSCOPY (EGD);  Surgeon: Malissa Hippo, MD;   [facility];  Service: Endoscopy;  Laterality: N/A;  9:30   ESOPHAGOGASTRODUODENOSCOPY (EGD) WITH PROPOFOL N/A 10/04/2018   Procedure: ESOPHAGOGASTRODUODENOSCOPY (EGD) WITH PROPOFOL;  Surgeon: Malissa Hippo, MD;  [facility];  Service: Endoscopy;  Laterality: N/A;   EYE SURGERY     lasix   FLEXIBLE SIGMOIDOSCOPY N/A 01/28/2021   Procedure: FLEXIBLE SIGMOIDOSCOPY WITH PROPOFOL;  Surgeon: Malissa Hippo, MD;  [facility];  Service: Endoscopy;  Laterality: N/A;  9:30   JOINT REPLACEMENT     both hips   LUMBAR LAMINECTOMY/DECOMPRESSION MICRODISCECTOMY N/A 08/05/2020   Procedure: LAMINECTOMY THORACIC TEN-ELEVEN;  Surgeon: Coletta Memos, MD;  Location: [facility];  Service: Neurosurgery;  Laterality: N/A;   LUMBAR LAMINECTOMY/DECOMPRESSION MICRODISCECTOMY N/A 10/14/2020   Procedure: Right Lumbar 4-5, Lumbar 5 Sacral 1 Lumbar and thoracic laminectomy;  Surgeon: Coletta Memos, MD;  Location: [facility];  Service: Neurosurgery;  Laterality: N/A;  Right Lumbar 4-5, Lumbar 5 Sacral 1 Lumbar and thoracic laminectomy   TOTAL HIP ARTHROPLASTY     right; March 2013; left March 2011   Patient Active Problem List   Diagnosis Date Noted   Encounter for counseling 01/09/2022   Pelvic relaxation due to cystocele, midline 04/15/2021   Postmenopausal 04/15/2021   Rectal pain  04/05/2021   Anal fissure 12/21/2020   Constipation 08/12/2020   Thoracic spinal stenosis 08/05/2020   Gait disturbance 11/04/2018   Numbness and tingling 11/04/2018   Lumbar radiculopathy 11/04/2018   Cervical stenosis of spinal canal 11/04/2018   PUD (peptic ulcer disease) 05/10/2018   Anemia 05/10/2018   Special screening for malignant neoplasms, colon 05/10/2018   Abdominal pain 04/07/2018   Hypokalemia 04/07/2018   Osteoarthritis 04/07/2018   Elevated blood pressure reading 04/07/2018   Family hx of colon cancer 02/27/2018   Screening for colorectal cancer 11/06/2017   Encounter for well woman exam  with routine gynecological exam 11/06/2017   High cholesterol 04/25/2016   Dysphagia 04/25/2016   Esophageal dysphagia 04/25/2016   Mixed stress and urge urinary incontinence 09/09/2015   REFERRING DIAG: U98.119 (ICD-10-CM) - Levator spasm    THERAPY DIAG:  Cramp and spasm   Other lack of coordination   Rationale for Evaluation and Treatment: Rehabilitation   ONSET DATE: 2021   SUBJECTIVE:                                                                                                                                                                                            SUBJECTIVE STATEMENT: My sciatica is hurting me more. I am still going to the gym but not working on the lower half due to the pain. My ball feeling in the rectum now feel like a block. I have numbness in my legs. I am not seeing the neurologist till next week.       PAIN:  Are you having pain? Yes NPRS scale: 8/10 , discomfort level Pain location: moves from the rectum to the tailbone   Pain type: discomfort Pain description: constant   Aggravating factors: sitting, standing; more discomfort with ball feeling in sitting and tightness in standing.  Relieving factors: laying on side with pillow between knees   PRECAUTIONS: None   WEIGHT BEARING RESTRICTIONS: No   FALLS:  Has patient fallen in last 6 months? Yes. Number of falls 01/11/22 in garage   LIVING ENVIRONMENT: Lives with: lives alone     OCCUPATION: retired   PLOF: Independent   PATIENT GOALS: reduce discomfort   PERTINENT HISTORY:  C-section; bil. Hips replaced; lumbar laminectomy    BOWEL MOVEMENT: Pain with bowel movement: No Type of bowel movement:Type (Bristol Stool Scale) Type 6, Frequency daily, and Strain No Fully empty rectum: No, sometimes has to go a second time Leakage: No     URINATION: Pain with urination: No Fully empty bladder: Yes:   Stream: Strong Urgency: Yes: sometimes Frequency: every 2-3 hours Leakage:  Sneezing  and Laughing Pads: Yes:     INTERCOURSE:not sexually active    PREGNANCY: C-section deliveries C-section deliveries 1 still born      OBJECTIVE:    COGNITION: Overall cognitive status: Within functional limits for tasks assessed                            POSTURE: posterior pelvic tilt   PELVIC ALIGNMENT:   LUMBARAROM/PROM:   A/PROM A/PROM  eval  Extension Decreased by 50% with pain in right SI area  Left lateral flexion Decreased by 25% with pain across the sacrum   (Blank rows = not tested)   LOWER EXTREMITY ZOX:[id number] hip ROM is full   05/30/22; Positive slump test on the right.     LOWER EXTREMITY MMT:   MMT Right eval Left eval  Hip extension 4/5 4/5  Hip abduction 4/5 4/5    PALPATION:   General  tenderness located in the left gluteal and piriformis                 External Perineal Exam decreased sensation on the left S3, S4, and S5 dermatome                             Internal Pelvic Floor tenderness located on the left alcock canal, along the puborectalis, and iliococcygeus, difficulty pushing the therapist finger all the way out of the rectum   Patient confirms identification and approves PT to assess internal pelvic floor and treatment Yes   PELVIC MMT:   MMT eval 06/20/22  Internal Anal Sphincter 2/5 for 2 sec 3/5  External Anal Sphincter 2/5 3/5  Puborectalis 2/5 3/5  (Blank rows = not tested)         TONE: average     TODAY'S TREATMENT:    06/27/22 Manual: Soft tissue mobilization: To assess for dry needling Manual work to lumbar paraspinals, gluteals, iliococcygeus, sacrotuberous ligament to elongate after dry needling and to reduce the muscle spasms and pain Trigger Point Dry-Needling  Treatment instructions: Expect mild to moderate muscle soreness. S/S of pneumothorax if dry needled over a lung field, and to seek immediate medical attention should they occur. Patient verbalized understanding of these instructions and  education.  Patient Consent Given: Yes Education handout provided: Previously provided Muscles treated: lumbar paraspinals, gluteus medius, gluteus maximus and piriformis Electrical stimulation performed: No Parameters: N/A Treatment response/outcome: elongation of muscles and trigger point response     06/20/22 Manual: Soft tissue mobilization: To assess for dry needling Manual work to the gluteus medius, gluteus maximus and piriformis bil.  Internal pelvic floor techniques: No emotional/communication barriers or cognitive limitation. Patient is motivated to learn. Patient understands and agrees with treatment goals and plan. PT explains patient will be examined in standing, sitting, and lying down to see how their muscles and joints work. When they are ready, they will be asked to remove their underwear so PT can examine their perineum. The patient is also given the option of providing their own chaperone as one is not provided in our facility. The patient also has the right and is explained the right to defer or refuse any part of the evaluation or treatment including the internal exam. With the patient's consent, PT will use one gloved finger to gently assess the muscles of the pelvic floor, seeing how well it contracts and relaxes and if there is muscle symmetry. After,  the patient will get dressed and PT and patient will discuss exam findings and plan of care. PT and patient discuss plan of care, schedule, attendance policy and HEP activities.  Going through the anus working on the piriformis, coccygeus, levator ani and aycock's canal to release the tissue after dry needling Trigger Point Dry-Needling  Treatment instructions: Expect mild to moderate muscle soreness. S/S of pneumothorax if dry needled over a lung field, and to seek immediate medical attention should they occur. Patient verbalized understanding of these instructions and education.   Patient Consent Given: Yes Education  handout provided: Yes Muscles treated: bilateral piriformis, gluteus medius, gluteus maximus, coccygeus, and iliococcygeus Electrical stimulation performed: No Parameters: N/A Treatment response/outcome: elongation of muscles and trigger point response    06/13/22 Manual: Soft tissue mobilization: Manual work to the lumbar paraspinals, quadratus and right piriformis Spinal mobilization: Grade 2 PA and rotational mobilization to L1-L5 Exercises: Stretches/mobility: sitting neural tension stretch on the right sciatica Strengthening: Quadruped press hands into mat and contract the abdominal with anal contraction Sidely press top hand into ball to contract the abdominals with pelvic floor, tried to do without pressing ball but she would hold her breath and not activate the lower abdominals Supine press hands into the mat and with ball squeeze to contract the abdomen and rectum with many VC and tactile cues Supine ball squeeze bil. Shoulder extension and rectal contraction  Bridge with ball squeeze with rectal contraction 15x Standing Pallof with red band and tactile cues to engage the abdomen with reduction of post. Tilt of pelvis Therapeutic activities: Functional strengthening activities: Standing with rib cage over the pelvis to reduce posterior tilt of pelvis Sitting with increased lumbar lordosis                                                                                                                                 PATIENT EDUCATION: 06/13/22 Education details: Access Code: [email or web address], posture education Person educated: Patient Education method: Explanation, Demonstration, Tactile cues, Verbal cues, and Handouts Education comprehension: verbalized understanding, returned demonstration, verbal cues required, tactile cues required, and needs further education     HOME EXERCISE PROGRAM: 06/13/22 Access Code: [email or web address] URL: [email or web address] Date:  06/13/2022 Prepared by: Eulis Foster   Exercises - Seated Sciatic Tensioner  - 1 x daily - 7 x weekly - 1 sets - 10 reps  ASSESSMENT:   CLINICAL IMPRESSION: Patient is a 72 y.o. female who was seen today for physical therapy  treatment for levator ani spasm. She is having increased pain in her back and increased numbness in her legs. Patient ball feeling in the tailbone area is more of a block feeling. She has constant discomfort.   She had no pain in the right leg after the manual work. She will be seeing her neurologist next week to assess her back and the rectal pain. Patient will benefit from skilled therapy to reduce her pressure so she is  able to stand and sit with increased comfort.    OBJECTIVE IMPAIRMENTS: decreased activity tolerance, decreased endurance, decreased strength, increased fascial restrictions, and pain.    ACTIVITY LIMITATIONS: sitting and standing   PARTICIPATION LIMITATIONS: meal prep, cleaning, shopping, and community activity   PERSONAL FACTORS: C-section; bil. Hips replaced; lumbar laminectomy  are also affecting patient's functional outcome.    REHAB POTENTIAL: Good   CLINICAL DECISION MAKING: Evolving/moderate complexity   EVALUATION COMPLEXITY: Moderate     GOALS: Goals reviewed with patient? Yes   SHORT TERM GOALS: Target date: 05/18/22   Patient is independent with initial HEP for stretches, nerve stretch, and diaphragmatic breathing Baseline: Goal status: Met 06/13/22   2.  Patient reports the pressure feeling decreased </= 25%.  Baseline:  Goal status: Met 05/30/22     LONG TERM GOALS: Target date: 07/13/2022   Patient independent with initial HEP for pelvic floor strength and elongation.  Baseline:  Goal status: ongoing 06/27/22   2.  Patient able to sit for >/= 45 minutes with pressure feeling decreased </= 70%.  Baseline:  Goal status: ongoing 06/27/22   3.  Patient able to stand for 30 minutes at the store with pressure feeling decreased  </= 70%.  Baseline:  Goal status: ongoing 06/13/22   4.  Patient is able to contract the pelvic floor for 15 seconds with strength of 3/5.  Baseline:  Goal status: ongoing 06/17/22   5.  Patient is able to push the therapist finger out of the rectum when bearing down.  Baseline:  Goal status: ongoing 06/27/22       PLAN:   PT FREQUENCY: 1x/week   PT DURATION: 12 weeks   PLANNED INTERVENTIONS: Therapeutic exercises, Therapeutic activity, Neuromuscular re-education, Patient/Family education, Joint mobilization, Dry Needling, Electrical stimulation, Cryotherapy, Moist heat, Biofeedback, and Manual therapy   PLAN FOR NEXT SESSION:  see what neurologist says and Dr. Florian Buff to see if patient is to come back to therapy.  Eulis Foster, PT 06/27/22 11:31 AM   PHYSICAL THERAPY DISCHARGE SUMMARY  Visits from Start of Care: 7  Current functional level related to goals / functional outcomes: See above. Patient did not meet her Long term goals. She started to have increased symptoms down her legs and will be seeing her neurologist.    Remaining deficits: See above.   Education / Equipment: HEP   Patient agrees to discharge. Patient goals were not met. Patient is being discharged due to lack of progress. Thank you for the referral. Eulis Foster, PT 07/12/22 4:12 PM

## 2022-06-29 ENCOUNTER — Ambulatory Visit: Payer: Medicare Other | Admitting: Physician Assistant

## 2022-07-03 NOTE — Progress Notes (Unsigned)
[facility] Return Visit  SUBJECTIVE  History of Present Illness: RYLEEANN URQUIZA is a 72 y.o. female seen in follow-up for . Plan at last visit was consider SNM. Currently on Gemtesa and doing pelvic floor PT.    Reports she has completed Pelvic floor PT after 9 months. She reports she has back pain and leg pain as well and is going to see her neurologist.    The Leslye Peer is working well. She feels like she has some SUI when lifting heavy things but she is not having a lot of leakage.    Past Medical History: Patient  has a past medical history of Anxiety, Arthritis, Blood transfusion without reported diagnosis (1982), Dysphagia (04/25/2016), Gastric ulcer, GERD (gastroesophageal reflux disease), Heart murmur, High cholesterol (04/25/2016), Hyperlipidemia, Mixed stress and urge urinary incontinence (09/09/2015), Sciatic nerve pain, and Spinal stenosis.   Past Surgical History: She  has a past surgical history that includes Cesarean section (1982); Carpal tunnel release; Total hip arthroplasty; Colonscopy; Esophagogastroduodenoscopy (N/A, 05/12/2016); Esophageal dilation (N/A, 05/12/2016); biopsy (05/12/2016); Colonoscopy with propofol (N/A, 10/04/2018); Esophagogastroduodenoscopy (egd) with propofol (N/A, 10/04/2018); biopsy (10/04/2018); Joint replacement; Eye surgery; Lumbar laminectomy/decompression microdiscectomy (N/A, 08/05/2020); Lumbar laminectomy/decompression microdiscectomy (N/A, 10/14/2020); and Flexible sigmoidoscopy (N/A, 01/28/2021).   Medications: She has a current medication list which includes the following prescription(s): acetaminophen, atorvastatin, dialyvite vitamin d 5000, collagen, [START ON 07/06/2022] estradiol, hydrocodone-acetaminophen, losartan, polyethylene glycol, psyllium, specialty vitamins products, tizanidine, turmeric curcumin, and vibegron.   Allergies: Patient is allergic to lyrica [pregabalin], mobic [meloxicam], and nsaids.   Social History: Patient   reports that she has never smoked. She has never used smokeless tobacco. She reports current alcohol use. She reports that she does not use drugs.      OBJECTIVE     Physical Exam: Vitals:   07/04/22 0925 07/04/22 0948  BP: (!) 183/81 (!) 166/97  Pulse: 74 72   Gen: No apparent distress, A&O x 3.  Detailed Urogynecologic Evaluation:  Deferred.   ASSESSMENT AND PLAN    Ms. Vanrossum is a 72 y.o. with:  1. Overactive bladder   2. Levator spasm   3. Perineal pain   4. Vaginal dryness   5. SUI (stress urinary incontinence, female)   6. Elevated blood pressure reading in office without diagnosis of hypertension    Patient feels her urgency is well managed with Gemtesa. At this time she has a prior auth that brings her co-payment of the medication down to 40$ which she reports is acceptable to her.  Patient has completed 9 months of PT with Eulis Foster, had pelvic floor injections x6 weeks, and is still have the sensation of sitting on a ball. She is planning to see her neurologist to request an MRI for concerns this may be related to back pain/sciatic pain.  Patient to follow up with Neurology.  Patient reports vaginal dryness and discomfort. Prescription for Estradiol cream sent to pharmacy. Patient to use nightly for 2 weeks then twice weekly after.  Patient reports some feeling of leakage when she is picking up heavy objects. Will expectantly manage for now. If she would like to pursue options for treatment of this later we will. Of note, patient has elevated blood pressure readings in the office x2. Patient to follow up with PCP regarding elevated BP readings.   Plan to follow up in 1 year or sooner if needed.

## 2022-07-04 ENCOUNTER — Ambulatory Visit: Payer: Medicare Other | Admitting: Obstetrics and Gynecology

## 2022-07-04 ENCOUNTER — Encounter: Payer: Medicare Other | Admitting: Physical Therapy

## 2022-07-04 ENCOUNTER — Encounter: Payer: Self-pay | Admitting: Obstetrics and Gynecology

## 2022-07-04 VITALS — BP 166/97 | HR 72

## 2022-07-04 DIAGNOSIS — M62838 Other muscle spasm: Secondary | ICD-10-CM | POA: Diagnosis not present

## 2022-07-04 DIAGNOSIS — R102 Pelvic and perineal pain: Secondary | ICD-10-CM | POA: Diagnosis not present

## 2022-07-04 DIAGNOSIS — N898 Other specified noninflammatory disorders of vagina: Secondary | ICD-10-CM

## 2022-07-04 DIAGNOSIS — R030 Elevated blood-pressure reading, without diagnosis of hypertension: Secondary | ICD-10-CM

## 2022-07-04 DIAGNOSIS — N3281 Overactive bladder: Secondary | ICD-10-CM

## 2022-07-04 DIAGNOSIS — N393 Stress incontinence (female) (male): Secondary | ICD-10-CM

## 2022-07-04 MED ORDER — ESTRADIOL 0.1 MG/GM VA CREA: 0.5000 g | TOPICAL_CREAM | VAGINAL | 11 refills | Status: DC

## 2022-07-04 NOTE — Patient Instructions (Signed)
For the vaginal estrogen cream use a blueberry sized amount onto the finger nightly for two weeks and then twice a week after. Do not use the aplicator.   Will plan for you to follow up in 1 year or sooner if needed. I hope things go well with the neurologists.

## 2022-08-01 ENCOUNTER — Telehealth: Payer: Self-pay | Admitting: Cardiovascular Disease

## 2022-08-01 NOTE — Telephone Encounter (Signed)
Left message for patient to call back  

## 2022-08-01 NOTE — Telephone Encounter (Signed)
Patient calling in saying that she needs letter from the dr saying its okay for her to do therapy. Please advise

## 2022-08-01 NOTE — Telephone Encounter (Signed)
Patient is returning call.  °

## 2022-08-01 NOTE — Telephone Encounter (Signed)
Patient states that she was referred to physical therapy but since she has elevated blood pressure they need a note from cardiology stating that she is cleared to do PT. She states that she has some pinched nerves and is in a lot of pain causing her BP to be elevated. She is supposed to be getting an epidural tomorrow. She states BP runs around 190/80s. Advised that I would send a message to Dr. Eden Emms and his nurse for advisement on letter for PT and blood pressures.

## 2022-08-01 NOTE — Telephone Encounter (Signed)
Called patient back about message. Patient stated she saw her PCP about her BP and pain. Patient complaining of elevated BP due to pain in her back. Patient is suppose to be getting an epidural tomorrow which should relieve a lot of her pain, has worked in the past. Patient stated her PCP gave her hydrochlorothiazide 12.5 mg to help with BP and swelling in BLE. Patient has been trying to exercise and go to the gym. Patient is also seeing Physical therapy twice a week, but now they need a note from her cardiologist saying it is okay to continue with PT with elevated BPs. Encouraged patient to take the HCTZ and see if her BP trends down. Informed patient that a message would be sent to Dr. Eden Emms to see about a note for clearance for PT.

## 2022-08-08 NOTE — Telephone Encounter (Signed)
Per Dr. Eden Emms okay to have PT. Will fax clearance to number provided. [phone]

## 2022-09-20 ENCOUNTER — Ambulatory Visit
Admission: EM | Admit: 2022-09-20 | Discharge: 2022-09-20 | Disposition: A | Discharge status: Home / Self Care | Payer: Medicare Other | Attending: Nurse Practitioner | Admitting: Nurse Practitioner

## 2022-09-20 DIAGNOSIS — M5441 Lumbago with sciatica, right side: Secondary | ICD-10-CM | POA: Diagnosis present

## 2022-09-20 DIAGNOSIS — G8929 Other chronic pain: Secondary | ICD-10-CM | POA: Diagnosis present

## 2022-09-20 DIAGNOSIS — S39012A Strain of muscle, fascia and tendon of lower back, initial encounter: Secondary | ICD-10-CM | POA: Insufficient documentation

## 2022-09-20 DIAGNOSIS — R109 Unspecified abdominal pain: Secondary | ICD-10-CM | POA: Diagnosis not present

## 2022-09-20 LAB — POCT URINALYSIS DIP (MANUAL ENTRY)
Blood, UA: NEGATIVE
Glucose, UA: NEGATIVE mg/dL
Leukocytes, UA: NEGATIVE
Nitrite, UA: NEGATIVE
Spec Grav, UA: >=1.03 — AB (ref 1.010–1.025)
Urobilinogen, UA: 0.2 E.U./dL
pH, UA: 5 (ref 5.0–8.0)

## 2022-09-20 MED ORDER — PREDNISONE 20 MG PO TABS: 40.0000 mg | ORAL_TABLET | Freq: Every day | ORAL | 0 refills | Status: AC

## 2022-09-20 MED ORDER — TIZANIDINE HCL 4 MG PO TABS: 4.0000 mg | ORAL_TABLET | Freq: Three times a day (TID) | ORAL | 0 refills | Status: DC | PRN

## 2022-09-20 MED ORDER — METHYLPREDNISOLONE SODIUM SUCC 125 MG IJ SOLR
80.0000 mg | Freq: Once | INTRAMUSCULAR | Status: AC
  Administered 2022-09-20: 80 mg via INTRAMUSCULAR

## 2022-09-20 NOTE — Discharge Instructions (Addendum)
The urinalysis does not indicate an obvious urinary tract infection.  I have ordered a urine culture.  If the results of the culture are abnormal, you will be contacted. You were given an injection of Solu-Medrol 80 mg today.  Start prednisone 40 mg on 09/21/2022. Take medication as prescribed.  Please be advised that the muscle relaxer will cause drowsiness.  No driving, operating heavy equipment or machinery, or drinking alcohol while taking the medication. Follow-up with [facility] for appointment for second opinion. Continue using stretching exercises previously provided for sciatica.  Also recommend following up with physical therapy once symptoms improve. Follow-up in immediately in the emergency department if you experience worsening leg pain, become unable to walk, develop loss of bowel or bladder function, or other concerns. Follow-up as needed.

## 2022-09-20 NOTE — ED Triage Notes (Signed)
Pt c/o sciatic pain right sided, pt states she has been physical therapy but feels like she has pulled a muscle. In her back and thinks it may be related to the pain.

## 2022-09-20 NOTE — ED Provider Notes (Signed)
[facility]    CSN: [id number] Arrival date & time: 09/20/22  0804      History   Chief Complaint No chief complaint on file.   HPI Cynthia Cochran is a 72 y.o. female.   The history is provided by the patient.   The patient presents for complaints of right-sided low back pain and right-sided sciatica that have been present for the past 1 to 2 weeks.  Patient states that she had been attending physical therapy for her sciatica, and states that during the session about 2 weeks ago, she felt a pull in her right lower back.  She states since that time, she has had pain with certain movement, and tenderness to the right lower back.  She also states since that time, she has had an exacerbation of her right-sided sciatica.  She denies fever, chills, injury, trauma, numbness, tingling, inability to bear weight, loss of bowel or bladder function, or urinary symptoms.  Patient states she has been using ice and heat and taking Tylenol for pain.  She states that she has seen [facility] and she has been given epidural injections.  She states the last 1 made her really sick.  She states that she is working on getting a second opinion with [facility].  Past Medical History:  Diagnosis Date   Anxiety    Arthritis    Blood transfusion without reported diagnosis 1982   after c section   Dysphagia 04/25/2016   Gastric ulcer    GERD (gastroesophageal reflux disease)    Heart murmur    High cholesterol 04/25/2016   Hyperlipidemia    Mixed stress and urge urinary incontinence 09/09/2015   Sciatic nerve pain    Spinal stenosis     Patient Active Problem List   Diagnosis Date Noted   Encounter for counseling 01/09/2022   Pelvic relaxation due to cystocele, midline 04/15/2021   Postmenopausal 04/15/2021   Rectal pain 04/05/2021   Anal fissure 12/21/2020   Constipation 08/12/2020   Thoracic spinal stenosis 08/05/2020   Gait disturbance 11/04/2018   Numbness and tingling  11/04/2018   Lumbar radiculopathy 11/04/2018   Cervical stenosis of spinal canal 11/04/2018   PUD (peptic ulcer disease) 05/10/2018   Anemia 05/10/2018   Special screening for malignant neoplasms, colon 05/10/2018   Abdominal pain 04/07/2018   Hypokalemia 04/07/2018   Osteoarthritis 04/07/2018   Elevated blood pressure reading 04/07/2018   Family hx of colon cancer 02/27/2018   Screening for colorectal cancer 11/06/2017   Encounter for well woman exam with routine gynecological exam 11/06/2017   High cholesterol 04/25/2016   Dysphagia 04/25/2016   Esophageal dysphagia 04/25/2016   Mixed stress and urge urinary incontinence 09/09/2015    Past Surgical History:  Procedure Laterality Date   BIOPSY  05/12/2016   Procedure: BIOPSY;  Surgeon: Malissa Hippo, MD;  [facility];  Service: Endoscopy;;  gastric   BIOPSY  10/04/2018   Procedure: BIOPSY;  Surgeon: Malissa Hippo, MD;  [facility]: [facility];  Service: Endoscopy;;  gastric    CARPAL TUNNEL RELEASE     right; 1998, left 1998   CESAREAN SECTION  1982   COLONOSCOPY WITH PROPOFOL N/A 10/04/2018   Procedure: COLONOSCOPY WITH PROPOFOL;  Surgeon: Malissa Hippo, MD;  Location: [facility];  Service: Endoscopy;  Laterality: N/A;  7:30   Colonscopy     ESOPHAGEAL DILATION N/A 05/12/2016   Procedure: ESOPHAGEAL DILATION;  Surgeon: Malissa Hippo, MD;  [facility]: [facility];  Service: Endoscopy;  Laterality: N/A;   ESOPHAGOGASTRODUODENOSCOPY N/A 05/12/2016   Procedure: ESOPHAGOGASTRODUODENOSCOPY (EGD);  Surgeon: Malissa Hippo, MD;  [facility]: [facility];  Service: Endoscopy;  Laterality: N/A;  9:30   ESOPHAGOGASTRODUODENOSCOPY (EGD) WITH PROPOFOL N/A 10/04/2018   Procedure: ESOPHAGOGASTRODUODENOSCOPY (EGD) WITH PROPOFOL;  Surgeon: Malissa Hippo, MD;  [facility];  Service: Endoscopy;  Laterality: N/A;   EYE SURGERY     lasix   FLEXIBLE SIGMOIDOSCOPY N/A 01/28/2021   Procedure: FLEXIBLE SIGMOIDOSCOPY  WITH PROPOFOL;  Surgeon: Malissa Hippo, MD;  [facility]: [facility];  Service: Endoscopy;  Laterality: N/A;  9:30   JOINT REPLACEMENT     both hips   LUMBAR LAMINECTOMY/DECOMPRESSION MICRODISCECTOMY N/A 08/05/2020   Procedure: LAMINECTOMY THORACIC TEN-ELEVEN;  Surgeon: Coletta Memos, MD;  Location: [facility];  Service: Neurosurgery;  Laterality: N/A;   LUMBAR LAMINECTOMY/DECOMPRESSION MICRODISCECTOMY N/A 10/14/2020   Procedure: Right Lumbar 4-5, Lumbar 5 Sacral 1 Lumbar and thoracic laminectomy;  Surgeon: Coletta Memos, MD;  Location: [facility];  Service: Neurosurgery;  Laterality: N/A;  Right Lumbar 4-5, Lumbar 5 Sacral 1 Lumbar and thoracic laminectomy   TOTAL HIP ARTHROPLASTY     right; March 2013; left March 2011    OB History     Gravida  3   Para  1   Term      Preterm  1   AB  2   Living  0      SAB  2   IAB      Ectopic      Multiple      Live Births               Home Medications    Prior to Admission medications   Medication Sig Start Date End Date Taking? Authorizing Provider  predniSONE (DELTASONE) 20 MG tablet Take 2 tablets (40 mg total) by mouth daily with breakfast for 5 days. 09/20/22 09/25/22 Yes Laurena Valko-Warren, Sadie Haber, NP  tiZANidine (ZANAFLEX) 4 MG tablet Take 1 tablet (4 mg total) by mouth every 8 (eight) hours as needed for muscle spasms. 09/20/22  Yes Kysean Sweet-Warren, Sadie Haber, NP  acetaminophen (TYLENOL) 650 MG CR tablet Take 1,300 mg by mouth every 8 (eight) hours as needed for pain.    [provider]  atorvastatin (LIPITOR) 80 MG tablet Take 80 mg by mouth at bedtime.  12/14/11   [provider]  Cholecalciferol (DIALYVITE VITAMIN D 5000) 125 MCG (5000 UT) capsule Take 5,000 Units by mouth daily.    [provider]  COLLAGEN PO Take 1 capsule by mouth daily.    [provider]  estradiol (ESTRACE) 0.1 MG/GM vaginal cream Place 0.5 g vaginally 2 (two) times a week. Place 0.5g nightly for two weeks then twice a  week after 07/06/22   Selmer Dominion, NP  gabapentin (NEURONTIN) 300 MG capsule Take 300 mg by mouth 2 (two) times daily. 07/25/22   [provider]  hydrochlorothiazide (MICROZIDE) 12.5 MG capsule Take 12.5 mg by mouth daily. 07/31/22   [provider]  HYDROcodone-acetaminophen (NORCO/VICODIN) 5-325 MG tablet Take 1-2 tablets by mouth every 6 (six) hours as needed. 01/12/22   Benjiman Core, MD  losartan (COZAAR) 50 MG tablet Take 1 tablet (50 mg total) by mouth daily. 01/03/22   [name], MD  polyethylene glycol (MIRALAX / GLYCOLAX) 17 g packet Take 17 g by mouth daily. One capful daily in coffee    [provider]  Psyllium (METAMUCIL PO) Take 1 Dose by mouth daily. 1 dose = 2 teaspoons    [provider]  Specialty Vitamins Products (BIOTIN PLUS KERATIN PO) Take 1 capsule by mouth daily.    [provider]  Turmeric Curcumin 500 MG CAPS Take 500 mg by mouth daily.    [provider]  Vibegron 75 MG TABS Take 1 tablet by mouth daily. 02/22/22   Marguerita Beards, MD    Family History Family History  Problem Relation Age of Onset   Other Mother        heavy smoker; died from arterial sclerosis   Colon cancer Father 75   Hypertension Sister    Colon cancer Brother 3   Stomach cancer Neg Hx    Breast cancer Neg Hx     Social History Social History   Tobacco Use   Smoking status: Never   Smokeless tobacco: Never  Vaping Use   Vaping Use: Never used  Substance Use Topics   Alcohol use: Yes    Comment: social drinking - a glass of wine about every 2-3 weeka   Drug use: No     Allergies   Lyrica [pregabalin], Mobic [meloxicam], and Nsaids   Review of Systems Review of Systems Per HPI  Physical Exam Triage Vital Signs ED Triage Vitals  Enc Vitals Group     BP 09/20/22 0820 138/77     Pulse Rate 09/20/22 0820 84     Resp 09/20/22 0820 13     Temp 09/20/22 0820 98.2 F (36.8 C)     Temp Source  09/20/22 0820 Oral     SpO2 09/20/22 0820 97 %     Weight --      Height --      Head Circumference --      Peak Flow --      Pain Score 09/20/22 0831 10     Pain Loc --      Pain Edu? --      Excl. in GC? --    No data found.  Updated Vital Signs BP 138/77 (BP Location: Right Arm)   Pulse 84   Temp 98.2 F (36.8 C) (Oral)   Resp 13   SpO2 97%   Visual Acuity Right Eye Distance:   Left Eye Distance:   Bilateral Distance:    Right Eye Near:   Left Eye Near:    Bilateral Near:     Physical Exam Vitals and nursing note reviewed.  Constitutional:      General: She is not in acute distress.    Appearance: Normal appearance.  HENT:     Head: Normocephalic.  Eyes:     Extraocular Movements: Extraocular movements intact.     Pupils: Pupils are equal, round, and reactive to light.  Cardiovascular:     Rate and Rhythm: Normal rate and regular rhythm.     Pulses: Normal pulses.     Heart sounds: Normal heart sounds.  Pulmonary:     Effort: Pulmonary effort is normal. No respiratory distress.     Breath sounds: Normal breath sounds. No stridor. No wheezing, rhonchi or rales.  Abdominal:     General: Bowel sounds are normal.     Palpations: Abdomen is soft.     Tenderness: There is no abdominal tenderness. There is no right CVA tenderness or left CVA tenderness.  Musculoskeletal:     Cervical back: Normal range of motion.     Lumbar  back: Spasms and tenderness present. No swelling, edema, deformity or signs of trauma. Decreased range of motion. Negative right straight leg raise test and negative left straight leg raise test.     Right hip: Tenderness present. No deformity or lacerations. Normal range of motion.       Legs:     Comments: Inspection: No swelling, obvious deformity, or redness to right/upper buttock Palpation: Right buttock tender to palpation in area marked; no obvious deformities palpated ROM: Decreased range of motion noted, most likely due to pain in  lower back. Strength: 5/5 bilateral lower extremities Neurovascular: neurovascularly intact in left and right lower extremity     Lymphadenopathy:     Cervical: No cervical adenopathy.  Skin:    General: Skin is warm and dry.  Neurological:     General: No focal deficit present.     Mental Status: She is alert and oriented to person, place, and time.  Psychiatric:        Mood and Affect: Mood normal.        Behavior: Behavior normal.      UC Treatments / Results  Labs (all labs ordered are listed, but only abnormal results are displayed) Labs Reviewed  POCT URINALYSIS DIP (MANUAL ENTRY) - Abnormal; Notable for the following components:      Result Value   Clarity, UA hazy (*)    Bilirubin, UA small (*)    Ketones, POC UA trace (5) (*)    Spec Grav, UA >=1.030 (*)    Protein Ur, POC trace (*)    All other components within normal limits    EKG   Radiology No results found.  Procedures Procedures (including critical care time)  Medications Ordered in UC Medications  methylPREDNISolone sodium succinate (SOLU-MEDROL) 125 mg/2 mL injection 80 mg (has no administration in time range)    Initial Impression / Assessment and Plan / UC Course  I have reviewed the triage vital signs and the nursing notes.  Pertinent labs & imaging results that were available during my care of the patient were reviewed by me and considered in my medical decision making (see chart for details).  The patient is well-appearing, she is in no acute distress, vital signs are stable.   Patient presents for right-sided back pain and right-sided sciatica.  Patient does have a history of chronic sciatica and right-sided low back pain.  Difficult to ascertain if 1 was preceded by the other.  She does have pain with twisting, turning, and movement, which is different from her usual sciatic presentation.  Solu-Medrol 80 mg IM injection was given today.  Discussed same with the patient.  Will start patient  on prednisone 40 mg for 5 days along with tizanidine 4 mg tablets every 8 hours.  Patient advised to take over-the-counter Tylenol as needed for pain or discomfort.  Supportive care recommendations were provided to the patient to include use of ice or heat, and stretching.  Discussed side effects of muscle relaxer with patient.  Continue current sciatica rehab exercises previously provided.  Also follow-up with physical therapy once symptoms have improved.  Patient was given strict indications for ER follow-up.  Patient verbalizes understanding.  All questions were answered.  Patient stable for discharge.   Final Clinical Impressions(s) / UC Diagnoses   Final diagnoses:  Chronic right-sided low back pain with right-sided sciatica  Strain of muscle, fascia and tendon of lower back, initial encounter  Right flank pain     Discharge Instructions  The urinalysis does not indicate an obvious urinary tract infection.  I have ordered a urine culture.  If the results of the culture are abnormal, you will be contacted. You were given an injection of Solu-Medrol 80 mg today.  Start prednisone 40 mg on 09/21/2022. Take medication as prescribed.  Please be advised that the muscle relaxer will cause drowsiness.  No driving, operating heavy equipment or machinery, or drinking alcohol while taking the medication. Follow-up with [facility] for appointment for second opinion. Continue using stretching exercises previously provided for sciatica.  Also recommend following up with physical therapy once symptoms improve. Follow-up in immediately in the emergency department if you experience worsening leg pain, become unable to walk, develop loss of bowel or bladder function, or other concerns. Follow-up as needed.      ED Prescriptions     Medication Sig Dispense Auth. Provider   predniSONE (DELTASONE) 20 MG tablet Take 2 tablets (40 mg total) by mouth daily with breakfast for 5 days. 10 tablet  Margaretta Chittum-Warren, Sadie Haber, NP   tiZANidine (ZANAFLEX) 4 MG tablet Take 1 tablet (4 mg total) by mouth every 8 (eight) hours as needed for muscle spasms. 20 tablet Taryll Reichenberger-Warren, Sadie Haber, NP      PDMP not reviewed this encounter.   Abran Cantor, NP 09/20/22 [id number]

## 2022-09-21 LAB — URINE CULTURE

## 2022-09-23 ENCOUNTER — Telehealth: Payer: Self-pay | Admitting: Emergency Medicine

## 2022-09-23 DIAGNOSIS — R350 Frequency of micturition: Secondary | ICD-10-CM | POA: Insufficient documentation

## 2022-09-23 NOTE — Telephone Encounter (Signed)
Pt presented to [facility] and inquired about urine culture results. Provider consulted and reported pt would need to provide new urine sample. Pt aware, ice water provided. New stickers printed.

## 2022-09-25 LAB — URINE CULTURE: Culture: 20000 — AB

## 2022-10-13 ENCOUNTER — Other Ambulatory Visit (HOSPITAL_COMMUNITY): Payer: Self-pay | Admitting: Family Medicine

## 2022-10-13 DIAGNOSIS — R1011 Right upper quadrant pain: Secondary | ICD-10-CM

## 2022-10-18 ENCOUNTER — Other Ambulatory Visit (HOSPITAL_COMMUNITY): Payer: Self-pay | Admitting: Internal Medicine

## 2022-10-18 DIAGNOSIS — R109 Unspecified abdominal pain: Secondary | ICD-10-CM

## 2022-10-26 ENCOUNTER — Ambulatory Visit
Admission: RE | Admit: 2022-10-26 | Discharge: 2022-10-26 | Disposition: A | Discharge status: Home / Self Care | Payer: Medicare Other | Source: Ambulatory Visit | Attending: Internal Medicine | Admitting: Internal Medicine

## 2022-10-26 DIAGNOSIS — R109 Unspecified abdominal pain: Secondary | ICD-10-CM

## 2022-10-26 MED ORDER — IOPAMIDOL (ISOVUE-300) INJECTION 61%
100.0000 mL | Freq: Once | INTRAVENOUS | Status: AC | PRN
  Administered 2022-10-26: 100 mL via INTRAVENOUS

## 2022-11-01 ENCOUNTER — Other Ambulatory Visit: Payer: Medicare Other

## 2022-12-09 ENCOUNTER — Other Ambulatory Visit: Payer: Self-pay | Admitting: Obstetrics and Gynecology

## 2022-12-09 DIAGNOSIS — N3281 Overactive bladder: Secondary | ICD-10-CM

## 2022-12-15 ENCOUNTER — Ambulatory Visit: Payer: Medicare Other | Admitting: Internal Medicine

## 2022-12-16 ENCOUNTER — Emergency Department (HOSPITAL_BASED_OUTPATIENT_CLINIC_OR_DEPARTMENT_OTHER)
Admission: EM | Admit: 2022-12-16 | Discharge: 2022-12-16 | Disposition: A | Discharge status: Home / Self Care | Payer: Medicare Other | Attending: Emergency Medicine | Admitting: Emergency Medicine

## 2022-12-16 ENCOUNTER — Encounter (HOSPITAL_BASED_OUTPATIENT_CLINIC_OR_DEPARTMENT_OTHER): Payer: Self-pay

## 2022-12-16 DIAGNOSIS — M5416 Radiculopathy, lumbar region: Secondary | ICD-10-CM | POA: Diagnosis not present

## 2022-12-16 DIAGNOSIS — M545 Low back pain, unspecified: Secondary | ICD-10-CM | POA: Diagnosis present

## 2022-12-16 MED ORDER — HYDROMORPHONE HCL 1 MG/ML IJ SOLN
2.0000 mg | Freq: Once | INTRAMUSCULAR | Status: AC
  Administered 2022-12-16: 2 mg via INTRAMUSCULAR
  Filled 2022-12-16: qty 2

## 2022-12-16 MED ORDER — HYDROMORPHONE HCL 2 MG PO TABS: 2.0000 mg | ORAL_TABLET | Freq: Four times a day (QID) | ORAL | 0 refills | Status: DC | PRN

## 2022-12-16 NOTE — ED Notes (Signed)
Pt sitting up in bed, states feels much better.

## 2022-12-16 NOTE — Discharge Instructions (Signed)
Please follow-up with your surgeon this week as scheduled.  We prescribed you a stronger opioid pain medication to help with your severe pain.  This is called Dilaudid.  Take this sparingly and only as needed for severe breakthrough pain.  Do not take this medication within 6 hours of taking Norco, which is another narcotic pain medicine.  Please avoid mixing sedative medications.  Do not mix this Dilaudid medication with alcohol, or muscle relaxers.  Do not drive, or perform dangerous activities after taking his medications.  Long-term use of opioid narcotics have been associated with addiction, dependency, risk of overdose, and worsening chronic pain.  Please use these medications sparingly.

## 2022-12-16 NOTE — ED Provider Notes (Signed)
EMERGENCY DEPARTMENT AT [facility] Provider Note   CSN: [id number] Arrival date & time: 12/16/22  1730     History  Chief Complaint  Patient presents with   Post op back pain    Cynthia Cochran is a 72 y.o. female presented to ED complaining of intractable pain in her lower back and right leg.  Patient underwent a lumbar decompressive surgery at [facility] on 12/08/22 with Dr Raynald Kemp per my review of external records.  She reports no improvement of her chronic back pain following the surgery, in fact feels that it is worsened over the past several days.  She says she has had intractable pain rating down her right hip into her right leg for a few days.  She has called the surgeons office nearly every day, and says that "they just told me to increase my Norco dose from 5 to 10 mg" which she began last night at a higher dose but with no relief.  She says she has a very high tolerance for pain medication.  She is already on tizanidine, Lyrica, and Norco.  She says she has not been able to sleep.  She has pain with bearing weight.  She denies fevers or chills.  She has an appointment coming up in 3 days in the orthopedic office with her surgeon  HPI     Home Medications Prior to Admission medications   Medication Sig Start Date End Date Taking? Authorizing Provider  HYDROmorphone (DILAUDID) 2 MG tablet Take 1 tablet (2 mg total) by mouth every 6 (six) hours as needed for up to 12 doses for severe pain. 12/16/22  Yes Terald Sleeper, MD  acetaminophen (TYLENOL) 650 MG CR tablet Take 1,300 mg by mouth every 8 (eight) hours as needed for pain.    [provider]  atorvastatin (LIPITOR) 80 MG tablet Take 80 mg by mouth at bedtime.  12/14/11   [provider]  Cholecalciferol (DIALYVITE VITAMIN D 5000) 125 MCG (5000 UT) capsule Take 5,000 Units by mouth daily.    [provider]  COLLAGEN PO Take 1 capsule by mouth daily.    [provider]   estradiol (ESTRACE) 0.1 MG/GM vaginal cream Place 0.5 g vaginally 2 (two) times a week. Place 0.5g nightly for two weeks then twice a week after 07/06/22   Selmer Dominion, NP  gabapentin (NEURONTIN) 300 MG capsule Take 300 mg by mouth 2 (two) times daily. 07/25/22   [provider]  hydrochlorothiazide (MICROZIDE) 12.5 MG capsule Take 12.5 mg by mouth daily. 07/31/22   [provider]  HYDROcodone-acetaminophen (NORCO/VICODIN) 5-325 MG tablet Take 1-2 tablets by mouth every 6 (six) hours as needed. 01/12/22   Benjiman Core, MD  losartan (COZAAR) 50 MG tablet Take 1 tablet (50 mg total) by mouth daily. 01/03/22   [name], MD  polyethylene glycol (MIRALAX / GLYCOLAX) 17 g packet Take 17 g by mouth daily. One capful daily in coffee    [provider]  Psyllium (METAMUCIL PO) Take 1 Dose by mouth daily. 1 dose = 2 teaspoons    [provider]  Specialty Vitamins Products (BIOTIN PLUS KERATIN PO) Take 1 capsule by mouth daily.    [provider]  tiZANidine (ZANAFLEX) 4 MG tablet Take 1 tablet (4 mg total) by mouth every 8 (eight) hours as needed for muscle spasms. 09/20/22   Leath-Warren, Sadie Haber, NP  Turmeric Curcumin 500 MG CAPS Take 500 mg by mouth  daily.    [provider]  Vibegron (GEMTESA) 75 MG TABS Take 1 tablet by mouth once daily 12/11/22   Marguerita Beards, MD      Allergies    Lyrica [pregabalin], Mobic [meloxicam], and Nsaids    Review of Systems   Review of Systems  Physical Exam Updated Vital Signs BP (!) 168/70   Pulse (!) 59   Temp 97.9 F (36.6 C)   Resp 18   SpO2 100%  Physical Exam Constitutional:      General: She is not in acute distress. HENT:     Head: Normocephalic and atraumatic.  Eyes:     Conjunctiva/sclera: Conjunctivae normal.     Pupils: Pupils are equal, round, and reactive to light.  Cardiovascular:     Rate and Rhythm: Normal rate and regular rhythm.  Pulmonary:     Effort:  Pulmonary effort is normal. No respiratory distress.  Abdominal:     General: There is no distension.     Tenderness: There is no abdominal tenderness.  Skin:    General: Skin is warm and dry.     Comments: Incision site from operation appears clean, nonerythematous, intact  Neurological:     General: No focal deficit present.     Mental Status: She is alert and oriented to person, place, and time. Mental status is at baseline.     Sensory: No sensory deficit.     Motor: No weakness.  Psychiatric:        Mood and Affect: Mood normal.        Behavior: Behavior normal.     ED Results / Procedures / Treatments   Labs (all labs ordered are listed, but only abnormal results are displayed) Labs Reviewed - No data to display  EKG None  Radiology No results found.  Procedures Procedures    Medications Ordered in ED Medications  HYDROmorphone (DILAUDID) injection 2 mg (2 mg Intramuscular Given 12/16/22 1954)    ED Course/ Medical Decision Making/ A&P Clinical Course as of 12/16/22 2159  Sat Dec 16, 2022  2157 Patient has significant improvement of her pain after injection.  Her daughter is now here with her husband to help take her home.  Will prescribe a short course of oral Dilaudid for the next 2 to 3 days until the patient can see her orthopedic surgeon.  We carefully discussed her pain control regimen at home to avoid mixing narcotics or other sedative medicines that may depress respirations.  She verbalized understanding [MT]    Clinical Course User Index [MT] Meyah Corle, Kermit Balo, MD                                 Medical Decision Making Risk Prescription drug management.   This patient presents to the ED with concern for low back pain, leg pain. This involves an extensive number of treatment options, and is a complaint that carries with it a high risk of complications and morbidity.  The differential diagnosis includes radiculopathy most likely given this  description.  Less likely infection, DVT, vascular emergency  Co-morbidities that complicate the patient evaluation: Recent surgery  External records from outside source obtained and reviewed including operative records from [facility] in September  This appears to be an issue with intractable pain or poor pain control.  Unfortunately I suspect the patient has a high tolerance to pain medications given her chronic prescriptions, also multiple  modalities of pain medicines being prescribed.  But also is unfortunate that she has not been able to be seen by her surgeon and is not able to sleep with intractable pain at home.  We discussed IM Dilaudid which may assist with better pain control.  She is not a candidate for NSAIDs due to issue with ulcerations or GI bleed, and is already taking Tylenol at hide regular doses at home.  She is also on Lyrica.  I do not see indication for an emergent repeat MRI.  I have a low suspicion for postoperative infection or cauda equina syndrome.   Dispostion:  After consideration of the diagnostic results and the patients response to treatment, I feel that the patent would benefit from outpatient specialist follow-up.         Final Clinical Impression(s) / ED Diagnoses Final diagnoses:  Lumbar radiculopathy    Rx / DC Orders ED Discharge Orders          Ordered    HYDROmorphone (DILAUDID) 2 MG tablet  Every 6 hours PRN       Note to Pharmacy: PDMP prescriptions reviewed - opioid pain regimen discussed with patient   12/16/22 2158              Terald Sleeper, MD 12/16/22 2159

## 2022-12-18 ENCOUNTER — Other Ambulatory Visit: Payer: Self-pay

## 2022-12-18 ENCOUNTER — Emergency Department (HOSPITAL_BASED_OUTPATIENT_CLINIC_OR_DEPARTMENT_OTHER)
Admission: EM | Admit: 2022-12-18 | Discharge: 2022-12-18 | Discharge status: Against Medical Advice | Payer: Medicare Other | Attending: Emergency Medicine | Admitting: Emergency Medicine

## 2022-12-18 ENCOUNTER — Encounter (HOSPITAL_BASED_OUTPATIENT_CLINIC_OR_DEPARTMENT_OTHER): Payer: Self-pay | Admitting: Emergency Medicine

## 2022-12-18 ENCOUNTER — Emergency Department (HOSPITAL_BASED_OUTPATIENT_CLINIC_OR_DEPARTMENT_OTHER): Payer: Medicare Other

## 2022-12-18 DIAGNOSIS — M79662 Pain in left lower leg: Secondary | ICD-10-CM | POA: Diagnosis present

## 2022-12-18 DIAGNOSIS — Z5321 Procedure and treatment not carried out due to patient leaving prior to being seen by health care provider: Secondary | ICD-10-CM | POA: Insufficient documentation

## 2022-12-18 NOTE — ED Notes (Signed)
Pt told staff they needed to leave for another appointment. Pt left ED.

## 2022-12-18 NOTE — ED Triage Notes (Signed)
Pt c/o LLE calf pain, recent back surgery. Pt reports "needing to r/o dvt before MRI

## 2022-12-20 NOTE — Progress Notes (Signed)
CARDIOLOGY CONSULT NOTE       Patient ID: Cynthia Cochran MRN: [id number] DOB/AGE: 72-Apr-1952 72 y.o.  Admit date: (Not on file) Referring Physician: Margo Aye Primary Physician: Georgianne Fick, MD Primary Cardiologist: new Reason for Consultation: Murmur   HPI:  72 y.o. referred by Dr Margo Aye for murmur. I use to take care of her husband Baldo Ash who passed a few years ago. Transition to being single has been rough She was on Lexapro and Prosac but weaned and now on Pristiq for depression TTE done 11/29/21 showed normal EF and just mild AS/AR with mean gradient 13 peak 27.7 mmHg DVI 0.58 and AVA 1.7 cm2 Calcium score was 257 , 83 rd percentile for age/sex. 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 [place] passed   Chronic back pain on codeine,flexeril and lyrica Had repeat surgery at [facility] earlier this month with Out significant relief   ROS All other systems reviewed and negative except as noted above  Past Medical History:  Diagnosis Date   Anxiety    Arthritis    Blood transfusion without reported diagnosis 1982   after c section   Dysphagia 04/25/2016   Gastric ulcer    GERD (gastroesophageal reflux disease)    Heart murmur    High cholesterol 04/25/2016   Hyperlipidemia    Mixed stress and urge urinary incontinence 09/09/2015   Sciatic nerve pain    Spinal stenosis     Family History  Problem Relation Age of Onset   Other Mother        heavy smoker; died from arterial sclerosis   Colon cancer Father 67   Hypertension Sister    Colon cancer Brother 72   Stomach cancer Neg Hx    Breast cancer Neg Hx     Social History    Socioeconomic History   Marital status: Widowed    Spouse name: Not on file   Number of children: 0   Years of education: some college   Highest education level: Not on file  Occupational History   Occupation: Retired  Tobacco Use   Smoking status: Never   Smokeless tobacco: Never  Vaping Use   Vaping status: Never Used  Substance and Sexual Activity   Alcohol use: Yes    Comment: social drinking - a glass of wine about every 2-3 weeka   Drug use: No   Sexual activity: Not Currently    Birth control/protection: Post-menopausal, Abstinence  Other Topics Concern   Not on file  Social History Narrative   Lives alone   Caffeine use: no more than 2 cups per day    16 oz soda per day   Right handed    Social Determinants of Health   Financial Resource Strain: Low Risk  (04/15/2021)   Overall Financial Resource Strain (CARDIA)    Difficulty of Paying Living Expenses: Not hard at all  Food Insecurity: No Food Insecurity (04/15/2021)   Hunger Vital Sign    Worried About Running Out of Food in the Last Year: Never true    Ran Out of Food in the Last Year: Never true  Transportation Needs: No Transportation  Needs (04/15/2021)   PRAPARE - Administrator, Civil Service (Medical): No    Lack of Transportation (Non-Medical): No  Physical Activity: Insufficiently Active (04/15/2021)   Exercise Vital Sign    Days of Exercise per Week: 3 days    Minutes of Exercise per Session: 20 min  Stress: No Stress Concern Present (04/15/2021)   [employer] of Occupational Health - Occupational Stress Questionnaire    Feeling of Stress : Not at all  Social Connections: Unknown (07/26/2021)   Received from [facility]   Social Network    Social Network: Not on file  Intimate Partner Violence: Unknown (06/16/2021)   Received from [facility], [facility]   HITS    Physically Hurt: Not on file    Insult or Talk Down To: Not on file    Threaten Physical Harm: Not on  file    Scream or Curse: Not on file    Past Surgical History:  Procedure Laterality Date   BIOPSY  05/12/2016   Procedure: BIOPSY;  Surgeon: Malissa Hippo, MD;  [facility];  Service: Endoscopy;;  gastric   BIOPSY  10/04/2018   Procedure: BIOPSY;  Surgeon: Malissa Hippo, MD;  [facility];  Service: Endoscopy;;  gastric    CARPAL TUNNEL RELEASE     right; 1998, left 1998   CESAREAN SECTION  1982   COLONOSCOPY WITH PROPOFOL N/A 10/04/2018   Procedure: COLONOSCOPY WITH PROPOFOL;  Surgeon: Malissa Hippo, MD;  [facility];  Service: Endoscopy;  Laterality: N/A;  7:30   Colonscopy     ESOPHAGEAL DILATION N/A 05/12/2016   Procedure: ESOPHAGEAL DILATION;  Surgeon: Malissa Hippo, MD;  [facility];  Service: Endoscopy;  Laterality: N/A;   ESOPHAGOGASTRODUODENOSCOPY N/A 05/12/2016   Procedure: ESOPHAGOGASTRODUODENOSCOPY (EGD);  Surgeon: Malissa Hippo, MD;  [facility];  Service: Endoscopy;  Laterality: N/A;  9:30   ESOPHAGOGASTRODUODENOSCOPY (EGD) WITH PROPOFOL N/A 10/04/2018   Procedure: ESOPHAGOGASTRODUODENOSCOPY (EGD) WITH PROPOFOL;  Surgeon: Malissa Hippo, MD;  [facility];  Service: Endoscopy;  Laterality: N/A;   EYE SURGERY     lasix   FLEXIBLE SIGMOIDOSCOPY N/A 01/28/2021   Procedure: FLEXIBLE SIGMOIDOSCOPY WITH PROPOFOL;  Surgeon: Malissa Hippo, MD;  [facility];  Service: Endoscopy;  Laterality: N/A;  9:30   JOINT REPLACEMENT     both hips   LUMBAR LAMINECTOMY/DECOMPRESSION MICRODISCECTOMY N/A 08/05/2020   Procedure: LAMINECTOMY THORACIC TEN-ELEVEN;  Surgeon: Coletta Memos, MD;  Location: [facility];  Service: Neurosurgery;  Laterality: N/A;   LUMBAR LAMINECTOMY/DECOMPRESSION MICRODISCECTOMY N/A 10/14/2020   Procedure: Right Lumbar 4-5, Lumbar 5 Sacral 1 Lumbar and thoracic laminectomy;  Surgeon: Coletta Memos, MD;  Location: [facility];  Service: Neurosurgery;  Laterality: N/A;  Right Lumbar 4-5, Lumbar 5  Sacral 1 Lumbar and thoracic laminectomy   TOTAL HIP ARTHROPLASTY     right; March 2013; left March 2011      Current Outpatient Medications:    acetaminophen (TYLENOL) 650 MG CR tablet, Take 1,300 mg by mouth every 8 (eight) hours as needed for pain., Disp: , Rfl:    atorvastatin (LIPITOR) 80 MG tablet, Take 80 mg by mouth at bedtime. , Disp: , Rfl:    estradiol (ESTRACE) 0.1 MG/GM vaginal cream, Place 0.5 g vaginally 2 (two) times a week. Place 0.5g nightly for two weeks then twice a week after, Disp: 42.5 g, Rfl: 11   ezetimibe (  ZETIA) 10 MG tablet, Take 10 mg by mouth daily., Disp: , Rfl:    hydrochlorothiazide (MICROZIDE) 12.5 MG capsule, Take 12.5 mg by mouth daily., Disp: , Rfl:    losartan (COZAAR) 100 MG tablet, Take 100 mg by mouth daily., Disp: , Rfl:    methocarbamol (ROBAXIN) 500 MG tablet, Take 500 mg by mouth every 6 (six) hours as needed for muscle spasms., Disp: , Rfl:    polyethylene glycol (MIRALAX / GLYCOLAX) 17 g packet, Take 17 g by mouth daily. One capful daily in coffee, Disp: , Rfl:    pregabalin (LYRICA) 100 MG capsule, Take 100 mg by mouth 3 (three) times daily., Disp: , Rfl:    Psyllium (METAMUCIL PO), Take 1 Dose by mouth daily. 1 dose = 2 teaspoons, Disp: , Rfl:    Vibegron (GEMTESA) 75 MG TABS, Take 1 tablet by mouth once daily, Disp: 30 tablet, Rfl: 11   Cholecalciferol (DIALYVITE VITAMIN D 5000) 125 MCG (5000 UT) capsule, Take 5,000 Units by mouth daily., Disp: , Rfl:    COLLAGEN PO, Take 1 capsule by mouth daily., Disp: , Rfl:    gabapentin (NEURONTIN) 300 MG capsule, Take 300 mg by mouth 2 (two) times daily., Disp: , Rfl:    HYDROcodone-acetaminophen (NORCO/VICODIN) 5-325 MG tablet, Take 1-2 tablets by mouth every 6 (six) hours as needed., Disp: 6 tablet, Rfl: 0   HYDROmorphone (DILAUDID) 2 MG tablet, Take 1 tablet (2 mg total) by mouth every 6 (six) hours as needed for up to 12 doses for severe pain. (Patient not taking: Reported on 01/01/2023), Disp: 12  tablet, Rfl: 0   Specialty Vitamins Products (BIOTIN PLUS KERATIN PO), Take 1 capsule by mouth daily., Disp: , Rfl:    tiZANidine (ZANAFLEX) 4 MG tablet, Take 1 tablet (4 mg total) by mouth every 8 (eight) hours as needed for muscle spasms., Disp: 20 tablet, Rfl: 0   Turmeric Curcumin 500 MG CAPS, Take 500 mg by mouth daily., Disp: , Rfl:     Physical Exam: Blood pressure (!) 170/90, pulse 64, height 5\' 2"  (1.575 m), weight 151 lb (68.5 kg), SpO2 99%.   Home BP readings ok being followed by primary  Affect appropriate Healthy:  appears stated age HEENT: normal Neck supple with no adenopathy JVP normal no bruits no thyromegaly Lungs clear with no wheezing and good diaphragmatic motion Heart:  S1/S2 mild AS/AR murmur, no rub, gallop or click PMI normal Abdomen: benighn, BS positve, no tenderness, no AAA no bruit.  No HSM or HJR Distal pulses intact with no bruits No edema Neuro non-focal Skin warm and dry Post bilateral THR   Labs:   Lab Results  Component Value Date   WBC 6.7 12/14/2021   HGB 12.2 12/14/2021   HCT 37.8 12/14/2021   MCV 86.9 12/14/2021   PLT 307 12/14/2021    No results for input(s): "NA", "K", "CL", "CO2", "BUN", "CREATININE", "CALCIUM", "PROT", "BILITOT", "ALKPHOS", "ALT", "AST", "GLUCOSE" in the last 168 hours.  Invalid input(s): "LABALBU"  Lab Results  Component Value Date   TROPONINI <0.03 04/08/2018    Lab Results  Component Value Date   CHOL 209 (H) 01/02/2022   Lab Results  Component Value Date   HDL 43 01/02/2022   Lab Results  Component Value Date   LDLCALC 131 (H) 01/02/2022   Lab Results  Component Value Date   TRIG 196 (H) 01/02/2022   Lab Results  Component Value Date   CHOLHDL 4.9 (H) 01/02/2022   No  results found for: "LDLDIRECT"    Radiology: US Venous Img Lower Right (DVT Study)  Result Date: 12/18/2022 CLINICAL DATA:  Right lower extremity pain and swelling EXAM: RIGHT LOWER EXTREMITY VENOUS DOPPLER ULTRASOUND  TECHNIQUE: Gray-scale sonography with compression, as well as color and duplex ultrasound, were performed to evaluate the deep venous system(s) from the level of the common femoral vein through the popliteal and proximal calf veins. COMPARISON:  None available FINDINGS: VENOUS Normal compressibility of the common femoral, superficial femoral, and popliteal veins, as well as the visualized calf veins. Visualized portions of profunda femoral vein and great saphenous vein unremarkable. No filling defects to suggest DVT on grayscale or color Doppler imaging. Doppler waveforms show normal direction of venous flow, normal respiratory plasticity and response to augmentation. Limited views of the contralateral common femoral vein are unremarkable. OTHER None. Limitations: none IMPRESSION: No right lower extremity DVT. Electronically Signed   By: Acquanetta Belling M.D.   On: 12/18/2022 13:14    EKG: 01/03/22 SR rate 62 low voltage normal   ASSESSMENT AND PLAN:   CAD: Risk high calcium score for age no chest pain normal ECG continue statin Labs with primary Bruising:  UE;s f/u oncology labs normal meds for neuropathy and depression adjusted Depression: since husbands death now on Pristiq AS: mild with mild AR f/u echo  ordered  HLD:  LDL 131 Triglycerides 196 69/62/95 Zetia added to high dose lipitor f/u labs  and refer to lipid clinic May need to change to PSK9 HTN:  worse when her back hurts continue Microzide/Losartan home readings ok  Sciatica:  right sided PT Duplex negative for DVT on right 12/18/22 Repeat surgery at [facility] not successful   Lipid/Liver   F/U lipid clinic  Echo for AS  F/U primary HTN consider adding Norvasc if home readings elevated  F/U in a year   Signed: Charlton Haws 01/01/2023, 11:08 AM

## 2023-01-01 ENCOUNTER — Ambulatory Visit: Payer: Medicare Other | Attending: Cardiovascular Disease | Admitting: Cardiovascular Disease

## 2023-01-01 ENCOUNTER — Encounter: Payer: Self-pay | Admitting: Cardiovascular Disease

## 2023-01-01 VITALS — BP 164/86 | HR 64 | Ht 62.0 in | Wt 151.0 lb

## 2023-01-01 DIAGNOSIS — I10 Essential (primary) hypertension: Secondary | ICD-10-CM | POA: Diagnosis not present

## 2023-01-01 DIAGNOSIS — I350 Nonrheumatic aortic (valve) stenosis: Secondary | ICD-10-CM | POA: Diagnosis not present

## 2023-01-01 DIAGNOSIS — E782 Mixed hyperlipidemia: Secondary | ICD-10-CM

## 2023-01-01 DIAGNOSIS — R931 Abnormal findings on diagnostic imaging of heart and coronary circulation: Secondary | ICD-10-CM | POA: Diagnosis not present

## 2023-01-01 NOTE — Patient Instructions (Addendum)
Medication Instructions:  Your physician recommends that you continue on your current medications as directed. Please refer to the Current Medication list given to you today.  *If you need a refill on your cardiac medications before your next appointment, please call your pharmacy*   Lab Work: Lipid Panel Liver Function  If you have labs (blood work) drawn today and your tests are completely normal, you will receive your results only by: MyChart Message (if you have MyChart) OR A paper copy in the mail If you have any lab test that is abnormal or we need to change your treatment, we will call you to review the results.   Testing/Procedures: Your physician has requested that you have an echocardiogram. Echocardiography is a painless test that uses sound waves to create images of your heart. It provides your doctor with information about the size and shape of your heart and how well your heart's chambers and valves are working. This procedure takes approximately one hour. There are no restrictions for this procedure. Please do NOT wear cologne, perfume, aftershave, or lotions (deodorant is allowed). Please arrive 15 minutes prior to your appointment time.    Follow-Up: At [facility], you and your health needs are our priority.  As part of our continuing mission to provide you with exceptional heart care, we have created designated Provider Care Teams.  These Care Teams include your primary Cardiologist (physician) and Advanced Practice Providers (APPs -  Physician Assistants and Nurse Practitioners) who all work together to provide you with the care you need, when you need it.  We recommend signing up for the patient portal called "MyChart".  Sign up information is provided on this After Visit Summary.  MyChart is used to connect with patients for Virtual Visits (Telemedicine).  Patients are able to view lab/test results, encounter notes, upcoming appointments, etc.  Non-urgent  messages can be sent to your provider as well.   To learn more about what you can do with MyChart, go to [email or web address].    Your next appointment:     1 year with Dr.Nishan   You have been referred to Lipid Clinic,they  will call you to schedule appointment    Provider:   Charlton Haws, MD    Other Instructions

## 2023-01-12 ENCOUNTER — Ambulatory Visit (HOSPITAL_COMMUNITY)
Admission: RE | Admit: 2023-01-12 | Discharge: 2023-01-12 | Disposition: A | Discharge status: Home / Self Care | Payer: Medicare Other | Source: Ambulatory Visit | Attending: Cardiovascular Disease | Admitting: Cardiovascular Disease

## 2023-01-12 DIAGNOSIS — I350 Nonrheumatic aortic (valve) stenosis: Secondary | ICD-10-CM | POA: Insufficient documentation

## 2023-01-12 LAB — ECHOCARDIOGRAM COMPLETE
AR max vel: 2.07 cm2
AV Area VTI: 2.08 cm2
AV Area mean vel: 1.99 cm2
AV Mean grad: 16 mm[Hg]
AV Peak grad: 28.5 mm[Hg]
Ao pk vel: 2.67 m/s
Area-P 1/2: 2.82 cm2
Calc EF: 71.1 %
MV VTI: 3.73 cm2
S' Lateral: 2.3 cm
Single Plane A2C EF: 74.5 %
Single Plane A4C EF: 68.9 %

## 2023-01-18 ENCOUNTER — Other Ambulatory Visit (HOSPITAL_COMMUNITY)
Admission: RE | Admit: 2023-01-18 | Discharge: 2023-01-18 | Disposition: A | Discharge status: Home / Self Care | Payer: Medicare Other | Source: Ambulatory Visit | Attending: Cardiovascular Disease | Admitting: Cardiovascular Disease

## 2023-01-18 DIAGNOSIS — R931 Abnormal findings on diagnostic imaging of heart and coronary circulation: Secondary | ICD-10-CM

## 2023-01-18 DIAGNOSIS — F32A Depression, unspecified: Secondary | ICD-10-CM | POA: Insufficient documentation

## 2023-01-18 DIAGNOSIS — E785 Hyperlipidemia, unspecified: Secondary | ICD-10-CM | POA: Insufficient documentation

## 2023-01-18 DIAGNOSIS — I10 Essential (primary) hypertension: Secondary | ICD-10-CM | POA: Insufficient documentation

## 2023-01-18 DIAGNOSIS — I2510 Atherosclerotic heart disease of native coronary artery without angina pectoris: Secondary | ICD-10-CM | POA: Diagnosis not present

## 2023-01-18 DIAGNOSIS — E7800 Pure hypercholesterolemia, unspecified: Secondary | ICD-10-CM | POA: Diagnosis present

## 2023-01-18 DIAGNOSIS — M5431 Sciatica, right side: Secondary | ICD-10-CM | POA: Diagnosis not present

## 2023-01-18 DIAGNOSIS — I350 Nonrheumatic aortic (valve) stenosis: Secondary | ICD-10-CM

## 2023-01-18 LAB — LIPID PANEL
Cholesterol: 196 mg/dL (ref 0–200)
HDL: 49 mg/dL (ref >40)
LDL Cholesterol: 132 mg/dL — ABNORMAL HIGH (ref 0–99)
Total CHOL/HDL Ratio: 4 {ratio}
Triglycerides: 73 mg/dL (ref <150)
VLDL: 15 mg/dL (ref 0–40)

## 2023-01-18 LAB — HEPATIC FUNCTION PANEL
ALT: 28 U/L (ref 0–44)
AST: 32 U/L (ref 15–41)
Albumin: 3.8 g/dL (ref 3.5–5.0)
Alkaline Phosphatase: 91 U/L (ref 38–126)
Bilirubin, Direct: 0.1 mg/dL (ref 0.0–0.2)
Indirect Bilirubin: 0.8 mg/dL (ref 0.3–0.9)
Total Bilirubin: 0.9 mg/dL (ref <1.2)
Total Protein: 7.1 g/dL (ref 6.5–8.1)

## 2023-01-29 ENCOUNTER — Other Ambulatory Visit: Payer: Self-pay | Admitting: Internal Medicine

## 2023-01-29 DIAGNOSIS — Z1231 Encounter for screening mammogram for malignant neoplasm of breast: Secondary | ICD-10-CM

## 2023-02-02 ENCOUNTER — Telehealth: Payer: Self-pay

## 2023-02-02 DIAGNOSIS — E782 Mixed hyperlipidemia: Secondary | ICD-10-CM

## 2023-02-02 NOTE — Telephone Encounter (Signed)
-----   Message from Nurse Deon Pilling sent at 02/02/2023  2:08 PM EST -----  ----- Message ----- From: [name], MD Sent: 01/18/2023  12:10 PM EST To: Ethelda Chick, RN  LDL too high for calcium score if she has been compliant with zetia and 80 mg lipitor have her see Pharm D to consider PSK9

## 2023-02-02 NOTE — Telephone Encounter (Signed)
Results discussed with patient,confirmed she is taking zetia and lipitor. Pt agreeable with referral to lipid management

## 2023-03-05 ENCOUNTER — Ambulatory Visit
Admission: RE | Admit: 2023-03-05 | Discharge: 2023-03-05 | Disposition: A | Discharge status: Home / Self Care | Payer: Medicare Other | Source: Ambulatory Visit | Attending: Internal Medicine | Admitting: Internal Medicine

## 2023-03-05 DIAGNOSIS — Z1231 Encounter for screening mammogram for malignant neoplasm of breast: Secondary | ICD-10-CM

## 2023-03-13 ENCOUNTER — Telehealth: Payer: Self-pay | Admitting: Pharmacist

## 2023-03-13 ENCOUNTER — Ambulatory Visit: Payer: Medicare Other | Attending: Cardiology | Admitting: Student

## 2023-03-13 ENCOUNTER — Encounter: Payer: Self-pay | Admitting: Student

## 2023-03-13 ENCOUNTER — Other Ambulatory Visit (HOSPITAL_COMMUNITY): Payer: Self-pay

## 2023-03-13 ENCOUNTER — Telehealth: Payer: Self-pay | Admitting: Pharmacy Technician

## 2023-03-13 DIAGNOSIS — E7800 Pure hypercholesterolemia, unspecified: Secondary | ICD-10-CM

## 2023-03-13 DIAGNOSIS — E782 Mixed hyperlipidemia: Secondary | ICD-10-CM

## 2023-03-13 MED ORDER — REPATHA SURECLICK 140 MG/ML ~~LOC~~ SOAJ: 140.0000 mg | SUBCUTANEOUS | 3 refills | Status: DC

## 2023-03-13 MED ORDER — ATORVASTATIN CALCIUM 80 MG PO TABS: 80.0000 mg | ORAL_TABLET | Freq: Every day | ORAL | 3 refills | Status: DC

## 2023-03-13 NOTE — Progress Notes (Signed)
 Patient ID: Cynthia Cochran                 DOB: 01-27-1951                    MRN: [id number]      HPI: Cynthia Cochran is a 72 y.o. female patient referred to lipid clinic by Dr. Delford. PMH is significant for HLD, osteoarthritis, CAD, Coronary calcium  score of 257.  LDL 131 Triglycerides 196 89/76/76 Zetia  added to high dose lipitor  f/u labs LDLc level remained unchanged not sure patient was taking Zetia  along with Lipitor  high dose. Fill hx for Zetia  10/2022 for 90 days and 01/2023 for 90 days and getting lipitor  filled regularly too.  Today patient presented for lipid clinic. Reports  she has been taking lipitor  and Zetia  for lon time. Her PCP forgot to prescribe Zetia  so she was off of that for 2-3 weeks but she has been on this combination for now 3 years. She has lost almost 40 lbs in past 2 years due to stress and sciatic nerve pain. She has been following good diet but not been able to do regular physical activity due to lower back pain. Reviewed options for lowering LDL cholesterol, including ezetimibe , PCSK-9 inhibitors, bempedoic acid and inclisiran.  Discussed mechanisms of action, dosing, side effects and potential decreases in LDL cholesterol.  Also reviewed cost information and potential options for patient assistance.  Current Medications: Lipitor  80 mg daily and Zetia  10 mg daily  Intolerances: none  Risk Factors: CAD,Coronary calcium  score of 257 (87th percentile) LDL goal: <70 mg/dl   Diet: has been eating healthy   Exercise: none due to back pain    Labs: Lipid Panel     Component Value Date/Time   CHOL 196 01/18/2023 1126   CHOL 209 (H) 01/02/2022 0850   TRIG 73 01/18/2023 1126   HDL 49 01/18/2023 1126   HDL 43 01/02/2022 0850   CHOLHDL 4.0 01/18/2023 1126   VLDL 15 01/18/2023 1126   LDLCALC 132 (H) 01/18/2023 1126   LDLCALC 131 (H) 01/02/2022 0850   LABVLDL 35 01/02/2022 0850    Past Medical History:  Diagnosis Date   Anxiety    Arthritis    Blood  transfusion without reported diagnosis 1982   after c section   Dysphagia 04/25/2016   Gastric ulcer    GERD (gastroesophageal reflux disease)    Heart murmur    High cholesterol 04/25/2016   Hyperlipidemia    Mixed stress and urge urinary incontinence 09/09/2015   Sciatic nerve pain    Spinal stenosis     Current Outpatient Medications on File Prior to Visit  Medication Sig Dispense Refill   acetaminophen  (TYLENOL ) 650 MG CR tablet Take 1,300 mg by mouth every 8 (eight) hours as needed for pain.     atorvastatin  (LIPITOR ) 80 MG tablet Take 80 mg by mouth at bedtime.      Cholecalciferol (DIALYVITE VITAMIN D 5000) 125 MCG (5000 UT) capsule Take 5,000 Units by mouth daily.     COLLAGEN PO Take 1 capsule by mouth daily.     estradiol  (ESTRACE ) 0.1 MG/GM vaginal cream Place 0.5 g vaginally 2 (two) times a week. Place 0.5g nightly for two weeks then twice a week after 42.5 g 11   ezetimibe  (ZETIA ) 10 MG tablet Take 10 mg by mouth daily.     gabapentin  (NEURONTIN ) 300 MG capsule Take 300 mg by mouth 2 (two) times daily.  hydrochlorothiazide (MICROZIDE) 12.5 MG capsule Take 12.5 mg by mouth daily.     HYDROcodone -acetaminophen  (NORCO/VICODIN) 5-325 MG tablet Take 1-2 tablets by mouth every 6 (six) hours as needed. 6 tablet 0   HYDROmorphone  (DILAUDID ) 2 MG tablet Take 1 tablet (2 mg total) by mouth every 6 (six) hours as needed for up to 12 doses for severe pain. (Patient not taking: Reported on 01/01/2023) 12 tablet 0   losartan  (COZAAR ) 100 MG tablet Take 100 mg by mouth daily.     methocarbamol  (ROBAXIN ) 500 MG tablet Take 500 mg by mouth every 6 (six) hours as needed for muscle spasms.     polyethylene glycol (MIRALAX / GLYCOLAX) 17 g packet Take 17 g by mouth daily. One capful daily in coffee     Psyllium (METAMUCIL PO) Take 1 Dose by mouth daily. 1 dose = 2 teaspoons     Specialty Vitamins Products (BIOTIN PLUS KERATIN PO) Take 1 capsule by mouth daily.     tiZANidine  (ZANAFLEX ) 4 MG  tablet Take 1 tablet (4 mg total) by mouth every 8 (eight) hours as needed for muscle spasms. 20 tablet 0   Turmeric Curcumin 500 MG CAPS Take 500 mg by mouth daily.     Vibegron  (GEMTESA ) 75 MG TABS Take 1 tablet by mouth once daily 30 tablet 11   No current facility-administered medications on file prior to visit.    Allergies  Allergen Reactions   Lyrica [Pregabalin] Other (See Comments)    Feet numb   Mobic [Meloxicam] Other (See Comments)    Avoid due to a history of ulcers    Nsaids Other (See Comments)    Avoid due to a history of ulcers     Assessment/Plan:  1. Hyperlipidemia -  Problem  High Cholesterol   High cholesterol Assessment:  LDL goal: < 70 mg/dl last LDLc 867 mg/dl (88/92/7975) Tolerates Zetia  and high intensity statins well without any side effects  Follows healthy diet and unable to do exercise due to chronic back pain Discussed next potential options (PCSK-9 inhibitors, bempedoic acid and inclisiran); cost, dosing efficacy, side effects   Plan: Continue taking current medications - Lipitor  80 mg daily and Zetia  10 mg daily Will apply for PA for PCSK9i; will inform patient upon approval  Lipid lab due in 2-3 months after starting PCSK9i    Thank you,  Robbi Blanch, Pharm.D [facility] Division of [facility] [address] [place], [place] [address]  Phone: [phone]; Fax: [phone]

## 2023-03-13 NOTE — Telephone Encounter (Signed)
 Pharmacy Patient Advocate Encounter   Received notification from Pt Calls Messages that prior authorization for repatha  is required/requested.   Insurance verification completed.   The patient is insured through [employer] .   Per test claim: PA required; PA submitted to above mentioned insurance via CoverMyMeds Key/confirmation #/EOC [id number] Status is pending

## 2023-03-13 NOTE — Telephone Encounter (Signed)
 PCSK9i PA request sent to the pool

## 2023-03-13 NOTE — Assessment & Plan Note (Addendum)
 Assessment:  LDL goal: < 70 mg/dl last LDLc 867 mg/dl (88/92/7975) Tolerates Zetia  and high intensity statins well without any side effects  Follows healthy diet and unable to do exercise due to chronic back pain Discussed next potential options (PCSK-9 inhibitors, bempedoic acid and inclisiran); cost, dosing efficacy, side effects   Plan: Continue taking current medications - Lipitor  80 mg daily and Zetia  10 mg daily Will apply for PA for PCSK9i; will inform patient upon approval  Lipid lab due in 2-3 months after starting PCSK9i

## 2023-03-13 NOTE — Addendum Note (Signed)
Addended by: Tylene Fantasia on: 03/13/2023 11:57 AM   Modules accepted: Orders

## 2023-03-13 NOTE — Telephone Encounter (Signed)
Called and grant info shared via MyChart  F/u lab due on March 24.

## 2023-03-13 NOTE — Telephone Encounter (Signed)
 Pharmacy Patient Advocate Encounter  Received notification from [employer] that Prior Authorization for repatha  has been APPROVED from 03/13/23 to 09/10/23. Ran test claim, Copay is $101.99 one month. This test claim was processed through [facility]- copay amounts may vary at other pharmacies due to pharmacy/plan contracts, or as the patient moves through the different stages of their insurance plan.   PA #/Case ID/Reference #: [id number]

## 2023-03-13 NOTE — Patient Instructions (Addendum)
 Your Results:             Your most recent labs Goal  Total Cholesterol 196 < 200  Triglycerides 73 < 150  HDL (happy/good cholesterol) 49 > 40  LDL (lousy/bad cholesterol 132 < 70   Medication changes: continue taking atorvastatin  and ezetimibe   We will start the process to get PCSK9i (Repatha  or Praluent) covered by your insurance.  Once the prior authorization is complete, we will call you to let you know and confirm pharmacy information.   Praluent is a cholesterol medication that improved your body's ability to get rid of bad cholesterol known as LDL. It can lower your LDL up to 60%. It is an injection that is given under the skin every 2 weeks. The most common side effects of Praluent include runny nose, symptoms of the common cold, rarely flu or flu-like symptoms, back/muscle pain in about 3-4% of the patients, and redness, pain, or bruising at the injection site.    Repatha  is a cholesterol medication that improved your body's ability to get rid of bad cholesterol known as LDL. It can lower your LDL up to 60%! It is an injection that is given under the skin every 2 weeks. The medication often requires a prior authorization from your insurance company. We will take care of submitting all the necessary information to your insurance company to get it approved. The most common side effects of Repatha  include runny nose, symptoms of the common cold, rarely flu or flu-like symptoms, back/muscle pain in about 3-4% of the patients, and redness, pain, or bruising at the injection site.   Lab orders: We want to repeat labs after 2-3 months.  We will send you a lab order to remind you once we get closer to that time.        Copay Assistance:  The [employer] foundation offers assistance to help pay for medication copays.  They will cover copays for all cholesterol lowering meds, including statins, fibrates, omega-3 oils, ezetimibe , Repatha , Praluent, Nexletol, Nexlizet.  The cards are usually  good for $2,500 or 12 months, whichever comes first. Go to healthwellfoundation.org Click on "Apply Now" Answer questions as to whom is applying (patient or representative) Your disease fund will be "hypercholesterolemia - Medicare access" Select the cholesterol medication you need assistance with (Repatha , Praluent, Nexlizet...) They will ask question about qualifying diagnosis - you can mark yes; and do you have insurance coverage.   When they ask what type of assistance you are interested in - copay assistance When you submit, the approval is usually within minutes.  You will need to print the card information from the site You will need to show this information to your pharmacy, they will bill your Medicare Part D plan first -then bill [employer] --for the copay.   You can also call them at [phone], although the hold times can be quite long.

## 2023-03-20 ENCOUNTER — Encounter: Payer: Self-pay | Admitting: Obstetrics and Gynecology

## 2023-03-20 DIAGNOSIS — N3281 Overactive bladder: Secondary | ICD-10-CM

## 2023-03-20 DIAGNOSIS — R350 Frequency of micturition: Secondary | ICD-10-CM

## 2023-03-20 MED ORDER — TROSPIUM CHLORIDE ER 60 MG PO CP24
60.0000 mg | ORAL_CAPSULE | Freq: Every day | ORAL | 2 refills | Status: DC
Start: 2023-03-20 — End: 2023-06-11

## 2023-03-20 NOTE — Progress Notes (Signed)
 Request has been submitted for teir reduction through [facility] at [phone] Teir reduction is pending  Key: [id number]

## 2023-03-28 NOTE — Progress Notes (Addendum)
 Faxed received from [facility] [facility] that cancelled the PA tier exception. PA good through 03/14/2023-03/12/2024

## 2023-06-05 NOTE — Telephone Encounter (Signed)
 F/u lipid lab due, called patient. Reports got lab done at PCP TC - 119, TG 58, HDL 73, VLDL 13, LDL 33, (goal <70)She has been on Lipitor 80 mg daily  Zetia 10 mg daily with Repatha 140 mg Q14D.   Advice to stop Zetia and continue with Lipitor 80 mg daily and Repatha 140 mg Q14D. Patient verbalized agreement and appreciation.

## 2023-06-11 ENCOUNTER — Other Ambulatory Visit: Payer: Self-pay | Admitting: Obstetrics and Gynecology

## 2023-06-11 DIAGNOSIS — R350 Frequency of micturition: Secondary | ICD-10-CM

## 2023-06-11 DIAGNOSIS — N3281 Overactive bladder: Secondary | ICD-10-CM

## 2023-07-05 ENCOUNTER — Ambulatory Visit: Payer: Medicare Other | Admitting: Obstetrics and Gynecology

## 2023-07-05 ENCOUNTER — Encounter: Payer: Self-pay | Admitting: Obstetrics and Gynecology

## 2023-07-05 VITALS — BP 158/84 | HR 76

## 2023-07-05 DIAGNOSIS — N393 Stress incontinence (female) (male): Secondary | ICD-10-CM | POA: Diagnosis not present

## 2023-07-05 DIAGNOSIS — N3281 Overactive bladder: Secondary | ICD-10-CM | POA: Diagnosis not present

## 2023-07-05 DIAGNOSIS — R102 Pelvic and perineal pain: Secondary | ICD-10-CM

## 2023-07-05 DIAGNOSIS — R350 Frequency of micturition: Secondary | ICD-10-CM

## 2023-07-05 NOTE — Patient Instructions (Signed)
 Let's plan for the urethral bulking and a possible pudendal nerve block. The scheduler will call you and do the prior authorization for the procedure.

## 2023-07-05 NOTE — Progress Notes (Signed)
[phone] 

## 2023-07-05 NOTE — Progress Notes (Signed)
 [facility] Return Visit  SUBJECTIVE  History of Present Illness: Cynthia Cochran is a 73 y.o. female seen in follow-up for OAB, SUI, and Pelvic floor dysfunction. Plan at last visit was continue Gemtesa  75mg  for OAB. Patient reports her insurance went up on the price of the Gemtesa  and she could no longer afford it. She was switched to Trospium  60mg  ER daily, but this has not been as effective.   Patient also report her leakage with cough, sneeze, and picking up heavier items has been more significant.     Past Medical History: Patient  has a past medical history of Anxiety, Arthritis, Blood transfusion without reported diagnosis (1982), Dysphagia (04/25/2016), Gastric ulcer, GERD (gastroesophageal reflux disease), Heart murmur, High cholesterol (04/25/2016), Hyperlipidemia, Mixed stress and urge urinary incontinence (09/09/2015), Sciatic nerve pain, and Spinal stenosis.   Past Surgical History: She  has a past surgical history that includes Cesarean section (1982); Carpal tunnel release; Total hip arthroplasty; Colonscopy; Esophagogastroduodenoscopy (N/A, 05/12/2016); Esophageal dilation (N/A, 05/12/2016); biopsy (05/12/2016); Colonoscopy with propofol  (N/A, 10/04/2018); Esophagogastroduodenoscopy (egd) with propofol  (N/A, 10/04/2018); biopsy (10/04/2018); Joint replacement; Eye surgery; Lumbar laminectomy/decompression microdiscectomy (N/A, 08/05/2020); Lumbar laminectomy/decompression microdiscectomy (N/A, 10/14/2020); and Flexible sigmoidoscopy (N/A, 01/28/2021).   Medications: She has a current medication list which includes the following prescription(s): acetaminophen , atorvastatin , estradiol , repatha  sureclick, hydrochlorothiazide, losartan , methocarbamol , polyethylene glycol, psyllium, and trospium  chloride.   Allergies: Patient is allergic to lyrica [pregabalin], mobic [meloxicam], and nsaids.   Social History: Patient  reports that she has never smoked. She has never used smokeless  tobacco. She reports current alcohol use. She reports that she does not use drugs.     OBJECTIVE    Verbal consent was obtained to perform simple CMG procedure:   Urethra was prepped and a 39F catheter was placed and bladder was drained completely. The bladder was then backfilled with sterile water  by gravity.  First sensation: 300 First Desire: 360 Strong Desire: 470 Capacity: 620 Cough stress test was positive. Valsalva stress test was positive.  She was was allowed to void on her own.   Interpretation: CMG showed decreased sensation, and within normal limits cystometric capacity. Findings positive for stress incontinence, positive for detrusor overactivity.     Physical Exam: Vitals:   07/05/23 0902  BP: (!) 158/84  Pulse: 76   Gen: No apparent distress, A&O x 3.  Normal urethra. Patient has a hardened area of skin at the perineum and when that is pushed on she described this as when she feels she is sitting on a tennis ball.     ASSESSMENT AND PLAN    Cynthia Cochran is a 73 y.o. with:  1. Overactive bladder   2. Urinary frequency   3. Perineal pain   4. SUI (stress urinary incontinence, female)    Patient is on Trospium  60mg  ER daily. She is doing okay on this but it is not as well controlled as it was with Gemtesa  75mg  daily. She reports she is going to contact her insurance to see if she has met her deductible.  Still has frequency.  Patient's perineal pain has been attempted to be treated with pelvic floor PT, pudendal nerve blocks and pelvic floor injections.  Patient has significant SUI. Simple CMG showed positive leakage. Patient is interested in urethral bulking. Will plan for her to return with Cynthia Cochran for urethral bulking.  Patient to return for UB and possible biopsy of perineal tissue depending on Cynthia Cochran opinion of the tissue.  Cynthia Daily G Tyshea Imel, NP

## 2023-07-08 ENCOUNTER — Other Ambulatory Visit: Payer: Self-pay | Admitting: Obstetrics and Gynecology

## 2023-07-08 DIAGNOSIS — N3281 Overactive bladder: Secondary | ICD-10-CM

## 2023-07-08 DIAGNOSIS — R350 Frequency of micturition: Secondary | ICD-10-CM

## 2023-07-09 ENCOUNTER — Telehealth: Payer: Self-pay | Admitting: Orthopedic Surgery

## 2023-07-09 NOTE — Telephone Encounter (Signed)
 Spoke w/this pt, she has a friend that is a pt here and his highly recommended Dr. Marvina Slough to her.  She had bil hip replacement about 53yrs ago at [facility].  Her doctor has retired.  She is now having thigh to hip pain and would like to see. Dr. Marvina Slough for it.  Dr. Marvina Slough, please advise, will you see the pt and do you need records and x-rays from 66yrs ago?  [phone]

## 2023-07-10 ENCOUNTER — Encounter: Payer: Self-pay | Admitting: Obstetrics and Gynecology

## 2023-07-10 MED ORDER — VIBEGRON 75 MG PO TABS
75.0000 mg | ORAL_TABLET | Freq: Every day | ORAL | 3 refills | Status: DC
Start: 2023-07-10 — End: 2023-09-10

## 2023-07-25 ENCOUNTER — Ambulatory Visit: Admitting: Orthopedic Surgery

## 2023-08-13 ENCOUNTER — Other Ambulatory Visit: Payer: Self-pay | Admitting: Internal Medicine

## 2023-08-13 DIAGNOSIS — R0989 Other specified symptoms and signs involving the circulatory and respiratory systems: Secondary | ICD-10-CM

## 2023-08-15 ENCOUNTER — Ambulatory Visit
Admission: RE | Admit: 2023-08-15 | Discharge: 2023-08-15 | Disposition: A | Discharge status: Home / Self Care | Source: Ambulatory Visit | Attending: Internal Medicine | Admitting: Internal Medicine

## 2023-08-15 DIAGNOSIS — R0989 Other specified symptoms and signs involving the circulatory and respiratory systems: Secondary | ICD-10-CM

## 2023-08-17 ENCOUNTER — Encounter (INDEPENDENT_AMBULATORY_CARE_PROVIDER_SITE_OTHER): Payer: Self-pay | Admitting: *Deleted

## 2023-08-28 DIAGNOSIS — M48061 Spinal stenosis, lumbar region without neurogenic claudication: Secondary | ICD-10-CM | POA: Insufficient documentation

## 2023-08-28 DIAGNOSIS — M51369 Other intervertebral disc degeneration, lumbar region without mention of lumbar back pain or lower extremity pain: Secondary | ICD-10-CM | POA: Insufficient documentation

## 2023-08-28 DIAGNOSIS — G609 Hereditary and idiopathic neuropathy, unspecified: Secondary | ICD-10-CM | POA: Insufficient documentation

## 2023-08-28 DIAGNOSIS — M4714 Other spondylosis with myelopathy, thoracic region: Secondary | ICD-10-CM | POA: Insufficient documentation

## 2023-08-28 NOTE — Progress Notes (Signed)
 Name: Cynthia Cochran DOB: 1950-07-24 MRN: [id number]  History of Present Illness: Cynthia Cochran is a 73 y.o. female who presents today as a new patient at [facility] [place].   Relevant History - She is followed by [facility] for the following: 1. OAB with urinary frequency, nocturia, urgency, and urge incontinence. - Taking Gemtesa . When in the insurance donut hole she switches to Trospium  60 mg ER daily (not as effective).  2. Stress urinary incontinence. She is scheduled for urethral bulking procedure with Dr. Frutoso Jing on 09/27/2023. 3. Pelvic floor dysfunction.  4. Perineal pain. Has had prior pelvic floor PT, pudendal nerve blocks and pelvic floor injections.  5. Vaginal atrophy. Uses topical vaginal estrogen cream. 6. Cystocele. 7. Chronic constipation.  Today She presents to Urology for chief complaint of microscopic hematuria. Unfortunately the scanned records from [facility] are unreadable / blurry; requesting to have those resent. Negative urine cultures on 09/20/2022 and 09/23/2022; no additional urine culture results in past 12 months found per chart review.  She reports that in the past 2 weeks she has been having dysuria, increased urinary urgency, frequency, profuse sweating, and fatigue. Denies abdominal pain, nausea, or vomiting. Denies gross hematuria, hesitancy, straining to void, or sensations of incomplete emptying.  She is currently taking Doxyccyline for a leg wound / skin infection - on day 12 of 14.   She reports right flank pain for the past several months. She is followed by pain specialty for chronic back problems / pain. She reports that her PCP did an ultrasound 1-2 weeks ago and didn't seen anything concerning with her kidneys. She had a CT on 10/26/2022 which showed no GU stones, masses, or hydronephrosis; bladder unremarkable.   She denies history of kidney stones, recent / recurrent UTIs. She denies history of GU  malignancy or pelvic radiation.  She denies history of autoimmune disease. She denies history of smoking. She denies taking anticoagulants.  Medications: Current Outpatient Medications  Medication Sig Dispense Refill   acetaminophen  (TYLENOL ) 650 MG CR tablet Take 1,300 mg by mouth every 8 (eight) hours as needed for pain.     atorvastatin  (LIPITOR ) 80 MG tablet Take 80 mg by mouth daily.     estradiol  (ESTRACE ) 0.1 MG/GM vaginal cream Place 0.5 g vaginally 2 (two) times a week. Place 0.5g nightly for two weeks then twice a week after 42.5 g 11   Evolocumab  (REPATHA  SURECLICK) 140 MG/ML SOAJ Inject 140 mg into the skin every 14 (fourteen) days. 6 mL 3   hydrochlorothiazide (MICROZIDE) 12.5 MG capsule Take 12.5 mg by mouth daily.     losartan  (COZAAR ) 100 MG tablet Take 100 mg by mouth daily.     nitrofurantoin, macrocrystal-monohydrate, (MACROBID) 100 MG capsule Take 1 capsule (100 mg total) by mouth 2 (two) times daily for 7 days. 14 capsule 0   polyethylene glycol (MIRALAX / GLYCOLAX) 17 g packet Take 17 g by mouth daily. One capful daily in coffee     Psyllium (METAMUCIL PO) Take 1 Dose by mouth daily. 1 dose = 2 teaspoons     Vibegron  75 MG TABS Take 1 tablet (75 mg total) by mouth daily. 30 tablet 3   methocarbamol  (ROBAXIN ) 500 MG tablet Take 500 mg by mouth every 6 (six) hours as needed for muscle spasms.     No current facility-administered medications for this visit.    Allergies: Allergies  Allergen Reactions   Lyrica [Pregabalin] Other (See Comments)  Feet numb   Mobic [Meloxicam] Other (See Comments)    Avoid due to a history of ulcers    Nsaids Other (See Comments)    Avoid due to a history of ulcers     Past Medical History:  Diagnosis Date   Anxiety    Arthritis    Blood transfusion without reported diagnosis 1982   after c section   Dysphagia 04/25/2016   Gastric ulcer    GERD (gastroesophageal reflux disease)    Heart murmur    High cholesterol 04/25/2016    Hyperlipidemia    Mixed stress and urge urinary incontinence 09/09/2015   Sciatic nerve pain    Spinal stenosis    Past Surgical History:  Procedure Laterality Date   BIOPSY  05/12/2016   Procedure: BIOPSY;  Surgeon: Ruby Corporal, MD;  [facility]: [facility];  Service: Endoscopy;;  gastric   BIOPSY  10/04/2018   Procedure: BIOPSY;  Surgeon: Ruby Corporal, MD;  [facility]: [facility];  Service: Endoscopy;;  gastric    CARPAL TUNNEL RELEASE     right; 1998, left 1998   CESAREAN SECTION  1982   COLONOSCOPY WITH PROPOFOL  N/A 10/04/2018   Procedure: COLONOSCOPY WITH PROPOFOL ;  Surgeon: Ruby Corporal, MD;  [facility]: [facility];  Service: Endoscopy;  Laterality: N/A;  7:30   Colonscopy     ESOPHAGEAL DILATION N/A 05/12/2016   Procedure: ESOPHAGEAL DILATION;  Surgeon: Ruby Corporal, MD;  [facility]: [facility];  Service: Endoscopy;  Laterality: N/A;   ESOPHAGOGASTRODUODENOSCOPY N/A 05/12/2016   Procedure: ESOPHAGOGASTRODUODENOSCOPY (EGD);  Surgeon: Ruby Corporal, MD;  [facility];  Service: Endoscopy;  Laterality: N/A;  9:30   ESOPHAGOGASTRODUODENOSCOPY (EGD) WITH PROPOFOL  N/A 10/04/2018   Procedure: ESOPHAGOGASTRODUODENOSCOPY (EGD) WITH PROPOFOL ;  Surgeon: Ruby Corporal, MD;  [facility]: [facility];  Service: Endoscopy;  Laterality: N/A;   EYE SURGERY     lasix   FLEXIBLE SIGMOIDOSCOPY N/A 01/28/2021   Procedure: FLEXIBLE SIGMOIDOSCOPY WITH PROPOFOL ;  Surgeon: Ruby Corporal, MD;  [facility]: [facility];  Service: Endoscopy;  Laterality: N/A;  9:30   JOINT REPLACEMENT     both hips   LUMBAR LAMINECTOMY/DECOMPRESSION MICRODISCECTOMY N/A 08/05/2020   Procedure: LAMINECTOMY THORACIC TEN-ELEVEN;  Surgeon: Audie Bleacher, MD;  Location: [facility];  Service: Neurosurgery;  Laterality: N/A;   LUMBAR LAMINECTOMY/DECOMPRESSION MICRODISCECTOMY N/A 10/14/2020   Procedure: Right Lumbar 4-5, Lumbar 5 Sacral 1 Lumbar and thoracic laminectomy;  Surgeon: Audie Bleacher, MD;  Location:  [facility];  Service: Neurosurgery;  Laterality: N/A;  Right Lumbar 4-5, Lumbar 5 Sacral 1 Lumbar and thoracic laminectomy   TOTAL HIP ARTHROPLASTY     right; March 2013; left March 2011   Family History  Problem Relation Age of Onset   Other Mother        heavy smoker; died from arterial sclerosis   Colon cancer Father 61   Hypertension Sister    Colon cancer Brother 39   Stomach cancer Neg Hx    Breast cancer Neg Hx    Social History   Socioeconomic History   Marital status: Widowed    Spouse name: Not on file   Number of children: 0   Years of education: some college   Highest education level: Not on file  Occupational History   Occupation: Retired  Tobacco Use   Smoking status: Never   Smokeless tobacco: Never  Vaping Use   Vaping status: Never Used  Substance and Sexual Activity  Alcohol use: Yes    Comment: social drinking - a glass of wine about every 2-3 weeka   Drug use: No   Sexual activity: Not Currently    Birth control/protection: Post-menopausal, Abstinence  Other Topics Concern   Not on file  Social History Narrative   Lives alone   Caffeine use: no more than 2 cups per day    16 oz soda per day   Right handed    Social Drivers of Health   Financial Resource Strain: Low Risk  (04/15/2021)   Overall Financial Resource Strain (CARDIA)    Difficulty of Paying Living Expenses: Not hard at all  Food Insecurity: No Food Insecurity (04/15/2021)   Hunger Vital Sign    Worried About Running Out of Food in the Last Year: Never true    Ran Out of Food in the Last Year: Never true  Transportation Needs: No Transportation Needs (04/15/2021)   PRAPARE - Administrator, Civil Service (Medical): No    Lack of Transportation (Non-Medical): No  Physical Activity: Insufficiently Active (04/15/2021)   Exercise Vital Sign    Days of Exercise per Week: 3 days    Minutes of Exercise per Session: 20 min  Stress: No Stress Concern Present (04/15/2021)   [employer] - Occupational Stress Questionnaire    Feeling of Stress : Not at all  Social Connections: Unknown (07/26/2021)   Received from [facility]   Social Network    Social Network: Not on file  Intimate Partner Violence: Unknown (06/16/2021)   Received from [facility]   HITS    Physically Hurt: Not on file    Insult or Talk Down To: Not on file    Threaten Physical Harm: Not on file    Scream or Curse: Not on file    SUBJECTIVE  Review of Systems Constitutional: Patient denies any unintentional weight loss or change in strength lntegumentary: Patient denies any rashes or pruritus Cardiovascular: Patient denies chest pain or syncope Respiratory: Patient denies shortness of breath Gastrointestinal: Patient denies nausea, vomiting, constipation, or diarrhea  Musculoskeletal: Patient denies muscle cramps or weakness Neurologic: Patient denies convulsions or seizures Allergic/Immunologic: Patient denies recent allergic reaction(s) Hematologic/Lymphatic: Patient denies bleeding tendencies Endocrine: Patient reports sweating  GU: As per HPI.  OBJECTIVE Vitals:   08/29/23 0959  BP: 134/80  Pulse: 66   There is no height or weight on file to calculate BMI.  Physical Examination Constitutional: No obvious distress; patient is non-toxic appearing  Cardiovascular: No visible lower extremity edema.  Respiratory: The patient does not have audible wheezing/stridor; respirations do not appear labored  Gastrointestinal: Abdomen non-distended Musculoskeletal: Normal ROM of UEs  Skin: No obvious rashes/open sores  Neurologic: CN 2-12 grossly intact Psychiatric: Answered questions appropriately with normal affect  Hematologic/Lymphatic/Immunologic: No obvious bruises or sites of spontaneous bleeding  UA/urine microscopy: >30 WBC/hpf, >30 RBC/hpf, many bacteria, protein PVR: 0 ml  ASSESSMENT Other microscopic hematuria - Plan: Urinalysis, Routine w  reflex microscopic, BLADDER SCAN AMB NON-IMAGING  UTI symptoms - Plan: Urine culture, nitrofurantoin, macrocrystal-monohydrate, (MACROBID) 100 MG capsule  Abnormal urinalysis - Plan: Urine culture, nitrofurantoin, macrocrystal-monohydrate, (MACROBID) 100 MG capsule  Unable to assess for possible microscopic hematuria with inadequate prior records and grossly abnormal UA/micro today with acute UTI symptoms. Will check urine culture and treat empirically with Macrobid while awaiting culture results and sensitivities. Will determine next steps based on urine culture result, review of prior records once received in a  legible scanned format, and 2 week repeat UA/micro. Pt verbalized understanding and agreement. All questions were answered.  PLAN Advised the following: 1. Urine culture. 2. Macrobid (Nitrofurantoin) 100 mg 2x/day for 7 days. 3. Re-requesting records from PCP for review. 4. Return in about 2 weeks (around 09/12/2023) for lab visit for urine microscopy.  Orders Placed This Encounter  Procedures   Urine culture   Urinalysis, Routine w reflex microscopic   BLADDER SCAN AMB NON-IMAGING    It has been explained that the patient is to follow regularly with their PCP in addition to all other providers involved in their care and to follow instructions provided by these respective offices. Patient advised to contact urology clinic if any urologic-pertaining questions, concerns, new symptoms or problems arise in the interim period.  There are no Patient Instructions on file for this visit.  Electronically signed by:  Lauretta Ponto, MSN, FNP-C, CUNP 08/29/2023 11:42 AM

## 2023-08-29 ENCOUNTER — Encounter: Payer: Self-pay | Admitting: Urology

## 2023-08-29 ENCOUNTER — Ambulatory Visit: Admitting: Urology

## 2023-08-29 VITALS — BP 134/80 | HR 66

## 2023-08-29 DIAGNOSIS — R319 Hematuria, unspecified: Secondary | ICD-10-CM

## 2023-08-29 DIAGNOSIS — R8290 Unspecified abnormal findings in urine: Secondary | ICD-10-CM

## 2023-08-29 DIAGNOSIS — R3129 Other microscopic hematuria: Secondary | ICD-10-CM | POA: Diagnosis not present

## 2023-08-29 DIAGNOSIS — R399 Unspecified symptoms and signs involving the genitourinary system: Secondary | ICD-10-CM | POA: Diagnosis not present

## 2023-08-29 LAB — URINALYSIS, ROUTINE W REFLEX MICROSCOPIC
Bilirubin, UA: NEGATIVE
Glucose, UA: NEGATIVE
Ketones, UA: NEGATIVE
Nitrite, UA: POSITIVE — AB
Specific Gravity, UA: 1.02 (ref 1.005–1.030)
Urobilinogen, Ur: 1 mg/dL (ref 0.2–1.0)
pH, UA: 6.5 (ref 5.0–7.5)

## 2023-08-29 LAB — BLADDER SCAN AMB NON-IMAGING: Scan Result: 0

## 2023-08-29 LAB — MICROSCOPIC EXAMINATION
RBC, Urine: >30 /HPF — AB (ref 0–2)
WBC, UA: >30 /HPF — AB (ref 0–5)

## 2023-08-29 MED ORDER — NITROFURANTOIN MONOHYD MACRO 100 MG PO CAPS: 100.0000 mg | ORAL_CAPSULE | Freq: Two times a day (BID) | ORAL | 0 refills | Status: AC

## 2023-09-01 LAB — URINE CULTURE

## 2023-09-03 ENCOUNTER — Ambulatory Visit: Admitting: Urology

## 2023-09-03 ENCOUNTER — Other Ambulatory Visit: Payer: Self-pay | Admitting: Urology

## 2023-09-03 ENCOUNTER — Ambulatory Visit: Payer: Self-pay | Admitting: Urology

## 2023-09-03 DIAGNOSIS — R399 Unspecified symptoms and signs involving the genitourinary system: Secondary | ICD-10-CM

## 2023-09-03 DIAGNOSIS — R8279 Other abnormal findings on microbiological examination of urine: Secondary | ICD-10-CM

## 2023-09-03 MED ORDER — SULFAMETHOXAZOLE-TRIMETHOPRIM 800-160 MG PO TABS: 1.0000 | ORAL_TABLET | Freq: Two times a day (BID) | ORAL | 0 refills | Status: DC

## 2023-09-03 NOTE — Progress Notes (Signed)
 Please let pt know urine culture was positive but resistant to the antibiotic class prescribed. Bactrim  being sent in today as alternative based on culture sensitivities.

## 2023-09-03 NOTE — Telephone Encounter (Signed)
 Patient was made aware and voiced understanding.

## 2023-09-03 NOTE — Telephone Encounter (Signed)
-----   Message from Lauraine JAYSON Oz sent at 09/03/2023  9:16 AM EDT ----- Please let pt know urine culture was positive but resistant to the antibiotic class prescribed. Bactrim being sent in today as alternative based on culture sensitivities.   ----- Message ----- From: Rebecka Memos Lab Results In Sent: 08/29/2023   3:36 PM EDT To: Sarah C Larocco, FNP

## 2023-09-10 ENCOUNTER — Encounter (INDEPENDENT_AMBULATORY_CARE_PROVIDER_SITE_OTHER): Payer: Self-pay | Admitting: Gastroenterology

## 2023-09-10 ENCOUNTER — Ambulatory Visit (INDEPENDENT_AMBULATORY_CARE_PROVIDER_SITE_OTHER): Admitting: Gastroenterology

## 2023-09-10 VITALS — BP 126/75 | HR 69 | Temp 98.3°F | Ht 62.0 in | Wt 134.8 lb

## 2023-09-10 DIAGNOSIS — K5909 Other constipation: Secondary | ICD-10-CM

## 2023-09-10 DIAGNOSIS — M9905 Segmental and somatic dysfunction of pelvic region: Secondary | ICD-10-CM

## 2023-09-10 DIAGNOSIS — Z800 Family history of malignant neoplasm of digestive organs: Secondary | ICD-10-CM | POA: Diagnosis not present

## 2023-09-10 DIAGNOSIS — K5900 Constipation, unspecified: Secondary | ICD-10-CM | POA: Diagnosis not present

## 2023-09-10 DIAGNOSIS — K629 Disease of anus and rectum, unspecified: Secondary | ICD-10-CM | POA: Insufficient documentation

## 2023-09-10 DIAGNOSIS — Z1211 Encounter for screening for malignant neoplasm of colon: Secondary | ICD-10-CM

## 2023-09-10 NOTE — Progress Notes (Signed)
 Referring Provider: Verdia Lombard, MD Primary Care Physician:  Verdia Lombard, MD Primary GI Physician: previously Dr. Golda (Dr. Cinderella)   Chief Complaint  Patient presents with   Colonoscopy    Has issues with constipation, states that it feels like she is always sitting on a tennis ball. Father and brother both passed from colon cancer.   HPI:   Cynthia Cochran is a 73 y.o. female with past medical history of anxiety, arthritis, dysphagia, GERD, gastric ulcer, high cholesterol, HLD, spinal stenosis   Patient presenting today for:  Constipation, anorectal dyssynergia and to schedule screening colonoscopy in high risk patient   States she has long history of issues with constipation due to issues with her vaginal/rectal walls. She has been seen at [facility] and had numerous testing without improvement. She has seen pelvic floor physical therapy in the past, she has seen surgery,urogynecology and even neurosurgery regarding her pelvic floor issues, constipation and sensation of sitting on a ball. She saw Dr. Teresa in January 2024 and had MR defecography completed on 04/04/2022 which showed pelvic floor laxity with mild cystocele, urethral hypermobility, and a evident rectocele. Unclear if this is actually contributing to her symptoms as a lot of her symptoms sound like they could be more related to dyssynergy defecation. She does have evidence of incomplete rectal emptying/evacuation on her MR defecography. She was referred to biofeedback therapy with Channing Pereyra and seen by Urogynecology due to rectocele. She has also had pudenal nerve blocks, pelvic floor injections for perineal pain without improvement. Plans for urethral bulking with Dr. Marilynne in the near future.   Currently She is taking 1 capful of miralax per day, on average going 2-3 days without a BM and will have more bloating and worsening sensation of sitting on a tennis ball. She will take dulcolax which usually produce a  good BM. She previously was doing miralax and metamucil together in the past but unsure how well this worked for her. She does endorse severe UTI she is currently on antibiotics for. Water  intake is good. Has not tried higher dosage of miralax.   No red flag symptoms. Patient denies melena, hematochezia, nausea, vomiting, diarrhea, dysphagia, odyonophagia, early satiety or weight loss.   02/21/2021. Anorectal manometry: She developed rectal discomfort with balloon distention to 200 mL suggesting rectal hypersensitivity. She did not have any findings suggestive of pelvic floor dyssynergia. Patient was started on low-dose amitriptyline   Flex sig 01/2021 - Extyernal hemorrhoids and anal papille otherwise                            normal examination.                           - No specimens collected.                           comment: no evidence of anal fissure. Last Colonoscopy: 09/2018 diverticulosis, hemorrhoids, anal papilla  Last Endoscopy:2018   Filed Weights   09/10/23 1153  Weight: 134 lb 12.8 oz (61.1 kg)     Past Medical History:  Diagnosis Date   Anxiety    Arthritis    Blood transfusion without reported diagnosis 1982   after c section   Dysphagia 04/25/2016   Gastric ulcer    GERD (gastroesophageal reflux disease)    Heart murmur    High cholesterol  04/25/2016   Hyperlipidemia    Mixed stress and urge urinary incontinence 09/09/2015   Sciatic nerve pain    Spinal stenosis     Past Surgical History:  Procedure Laterality Date   BIOPSY  05/12/2016   Procedure: BIOPSY;  Surgeon: Claudis RAYMOND Rivet, MD;  [facility];  Service: Endoscopy;;  gastric   BIOPSY  10/04/2018   Procedure: BIOPSY;  Surgeon: Rivet Claudis RAYMOND, MD;  [facility];  Service: Endoscopy;;  gastric    CARPAL TUNNEL RELEASE     right; 1998, left 1998   CESAREAN SECTION  1982   COLONOSCOPY WITH PROPOFOL  N/A 10/04/2018   Procedure: COLONOSCOPY WITH PROPOFOL ;  Surgeon: Rivet Claudis RAYMOND,  MD;  [facility];  Service: Endoscopy;  Laterality: N/A;  7:30   Colonscopy     ESOPHAGEAL DILATION N/A 05/12/2016   Procedure: ESOPHAGEAL DILATION;  Surgeon: Claudis RAYMOND Rivet, MD;  [facility];  Service: Endoscopy;  Laterality: N/A;   ESOPHAGOGASTRODUODENOSCOPY N/A 05/12/2016   Procedure: ESOPHAGOGASTRODUODENOSCOPY (EGD);  Surgeon: Claudis RAYMOND Rivet, MD;  [facility];  Service: Endoscopy;  Laterality: N/A;  9:30   ESOPHAGOGASTRODUODENOSCOPY (EGD) WITH PROPOFOL  N/A 10/04/2018   Procedure: ESOPHAGOGASTRODUODENOSCOPY (EGD) WITH PROPOFOL ;  Surgeon: Rivet Claudis RAYMOND, MD;  [facility];  Service: Endoscopy;  Laterality: N/A;   EYE SURGERY     lasix   FLEXIBLE SIGMOIDOSCOPY N/A 01/28/2021   Procedure: FLEXIBLE SIGMOIDOSCOPY WITH PROPOFOL ;  Surgeon: Rivet Claudis RAYMOND, MD;  [facility];  Service: Endoscopy;  Laterality: N/A;  9:30   JOINT REPLACEMENT     both hips   LUMBAR LAMINECTOMY/DECOMPRESSION MICRODISCECTOMY N/A 08/05/2020   Procedure: LAMINECTOMY THORACIC TEN-ELEVEN;  Surgeon: Gillie Duncans, MD;  Location: [facility];  Service: Neurosurgery;  Laterality: N/A;   LUMBAR LAMINECTOMY/DECOMPRESSION MICRODISCECTOMY N/A 10/14/2020   Procedure: Right Lumbar 4-5, Lumbar 5 Sacral 1 Lumbar and thoracic laminectomy;  Surgeon: Gillie Duncans, MD;  Location: [facility];  Service: Neurosurgery;  Laterality: N/A;  Right Lumbar 4-5, Lumbar 5 Sacral 1 Lumbar and thoracic laminectomy   TOTAL HIP ARTHROPLASTY     right; March 2013; left March 2011    Current Outpatient Medications  Medication Sig Dispense Refill   acetaminophen  (TYLENOL ) 650 MG CR tablet Take 1,300 mg by mouth every 8 (eight) hours as needed for pain.     atorvastatin  (LIPITOR ) 80 MG tablet Take 80 mg by mouth daily.     DULoxetine  (CYMBALTA ) 60 MG capsule Take 60 mg by mouth daily.     estradiol  (ESTRACE ) 0.1 MG/GM vaginal cream Place 0.5 g vaginally 2 (two) times a week. Place 0.5g nightly for two weeks  then twice a week after 42.5 g 11   Evolocumab  (REPATHA  SURECLICK) 140 MG/ML SOAJ Inject 140 mg into the skin every 14 (fourteen) days. 6 mL 3   hydrochlorothiazide (MICROZIDE) 12.5 MG capsule Take 12.5 mg by mouth daily.     losartan  (COZAAR ) 100 MG tablet Take 100 mg by mouth daily.     polyethylene glycol (MIRALAX / GLYCOLAX) 17 g packet Take 17 g by mouth daily. One capful daily in coffee     Psyllium (METAMUCIL PO) Take 1 Dose by mouth daily. 1 dose = 2 teaspoons     No current facility-administered medications for this visit.    Allergies as of 09/10/2023 - Review Complete 09/10/2023  Allergen Reaction Noted   Lyrica [pregabalin] Other (See Comments) 11/04/2018   Mobic [meloxicam] Other (See  Comments) 09/25/2018   Nsaids Other (See Comments) 05/24/2018    Social History   Socioeconomic History   Marital status: Widowed    Spouse name: Not on file   Number of children: 0   Years of education: some college   Highest education level: Not on file  Occupational History   Occupation: Retired  Tobacco Use   Smoking status: Never   Smokeless tobacco: Never  Vaping Use   Vaping status: Never Used  Substance and Sexual Activity   Alcohol use: Yes    Comment: social drinking - a glass of wine about every 2-3 weeka   Drug use: No   Sexual activity: Not Currently    Birth control/protection: Post-menopausal, Abstinence  Other Topics Concern   Not on file  Social History Narrative   Lives alone   Caffeine use: no more than 2 cups per day    16 oz soda per day   Right handed    Social Drivers of Health   Financial Resource Strain: Low Risk  (04/15/2021)   Overall Financial Resource Strain (CARDIA)    Difficulty of Paying Living Expenses: Not hard at all  Food Insecurity: No Food Insecurity (04/15/2021)   Hunger Vital Sign    Worried About Running Out of Food in the Last Year: Never true    Ran Out of Food in the Last Year: Never true  Transportation Needs: No Transportation  Needs (04/15/2021)   PRAPARE - Administrator, Civil Service (Medical): No    Lack of Transportation (Non-Medical): No  Physical Activity: Insufficiently Active (04/15/2021)   Exercise Vital Sign    Days of Exercise per Week: 3 days    Minutes of Exercise per Session: 20 min  Stress: No Stress Concern Present (04/15/2021)   [employer] of Occupational Health - Occupational Stress Questionnaire    Feeling of Stress : Not at all  Social Connections: Unknown (07/26/2021)   Received from [facility]   Social Network    Social Network: Not on file    Review of systems General: negative for malaise, night sweats, fever, chills, weight loss Neck: Negative for lumps, goiter, pain and significant neck swelling Resp: Negative for cough, wheezing, dyspnea at rest CV: Negative for chest pain, leg swelling, palpitations, orthopnea GI: denies melena, hematochezia, nausea, vomiting, diarrhea, dysphagia, odyonophagia, early satiety or unintentional weight loss. +constipation  MSK: Negative for joint pain or swelling, back pain, and muscle pain. Derm: Negative for itching or rash Psych: Denies depression, anxiety, memory loss, confusion. No homicidal or suicidal ideation.  Heme: Negative for prolonged bleeding, bruising easily, and swollen nodes. Endocrine: Negative for cold or heat intolerance, polyuria, polydipsia and goiter. Neuro: negative for tremor, gait imbalance, syncope and seizures. The remainder of the review of systems is noncontributory.  Physical Exam: BP 126/75 (BP Location: Right Arm, Patient Position: Sitting, Cuff Size: Normal)   Pulse 69   Temp 98.3 F (36.8 C) (Oral)   Ht 5' 2 (1.575 m)   Wt 134 lb 12.8 oz (61.1 kg)   SpO2 99%   BMI 24.66 kg/m  General:   Alert and oriented. No distress noted. Pleasant and cooperative.  Head:  Normocephalic and atraumatic. Eyes:  Conjuctiva clear without scleral icterus. Mouth:  Oral mucosa pink and moist. Good dentition.  No lesions. Heart: Normal rate and rhythm, s1 and s2 heart sounds present.  Lungs: Clear lung sounds in all lobes. Respirations equal and unlabored. Abdomen:  +BS, soft, non-tender and non-distended.  No rebound or guarding. No HSM or masses noted. Derm: No palmar erythema or jaundice Msk:  Symmetrical without gross deformities. Normal posture. Extremities:  Without edema. Neurologic:  Alert and  oriented x4 Psych:  Alert and cooperative. Normal mood and affect.  Invalid input(s): 6 MONTHS   ASSESSMENT: BRIANNIA LABA is a 73 y.o. female presenting today for follow up of constipation, anorectal dyssynergia and to schedule colonoscopy due to family history of CRC.  Patient with extensive workup/therapies as above for anorectal dyssynergia/pelvic floor dysfunction, currently under the care of urogynecology and has seen Dr. Teresa with [facility] in the past as well. She continues to have constipation, on miralax 1 capful per day. Did note have much improvement with pelvic floor PT in the past. Often has to strain, going 2-3 days without a BM. At this time, recommend increasing miralax, if she does not have improvement with this, will consider trulance/amitiza to better manage her constipation  Last colonoscopy in 2020, family history significant for CRC In brother and father. She is due for TCS. Indications, risks and benefits of procedure discussed in detail with patient. Patient verbalized understanding and is in agreement to proceed with Colonoscopy.    PLAN:  -increase miralax to BID x1 week, increase to TID if no improvement in symptoms thereafter -consider Trulance or amitiza if no improvement on miralax -Increase water  intake, aim for atleast 64 oz per day -Increase fruits, veggies and whole grains, kiwi and prunes are especially good for constipation -schedule colonoscopy ASA II  -continue to follow with urogynecology/Dr. Teresa  All questions were answered, patient verbalized understanding  and is in agreement with plan as outlined above.   Follow Up: 4 months  Ripley Bogosian L. Khambrel Amsden, MSN, APRN, AGNP-C Adult-Gerontology Nurse Practitioner [facility]

## 2023-09-10 NOTE — Patient Instructions (Addendum)
-  increase miralax to two capfuls daily x1 week, increase to three capfuls per day if no improvement in symptoms thereafter -let me know if constipation is not improving  -Increase water  intake, aim for atleast 64 oz per day -Increase fruits, veggies and whole grains, kiwi and prunes are especially good for constipation -schedule colonoscopy  Follow up 4 months  It was a pleasure to see you today. I want to create trusting relationships with patients and provide genuine, compassionate, and quality care. I truly value your feedback! please be on the lookout for a survey regarding your visit with me today. I appreciate your input about our visit and your time in completing this!    Corie Vavra L. Brantley Wiley, MSN, APRN, AGNP-C Adult-Gerontology Nurse Practitioner [facility]

## 2023-09-19 ENCOUNTER — Telehealth: Payer: Self-pay | Admitting: *Deleted

## 2023-09-19 DIAGNOSIS — Z1211 Encounter for screening for malignant neoplasm of colon: Secondary | ICD-10-CM

## 2023-09-19 MED ORDER — PEG 3350-KCL-NA BICARB-NACL 420 G PO SOLR: 4000.0000 mL | Freq: Once | ORAL | 0 refills | Status: AC

## 2023-09-19 NOTE — Telephone Encounter (Signed)
 Called pt. Scheduled for TCS with Dr. Cinderella, ASA 2 8/13. Aware will send instructions to her. Rx for prep to the pharmacy and aware needs lab work before procedure. Orders entered.

## 2023-09-20 ENCOUNTER — Other Ambulatory Visit: Payer: Self-pay

## 2023-09-20 ENCOUNTER — Other Ambulatory Visit

## 2023-09-20 DIAGNOSIS — R399 Unspecified symptoms and signs involving the genitourinary system: Secondary | ICD-10-CM

## 2023-09-20 LAB — MICROSCOPIC EXAMINATION: Bacteria, UA: NONE SEEN

## 2023-09-20 LAB — URINALYSIS, ROUTINE W REFLEX MICROSCOPIC
Bilirubin, UA: NEGATIVE
Glucose, UA: NEGATIVE
Ketones, UA: NEGATIVE
Nitrite, UA: NEGATIVE
Protein,UA: NEGATIVE
RBC, UA: NEGATIVE
Specific Gravity, UA: <1.005 — ABNORMAL LOW (ref 1.005–1.030)
Urobilinogen, Ur: 0.2 mg/dL (ref 0.2–1.0)
pH, UA: 6.5 (ref 5.0–7.5)

## 2023-09-25 ENCOUNTER — Telehealth: Payer: Self-pay

## 2023-09-25 ENCOUNTER — Ambulatory Visit: Payer: Self-pay

## 2023-09-25 ENCOUNTER — Other Ambulatory Visit: Payer: Self-pay

## 2023-09-25 DIAGNOSIS — R399 Unspecified symptoms and signs involving the genitourinary system: Secondary | ICD-10-CM

## 2023-09-25 NOTE — Telephone Encounter (Signed)
 Patient left a voice message. Needing results of UA. Needing to discuss continued UTI symptoms of burning.  Please advise.  Call:  [phone]

## 2023-09-25 NOTE — Telephone Encounter (Signed)
 Dysuria  Patient called with c/o dysuria x 2-3 days days.  Pain: burning  Severity:5/10  Associated Signs and Symptoms:  Fever: noTemp. But states she is having sweats again as she did previously.  Chills: no Hematuria: no Urgency: no Frequency: yes Hesitancy:no Incontinence: no Nausea: no Vomiting: no  Urologic History:  Any Recent Urologic Surgeries or Procedures:no Recurrent UTI's:yes reports she had a positive urine culture on 6/18 treated with second antibiotic but was only given 12 of the 14 tablets from pharmacy. Cystitis: no  Prostatitis:n/a Kidney or Bladder Stones: no Plan: Walk-in Clinic: no Appointment w/Physician: [no Lab visit scheduled for urine drop off: No Advice given: sent to NP to decide if repeat UA is needed due to return of symptoms and patient only had 12 of 14 tablets to take for recent UTI  Do you take on daily medications for UTI suppression No

## 2023-09-25 NOTE — Telephone Encounter (Signed)
Responded via telephone call

## 2023-09-26 ENCOUNTER — Other Ambulatory Visit

## 2023-09-26 DIAGNOSIS — R399 Unspecified symptoms and signs involving the genitourinary system: Secondary | ICD-10-CM

## 2023-09-26 LAB — URINALYSIS, ROUTINE W REFLEX MICROSCOPIC
Bilirubin, UA: NEGATIVE
Glucose, UA: NEGATIVE
Ketones, UA: NEGATIVE
Leukocytes,UA: NEGATIVE
Nitrite, UA: NEGATIVE
RBC, UA: NEGATIVE
Specific Gravity, UA: 1.03 (ref 1.005–1.030)
Urobilinogen, Ur: 4 mg/dL — ABNORMAL HIGH (ref 0.2–1.0)
pH, UA: 6 (ref 5.0–7.5)

## 2023-09-27 ENCOUNTER — Encounter: Payer: Self-pay | Admitting: Obstetrics and Gynecology

## 2023-09-27 ENCOUNTER — Ambulatory Visit: Admitting: Obstetrics and Gynecology

## 2023-09-27 VITALS — BP 112/67 | HR 73

## 2023-09-27 DIAGNOSIS — N393 Stress incontinence (female) (male): Secondary | ICD-10-CM

## 2023-09-27 LAB — POCT URINALYSIS DIP (CLINITEK)
Bilirubin, UA: NEGATIVE
Blood, UA: NEGATIVE
Glucose, UA: NEGATIVE mg/dL
Ketones, POC UA: NEGATIVE mg/dL
Nitrite, UA: NEGATIVE
POC PROTEIN,UA: NEGATIVE
Spec Grav, UA: 1.025 (ref 1.010–1.025)
Urobilinogen, UA: 2 U/dL — AB
pH, UA: 6.5 (ref 5.0–8.0)

## 2023-09-27 MED ORDER — CIPROFLOXACIN HCL 500 MG PO TABS
500.0000 mg | ORAL_TABLET | Freq: Once | ORAL | Status: AC
Start: 2023-09-27 — End: 2023-09-27
  Administered 2023-09-27: 500 mg via ORAL

## 2023-09-27 MED ORDER — LIDOCAINE-EPINEPHRINE 1 %-1:100000 IJ SOLN
6.0000 mL | Freq: Once | INTRAMUSCULAR | Status: AC
  Administered 2023-09-27: 6 mL

## 2023-09-27 MED ORDER — LIDOCAINE HCL URETHRAL/MUCOSAL 2 % EX GEL
1.0000 | Freq: Once | CUTANEOUS | Status: AC
  Administered 2023-09-27: 1 via URETHRAL

## 2023-09-27 NOTE — Progress Notes (Signed)
 Bulkamid Injection  CC: 73 y.o. y.o. F with stress incontinence who presents for transurethral Bulkamid injection.  Patient signed her consent form.  She started antibiotic prophylaxis today.  Today's Vitals   09/27/23 0903  BP: 112/67  Pulse: 73    Results for orders placed or performed in visit on 09/25/23 (from the past 24 hours)  Urinalysis, Routine w reflex microscopic     Status: Abnormal   Collection Time: 09/26/23 10:44 AM  Result Value Ref Range   Specific Gravity, UA 1.030 1.005 - 1.030   pH, UA 6.0 5.0 - 7.5   Color, UA Yellow Yellow   Appearance Ur Clear Clear   Leukocytes,UA Negative Negative   Protein,UA Trace Negative/Trace   Glucose, UA Negative Negative   Ketones, UA Negative Negative   RBC, UA Negative Negative   Bilirubin, UA Negative Negative   Urobilinogen, Ur 4.0 (H) 0.2 - 1.0 mg/dL   Nitrite, UA Negative Negative   Microscopic Examination Comment    Narrative   Performed at:  [address] - [facility] [address] [place], [place]  [id number] Lab Director: Cherlyn Ore MT, Phone:  [phone]    Procedure: Time out was performed. The bladder was catheterized and 10 ml of 2% lidocaine  jelly placed in the urethra. A urethral block was performed by injecting 3ml of 1% lidocaine  with epinephrine  at 3 and 9 o'clock adjacent to the urethra.  The needle was primed.  The cystoscope was inserted to the level of the bladder neck.  The needle was inserted 2 cm and the scope was pulled back into the urethra 2 cm.  The needle was inserted bevel up at the 5 o'clock position and the Bulkamid was injected to obtain coaptation.  This was repeated at the 2 o'clock,  10 o'clock and 7 o'clock positions.   A total of 2- 1ml syringes were used and good circumferential coaptation was noted.  The patient tolerated the procedure well. She was asked to void after the procedure.  Post-Void Residual (PVR) by Bladder Scan: In order to evaluate bladder emptying, we discussed  obtaining a postvoid residual and she agreed to this procedure.  Procedure: The ultrasound unit was placed on the patient's abdomen in the suprapubic region after the patient had voided. A PVR of 3 ml was obtained by bladder scan.     ASSESSMENT: 73 y.o. y.o. s/p transurethral Bulkamid injection for stress incontinence.   PLAN: Patient will follow up in 4 weeks to reassess. Voiding and post-procedure precautions were given. She will return for heavy bleeding, fevers, dysuria lasting beyond today and incomplete emptying.  All questions were answered.  Rosaline LOISE Caper, MD

## 2023-09-27 NOTE — Patient Instructions (Signed)

## 2023-09-27 NOTE — Addendum Note (Signed)
 Addended by: Ely Ballen N on: 09/27/2023 11:49 AM   Modules accepted: Orders

## 2023-09-29 LAB — URINE CULTURE

## 2023-10-01 ENCOUNTER — Other Ambulatory Visit: Payer: Self-pay | Admitting: Urology

## 2023-10-01 ENCOUNTER — Ambulatory Visit: Payer: Self-pay | Admitting: Urology

## 2023-10-01 DIAGNOSIS — N390 Urinary tract infection, site not specified: Secondary | ICD-10-CM

## 2023-10-01 MED ORDER — DOXYCYCLINE HYCLATE 100 MG PO CAPS: 100.0000 mg | ORAL_CAPSULE | Freq: Two times a day (BID) | ORAL | 0 refills | Status: DC

## 2023-10-01 NOTE — Progress Notes (Signed)
 Please let patient know urine culture was positive; sending in antibiotic today (Doxycycline ).

## 2023-10-19 ENCOUNTER — Other Ambulatory Visit (HOSPITAL_COMMUNITY)
Admission: RE | Admit: 2023-10-19 | Discharge: 2023-10-19 | Disposition: A | Discharge status: Home / Self Care | Source: Ambulatory Visit | Attending: Gastroenterology | Admitting: Gastroenterology

## 2023-10-19 DIAGNOSIS — Z1211 Encounter for screening for malignant neoplasm of colon: Secondary | ICD-10-CM | POA: Diagnosis present

## 2023-10-19 DIAGNOSIS — Z800 Family history of malignant neoplasm of digestive organs: Secondary | ICD-10-CM | POA: Insufficient documentation

## 2023-10-19 LAB — BASIC METABOLIC PANEL WITH GFR
Anion gap: 8 (ref 5–15)
BUN: 11 mg/dL (ref 8–23)
CO2: 29 mmol/L (ref 22–32)
Calcium: 8.8 mg/dL — ABNORMAL LOW (ref 8.9–10.3)
Chloride: 104 mmol/L (ref 98–111)
Creatinine, Ser: 0.71 mg/dL (ref 0.44–1.00)
GFR, Estimated: >60 mL/min (ref >60)
Glucose, Bld: 92 mg/dL (ref 70–99)
Potassium: 3.8 mmol/L (ref 3.5–5.1)
Sodium: 141 mmol/L (ref 135–145)

## 2023-10-23 NOTE — Anesthesia Preprocedure Evaluation (Addendum)
 Anesthesia Evaluation  Patient identified by MRN, date of birth, ID band Patient awake    Reviewed: Allergy & Precautions, H&P , NPO status , Patient's Chart, lab work & pertinent test results, reviewed documented beta blocker date and time   Airway Mallampati: II  TM Distance: >3 FB Neck ROM: full    Dental no notable dental hx. (+) Dental Advisory Given, Teeth Intact   Pulmonary neg pulmonary ROS   Pulmonary exam normal breath sounds clear to auscultation       Cardiovascular Exercise Tolerance: Good hypertension, Normal cardiovascular exam+ Valvular Problems/Murmurs  Rhythm:regular Rate:Normal  Heart murmur. Mild AS. Good EF withh grade 1 diastolic dysfunction   Neuro/Psych  PSYCHIATRIC DISORDERS Anxiety Depression    Spinal stenosis  Neuromuscular disease    GI/Hepatic Neg liver ROS, PUD,GERD  Medicated,,dysphagia   Endo/Other  negative endocrine ROS    Renal/GU negative Renal ROS  negative genitourinary   Musculoskeletal   Abdominal   Peds  Hematology  (+) Blood dyscrasia, anemia   Anesthesia Other Findings   Reproductive/Obstetrics negative OB ROS                              Anesthesia Physical Anesthesia Plan  ASA: 2  Anesthesia Plan: General   Post-op Pain Management: Minimal or no pain anticipated   Induction: Intravenous  PONV Risk Score and Plan: Propofol  infusion  Airway Management Planned: Nasal Cannula and Natural Airway  Additional Equipment: None  Intra-op Plan:   Post-operative Plan:   Informed Consent: I have reviewed the patients History and Physical, chart, labs and discussed the procedure including the risks, benefits and alternatives for the proposed anesthesia with the patient or authorized representative who has indicated his/her understanding and acceptance.     Dental Advisory Given  Plan Discussed with: CRNA  Anesthesia Plan Comments:           Anesthesia Quick Evaluation

## 2023-10-24 ENCOUNTER — Encounter (HOSPITAL_COMMUNITY)
Admission: RE | Disposition: A | Discharge status: Home / Self Care | Payer: Self-pay | Source: Home / Self Care | Attending: Gastroenterology

## 2023-10-24 ENCOUNTER — Encounter (HOSPITAL_COMMUNITY): Payer: Self-pay | Admitting: Gastroenterology

## 2023-10-24 ENCOUNTER — Ambulatory Visit (HOSPITAL_COMMUNITY)
Admission: RE | Admit: 2023-10-24 | Discharge: 2023-10-24 | Disposition: A | Discharge status: Home / Self Care | Attending: Gastroenterology | Admitting: Gastroenterology

## 2023-10-24 ENCOUNTER — Other Ambulatory Visit: Payer: Self-pay

## 2023-10-24 ENCOUNTER — Encounter (INDEPENDENT_AMBULATORY_CARE_PROVIDER_SITE_OTHER): Payer: Self-pay | Admitting: *Deleted

## 2023-10-24 ENCOUNTER — Ambulatory Visit (HOSPITAL_COMMUNITY): Admitting: Anesthesiology

## 2023-10-24 ENCOUNTER — Ambulatory Visit (HOSPITAL_BASED_OUTPATIENT_CLINIC_OR_DEPARTMENT_OTHER): Admitting: Anesthesiology

## 2023-10-24 DIAGNOSIS — I10 Essential (primary) hypertension: Secondary | ICD-10-CM | POA: Insufficient documentation

## 2023-10-24 DIAGNOSIS — K648 Other hemorrhoids: Secondary | ICD-10-CM

## 2023-10-24 DIAGNOSIS — Z8711 Personal history of peptic ulcer disease: Secondary | ICD-10-CM | POA: Diagnosis not present

## 2023-10-24 DIAGNOSIS — F32A Depression, unspecified: Secondary | ICD-10-CM | POA: Diagnosis not present

## 2023-10-24 DIAGNOSIS — Z800 Family history of malignant neoplasm of digestive organs: Secondary | ICD-10-CM | POA: Diagnosis present

## 2023-10-24 DIAGNOSIS — K219 Gastro-esophageal reflux disease without esophagitis: Secondary | ICD-10-CM | POA: Diagnosis not present

## 2023-10-24 DIAGNOSIS — Z1211 Encounter for screening for malignant neoplasm of colon: Secondary | ICD-10-CM | POA: Diagnosis not present

## 2023-10-24 DIAGNOSIS — G709 Myoneural disorder, unspecified: Secondary | ICD-10-CM | POA: Diagnosis not present

## 2023-10-24 DIAGNOSIS — K5730 Diverticulosis of large intestine without perforation or abscess without bleeding: Secondary | ICD-10-CM

## 2023-10-24 DIAGNOSIS — Z8249 Family history of ischemic heart disease and other diseases of the circulatory system: Secondary | ICD-10-CM | POA: Diagnosis not present

## 2023-10-24 DIAGNOSIS — R1310 Dysphagia, unspecified: Secondary | ICD-10-CM | POA: Insufficient documentation

## 2023-10-24 DIAGNOSIS — M4800 Spinal stenosis, site unspecified: Secondary | ICD-10-CM | POA: Diagnosis not present

## 2023-10-24 DIAGNOSIS — F419 Anxiety disorder, unspecified: Secondary | ICD-10-CM | POA: Insufficient documentation

## 2023-10-24 HISTORY — PX: COLONOSCOPY: SHX5424

## 2023-10-24 HISTORY — DX: Essential (primary) hypertension: I10

## 2023-10-24 LAB — HM COLONOSCOPY

## 2023-10-24 SURGERY — COLONOSCOPY
Anesthesia: General

## 2023-10-24 MED ORDER — EPHEDRINE SULFATE-NACL 50-0.9 MG/10ML-% IV SOSY
PREFILLED_SYRINGE | INTRAVENOUS | Status: DC | PRN
  Administered 2023-10-24 (×6): 5 mg via INTRAVENOUS

## 2023-10-24 MED ORDER — LACTATED RINGERS IV SOLN: INTRAVENOUS | Status: DC

## 2023-10-24 MED ORDER — LIDOCAINE 2% (20 MG/ML) 5 ML SYRINGE
INTRAMUSCULAR | Status: DC | PRN
  Administered 2023-10-24 (×2): 100 mg via INTRAVENOUS

## 2023-10-24 MED ORDER — PROPOFOL 10 MG/ML IV BOLUS
INTRAVENOUS | Status: DC | PRN
  Administered 2023-10-24 (×2): 100 mg via INTRAVENOUS

## 2023-10-24 MED ORDER — PROPOFOL 500 MG/50ML IV EMUL
INTRAVENOUS | Status: DC | PRN
  Administered 2023-10-24 (×2): 150 ug/kg/min via INTRAVENOUS

## 2023-10-24 NOTE — H&P (Signed)
 Primary Care Physician:  Verdia Lombard, MD Primary Gastroenterologist:  Dr. Cinderella  Pre-Procedure History & Physical: HPI:  Cynthia Cochran is a 73 y.o. female is here for a colonoscopy for colon cancer screening purposes.    Last colonoscopy in 2020, family history significant for CRC In brother and father.   No melena or hematochezia.  No abdominal pain or unintentional weight loss.  No change in bowel habits.  Overall feels well from a GI standpoint.  Past Medical History:  Diagnosis Date   Anxiety    Arthritis    Blood transfusion without reported diagnosis 1982   after c section   Dysphagia 04/25/2016   Gastric ulcer    GERD (gastroesophageal reflux disease)    Heart murmur    High cholesterol 04/25/2016   Hyperlipidemia    Hypertension    Mixed stress and urge urinary incontinence 09/09/2015   Sciatic nerve pain    Spinal stenosis     Past Surgical History:  Procedure Laterality Date   BIOPSY  05/12/2016   Procedure: BIOPSY;  Surgeon: Claudis RAYMOND Rivet, MD;  [facility];  Service: Endoscopy;;  gastric   BIOPSY  10/04/2018   Procedure: BIOPSY;  Surgeon: Rivet Claudis RAYMOND, MD;  [facility]: [facility];  Service: Endoscopy;;  gastric    CARPAL TUNNEL RELEASE     right; 1998, left 1998   CESAREAN SECTION  1982   COLONOSCOPY WITH PROPOFOL  N/A 10/04/2018   Procedure: COLONOSCOPY WITH PROPOFOL ;  Surgeon: Rivet Claudis RAYMOND, MD;  [facility]: [facility];  Service: Endoscopy;  Laterality: N/A;  7:30   Colonscopy     ESOPHAGEAL DILATION N/A 05/12/2016   Procedure: ESOPHAGEAL DILATION;  Surgeon: Claudis RAYMOND Rivet, MD;  [facility];  Service: Endoscopy;  Laterality: N/A;   ESOPHAGOGASTRODUODENOSCOPY N/A 05/12/2016   Procedure: ESOPHAGOGASTRODUODENOSCOPY (EGD);  Surgeon: Claudis RAYMOND Rivet, MD;  [facility];  Service: Endoscopy;  Laterality: N/A;  9:30   ESOPHAGOGASTRODUODENOSCOPY (EGD) WITH PROPOFOL  N/A 10/04/2018   Procedure: ESOPHAGOGASTRODUODENOSCOPY  (EGD) WITH PROPOFOL ;  Surgeon: Rivet Claudis RAYMOND, MD;  [facility]: [facility];  Service: Endoscopy;  Laterality: N/A;   EYE SURGERY     lasix   FLEXIBLE SIGMOIDOSCOPY N/A 01/28/2021   Procedure: FLEXIBLE SIGMOIDOSCOPY WITH PROPOFOL ;  Surgeon: Rivet Claudis RAYMOND, MD;  Location: [facility];  Service: Endoscopy;  Laterality: N/A;  9:30   JOINT REPLACEMENT     both hips   LUMBAR LAMINECTOMY/DECOMPRESSION MICRODISCECTOMY N/A 08/05/2020   Procedure: LAMINECTOMY THORACIC TEN-ELEVEN;  Surgeon: Gillie Duncans, MD;  Location: [facility];  Service: Neurosurgery;  Laterality: N/A;   LUMBAR LAMINECTOMY/DECOMPRESSION MICRODISCECTOMY N/A 10/14/2020   Procedure: Right Lumbar 4-5, Lumbar 5 Sacral 1 Lumbar and thoracic laminectomy;  Surgeon: Gillie Duncans, MD;  Location: [facility];  Service: Neurosurgery;  Laterality: N/A;  Right Lumbar 4-5, Lumbar 5 Sacral 1 Lumbar and thoracic laminectomy   TOTAL HIP ARTHROPLASTY     right; March 2013; left March 2011    Prior to Admission medications   Medication Sig Start Date End Date Taking? Authorizing Provider  atorvastatin  (LIPITOR ) 80 MG tablet Take 80 mg by mouth daily.   Yes [provider]  doxycycline  (VIBRAMYCIN ) 100 MG capsule Take 1 capsule (100 mg total) by mouth every 12 (twelve) hours. 10/01/23  Yes Gerldine Lauraine BROCKS, FNP  DULoxetine  (CYMBALTA ) 60 MG capsule Take 60 mg by mouth daily.   Yes [provider]  estradiol  (ESTRACE ) 0.1 MG/GM vaginal cream Place 0.5  g vaginally 2 (two) times a week. Place 0.5g nightly for two weeks then twice a week after 07/06/22  Yes Zuleta, Kaitlin G, NP  Evolocumab  (REPATHA  SURECLICK) 140 MG/ML SOAJ Inject 140 mg into the skin every 14 (fourteen) days. 03/13/23  Yes Delford Maude BROCKS, MD  GEMTESA  75 MG TABS Take 1 tablet by mouth daily. 09/26/23  Yes [provider]  hydrochlorothiazide (MICROZIDE) 12.5 MG capsule Take 12.5 mg by mouth daily. 07/31/22  Yes [provider]  losartan  (COZAAR ) 100 MG tablet Take  100 mg by mouth daily.   Yes [provider]  polyethylene glycol (MIRALAX / GLYCOLAX) 17 g packet Take 17 g by mouth daily. One capful daily in coffee   Yes [provider]  acetaminophen  (TYLENOL ) 650 MG CR tablet Take 1,300 mg by mouth every 8 (eight) hours as needed for pain.    [provider]  GAVILYTE-N WITH FLAVOR PACK 420 g solution Take 4,000 mLs by mouth once. Patient not taking: Reported on 09/27/2023 09/19/23   [provider]    Allergies as of 09/19/2023 - Review Complete 09/10/2023  Allergen Reaction Noted   Lyrica [pregabalin] Other (See Comments) 11/04/2018   Mobic [meloxicam] Other (See Comments) 09/25/2018   Nsaids Other (See Comments) 05/24/2018    Family History  Problem Relation Age of Onset   Other Mother        heavy smoker; died from arterial sclerosis   Colon cancer Father 60   Hypertension Sister    Colon cancer Brother 3   Stomach cancer Neg Hx    Breast cancer Neg Hx     Social History   Socioeconomic History   Marital status: Widowed    Spouse name: Not on file   Number of children: 0   Years of education: some college   Highest education level: Not on file  Occupational History   Occupation: Retired  Tobacco Use   Smoking status: Never   Smokeless tobacco: Never  Vaping Use   Vaping status: Never Used  Substance and Sexual Activity   Alcohol use: Yes    Comment: social drinking - a glass of wine about every 2-3 weeka   Drug use: No   Sexual activity: Not Currently    Birth control/protection: Post-menopausal, Abstinence  Other Topics Concern   Not on file  Social History Narrative   Lives alone   Caffeine use: no more than 2 cups per day    16 oz soda per day   Right handed    Social Drivers of Health   Financial Resource Strain: Low Risk  (04/15/2021)   Overall Financial Resource Strain (CARDIA)    Difficulty of Paying Living Expenses: Not hard at all  Food Insecurity: No Food Insecurity  (04/15/2021)   Hunger Vital Sign    Worried About Running Out of Food in the Last Year: Never true    Ran Out of Food in the Last Year: Never true  Transportation Needs: No Transportation Needs (04/15/2021)   PRAPARE - Administrator, Civil Service (Medical): No    Lack of Transportation (Non-Medical): No  Physical Activity: Insufficiently Active (04/15/2021)   Exercise Vital Sign    Days of Exercise per Week: 3 days    Minutes of Exercise per Session: 20 min  Stress: No Stress Concern Present (04/15/2021)   [employer] of Occupational Health - Occupational Stress Questionnaire    Feeling of Stress : Not at all  Social Connections: Unknown (  07/26/2021)   [facility]   Social Network    Social Network: Not on file  Intimate Partner Violence: Unknown (06/16/2021)   Received from [facility]   HITS    Physically Hurt: Not on file    Insult or Talk Down To: Not on file    Threaten Physical Harm: Not on file    Scream or Curse: Not on file    Review of Systems: See HPI, otherwise negative ROS  Physical Exam: Vital signs in last 24 hours: Temp:  [97.8 F (36.6 C)] 97.8 F (36.6 C) (08/13 0813) Pulse Rate:  [69] 69 (08/13 0813) Resp:  [18] 18 (08/13 0813) BP: (160)/(84) 160/84 (08/13 0813) SpO2:  [99 %] 99 % (08/13 0813) Weight:  [61.2 kg] 61.2 kg (08/13 0806)   General:   Alert,  Well-developed, well-nourished, pleasant and cooperative in NAD Head:  Normocephalic and atraumatic. Eyes:  Sclera clear, no icterus.   Conjunctiva pink. Ears:  Normal auditory acuity. Nose:  No deformity, discharge,  or lesions. Msk:  Symmetrical without gross deformities. Normal posture. Extremities:  Without clubbing or edema. Neurologic:  Alert and  oriented x4;  grossly normal neurologically. Skin:  Intact without significant lesions or rashes. Psych:  Alert and cooperative. Normal mood and affect.  Impression/Plan: Cynthia Cochran is here for a colonoscopy to be  performed for colon cancer screening purposes.  The risks of the procedure including infection, bleed, or perforation as well as benefits, limitations, alternatives and imponderables have been reviewed with the patient. Questions have been answered. All parties agreeable.

## 2023-10-24 NOTE — Op Note (Signed)
 [facility] Patient Name: Cynthia Cochran Procedure Date: 10/24/2023 8:05 AM MRN: [id number] Date of Birth: 15-Apr-1950 Attending MD: Deatrice Dine , MD, [id number] CSN: [id number] Age: 73 Admit Type: Outpatient Procedure:                Colonoscopy Indications:              Screening in patient at increased risk: Colorectal                            cancer in father 13 or older, Screening in patient                            at increased risk: Colorectal cancer in brother 73                            or older Providers:                Deatrice Dine, MD, Harlene Lips, Dorcas Lenis, Technician Referring MD:              Medicines:                Monitored Anesthesia Care Complications:            No immediate complications. Estimated Blood Loss:     Estimated blood loss: none. Procedure:                Pre-Anesthesia Assessment:                           - Prior to the procedure, a History and Physical                            was performed, and patient medications and                            allergies were reviewed. The patient's tolerance of                            previous anesthesia was also reviewed. The risks                            and benefits of the procedure and the sedation                            options and risks were discussed with the patient.                            All questions were answered, and informed consent                            was obtained. Prior Anticoagulants: The patient has  taken no anticoagulant or antiplatelet agents. ASA                            Grade Assessment: II - A patient with mild systemic                            disease. After reviewing the risks and benefits,                            the patient was deemed in satisfactory condition to                            undergo the procedure.                           After obtaining informed consent, the  colonoscope                            was passed under direct vision. Throughout the                            procedure, the patient's blood pressure, pulse, and                            oxygen saturations were monitored continuously. The                            [id number]) scope was introduced through the                            anus and advanced to the the cecum, identified by                            appendiceal orifice and ileocecal valve. The                            colonoscopy was performed without difficulty. The                            patient tolerated the procedure well. The quality                            of the bowel preparation was evaluated using the                            BBPS [facility] Bowel Preparation Scale) with scores                            of: Right Colon = 3, Transverse Colon = 3 and Left                            Colon = 3 (entire mucosa seen well with no residual  staining, small fragments of stool or opaque                            liquid). The total BBPS score equals 9. The                            ileocecal valve, appendiceal orifice, and rectum                            were photographed. Scope In: 8:33:09 AM Scope Out: 8:54:22 AM Scope Withdrawal Time: 0 hours 15 minutes 36 seconds  Total Procedure Duration: 0 hours 21 minutes 13 seconds  Findings:      The perianal and digital rectal examinations were normal.      Scattered medium-mouthed diverticula were found in the colon.      Non-bleeding internal hemorrhoids were found during retroflexion. The       hemorrhoids were small. Impression:               - Diverticulosis.                           - Non-bleeding internal hemorrhoids.                           - No specimens collected. Moderate Sedation:      Per Anesthesia Care Recommendation:           - Patient has a contact number available for                            emergencies. The  signs and symptoms of potential                            delayed complications were discussed with the                            patient. Return to normal activities tomorrow.                            Written discharge instructions were provided to the                            patient.                           - Resume previous diet.                           - Continue present medications.                           - Repeat colonoscopy in 5 years for screening                            purposes; if medically fit                           -  Return to primary care physician as previously                            scheduled. Procedure Code(s):        --- Professional ---                           H9894, Colorectal cancer screening; colonoscopy on                            individual at high risk Diagnosis Code(s):        --- Professional ---                           K64.8, Other hemorrhoids                           Z80.0, Family history of malignant neoplasm of                            digestive organs                           K57.30, Diverticulosis of large intestine without                            perforation or abscess without bleeding CPT copyright 2022 American Medical Association. All rights reserved. The codes documented in this report are preliminary and upon coder review may  be revised to meet current compliance requirements. Deatrice Dine, MD Deatrice Dine, MD 10/24/2023 9:09:55 AM This report has been signed electronically. Number of Addenda: 0

## 2023-10-24 NOTE — Transfer of Care (Signed)
 Immediate Anesthesia Transfer of Care Note  Patient: Cynthia Cochran  Procedure(s) Performed: COLONOSCOPY  Patient Location: Short Stay  Anesthesia Type:MAC  Level of Consciousness: awake, alert , oriented, and patient cooperative  Airway & Oxygen Therapy: Patient Spontanous Breathing  Post-op Assessment: Report given to RN and Post -op Vital signs reviewed and stable  Post vital signs: Reviewed and stable  Last Vitals:  Vitals Value Taken Time  BP 138/73 10/24/23 08:59  Temp 36.4 C 10/24/23 08:59  Pulse 68 10/24/23 08:59  Resp 18 10/24/23 08:59  SpO2 100 % on RA 10/24/23 08:59    Last Pain:  Vitals:   10/24/23 0859  TempSrc: Oral  PainSc: 0-No pain      Patients Stated Pain Goal: 8 (10/24/23 0806)  Complications: No notable events documented.

## 2023-10-24 NOTE — Anesthesia Postprocedure Evaluation (Signed)
 Anesthesia Post Note  Patient: Cynthia Cochran  Procedure(s) Performed: COLONOSCOPY  Patient location during evaluation: Endoscopy Anesthesia Type: General Level of consciousness: awake and alert Pain management: pain level controlled Vital Signs Assessment: post-procedure vital signs reviewed and stable Respiratory status: spontaneous breathing, nonlabored ventilation and respiratory function stable Cardiovascular status: blood pressure returned to baseline and stable Postop Assessment: no apparent nausea or vomiting Anesthetic complications: no   There were no known notable events for this encounter.   Last Vitals:  Vitals:   10/24/23 0813 10/24/23 0859  BP: (!) 160/84 138/73  Pulse: 69 68  Resp: 18 18  Temp: 36.6 C 36.4 C  SpO2: 99% 100%    Last Pain:  Vitals:   10/24/23 0859  TempSrc: Oral  PainSc: 0-No pain                 Raijon Lindfors L Dartanyon Frankowski

## 2023-10-24 NOTE — Discharge Instructions (Signed)

## 2023-10-25 ENCOUNTER — Encounter (HOSPITAL_COMMUNITY): Payer: Self-pay | Admitting: Gastroenterology

## 2023-10-26 ENCOUNTER — Ambulatory Visit: Admitting: Obstetrics and Gynecology

## 2023-10-26 ENCOUNTER — Encounter: Payer: Self-pay | Admitting: Obstetrics and Gynecology

## 2023-10-26 VITALS — BP 138/77 | HR 69

## 2023-10-26 DIAGNOSIS — Z87891 Personal history of nicotine dependence: Secondary | ICD-10-CM

## 2023-10-26 DIAGNOSIS — N3281 Overactive bladder: Secondary | ICD-10-CM

## 2023-10-26 DIAGNOSIS — N390 Urinary tract infection, site not specified: Secondary | ICD-10-CM

## 2023-10-26 MED ORDER — GEMTESA 75 MG PO TABS: 1.0000 | ORAL_TABLET | Freq: Every day | ORAL | 3 refills | Status: AC

## 2023-10-26 MED ORDER — ESTRADIOL 0.1 MG/GM VA CREA: 0.5000 g | TOPICAL_CREAM | VAGINAL | 11 refills | Status: AC

## 2023-10-26 NOTE — Patient Instructions (Signed)
 Start vaginal estrogen therapy nightly for two weeks then 2 times weekly at night for treatment of vaginal atrophy (dryness of the vaginal tissues). This also helps prevent urinary tract infections.  Please let us  know if the prescription is too expensive and we can look for alternative options.

## 2023-10-26 NOTE — Progress Notes (Signed)
 [facility] Return Visit  SUBJECTIVE  History of Present Illness: Cynthia Cochran is a 73 y.o. female seen in follow-up for incontinence. She underwent urethral bulking on 09/27/23  She has had very little leakage since the procedure, maybe one small drop. She is happy with the results.  She is emptying her bladder well. Still on the gemtesa  and this is controlling her urgency.   Has been following with Urology in [place] for recurrent UTI. Reports two recent UTIs in June and July. She is not on vaginal estrogen cream.   Past Medical History: Patient  has a past medical history of Anxiety, Arthritis, Blood transfusion without reported diagnosis (1982), Dysphagia (04/25/2016), Gastric ulcer, GERD (gastroesophageal reflux disease), Heart murmur, High cholesterol (04/25/2016), Hyperlipidemia, Hypertension, Mixed stress and urge urinary incontinence (09/09/2015), Sciatic nerve pain, and Spinal stenosis.   Past Surgical History: She  has a past surgical history that includes Cesarean section (1982); Carpal tunnel release; Total hip arthroplasty; Colonscopy; Esophagogastroduodenoscopy (N/A, 05/12/2016); Esophageal dilation (N/A, 05/12/2016); biopsy (05/12/2016); Colonoscopy with propofol  (N/A, 10/04/2018); Esophagogastroduodenoscopy (egd) with propofol  (N/A, 10/04/2018); biopsy (10/04/2018); Joint replacement; Eye surgery; Lumbar laminectomy/decompression microdiscectomy (N/A, 08/05/2020); Lumbar laminectomy/decompression microdiscectomy (N/A, 10/14/2020); Flexible sigmoidoscopy (N/A, 01/28/2021); and Colonoscopy (N/A, 10/24/2023).   Medications: She has a current medication list which includes the following prescription(s): acetaminophen , atorvastatin , doxycycline , duloxetine , repatha  sureclick, gavilyte-n with flavor pack, hydrochlorothiazide, losartan , polyethylene glycol, [START ON 10/29/2023] estradiol , and gemtesa .   Allergies: Patient is allergic to lyrica [pregabalin], mobic [meloxicam], and  nsaids.   Social History: Patient  reports that she has never smoked. She has never used smokeless tobacco. She reports current alcohol use. She reports that she does not use drugs.      OBJECTIVE     Physical Exam: Vitals:   10/26/23 1411  BP: 138/77  Pulse: 69   Gen: No apparent distress, A&O x 3.  Detailed Urogynecologic Evaluation:  Deferred.    POC urine: negative  ASSESSMENT AND PLAN    Cynthia Cochran is a 73 y.o. with:  1. Recurrent urinary tract infection   2. Overactive bladder    - Continue Gemtesa  75mg  daily. Rx provided for 90 day w/ refills.  - Start vaginal estrogen cream nightly for 2 weeks then twice a week after for urinary tract infections.   Return 1 year or sooner if needed  Rosaline LOISE Caper, MD

## 2023-12-19 ENCOUNTER — Other Ambulatory Visit: Payer: Self-pay | Admitting: Internal Medicine

## 2023-12-19 ENCOUNTER — Encounter: Payer: Self-pay | Admitting: Physician Assistant

## 2023-12-19 DIAGNOSIS — Z1231 Encounter for screening mammogram for malignant neoplasm of breast: Secondary | ICD-10-CM

## 2023-12-25 ENCOUNTER — Encounter (INDEPENDENT_AMBULATORY_CARE_PROVIDER_SITE_OTHER): Payer: Self-pay | Admitting: Gastroenterology

## 2024-01-18 ENCOUNTER — Other Ambulatory Visit: Payer: Self-pay | Admitting: Cardiovascular Disease

## 2024-02-04 ENCOUNTER — Other Ambulatory Visit (HOSPITAL_COMMUNITY): Payer: Self-pay | Admitting: Internal Medicine

## 2024-02-04 DIAGNOSIS — Z13820 Encounter for screening for osteoporosis: Secondary | ICD-10-CM

## 2024-02-08 ENCOUNTER — Ambulatory Visit (HOSPITAL_COMMUNITY)
Admission: RE | Admit: 2024-02-08 | Discharge: 2024-02-08 | Disposition: A | Discharge status: Home / Self Care | Source: Ambulatory Visit | Attending: Internal Medicine | Admitting: Internal Medicine

## 2024-02-08 ENCOUNTER — Other Ambulatory Visit (HOSPITAL_COMMUNITY): Payer: Self-pay | Admitting: Internal Medicine

## 2024-02-08 DIAGNOSIS — Z13820 Encounter for screening for osteoporosis: Secondary | ICD-10-CM

## 2024-02-08 DIAGNOSIS — Z780 Asymptomatic menopausal state: Secondary | ICD-10-CM | POA: Diagnosis not present

## 2024-02-28 ENCOUNTER — Other Ambulatory Visit: Payer: Self-pay | Admitting: Cardiovascular Disease

## 2024-03-10 ENCOUNTER — Ambulatory Visit
Admission: RE | Admit: 2024-03-10 | Discharge: 2024-03-10 | Disposition: A | Discharge status: Home / Self Care | Source: Ambulatory Visit | Attending: Internal Medicine | Admitting: Internal Medicine

## 2024-03-10 DIAGNOSIS — Z1231 Encounter for screening mammogram for malignant neoplasm of breast: Secondary | ICD-10-CM

## 2024-03-18 ENCOUNTER — Ambulatory Visit: Admitting: Physician Assistant

## 2024-03-24 ENCOUNTER — Encounter: Payer: Self-pay | Admitting: *Deleted

## 2024-04-03 NOTE — Progress Notes (Signed)
 CARDIOLOGY CONSULT NOTE       Patient ID: Cynthia Cochran MRN: [id number] DOB/AGE: 74/06/1950 73 y.o.  Admit date: (Not on file) Referring Physician: Shona Primary Physician: Verdia Lombard, MD Primary Cardiologist: Delford Reason for Consultation: Murmur   HPI:  74 y.o. referred by Dr Shona for murmur. I use to take care of her husband Lupita who passed a few years ago. Transition to being single has been rough She was on Lexapro  and Prosac but weaned and now on Pristiq for depression TTE done 11/29/21 showed normal EF and just mild AS/AR with mean gradient 13 peak 27.7 mmHg DVI 0.58 and AVA 1.7 cm2 Calcium  score was 257 , 83 rd percentile for age/sex. 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 [place] passed   Chronic back pain on codeine,flexeril  and lyrica Had repeat surgery at [facility]  last year but still with pain  Seen OB for stress incontinence and had transurethral Bulkamid injection 09/27/23    ROS All other systems reviewed and negative except as noted above  Past Medical History:  Diagnosis Date   Anxiety    Arthritis    Blood transfusion without reported diagnosis 1982   after c section   Dysphagia 04/25/2016   Gastric ulcer    GERD (gastroesophageal reflux disease)    Heart murmur    High cholesterol 04/25/2016   Hyperlipidemia    Hypertension    Mixed stress and urge urinary incontinence 09/09/2015   Sciatic nerve pain    Spinal stenosis     Family History  Problem Relation Age of Onset   Other Mother        heavy smoker; died from arterial sclerosis   Colon cancer Father 57   Hypertension Sister    Colon  cancer Brother 72   Stomach cancer Neg Hx    Breast cancer Neg Hx     Social History   Socioeconomic History   Marital status: Widowed    Spouse name: Not on file   Number of children: 0   Years of education: some college   Highest education level: Not on file  Occupational History   Occupation: Retired  Tobacco Use   Smoking status: Never   Smokeless tobacco: Never  Vaping Use   Vaping status: Never Used  Substance and Sexual Activity   Alcohol use: Yes    Comment: social drinking - a glass of wine about every 2-3 weeka   Drug use: No   Sexual activity: Not Currently    Birth control/protection: Post-menopausal, Abstinence  Other Topics Concern   Not on file  Social History Narrative   Lives alone   Caffeine use: no more than 2 cups per day    16 oz soda per day   Right handed    Social Drivers of Health   Tobacco Use: Low Risk (04/11/2024)   Patient History    Smoking Tobacco Use: Never    Smokeless Tobacco Use: Never    Passive Exposure: Not on file  Financial Resource Strain: Low Risk (04/15/2021)   Overall Financial Resource Strain (CARDIA)    Difficulty of Paying Living Expenses: Not  hard at all  Food Insecurity: No Food Insecurity (04/15/2021)   Hunger Vital Sign    Worried About Running Out of Food in the Last Year: Never true    Ran Out of Food in the Last Year: Never true  Transportation Needs: No Transportation Needs (04/15/2021)   PRAPARE - Administrator, Civil Service (Medical): No    Lack of Transportation (Non-Medical): No  Physical Activity: Insufficiently Active (04/15/2021)   Exercise Vital Sign    Days of Exercise per Week: 3 days    Minutes of Exercise per Session: 20 min  Stress: No Stress Concern Present (04/15/2021)   [employer] of Occupational Health - Occupational Stress Questionnaire    Feeling of Stress : Not at all  Social Connections: Unknown (07/26/2021)   Received from [facility]   Social Network    Social Network:  Not on file  Intimate Partner Violence: Unknown (06/16/2021)   Received from [facility]   HITS    Physically Hurt: Not on file    Insult or Talk Down To: Not on file    Threaten Physical Harm: Not on file    Scream or Curse: Not on file  Depression (PHQ2-9): Low Risk (04/15/2021)   Depression (PHQ2-9)    PHQ-2 Score: 0  Alcohol Screen: Low Risk (04/15/2021)   Alcohol Screen    Last Alcohol Screening Score (AUDIT): 2  Housing: Low Risk (04/15/2021)   Housing    Last Housing Risk Score: 0  Utilities: Not on file  Health Literacy: Not on file    Past Surgical History:  Procedure Laterality Date   BIOPSY  05/12/2016   Procedure: BIOPSY;  Surgeon: Claudis RAYMOND Rivet, MD;  [facility];  Service: Endoscopy;;  gastric   BIOPSY  10/04/2018   Procedure: BIOPSY;  Surgeon: Rivet Claudis RAYMOND, MD;  [facility];  Service: Endoscopy;;  gastric    CARPAL TUNNEL RELEASE     right; 1998, left 1998   CESAREAN SECTION  1982   COLONOSCOPY N/A 10/24/2023   Procedure: COLONOSCOPY;  Surgeon: Cinderella Deatrice FALCON, MD;  [facility];  Service: Endoscopy;  Laterality: N/A;  730am, asa 2   COLONOSCOPY WITH PROPOFOL  N/A 10/04/2018   Procedure: COLONOSCOPY WITH PROPOFOL ;  Surgeon: Rivet Claudis RAYMOND, MD;  [facility];  Service: Endoscopy;  Laterality: N/A;  7:30   Colonscopy     ESOPHAGEAL DILATION N/A 05/12/2016   Procedure: ESOPHAGEAL DILATION;  Surgeon: Claudis RAYMOND Rivet, MD;  [facility];  Service: Endoscopy;  Laterality: N/A;   ESOPHAGOGASTRODUODENOSCOPY N/A 05/12/2016   Procedure: ESOPHAGOGASTRODUODENOSCOPY (EGD);  Surgeon: Claudis RAYMOND Rivet, MD;  [facility];  Service: Endoscopy;  Laterality: N/A;  9:30   ESOPHAGOGASTRODUODENOSCOPY (EGD) WITH PROPOFOL  N/A 10/04/2018   Procedure: ESOPHAGOGASTRODUODENOSCOPY (EGD) WITH PROPOFOL ;  Surgeon: Rivet Claudis RAYMOND, MD;  [facility];  Service: Endoscopy;  Laterality: N/A;   EYE SURGERY     lasix   FLEXIBLE  SIGMOIDOSCOPY N/A 01/28/2021   Procedure: FLEXIBLE SIGMOIDOSCOPY WITH PROPOFOL ;  Surgeon: Rivet Claudis RAYMOND, MD;  [facility];  Service: Endoscopy;  Laterality: N/A;  9:30   JOINT REPLACEMENT     both hips   LUMBAR LAMINECTOMY/DECOMPRESSION MICRODISCECTOMY N/A 08/05/2020   Procedure: LAMINECTOMY THORACIC TEN-ELEVEN;  Surgeon: Gillie Duncans, MD;  Location: [facility];  Service: Neurosurgery;  Laterality: N/A;   LUMBAR LAMINECTOMY/DECOMPRESSION MICRODISCECTOMY N/A 10/14/2020   Procedure: Right Lumbar 4-5, Lumbar 5 Sacral  1 Lumbar and thoracic laminectomy;  Surgeon: Gillie Duncans, MD;  Location: [facility] OR;  Service: Neurosurgery;  Laterality: N/A;  Right Lumbar 4-5, Lumbar 5 Sacral 1 Lumbar and thoracic laminectomy   TOTAL HIP ARTHROPLASTY     right; March 2013; left March 2011      Current Outpatient Medications:    acetaminophen  (TYLENOL ) 650 MG CR tablet, Take 1,300 mg by mouth every 8 (eight) hours as needed for pain., Disp: , Rfl:    atorvastatin  (LIPITOR ) 80 MG tablet, Take 1 tablet by mouth once daily, Disp: 90 tablet, Rfl: 0   estradiol  (ESTRACE ) 0.1 MG/GM vaginal cream, Place 0.5 g vaginally 2 (two) times a week. Place 0.5g nightly for two weeks then twice a week after, Disp: 42.5 g, Rfl: 11   GEMTESA  75 MG TABS, Take 1 tablet (75 mg total) by mouth daily., Disp: 90 tablet, Rfl: 3   hydrochlorothiazide (MICROZIDE) 12.5 MG capsule, Take 12.5 mg by mouth daily., Disp: , Rfl:    losartan  (COZAAR ) 100 MG tablet, Take 100 mg by mouth daily., Disp: , Rfl:    Evolocumab  (REPATHA  SURECLICK) 140 MG/ML SOAJ, Inject 140 mg into the skin every 14 (fourteen) days. Please call and schedule appointment for further refills, Disp: 6 mL, Rfl: 0    Physical Exam: Blood pressure 124/64, pulse 74, height 5' 2 (1.575 m), weight 145 lb 3.2 oz (65.9 kg), SpO2 95%.   Home BP readings ok being followed by primary  Affect appropriate Healthy:  appears stated age HEENT: normal Neck supple with no  adenopathy JVP normal no bruits no thyromegaly Lungs clear with no wheezing and good diaphragmatic motion Heart:  S1/S2 mild AS/AR murmur, no rub, gallop or click PMI normal Abdomen: benighn, BS positve, no tenderness, no AAA no bruit.  No HSM or HJR Distal pulses intact with no bruits No edema Neuro non-focal Skin warm and dry Post bilateral THR   Labs:   Lab Results  Component Value Date   WBC 6.7 12/14/2021   HGB 12.2 12/14/2021   HCT 37.8 12/14/2021   MCV 86.9 12/14/2021   PLT 307 12/14/2021    No results for input(s): NA, K, CL, CO2, BUN, CREATININE, CALCIUM , PROT, BILITOT, ALKPHOS, ALT, AST, GLUCOSE in the last 168 hours.  Invalid input(s): LABALBU  Lab Results  Component Value Date   TROPONINI <0.03 04/08/2018    Lab Results  Component Value Date   CHOL 196 01/18/2023   CHOL 209 (H) 01/02/2022   Lab Results  Component Value Date   HDL 49 01/18/2023   HDL 43 01/02/2022   Lab Results  Component Value Date   LDLCALC 132 (H) 01/18/2023   LDLCALC 131 (H) 01/02/2022   Lab Results  Component Value Date   TRIG 73 01/18/2023   TRIG 196 (H) 01/02/2022   Lab Results  Component Value Date   CHOLHDL 4.0 01/18/2023   CHOLHDL 4.9 (H) 01/02/2022   No results found for: LDLDIRECT    Radiology: No results found.   EKG: 01/03/22 SR rate 62 low voltage normal   ASSESSMENT AND PLAN:   CAD: Risk high calcium  score for age no chest pain normal ECG continue statin Labs with primary Bruising:  UE;s f/u oncology labs normal meds for neuropathy and depression adjusted Depression: since husbands death now on Pristiq AS: mild with mild AR f/u echo  ordered  HLD:  LDL 131 Triglycerides 196 89/76/76 Zetia  added to high dose lipitor  f/u labs  and refer to lipid clinic  May need to change to PSK9 HTN:  worse when her back hurts continue Microzide/Losartan  home readings ok  Sciatica:  right sided PT Duplex negative for DVT on right 12/18/22  Repeat surgery at [facility] not successful  Stress Incontinence:  post Bulkamid injection and on Gemtesa  Improved   Echo for AV dx   F/U in a year   Signed: Maude Emmer 04/11/2024, 10:29 AM

## 2024-04-11 ENCOUNTER — Encounter: Payer: Self-pay | Admitting: Cardiovascular Disease

## 2024-04-11 ENCOUNTER — Ambulatory Visit: Attending: Cardiovascular Disease | Admitting: Cardiovascular Disease

## 2024-04-11 ENCOUNTER — Other Ambulatory Visit (HOSPITAL_COMMUNITY): Payer: Self-pay

## 2024-04-11 ENCOUNTER — Telehealth: Payer: Self-pay | Admitting: Pharmacy Technician

## 2024-04-11 VITALS — BP 124/64 | HR 74 | Ht 62.0 in | Wt 145.2 lb

## 2024-04-11 DIAGNOSIS — I10 Essential (primary) hypertension: Secondary | ICD-10-CM

## 2024-04-11 DIAGNOSIS — R931 Abnormal findings on diagnostic imaging of heart and coronary circulation: Secondary | ICD-10-CM | POA: Diagnosis not present

## 2024-04-11 DIAGNOSIS — I350 Nonrheumatic aortic (valve) stenosis: Secondary | ICD-10-CM

## 2024-04-11 DIAGNOSIS — E782 Mixed hyperlipidemia: Secondary | ICD-10-CM

## 2024-04-11 MED ORDER — REPATHA SURECLICK 140 MG/ML ~~LOC~~ SOAJ
140.0000 mg | SUBCUTANEOUS | 0 refills | Status: DC
  Filled 2024-04-11: qty 2, 28d supply, fill #0
  Filled 2024-04-14: qty 2, 28d supply, fill #1

## 2024-04-11 NOTE — Patient Instructions (Signed)
 Medication Instructions:  Your physician recommends that you continue on your current medications as directed. Please refer to the Current Medication list given to you today.  *If you need a refill on your cardiac medications before your next appointment, please call your pharmacy*  Lab Work: None ordered If you have labs (blood work) drawn today and your tests are completely normal, you will receive your results only by: MyChart Message (if you have MyChart) OR A paper copy in the mail If you have any lab test that is abnormal or we need to change your treatment, we will call you to review the results.  Testing/Procedures: ECHOCARDIOGRAM  Follow-Up: At [facility], you and your health needs are our priority.  As part of our continuing mission to provide you with exceptional heart care, our providers are all part of one team.  This team includes your primary Cardiologist (physician) and Advanced Practice Providers or APPs (Physician Assistants and Nurse Practitioners) who all work together to provide you with the care you need, when you need it.  Your next appointment:   1 year(s)  Provider:   Maude Emmer, MD    We recommend signing up for the patient portal called MyChart.  Sign up information is provided on this After Visit Summary.  MyChart is used to connect with patients for Virtual Visits (Telemedicine).  Patients are able to view lab/test results, encounter notes, upcoming appointments, etc.  Non-urgent messages can be sent to your provider as well.   To learn more about what you can do with MyChart, go to [email or web address].   Other Instructions Your physician has requested that you have an echocardiogram. Echocardiography is a painless test that uses sound waves to create images of your heart. It provides your doctor with information about the size and shape of your heart and how well your hearts chambers and valves are working. This procedure takes  approximately one hour. There are no restrictions for this procedure. Please do NOT wear cologne, perfume, aftershave, or lotions (deodorant is allowed). Please arrive 15 minutes prior to your appointment time.  Please note: We ask at that you not bring children with you during ultrasound (echo/ vascular) testing. Due to room size and safety concerns, children are not allowed in the ultrasound rooms during exams. Our front office staff cannot provide observation of children in our lobby area while testing is being conducted. An adult accompanying a patient to their appointment will only be allowed in the ultrasound room at the discretion of the ultrasound technician under special circumstances. We apologize for any inconvenience.

## 2024-04-11 NOTE — Telephone Encounter (Signed)
 Patient Advocate Encounter   The patient was approved for a [employer] grant that will help cover the cost of repatha  Total amount awarded, 2500.  Effective: 03/12/24 - 03/11/25   APW:[id number] ERW:[id number] Hmnle:[id number] PI:[id number] [employer] ID: [id number]   Pharmacy provided with approval and processing information. Patient informed via patient

## 2024-04-14 ENCOUNTER — Other Ambulatory Visit: Payer: Self-pay | Admitting: Cardiovascular Disease

## 2024-04-14 ENCOUNTER — Encounter: Payer: Self-pay | Admitting: Cardiovascular Disease

## 2024-04-14 ENCOUNTER — Other Ambulatory Visit (HOSPITAL_COMMUNITY): Payer: Self-pay

## 2024-04-14 DIAGNOSIS — E782 Mixed hyperlipidemia: Secondary | ICD-10-CM

## 2024-04-14 MED ORDER — REPATHA SURECLICK 140 MG/ML ~~LOC~~ SOAJ: 140.0000 mg | SUBCUTANEOUS | 1 refills | Status: AC

## 2024-04-16 ENCOUNTER — Other Ambulatory Visit (HOSPITAL_COMMUNITY): Payer: Self-pay

## 2024-04-16 ENCOUNTER — Telehealth: Payer: Self-pay | Admitting: Cardiovascular Disease

## 2024-04-16 NOTE — Telephone Encounter (Signed)
 I called [facility] and it is too soon until 05/02/24. [facility] has insurance and the grant.   I called the patient and she is aware she can get around the 20th at [facility]

## 2024-04-16 NOTE — Telephone Encounter (Signed)
 Pt c/o medication issue:  1. Name of Medication:   Evolocumab  (REPATHA  SURECLICK) 140 MG/ML SOAJ   2. How are you currently taking this medication (dosage and times per day)?   As prescribed  3. Are you having a reaction (difficulty breathing--STAT)?   4. What is your medication issue?   Patient stated she is completely out of this medication and wants to re-new her grant for this medication. Patient stated she also sent a message in MyChart.

## 2024-05-21 ENCOUNTER — Ambulatory Visit (HOSPITAL_COMMUNITY)
